# Patient Record
Sex: Male | Born: 1950
Health system: Southern US, Community
[De-identification: ages and names within clinical notes are randomized; demographics above are authoritative.]

## PROBLEM LIST (undated history)

## (undated) DIAGNOSIS — C801 Malignant (primary) neoplasm, unspecified: Secondary | ICD-10-CM

## (undated) DIAGNOSIS — M272 Inflammatory conditions of jaws: Secondary | ICD-10-CM

## (undated) DIAGNOSIS — R682 Dry mouth, unspecified: Secondary | ICD-10-CM

## (undated) DIAGNOSIS — K219 Gastro-esophageal reflux disease without esophagitis: Secondary | ICD-10-CM

## (undated) DIAGNOSIS — F32A Depression, unspecified: Secondary | ICD-10-CM

## (undated) DIAGNOSIS — R39198 Other difficulties with micturition: Secondary | ICD-10-CM

## (undated) DIAGNOSIS — F329 Major depressive disorder, single episode, unspecified: Secondary | ICD-10-CM

## (undated) DIAGNOSIS — M4302 Spondylolysis, cervical region: Secondary | ICD-10-CM

## (undated) DIAGNOSIS — R21 Rash and other nonspecific skin eruption: Secondary | ICD-10-CM

## (undated) DIAGNOSIS — G8929 Other chronic pain: Secondary | ICD-10-CM

## (undated) DIAGNOSIS — E1165 Type 2 diabetes mellitus with hyperglycemia: Secondary | ICD-10-CM

## (undated) DIAGNOSIS — N390 Urinary tract infection, site not specified: Secondary | ICD-10-CM

## (undated) DIAGNOSIS — R499 Unspecified voice and resonance disorder: Secondary | ICD-10-CM

## (undated) DIAGNOSIS — I10 Essential (primary) hypertension: Secondary | ICD-10-CM

## (undated) DIAGNOSIS — T7840XA Allergy, unspecified, initial encounter: Secondary | ICD-10-CM

## (undated) DIAGNOSIS — R1310 Dysphagia, unspecified: Secondary | ICD-10-CM

## (undated) DIAGNOSIS — R7302 Impaired glucose tolerance (oral): Secondary | ICD-10-CM

## (undated) DIAGNOSIS — M47812 Spondylosis without myelopathy or radiculopathy, cervical region: Secondary | ICD-10-CM

## (undated) DIAGNOSIS — IMO0002 Reserved for concepts with insufficient information to code with codable children: Secondary | ICD-10-CM

## (undated) DIAGNOSIS — Y842 Radiological procedure and radiotherapy as the cause of abnormal reaction of the patient, or of later complication, without mention of misadventure at the time of the procedure: Secondary | ICD-10-CM

## (undated) HISTORY — DX: Reserved for concepts with insufficient information to code with codable children: IMO0002

## (undated) HISTORY — PX: GASTROSTOMY TUBE PLACEMENT: SHX655

## (undated) HISTORY — DX: Type 2 diabetes mellitus with hyperglycemia: E11.65

## (undated) HISTORY — DX: Impaired glucose tolerance (oral): R73.02

## (undated) HISTORY — DX: Rash and other nonspecific skin eruption: R21

## (undated) HISTORY — DX: Allergy, unspecified, initial encounter: T78.40XA

## (undated) HISTORY — DX: Malignant (primary) neoplasm, unspecified: C80.1

## (undated) HISTORY — DX: Essential (primary) hypertension: I10

## (undated) HISTORY — DX: Urinary tract infection, site not specified: N39.0

## (undated) HISTORY — DX: Gastro-esophageal reflux disease without esophagitis: K21.9

## (undated) HISTORY — DX: Radiological procedure and radiotherapy as the cause of abnormal reaction of the patient, or of later complication, without mention of misadventure at the time of the procedure: Y84.2

## (undated) HISTORY — DX: Dry mouth, unspecified: R68.2

## (undated) HISTORY — DX: Depression, unspecified: F32.A

## (undated) HISTORY — DX: Spondylolysis, cervical region: M43.02

## (undated) HISTORY — DX: Other chronic pain: G89.29

## (undated) HISTORY — DX: Unspecified voice and resonance disorder: R49.9

## (undated) HISTORY — DX: Major depressive disorder, single episode, unspecified: F32.9

## (undated) HISTORY — DX: Other difficulties with micturition: R39.198

## (undated) HISTORY — DX: Inflammatory conditions of jaws: M27.2

## (undated) HISTORY — DX: Dysphagia, unspecified: R13.10

## (undated) HISTORY — DX: Spondylosis without myelopathy or radiculopathy, cervical region: M47.812

---

## 1997-02-13 DIAGNOSIS — C801 Malignant (primary) neoplasm, unspecified: Secondary | ICD-10-CM

## 1997-02-13 HISTORY — DX: Malignant (primary) neoplasm, unspecified: C80.1

## 1999-11-14 HISTORY — PX: TRACHEOSTOMY: SUR1362

## 1999-11-14 HISTORY — PX: PEG TUBE PLACEMENT: SUR1034

## 1999-11-24 ENCOUNTER — Emergency Department (HOSPITAL_COMMUNITY): Admission: EM | Admit: 1999-11-24 | Discharge: 1999-11-25 | Payer: Self-pay | Admitting: Emergency Medicine

## 1999-11-26 ENCOUNTER — Emergency Department (HOSPITAL_COMMUNITY): Admission: EM | Admit: 1999-11-26 | Discharge: 1999-11-26 | Payer: Self-pay | Admitting: Internal Medicine

## 1999-12-06 ENCOUNTER — Inpatient Hospital Stay (HOSPITAL_COMMUNITY): Admission: EM | Admit: 1999-12-06 | Discharge: 1999-12-20 | Payer: Self-pay | Admitting: Emergency Medicine

## 1999-12-06 ENCOUNTER — Encounter (INDEPENDENT_AMBULATORY_CARE_PROVIDER_SITE_OTHER): Payer: Self-pay | Admitting: Specialist

## 1999-12-06 ENCOUNTER — Encounter: Payer: Self-pay | Admitting: Otolaryngology

## 1999-12-08 ENCOUNTER — Encounter: Payer: Self-pay | Admitting: Otolaryngology

## 1999-12-11 ENCOUNTER — Encounter: Payer: Self-pay | Admitting: Otolaryngology

## 1999-12-15 ENCOUNTER — Encounter: Payer: Self-pay | Admitting: Otolaryngology

## 2000-01-23 ENCOUNTER — Encounter: Payer: Self-pay | Admitting: Otolaryngology

## 2000-01-23 ENCOUNTER — Ambulatory Visit (HOSPITAL_COMMUNITY): Admission: RE | Admit: 2000-01-23 | Discharge: 2000-01-23 | Payer: Self-pay | Admitting: Otolaryngology

## 2000-06-18 ENCOUNTER — Emergency Department (HOSPITAL_COMMUNITY): Admission: EM | Admit: 2000-06-18 | Discharge: 2000-06-19 | Payer: Self-pay | Admitting: Emergency Medicine

## 2000-09-28 ENCOUNTER — Encounter: Admission: RE | Admit: 2000-09-28 | Discharge: 2000-09-28 | Payer: Self-pay | Admitting: Otolaryngology

## 2000-09-28 ENCOUNTER — Encounter: Payer: Self-pay | Admitting: Otolaryngology

## 2000-11-15 ENCOUNTER — Encounter (INDEPENDENT_AMBULATORY_CARE_PROVIDER_SITE_OTHER): Payer: Self-pay | Admitting: Specialist

## 2000-11-15 ENCOUNTER — Ambulatory Visit (HOSPITAL_BASED_OUTPATIENT_CLINIC_OR_DEPARTMENT_OTHER): Admission: RE | Admit: 2000-11-15 | Discharge: 2000-11-15 | Payer: Self-pay | Admitting: General Surgery

## 2000-11-22 ENCOUNTER — Emergency Department (HOSPITAL_COMMUNITY): Admission: EM | Admit: 2000-11-22 | Discharge: 2000-11-22 | Payer: Self-pay | Admitting: Emergency Medicine

## 2000-11-22 ENCOUNTER — Emergency Department (HOSPITAL_COMMUNITY): Admission: EM | Admit: 2000-11-22 | Discharge: 2000-11-22 | Payer: Self-pay

## 2001-05-22 ENCOUNTER — Emergency Department (HOSPITAL_COMMUNITY): Admission: EM | Admit: 2001-05-22 | Discharge: 2001-05-22 | Payer: Self-pay | Admitting: Emergency Medicine

## 2001-07-09 ENCOUNTER — Ambulatory Visit (HOSPITAL_COMMUNITY): Admission: RE | Admit: 2001-07-09 | Discharge: 2001-07-09 | Payer: Self-pay | Admitting: Pulmonary Disease

## 2001-07-09 ENCOUNTER — Encounter: Payer: Self-pay | Admitting: Pulmonary Disease

## 2001-09-03 ENCOUNTER — Ambulatory Visit (HOSPITAL_COMMUNITY): Admission: RE | Admit: 2001-09-03 | Discharge: 2001-09-03 | Payer: Self-pay | Admitting: Gastroenterology

## 2001-09-03 ENCOUNTER — Encounter: Payer: Self-pay | Admitting: Gastroenterology

## 2001-11-20 ENCOUNTER — Ambulatory Visit (HOSPITAL_COMMUNITY): Admission: RE | Admit: 2001-11-20 | Discharge: 2001-11-20 | Payer: Self-pay | Admitting: Otolaryngology

## 2001-11-20 ENCOUNTER — Encounter: Payer: Self-pay | Admitting: Otolaryngology

## 2001-12-07 ENCOUNTER — Emergency Department (HOSPITAL_COMMUNITY): Admission: EM | Admit: 2001-12-07 | Discharge: 2001-12-07 | Payer: Self-pay | Admitting: Emergency Medicine

## 2002-01-10 ENCOUNTER — Emergency Department (HOSPITAL_COMMUNITY): Admission: EM | Admit: 2002-01-10 | Discharge: 2002-01-10 | Payer: Self-pay | Admitting: Emergency Medicine

## 2002-01-10 ENCOUNTER — Encounter: Payer: Self-pay | Admitting: Emergency Medicine

## 2002-01-16 ENCOUNTER — Ambulatory Visit (HOSPITAL_COMMUNITY): Admission: RE | Admit: 2002-01-16 | Discharge: 2002-01-16 | Payer: Self-pay | Admitting: Gastroenterology

## 2002-03-15 ENCOUNTER — Emergency Department (HOSPITAL_COMMUNITY): Admission: EM | Admit: 2002-03-15 | Discharge: 2002-03-15 | Payer: Self-pay | Admitting: *Deleted

## 2002-03-28 ENCOUNTER — Ambulatory Visit (HOSPITAL_COMMUNITY): Admission: RE | Admit: 2002-03-28 | Discharge: 2002-03-28 | Payer: Self-pay | Admitting: Otolaryngology

## 2002-03-28 ENCOUNTER — Encounter: Payer: Self-pay | Admitting: Otolaryngology

## 2002-05-08 ENCOUNTER — Encounter: Payer: Self-pay | Admitting: Emergency Medicine

## 2002-05-08 ENCOUNTER — Emergency Department (HOSPITAL_COMMUNITY): Admission: EM | Admit: 2002-05-08 | Discharge: 2002-05-08 | Payer: Self-pay | Admitting: Emergency Medicine

## 2002-05-10 ENCOUNTER — Emergency Department (HOSPITAL_COMMUNITY): Admission: EM | Admit: 2002-05-10 | Discharge: 2002-05-10 | Payer: Self-pay | Admitting: Emergency Medicine

## 2002-05-15 ENCOUNTER — Ambulatory Visit (HOSPITAL_COMMUNITY): Admission: RE | Admit: 2002-05-15 | Discharge: 2002-05-15 | Payer: Self-pay | Admitting: Gastroenterology

## 2002-05-16 ENCOUNTER — Emergency Department (HOSPITAL_COMMUNITY): Admission: EM | Admit: 2002-05-16 | Discharge: 2002-05-16 | Payer: Self-pay | Admitting: Emergency Medicine

## 2002-05-20 ENCOUNTER — Encounter: Admission: RE | Admit: 2002-05-20 | Discharge: 2002-05-20 | Payer: Self-pay | Admitting: Gastroenterology

## 2002-05-20 ENCOUNTER — Encounter: Payer: Self-pay | Admitting: Gastroenterology

## 2002-06-18 ENCOUNTER — Ambulatory Visit (HOSPITAL_COMMUNITY): Admission: RE | Admit: 2002-06-18 | Discharge: 2002-06-18 | Payer: Self-pay | Admitting: General Surgery

## 2002-08-17 ENCOUNTER — Emergency Department (HOSPITAL_COMMUNITY): Admission: EM | Admit: 2002-08-17 | Discharge: 2002-08-17 | Payer: Self-pay | Admitting: Emergency Medicine

## 2002-08-18 ENCOUNTER — Emergency Department (HOSPITAL_COMMUNITY): Admission: AD | Admit: 2002-08-18 | Discharge: 2002-08-18 | Payer: Self-pay | Admitting: Emergency Medicine

## 2002-08-18 ENCOUNTER — Encounter: Payer: Self-pay | Admitting: Emergency Medicine

## 2002-12-03 ENCOUNTER — Emergency Department (HOSPITAL_COMMUNITY): Admission: EM | Admit: 2002-12-03 | Discharge: 2002-12-04 | Payer: Self-pay | Admitting: Emergency Medicine

## 2002-12-06 ENCOUNTER — Emergency Department (HOSPITAL_COMMUNITY): Admission: EM | Admit: 2002-12-06 | Discharge: 2002-12-06 | Payer: Self-pay | Admitting: Emergency Medicine

## 2002-12-06 ENCOUNTER — Encounter: Payer: Self-pay | Admitting: Emergency Medicine

## 2003-01-06 ENCOUNTER — Encounter: Admission: RE | Admit: 2003-01-06 | Discharge: 2003-01-06 | Payer: Self-pay | Admitting: Otolaryngology

## 2003-06-28 ENCOUNTER — Emergency Department (HOSPITAL_COMMUNITY): Admission: EM | Admit: 2003-06-28 | Discharge: 2003-06-28 | Payer: Self-pay | Admitting: Emergency Medicine

## 2003-10-12 ENCOUNTER — Emergency Department (HOSPITAL_COMMUNITY): Admission: EM | Admit: 2003-10-12 | Discharge: 2003-10-12 | Payer: Self-pay | Admitting: Emergency Medicine

## 2004-06-06 ENCOUNTER — Encounter: Admission: RE | Admit: 2004-06-06 | Discharge: 2004-06-06 | Payer: Self-pay | Admitting: Otolaryngology

## 2004-08-09 ENCOUNTER — Encounter (HOSPITAL_BASED_OUTPATIENT_CLINIC_OR_DEPARTMENT_OTHER): Admission: RE | Admit: 2004-08-09 | Discharge: 2004-11-07 | Payer: Self-pay | Admitting: Surgery

## 2004-11-13 ENCOUNTER — Emergency Department (HOSPITAL_COMMUNITY): Admission: EM | Admit: 2004-11-13 | Discharge: 2004-11-13 | Payer: Self-pay | Admitting: Emergency Medicine

## 2004-11-16 ENCOUNTER — Encounter (HOSPITAL_BASED_OUTPATIENT_CLINIC_OR_DEPARTMENT_OTHER): Admission: RE | Admit: 2004-11-16 | Discharge: 2004-12-12 | Payer: Self-pay | Admitting: Surgery

## 2006-04-22 ENCOUNTER — Emergency Department (HOSPITAL_COMMUNITY): Admission: EM | Admit: 2006-04-22 | Discharge: 2006-04-22 | Payer: Self-pay | Admitting: Emergency Medicine

## 2006-04-23 ENCOUNTER — Emergency Department (HOSPITAL_COMMUNITY): Admission: EM | Admit: 2006-04-23 | Discharge: 2006-04-23 | Payer: Self-pay | Admitting: Emergency Medicine

## 2006-05-06 ENCOUNTER — Emergency Department (HOSPITAL_COMMUNITY): Admission: EM | Admit: 2006-05-06 | Discharge: 2006-05-06 | Payer: Self-pay | Admitting: Emergency Medicine

## 2006-10-01 ENCOUNTER — Encounter (HOSPITAL_BASED_OUTPATIENT_CLINIC_OR_DEPARTMENT_OTHER): Admission: RE | Admit: 2006-10-01 | Discharge: 2006-12-30 | Payer: Self-pay | Admitting: Surgery

## 2007-04-30 ENCOUNTER — Encounter (HOSPITAL_BASED_OUTPATIENT_CLINIC_OR_DEPARTMENT_OTHER): Admission: RE | Admit: 2007-04-30 | Discharge: 2007-07-29 | Payer: Self-pay | Admitting: Surgery

## 2007-10-29 ENCOUNTER — Encounter: Admission: RE | Admit: 2007-10-29 | Discharge: 2007-10-29 | Payer: Self-pay | Admitting: *Deleted

## 2007-11-28 ENCOUNTER — Emergency Department (HOSPITAL_COMMUNITY): Admission: EM | Admit: 2007-11-28 | Discharge: 2007-11-28 | Payer: Self-pay | Admitting: Family Medicine

## 2008-06-24 ENCOUNTER — Emergency Department (HOSPITAL_COMMUNITY): Admission: EM | Admit: 2008-06-24 | Discharge: 2008-06-24 | Payer: Self-pay | Admitting: Emergency Medicine

## 2008-06-26 ENCOUNTER — Emergency Department (HOSPITAL_COMMUNITY): Admission: EM | Admit: 2008-06-26 | Discharge: 2008-06-27 | Payer: Self-pay | Admitting: Emergency Medicine

## 2008-09-29 ENCOUNTER — Encounter: Admission: RE | Admit: 2008-09-29 | Discharge: 2008-09-29 | Payer: Self-pay | Admitting: Family Medicine

## 2009-04-13 ENCOUNTER — Emergency Department (HOSPITAL_COMMUNITY): Admission: EM | Admit: 2009-04-13 | Discharge: 2009-04-13 | Payer: Self-pay | Admitting: Family Medicine

## 2009-08-24 ENCOUNTER — Emergency Department (HOSPITAL_COMMUNITY): Admission: EM | Admit: 2009-08-24 | Discharge: 2009-08-24 | Payer: Self-pay | Admitting: Emergency Medicine

## 2009-08-25 ENCOUNTER — Encounter: Admission: RE | Admit: 2009-08-25 | Discharge: 2009-08-25 | Payer: Self-pay | Admitting: Family Medicine

## 2009-08-31 ENCOUNTER — Emergency Department (HOSPITAL_COMMUNITY): Admission: EM | Admit: 2009-08-31 | Discharge: 2009-08-31 | Payer: Self-pay | Admitting: Family Medicine

## 2009-09-21 ENCOUNTER — Emergency Department (HOSPITAL_COMMUNITY): Admission: EM | Admit: 2009-09-21 | Discharge: 2009-09-21 | Payer: Self-pay | Admitting: Emergency Medicine

## 2009-11-16 ENCOUNTER — Emergency Department (HOSPITAL_COMMUNITY)
Admission: EM | Admit: 2009-11-16 | Discharge: 2009-11-16 | Payer: Self-pay | Source: Home / Self Care | Admitting: Emergency Medicine

## 2009-11-17 ENCOUNTER — Emergency Department (HOSPITAL_COMMUNITY): Admission: EM | Admit: 2009-11-17 | Discharge: 2009-11-17 | Payer: Self-pay | Admitting: Emergency Medicine

## 2009-11-17 ENCOUNTER — Emergency Department (HOSPITAL_COMMUNITY): Admission: EM | Admit: 2009-11-17 | Discharge: 2009-11-18 | Payer: Self-pay | Admitting: Emergency Medicine

## 2009-11-18 ENCOUNTER — Ambulatory Visit (HOSPITAL_COMMUNITY): Admission: RE | Admit: 2009-11-18 | Discharge: 2009-11-18 | Payer: Self-pay | Admitting: Interventional Radiology

## 2010-03-12 ENCOUNTER — Emergency Department (HOSPITAL_COMMUNITY)
Admission: EM | Admit: 2010-03-12 | Discharge: 2010-03-12 | Payer: Self-pay | Source: Home / Self Care | Admitting: Emergency Medicine

## 2010-04-27 LAB — DIFFERENTIAL
Basophils Absolute: 0 10*3/uL (ref 0.0–0.1)
Basophils Relative: 0 % (ref 0–1)
Eosinophils Absolute: 0.1 10*3/uL (ref 0.0–0.7)
Eosinophils Relative: 2 % (ref 0–5)
Lymphocytes Relative: 29 % (ref 12–46)
Lymphs Abs: 1.7 10*3/uL (ref 0.7–4.0)
Monocytes Absolute: 0.9 10*3/uL (ref 0.1–1.0)
Monocytes Relative: 15 % — ABNORMAL HIGH (ref 3–12)
Neutro Abs: 3.1 10*3/uL (ref 1.7–7.7)
Neutrophils Relative %: 53 % (ref 43–77)

## 2010-04-27 LAB — CBC
HCT: 43.6 % (ref 39.0–52.0)
Hemoglobin: 15.1 g/dL (ref 13.0–17.0)
MCH: 32 pg (ref 26.0–34.0)
MCHC: 34.6 g/dL (ref 30.0–36.0)
MCV: 92.4 fL (ref 78.0–100.0)
Platelets: 238 10*3/uL (ref 150–400)
RBC: 4.72 MIL/uL (ref 4.22–5.81)
RDW: 12.7 % (ref 11.5–15.5)
WBC: 5.9 10*3/uL (ref 4.0–10.5)

## 2010-04-27 LAB — URINE MICROSCOPIC-ADD ON

## 2010-04-27 LAB — URINALYSIS, ROUTINE W REFLEX MICROSCOPIC
Bilirubin Urine: NEGATIVE
Glucose, UA: NEGATIVE mg/dL
Hgb urine dipstick: NEGATIVE
Ketones, ur: NEGATIVE mg/dL
Nitrite: NEGATIVE
Protein, ur: NEGATIVE mg/dL
Specific Gravity, Urine: 1.024 (ref 1.005–1.030)
Urobilinogen, UA: 0.2 mg/dL (ref 0.0–1.0)
pH: 6 (ref 5.0–8.0)

## 2010-04-27 LAB — POCT I-STAT, CHEM 8
BUN: 18 mg/dL (ref 6–23)
Calcium, Ion: 1.04 mmol/L — ABNORMAL LOW (ref 1.12–1.32)
Chloride: 98 mEq/L (ref 96–112)
Creatinine, Ser: 1.1 mg/dL (ref 0.4–1.5)
Glucose, Bld: 117 mg/dL — ABNORMAL HIGH (ref 70–99)
HCT: 48 % (ref 39.0–52.0)
Hemoglobin: 16.3 g/dL (ref 13.0–17.0)
Potassium: 3.9 mEq/L (ref 3.5–5.1)
Sodium: 135 mEq/L (ref 135–145)
TCO2: 30 mmol/L (ref 0–100)

## 2010-04-30 LAB — POCT I-STAT, CHEM 8
BUN: 18 mg/dL (ref 6–23)
Calcium, Ion: 1.17 mmol/L (ref 1.12–1.32)
Chloride: 96 mEq/L (ref 96–112)
Creatinine, Ser: 0.9 mg/dL (ref 0.4–1.5)
Glucose, Bld: 119 mg/dL — ABNORMAL HIGH (ref 70–99)
HCT: 46 % (ref 39.0–52.0)
Hemoglobin: 15.6 g/dL (ref 13.0–17.0)
Potassium: 3.8 mEq/L (ref 3.5–5.1)
Sodium: 136 mEq/L (ref 135–145)
TCO2: 32 mmol/L (ref 0–100)

## 2010-05-06 ENCOUNTER — Other Ambulatory Visit: Payer: Self-pay | Admitting: Family Medicine

## 2010-05-06 ENCOUNTER — Ambulatory Visit
Admission: RE | Admit: 2010-05-06 | Discharge: 2010-05-06 | Disposition: A | Discharge status: Home / Self Care | Payer: Medicare Other | Source: Ambulatory Visit | Attending: Family Medicine | Admitting: Family Medicine

## 2010-05-06 DIAGNOSIS — Z85819 Personal history of malignant neoplasm of unspecified site of lip, oral cavity, and pharynx: Secondary | ICD-10-CM

## 2010-05-06 DIAGNOSIS — Z931 Gastrostomy status: Secondary | ICD-10-CM

## 2010-06-28 NOTE — Consult Note (Signed)
Nicholas Cobb, Nicholas Cobb             ACCOUNT NO.:  [id number]   MEDICAL RECORD NO.:  [id number]          PATIENT TYPE:  REC   LOCATION:  FOOT                         FACILITY:  [facility]   PHYSICIAN:  Jonelle Sports. Sevier, M.D. DATE OF BIRTH:  02/29/1950   DATE OF CONSULTATION:  05/01/2007  DATE OF DISCHARGE:                                 CONSULTATION   HISTORY:  This 60 year old black male is referred by Dr. Renaye Rakers for  consideration of course of hyperbaric oxygen therapy.   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  This was followed  by extensive radiation therapy and tracheotomy of course.   He was seen here once approximately 5 years ago at which time he  developed an abscess in his jaw in relationship to some  osteoradionecrosis of the mandible.  He did undergo a course of  hyperbaric oxygen therapy with relief of that problem.   He was referred back here in August 2008 for rather nonspecific reasons  and at that time he was evaluated by Dr. Tanda Rockers who found no basis for  another course of hyperbaric oxygen therapy.   Since then, the patient has been seen apparently had [facility] and has  been told that he will need to hyperbaric oxygen therapy for the rest  his life.  His concern at the moment is some drainage around the  tracheostomy tube at the site of considerable radiation scarring.   EXAMINATION:  Blood pressure 133/73, pulse 70, respirations 18,  temperature 99.2.  The the patient indeed does have a small amount of drainage which looks  mucopurulent coming from around the margins of the tracheotomy tube.  He  absolutely refuses to let me loosen that tube enough to get a better  look at the area, as he says that without proper instruments I would not  be able to reinsert it, and that it might do him further damage.  There  is induration in the area but no fluctuance.  There is no local  heat or  new swelling that I can appreciate.  I am unable to get a satisfactory  evaluation of his intraoral cavity.   IMPRESSION:  Tracheostomy site in an gentleman with prior history of  radiation.   With current level of examination, which he will allow, I am totally  unable to assess what is going on.  The low grade temperature is a  matter some concern.   DISPOSITION:  My plan will be to refer this gentleman to local ENT for  their evaluation to see if there is evidence for infection or recurrent  tumor or anything else that might indicate need for more direct  treatment and also for their opinion as to whether course of hyperbaric  oxygen therapy would likely benefit him in that present problem seems  primarily related to old radiation injury.   Accordingly, arrange arrangements have been made for consultation with  [facility] for this purpose, and following the report we  will contact the patient and arrange  as appropriate return visit here.           ______________________________  Jonelle Sports. Cheryll Cockayne, M.D.     RES/MEDQ  D:  05/01/2007  T:  05/01/2007  Job:  [id number]   cc:   Renaye Rakers, M.D.  [facility]

## 2010-06-28 NOTE — Consult Note (Signed)
Nicholas Cobb, Nicholas Cobb             ACCOUNT NO.:  [id number]   MEDICAL RECORD NO.:  [id number]          PATIENT TYPE:  REC   LOCATION:  FOOT                         FACILITY:  [facility]   PHYSICIAN:  Theresia Majors. Tanda Rockers, M.D.DATE OF BIRTH:  11-24-1950   DATE OF CONSULTATION:  10/01/2006  DATE OF DISCHARGE:                                 CONSULTATION   REASON FOR CONSULTATION:  Mr. Albanese is a 60 year old man referred by  Dr. Parke Simmers   REASON FOR REFERRAL:  The patient has tracheotomy because of cancer of  the jaw.   IMPRESSION:  Post radiation changes , permanent tracheostomy, status  post effective hyperbaric oxygen treatment for osteoradionecrosis   RECOMMENDATIONS:  Will continue routine followup with otolaryngology for  management of xerosis and constitutional symptoms related to long-term  complications of radical neck surgery and radiation.   SUBJECTIVE:  Mr. Cush is a 60 year old man who was last seen in the  wound center in October of 2006. In fact, he was discharged November 13, 2004, after completion of a total of 51 hyperbaric oxygen treatments for  delayed complications of radiation.  He has been seen by multiple  physicians in the interim, most recently was seen by Dr. Delford Field from the  otolaryngology service at [facility].  He has recently complained  of mucous production and swelling in the face along with ringing in the  right ear. He apparently was treated with a course of antibiotics by Dr.  Parke Simmers and had significant improvement of hemoptysis.  He requested  referral to the wound center for possible repeat of his hyperbaric  oxygen treatment.   OBJECTIVE:  VITAL SIGNS:  The patient's weight is 160 pounds.  He is 5  feet 10 inches tall.  Blood pressure is 98/71, respirations 18, pulse  rate 96, temperature 99.8.  HEENT/NECK:  The exam is remarkable for the postsurgical changes of  bilateral radical neck mastectomy.  The previous area of sinus drainage  of  osteoradionecrosis is completely resolved.  There is no fluctuance.  I do not appreciate a frank localized edematous process.  On the oral  exam, there is marked contracture limiting the exam of the oropharynx.  The mucosa. however, appears to be normal with normal moisture. There is  no drooling. There is no hint of ulceration.  The right tympanic  membrane is occluded by a firm impacted wax.  The nostrils are clear.  The left external auditory canal and ear drum are unremarkable   ASSESSMENT:  No acute or chronic wound.   PLAN:  We have reassured the patient that I see no evidence of or so  source that would  be an indication for the use of hyperbaric oxygen.  I  have encouraged him to keep his followup with Dr. Parke Simmers and in addition  recommended that the otolaryngology evacuate the wax from the right  external auditory canal.  This may relieve his symptoms of ringing in  the ear.  We have given the patient an opportunity to ask questions.  He  seems to understand clearly that,  in the absence of and  a wound, there is no indication for hyperbaric  oxygen.  He indicates that he will accept the recommendations to be  followed up by Dr. Delford Field from otolaryngology at [facility] and  will continue his routine primary care per Dr. Parke Simmers.  He expresses  gratitude for having been seen in the clinic.      Harold A. Tanda Rockers, M.D.  Electronically Signed     HAN/MEDQ  D:  10/02/2006  T:  10/03/2006  Job:  [id number]

## 2010-07-01 NOTE — Op Note (Signed)
[facility]  Patient:    Nicholas Cobb, Nicholas Cobb                    MRN: [id number] Proc. Date: 12/06/99 Adm. Date:  [id number] Attending:  Lucky Cowboy CC:         [facility]   Operative Report  PREOPERATIVE DIAGNOSIS:  Airway obstruction.  POSTOPERATIVE DIAGNOSIS:  Airway obstruction.  PROCEDURE:  Emergent tracheotomy.  SURGEON:  Lucky Cowboy, M.D.  ANESTHESIA:  Local.  ESTIMATED BLOOD LOSS:  30 cc.  SPECIMENS:  None.  COMPLICATIONS:  None.  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  He has had dyspnea over the past two weeks and has been treated with prednisone.  He presented with a few-hour history tonight of increasing airway obstruction.  He was evaluated in the emergency room and found to be in extreme distress with biphasic stridor.  For this reason, he is taken to the operating room for emergent awake tracheotomy.  FINDINGS:  The patient was noted to have dense fibrotic change within the neck consistent with previous radiation therapy.  The patients tracheotomy was placed between rings one and two with a stay suture being applied around ring two.  A #6 cuffed Shiley was placed.  DESCRIPTION OF PROCEDURE:  The patient was taken to the operating room and placed on the table in a bench chair-type position.  The neck was then prepped and draped in the usual sterile fashion.  Lidocaine 1% with 1:100,000 of epinephrine was then used to inject the skin and subcutaneous tissues.  A midline vertical incision was made using a #15 blade and Bovie cautery used to divide the subcutaneous tissues, strap muscles, and thyroid isthmus.  At this point, the trachea was visualized.  A 15 blade was used to make a transverse incision between rings one and two.  A trach spreader was used to open up the incision.  A 0 silk  stay suture was placed around the second tracheal ring.  A Shiley #6 trach was then placed in the trachea and the cuff inflated.  Breath sounds were equal, and CO2 was exchanged.  The trach was then secured to the neck in four separate locations to the flange in simple interrupted fashion using 0 silk.  A Velcro trach tie was applied. The patient was then taken to the recovery room in stable condition.  There were no complications. DD:  12/06/99 TD:  12/06/99 Job: [id number]

## 2010-07-01 NOTE — Consult Note (Signed)
NAMEJAVONI, LUCKEN              ACCOUNT NO.:  [id number]   MEDICAL RECORD NO.:  [id number]          PATIENT TYPE:   LOCATION:                                 FACILITY:   PHYSICIAN:  Theresia Majors. Tanda Rockers, M.D.     DATE OF BIRTH:   DATE OF CONSULTATION:  DATE OF DISCHARGE:                                   CONSULTATION   CORRECTION TO ORIGINAL DICTATION OF November 05, 2004:  Job number [id number].  This consultation contained an error. Instead of  hypobaric oxygen  this is high hyperbaric oxygen. The consultation as  follows:   Hyperbaric oxygen completion record: Mr. Voris is a 60 year old man who  initiated hyperbaric treatment on September 13, 2004. He underwent a total of 35  dives at 2 atmospheres of pressure for 90 minutes. He required PE tubes to  be placed bilaterally by Dr. Ezzard Standing four TEENS.  1.  Baric trauma.   At the time of this dictation, his presenting lesion on the right mandible  has shown dramatic improvement. We are discontinuing his HBO treatment  pending reevaluation by his otolaryngologist and radiation therapist.           ______________________________  Theresia Majors. Tanda Rockers, M.D.     [name]  D:  11/08/2004  T:  11/09/2004  Job:  [id number]   cc:   Kristine Garbe. Ezzard Standing, M.D.  Fax: [phone]   Mina Marble, M.D.  Fax: [phone]

## 2010-07-01 NOTE — Discharge Summary (Signed)
[facility]  Patient:    Nicholas Cobb, Nicholas Cobb                    MRN: [id number] Adm. Date:  [id number] Disc. Date: 12/20/99 Attending:  Lucky Cowboy                           Discharge Summary  ADMISSION DIAGNOSIS:  Respiratory distress.  DISCHARGE DIAGNOSES 1. Status post tracheostomy. 2. Status post incision and drainage of neck abscess. 3. 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  HISTORY:  This is a 60 year old gentleman who underwent extensive radiotherapy, external beam, about two years prior in [place], [place] for an unknown primary with a metastasis to the neck; he underwent a right neck dissection as well.  These were both performed in [place].  He recently moved up to [facility] and has had chronic swelling and fibrosis of the face and neck and severe trismus ever since.  He had never been evaluated by an oral surgeon or dentist prior to the radiation therapy.  HOSPITAL COURSE:  He was admitted to the hospital on December 06, 1999 in acute respiratory distress and underwent emergent tracheostomy.  He then underwent a CT scan of the neck on October 24th, where a small abscess was identified in the right upper neck area.  On October 25th, he underwent incision and drainage of the neck abscess; he also had a PICC line inserted at that time and was started on parenteral nutrition.  General surgery was consulted on November 1st, and he underwent a gastrostomy tube placement on November 2nd; this was uneventful.  On November 1st, oral surgery was consulted, Dr. Dorthula Matas, and we had a discussion about the possible disposition for [name].  He was found on Panorex to have a lucency and severe periodontal disease around the posterior lower molars on the right side.  The choices for therapy included extraction of these teeth, although there  is significant concern for possible osteonecrosis of the mandible following these procedures, and we had decided that it would make sense to have him evaluated at [facility] for possible hyperbaric oxygen therapy.  DISPOSITION:  He is to be discharged to nursing home on November 6th for continued care with gastrostomy tube feeding and tracheostomy care.  FOLLOWUP:  He has an appointment at the head and neck clinic at [facility] on November 9th for evaluation of treatment of the mandible and possible hyperbaric oxygen therapy.  He is to follow up with me on an as-needed basis, pending the evaluation at [facility].  DISCHARGE MEDICATIONS:  Prescriptions were left for his pain medication and antibiotic medication. DD:  12/20/99 TD:  12/20/99 Job: [id number]

## 2010-07-01 NOTE — Op Note (Signed)
[place]  Patient:    Nicholas Cobb, Nicholas Cobb                    MRN: [id number] Proc. Date: 01/23/00 Adm. Date:  [id number] Attending:  Serena Colonel H                           Operative Report  PREOPERATIVE DIAGNOSIS:  Eustachian tube dysfunction.  Osteoradionecrosis of the mandible.  POSTOPERATIVE DIAGNOSIS:  Eustachian tube dysfunction.  Osteoradionecrosis of the mandible.  OPERATION PERFORMED:  Bilateral myringotomy and tubes.  SURGEON:  Jefry H. Pollyann Kennedy, M.D.  ANESTHESIA:  General endotracheal.  COMPLICATIONS:  None.  FINDINGS:  Middle ear effusion on the right side with a cheesy external auditory canal exudate and external otitis as well.  INDICATIONS FOR PROCEDURE:  This 60 year old was treated with radiotherapy two and a half years ago with what appears to be osteoradionecrosis.  He was being treated at [facility] with hyperbaric oxygen therapy and was having severe difficulty with pain upon pressurization.   The risks, benefits, alternatives and complications of the procedure were explained to the patient, who seemed to understand and agreed to surgery.  DESCRIPTION OF PROCEDURE:  The patient was taken to the operating room and placed on the operating table in the supine position.  Following induction of general endotracheal anesthesia via the tracheostomy, the ears were examined using the operating microscope and cleaned of cerumen.  Anterior inferior myringotomy incisions were created.  The left ear was clear.  The right ear had serous effusion in the middle ear that was aspirated.  Sheehy tubes were placed without difficulty.  The right external auditory canal was also cleaned of some cheesy exudate.  Cortisporin was dripped into the right ear canal.  A cotton ball was placed on the right side.  The patient was then awakened, extubated and transferred to recovery in stable condition. DD:  01/23/00 TD:   01/23/00 Job: [id number]

## 2010-07-01 NOTE — Op Note (Signed)
   NAME:  Nicholas Cobb, Nicholas Cobb                       ACCOUNT NO.:  [id number]   MEDICAL RECORD NO.:  [id number]                   PATIENT TYPE:  AMB   LOCATION:  ENDO                                 FACILITY:  [facility]   PHYSICIAN:  Danise Edge, M.D.                DATE OF BIRTH:  Jul 08, 1950   DATE OF PROCEDURE:  05/15/2002  DATE OF DISCHARGE:                                 OPERATIVE REPORT   PROCEDURE PERFORMED:  Percutaneous endoscopic gastrostomy tube replacement.   INDICATIONS FOR PROCEDURE:  The patient is a 60 year old male, born 16-May-1950.  The patient has undergone surgery for head and neck cancer.  He  has a chronic percutaneously placed gastrostomy tube which is leaking and  requires replacement.   DESCRIPTION OF PROCEDURE:  The Ross gastrostomy tube balloon was deflated  and the tube removed.  It was replaced with a 20 [place]ky nonballoon  replacement gastrostomy tube.  Sedation was not required.                                               Danise Edge, M.D.    MJ/MEDQ  D:  05/15/2002  T:  05/15/2002  Job:  [id number]

## 2010-07-01 NOTE — Consult Note (Signed)
Nicholas Cobb, Nicholas NO.:  [id number]   MEDICAL RECORD NO.:  [id number]          PATIENT TYPE:  EMS   LOCATION:  ED                           FACILITY:  [facility]   PHYSICIAN:  Theresia Majors. Tanda Rockers, M.D.DATE OF BIRTH:  Dec 23, 1950   DATE OF CONSULTATION:  DATE OF DISCHARGE:  11/13/2004                                   CONSULTATION   PROCEDURE:  Hyperbaric oxygen treatment completion.   The patient initially was seen on September 13, 2004 through November 04, 2004.  A total of 36 HBO treatments were administered. The patient received six  interim treatments while awaiting a reevaluation by ENT for determination of  whether or not continued therapy was indicated based on the oral pharyngeal  exam. The the patient was reevaluated and a second course of therapy was  recommended and was initiated on November 21, 2004, through December 09, 2004.  A total of 51 HBO treatments were completed.   IMPRESSION:  Subjectively, the patient reports a less pain and drainage.  Objectively, there is no drainage from the external wound and, in fact, the  wound is completely healed over. There is left facial edema. These  observations have been documented in the wound expert by the digital  photography. The ENT exams per Dr. Ezzard Standing suggest response.   DISPOSITION:  This patient is discharge from the HBO clinic. We are  recommending that he continue his surveillance as directed by Dr. Petra Kuba  in consultation with Dr. Ezzard Standing. The patient seems to understand his  instructions. He expresses gratitude for having been seen in the wound care  and hyperbaric center.           ______________________________  Theresia Majors. Tanda Rockers, M.D.     [name]  D:  12/12/2004  T:  12/12/2004  Job:  [id number]   cc:   Kristine Garbe. Ezzard Standing, M.D.  Fax: [phone]   Mina Marble, M.D.  Fax: [phone]

## 2010-07-01 NOTE — Consult Note (Signed)
[place]  Patient:    Nicholas Cobb, Nicholas Cobb                    MRN: [id number] Proc. Date: 11/26/99 Adm. Date:  [id number] Attending:  Gustavo Lah A                          Consultation Report  REASON FOR CONSULTATION:  Facial swelling.  HISTORY OF PRESENT ILLNESS:  This 60 year old black gentleman was treated about two years ago for some sort of head and neck carcinoma.  He had some sort of neck surgery on the right side and he underwent a high dose of radiotherapy.  Since then, he had had severe trismus and difficulty eating solid food.  He has lost about 30 pounds and has lost several teeth.  He did not have his teeth removed prior to the radiation therapy.  As far as he can recall, the radiation went up to just below his right eye.  There were no tattoos remaining.  He recently moved to [place].  He has had intermittent swelling of the face that has been treated with prednisone relatively successfully.  He started swelling again several days ago.  PAST MEDICAL HISTORY:  Otherwise negative.  SOCIAL HISTORY:  He quit smoking when he was diagnosed with cancer.  No recent history of alcohol use.  He is currently unemployed on disability because of his condition.  PHYSICAL EXAMINATION:  GENERAL:  He is is no distress.  VITAL SIGNS:  Stable and listed on the chart.  Heart rated is slightly elevated at 109, he is afebrile.  NECK:  There is severe woody edema of the cervical structures.  He has contracture of his neck and is not able to extend his neck up to the horizon.  HEENT:  He has severe induration of the entire right side of the face, but no erythema, fluctuance or tenderness.  Intraoral palpation reveals severe fibrosis of the muscles of mastication.  His dentition is in horrendous shape. Severely diseased upper and lower full dentition that I can visualize.  There is severe trismus and not able to open more than about 2 mm.  I  cannot evaluate the oral cavity, pharynx or larynx at this examination.  His voice is slightly rough, but there is no obstructive breathing.  There are no palpable neck masses.  The ears are occluded with cerumen bilaterally.  Nasal exam is clear.  IMPRESSION:  Severe facial fibrosis secondary to high dose of radiotherapy.  PLAN:  Treatment him with prednisone 20 mg q.d.  Have him followup in the office during the week.  Will obtain records from his treatments to try to evaluate where the carcinoma was and to help with further follow up. DD:  11/26/99 TD:  11/27/99 Job: [id number]

## 2010-07-01 NOTE — Op Note (Signed)
[facility]  Patient:    Nicholas Cobb, Nicholas Cobb                    MRN: [id number] Proc. Date: 12/16/99 Adm. Date:  [id number] Attending:  Lucky Cowboy CC:         Jefry H. Pollyann Kennedy, M.D.   Operative Report  PREOPERATIVE DIAGNOSIS:  Recurrent oropharyngeal cancer.  POSTOPERATIVE DIAGNOSIS:  Recurrent oropharyngeal cancer.  PROCEDURE:  Gastrostomy (24-French Foley with 5 cc balloon).  SURGEON:  Adolph Pollack, M.D.  ANESTHESIA:  General.  INDICATIONS:  This is a 60 year old male who has recurrent oropharyngeal cancer that is causing some airway obstruction.  He also has trismus.  He had a neck abscess as well.  He required tracheotomy and then drainage of neck abscess.  He is unable to take oral intake and now requires long-term access for enteral feedings.  The procedure and risks including, but not limited to bleeding, infection, and leak were explained to him.  He seemed to understand and agreed to proceed.  I also explained this to the family.  DESCRIPTION OF PROCEDURE:  He was placed supine on his bed and given a general anesthetic.  His abdomen was sterilely prepped and draped.  A small upper midline incision was made, incising the skin sharply and dividing the subcutaneous tissue and midline fascia with the cautery.  The peritoneum was entered sharply under direct vision.  Using a Babcock, I grasped the distal body of the stomach and placed two pursestring sutures of 2-0 silk into the body of the stomach with partial thickness bites.  Next, a stab incision was made in the left upper quadrant and the 24-French Foley with 5 cc balloon was passed through the abdominal wall into the peritoneal cavity.  The balloon was inflated and worked well.  A gastrotomy was then made and the Foley was placed into the stomach.  The two pursestring sutures were tightened down around the tube.  Four quadrant gastropexy was then performed with interrupted 2-0 silk  sutures.  The gastrostomy tube was pulled up and the Foley balloon was flushed with the anterior stomach wall and the posterior abdominal wall.  Next, the tube was anchored to the skin with 3-0 nylon sutures.  The fascia was then closed with a #1 PDS suture in a running fashion.  The subcutaneous tissue was irrigated and the skin closed with staples.  A sterile dressing was applied.  He tolerated the procedure well without any apparent complications and was taken to the recovery room in satisfactory condition. DD:  12/16/99 TD:  12/18/99 Job: [id number]

## 2010-07-01 NOTE — Consult Note (Signed)
NAMEMOHAMMEDALI, BEDOY NO.:  [id number]   MEDICAL RECORD NO.:  [id number]            PATIENT TYPE:   LOCATION:                                 FACILITY:   PHYSICIAN:  Theresia Majors. Tanda Rockers, M.D.     DATE OF BIRTH:   DATE OF CONSULTATION:  DATE OF DISCHARGE:                                   CONSULTATION   DATE OF CONSULTATION:  August 12, 2004   REASON FOR CONSULTATION:  This 60 year old man is referred by Dr. Corine Shelter for evaluation of hyperbaric oxygen treatment for  osteoradionecrosis.   IMPRESSION:  Osteoradionecrosis.   RECOMMENDATIONS:  1.  Thirty treatments of hyperbaric oxygen at 2.4 atmospheres totaling 90      minutes with two 5-minute air breaks.  2.  The HBO therapy is to commence following ENT evaluation for placement of      bilateral tympanic membrane tubes.   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 He did well but developed delayed complications of radiation therapy  requiring multiple debridement procedures by multiple oral surgeons. The  definitive procedure was performed at the [facility] and  required removal of the ramus of the mandible. Since that time he had he did  well and did require hyperbaric oxygen treatment at [facility] for  resolution of the wounds.   Over the last year he has developed progressive pain in the area of a  draining sinus which flares up intermittently. This area is associated with  some fluctuance. It then drains and the swelling recedes. He is seen for  consideration of HBO therapy.   PAST MEDICAL HISTORY:  His past medical history is remarkable for his  laryngeal tumor. He has been seen in [place] by Dr. Haroldine Laws and Pollyann Kennedy in  the past. His nutrition is being delivered by way of PEG tube. He is unable  to eat table food. He also has a permanent  tracheostomy. His current  medications include ibuprofen, Viagra, and hydrocodone. He also has had  Lorcet 10/650. He denies allergies.   FAMILY HISTORY:  Is positive for hypertension and diabetes.   SOCIALLY:  He is single. He has two adult children - one locally and one  remotely.   REVIEW OF SYSTEMS:  Discloses that he is relatively active. He is unemployed  because of his medical disability. He has no chest pain, no hemoptysis, and  no stigmata of TIAs.   PHYSICAL EXAMINATION:  GENERAL:  He is alert, oriented. He has obvious post  surgical changes from radical mastectomy with a permanent tracheostomy. He  speaks by occluding his tracheostomy tube.  LUNGS:  Clear.  ABDOMEN:  Soft.  EXTREMITIES: Warm.  HEENT:  There is a sinus at the angle of the right jaw away from the  incision of his radical neck dissection. It is approximately 0.4 cm in  transverse diameter with some epithelium and minor slough over that sinus  tract. There is no active  drainage at present. There is an area of  semifluctuance around the inferior right margin of the lower lip. There are  no inflammatory changes. There is no local warmth.  NEUROLOGIC:  The patient is grossly intact.  SKIN:  There are no dominant skin lesions. There is no regional adenopathy.   DISCUSSION:  The patient's history, physical exam is completely consistent  with osteoradionecrosis, delayed. We will recommend that he be seen by ENT  to evaluate the patency of his external auditory canals, to evacuate any  cerumen, and to inspect for the presence of the previously-placed PE tubes.  Once this ENT consultation and appropriate interventions have been made, we  will proceed with the HBO therapy.   It may be useful to refer the patient to a local oral surgeon to evaluate  the integrity of the steel plate that was placed in his right mandible at  [facility]. We have discussed all these issues with the patient in terms  that he seems to  understand. He seems to be optimistic that he will be able  to have his HBO therapy administered locally.           ______________________________  Theresia Majors Tanda Rockers, M.D.     [name]  D:  08/12/2004  T:  08/12/2004  Job:  [id number]   cc:   Eino Farber., M.D.  [address] [place]  [place] [address]  Fax: [phone]

## 2010-07-01 NOTE — Op Note (Signed)
   Nicholas Cobb, Nicholas Cobb                       ACCOUNT NO.:  [id number]   MEDICAL RECORD NO.:  [id number]                   PATIENT TYPE:  AMB   LOCATION:  ENDO                                 FACILITY:  [facility]   PHYSICIAN:  James L. Malon Kindle., M.D.          DATE OF BIRTH:  12-May-1950   DATE OF PROCEDURE:  01/16/2002  DATE OF DISCHARGE:                                 OPERATIVE REPORT   PROCEDURE:  PEG replacement.   MEDICATIONS:  None.   ANESTHESIA:  None.   INDICATIONS FOR PROCEDURE:  Patient has had a Foley catheter, this has been  used and he has had a lot of problems.  For this reason, PEG replacement  tube is put in.   DESCRIPTION OF PROCEDURE:  With the patient in the supine position, the  Foley catheter was removed after the balloon was deflated.  We used a 24-  inch Ross-type replacement tube, we easily slid it into the ostomy.  Inflated the balloon with the required water.  Applied a sterile dressing.   ASSESSMENT:  Successful PEG replacement.   PLAN:  We will go ahead and give the patient Zantac of her dose through the  tube b.i.d. for heartburn.  We will see him back as needed.                                               James L. Malon Kindle., M.D.    Waldron Session  D:  01/16/2002  T:  01/16/2002  Job:  [id number]

## 2010-07-01 NOTE — Consult Note (Signed)
[name], [name]             ACCOUNT NO.:  [id number]   MEDICAL RECORD NO.:  [id number]          PATIENT TYPE:   LOCATION:                                 FACILITY:   PHYSICIAN:  Theresia Majors. Tanda Rockers, M.D.     DATE OF BIRTH:   DATE OF CONSULTATION:  DATE OF DISCHARGE:                                   CONSULTATION   HYPOBARIC OXYGEN COMPLETION RECORD:  Nicholas Cobb is a 60 year old man, who  initiated hypobaric treatment on September 13, 2004.  He underwent a total of 35  dives at 2 atmospheres of pressure for 90 minutes, a total of 35 dives.  He  required PE tubes placed bilaterally by Dr. Ezzard Standing for __________  barotrauma.   At the time of this dictation, his presenting lesion on the right mandible  has shown dramatic improvement.  We are discontinuing his HBO treatment  pending reevaluation by his otolaryngologist and radiation therapist.           ______________________________  Theresia Majors. Tanda Rockers, M.D.     [name]  D:  11/04/2004  T:  11/05/2004  Job:  [id number]   cc:   Mina Marble, M.D.  Fax: [phone]   Kristine Garbe. Ezzard Standing, M.D.  Fax: [phone]

## 2010-07-01 NOTE — Op Note (Signed)
[facility]  Patient:    Nicholas Cobb, Nicholas Cobb                    MRN: [id number] Proc. Date: 12/08/99 Adm. Date:  [id number] Attending:  Lucky Cowboy CC:         [facility]   Operative Report  PREOPERATIVE DIAGNOSIS:  Right neck abscess.  POSTOPERATIVE DIAGNOSIS:  Right neck abscess.  PROCEDURE:  Incision and drainage, right neck abscess.  SURGEON:  Lucky Cowboy, M.D.  ANESTHESIA:  General endotracheal anesthesia.  ESTIMATED BLOOD LOSS:  30 cc.  SPECIMENS:  Culture from right abscess cavity, tissue obtained from right abscess cavity.  COMPLICATIONS:  None.  INDICATIONS:  The patient is a 60 year old male who has undergone right radical neck dissection and radiation therapy, which was completed in 1999, for a head and neck cancer of unknown primary site.  This was performed in [place], [place].  The patient underwent emergent tracheotomy due to airway obstruction two days prior.  He was noted to have a mass in the right neck.  He underwent a CT scan, which revealed approximately a 3 cm neck abscess with air.  This extended up to the mandible.  FINDINGS:  The patient was noted to have a large abscess cavity in the right neck in zone 2.  This extended superiorly to the medial border of the mandible.  There was dense fibrosis in the depths of the wound.  A tissue sample was obtained from this area.  Examination of the oral cavity was extremely limited due to severe trismus of less than 1 cm.  The patient was noted to have an exposed right mandibular molar with possibly some necrosis of the underlying mandible bone.  DESCRIPTION OF PROCEDURE:  The patient was taken to the operating room and placed on the table in the supine position.  He was then placed under general endotracheal anesthesia and the right neck prepped with Betadine and draped in the usual sterile fashion.  A transverse 2 cm incision was made  beginning anteriorly at the area of fluctuance.  Pus was freely expressed.  This contained approximately 10 cc.  Culture was obtained.  Tissue sample was also obtained from the depths of the wound.  The area was copiously irrigated. Finger dissection was used to ensure complete cavity was entered.  Findings are as noted above.  The area was copiously irrigated with normal saline and two Penrose drains placed in the depths of the wound.  One was placed medial to the mandible superiorly, and one was placed posteriorly.  These were secured to the skin in a simple interrupted fashion using 3-0 Prolene.  The anterior margin of the incision was also closed in a simple interrupted fashion using 3-0 Prolene.  A neck dressing was then applied.  Attempt was made to place a feeding tube down the right nasal cavity.  It met with some resistance.  Fiberoptic exam through the trachea did not reveal any tube in the trachea.  Once this was performed, the patient was transferred to the recovery room in stable condition.  There were no complications. DD:  12/08/99 TD:  12/09/99 Job: [id number]

## 2010-07-07 ENCOUNTER — Inpatient Hospital Stay (INDEPENDENT_AMBULATORY_CARE_PROVIDER_SITE_OTHER)
Admission: RE | Admit: 2010-07-07 | Discharge: 2010-07-07 | Disposition: A | Discharge status: Home / Self Care | Payer: Medicare Other | Source: Ambulatory Visit

## 2010-07-07 DIAGNOSIS — R221 Localized swelling, mass and lump, neck: Secondary | ICD-10-CM

## 2010-07-07 DIAGNOSIS — K5900 Constipation, unspecified: Secondary | ICD-10-CM

## 2010-09-10 ENCOUNTER — Emergency Department (HOSPITAL_COMMUNITY): Payer: Medicare Other

## 2010-09-10 ENCOUNTER — Emergency Department (HOSPITAL_COMMUNITY)
Admission: EM | Admit: 2010-09-10 | Discharge: 2010-09-10 | Disposition: A | Discharge status: Home / Self Care | Payer: Medicare Other | Attending: Emergency Medicine | Admitting: Emergency Medicine

## 2010-09-10 DIAGNOSIS — Z85819 Personal history of malignant neoplasm of unspecified site of lip, oral cavity, and pharynx: Secondary | ICD-10-CM | POA: Insufficient documentation

## 2010-09-10 DIAGNOSIS — R509 Fever, unspecified: Secondary | ICD-10-CM | POA: Insufficient documentation

## 2010-09-10 DIAGNOSIS — R05 Cough: Secondary | ICD-10-CM | POA: Insufficient documentation

## 2010-09-10 DIAGNOSIS — R059 Cough, unspecified: Secondary | ICD-10-CM | POA: Insufficient documentation

## 2010-09-10 DIAGNOSIS — Y849 Medical procedure, unspecified as the cause of abnormal reaction of the patient, or of later complication, without mention of misadventure at the time of the procedure: Secondary | ICD-10-CM | POA: Insufficient documentation

## 2010-09-10 DIAGNOSIS — R0602 Shortness of breath: Secondary | ICD-10-CM | POA: Insufficient documentation

## 2010-09-10 DIAGNOSIS — K9420 Gastrostomy complication, unspecified: Secondary | ICD-10-CM | POA: Insufficient documentation

## 2010-09-10 DIAGNOSIS — Z930 Tracheostomy status: Secondary | ICD-10-CM | POA: Insufficient documentation

## 2010-11-04 ENCOUNTER — Encounter (INDEPENDENT_AMBULATORY_CARE_PROVIDER_SITE_OTHER): Payer: Self-pay | Admitting: General Surgery

## 2010-11-07 ENCOUNTER — Emergency Department (HOSPITAL_COMMUNITY): Payer: Medicare HMO

## 2010-11-07 ENCOUNTER — Emergency Department (HOSPITAL_COMMUNITY)
Admission: EM | Admit: 2010-11-07 | Discharge: 2010-11-07 | Disposition: A | Discharge status: Home / Self Care | Payer: Medicare HMO | Attending: Emergency Medicine | Admitting: Emergency Medicine

## 2010-11-07 ENCOUNTER — Encounter (INDEPENDENT_AMBULATORY_CARE_PROVIDER_SITE_OTHER): Payer: Self-pay | Admitting: General Surgery

## 2010-11-07 ENCOUNTER — Ambulatory Visit (INDEPENDENT_AMBULATORY_CARE_PROVIDER_SITE_OTHER): Payer: Medicare HMO | Admitting: General Surgery

## 2010-11-07 VITALS — BP 130/76 | HR 70 | Temp 97.9°F | Resp 20 | Ht 62.0 in | Wt 160.2 lb

## 2010-11-07 DIAGNOSIS — IMO0002 Reserved for concepts with insufficient information to code with codable children: Secondary | ICD-10-CM | POA: Insufficient documentation

## 2010-11-07 DIAGNOSIS — S0990XA Unspecified injury of head, initial encounter: Secondary | ICD-10-CM | POA: Insufficient documentation

## 2010-11-07 DIAGNOSIS — Z431 Encounter for attention to gastrostomy: Secondary | ICD-10-CM | POA: Insufficient documentation

## 2010-11-07 DIAGNOSIS — M25519 Pain in unspecified shoulder: Secondary | ICD-10-CM | POA: Insufficient documentation

## 2010-11-07 DIAGNOSIS — Z79899 Other long term (current) drug therapy: Secondary | ICD-10-CM | POA: Insufficient documentation

## 2010-11-07 DIAGNOSIS — R42 Dizziness and giddiness: Secondary | ICD-10-CM | POA: Insufficient documentation

## 2010-11-07 DIAGNOSIS — Z85819 Personal history of malignant neoplasm of unspecified site of lip, oral cavity, and pharynx: Secondary | ICD-10-CM | POA: Insufficient documentation

## 2010-11-07 DIAGNOSIS — K219 Gastro-esophageal reflux disease without esophagitis: Secondary | ICD-10-CM | POA: Insufficient documentation
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
## 2011-01-09 ENCOUNTER — Other Ambulatory Visit: Payer: Self-pay | Admitting: Internal Medicine

## 2011-01-09 DIAGNOSIS — R51 Headache: Secondary | ICD-10-CM

## 2011-01-12 ENCOUNTER — Other Ambulatory Visit: Payer: Medicare HMO

## 2011-02-03 ENCOUNTER — Inpatient Hospital Stay (HOSPITAL_COMMUNITY)
Admission: EM | Admit: 2011-02-03 | Discharge: 2011-02-06 | DRG: 195 | Disposition: A | Discharge status: Home / Self Care | Payer: Medicare HMO | Attending: Internal Medicine | Admitting: Internal Medicine

## 2011-02-03 ENCOUNTER — Encounter (HOSPITAL_COMMUNITY): Payer: Self-pay | Admitting: *Deleted

## 2011-02-03 ENCOUNTER — Emergency Department (HOSPITAL_COMMUNITY): Payer: Medicare HMO

## 2011-02-03 ENCOUNTER — Other Ambulatory Visit: Payer: Self-pay

## 2011-02-03 DIAGNOSIS — Z85819 Personal history of malignant neoplasm of unspecified site of lip, oral cavity, and pharynx: Secondary | ICD-10-CM

## 2011-02-03 DIAGNOSIS — Z87891 Personal history of nicotine dependence: Secondary | ICD-10-CM

## 2011-02-03 DIAGNOSIS — J189 Pneumonia, unspecified organism: Principal | ICD-10-CM | POA: Diagnosis present

## 2011-02-03 DIAGNOSIS — Z930 Tracheostomy status: Secondary | ICD-10-CM

## 2011-02-03 DIAGNOSIS — Z431 Encounter for attention to gastrostomy: Secondary | ICD-10-CM

## 2011-02-03 DIAGNOSIS — R609 Edema, unspecified: Secondary | ICD-10-CM | POA: Diagnosis present

## 2011-02-03 DIAGNOSIS — R0902 Hypoxemia: Secondary | ICD-10-CM | POA: Diagnosis present

## 2011-02-03 LAB — COMPREHENSIVE METABOLIC PANEL
ALT: 30 U/L (ref 0–53)
AST: 26 U/L (ref 0–37)
Albumin: 3.1 g/dL — ABNORMAL LOW (ref 3.5–5.2)
CO2: 28 mEq/L (ref 19–32)
Calcium: 10.3 mg/dL (ref 8.4–10.5)
Chloride: 91 mEq/L — ABNORMAL LOW (ref 96–112)
Creatinine, Ser: 0.97 mg/dL (ref 0.50–1.35)
GFR calc non Af Amer: 87 mL/min — ABNORMAL LOW (ref >90)
Sodium: 131 mEq/L — ABNORMAL LOW (ref 135–145)
Total Bilirubin: 0.3 mg/dL (ref 0.3–1.2)

## 2011-02-03 LAB — CBC
MCHC: 34.3 g/dL (ref 30.0–36.0)
Platelets: 400 10*3/uL (ref 150–400)
RDW: 13.5 % (ref 11.5–15.5)
WBC: 12.7 10*3/uL — ABNORMAL HIGH (ref 4.0–10.5)

## 2011-02-03 LAB — DIFFERENTIAL
Basophils Absolute: 0 10*3/uL (ref 0.0–0.1)
Basophils Relative: 0 % (ref 0–1)
Lymphocytes Relative: 6 % — ABNORMAL LOW (ref 12–46)
Neutro Abs: 10.6 10*3/uL — ABNORMAL HIGH (ref 1.7–7.7)
Neutrophils Relative %: 84 % — ABNORMAL HIGH (ref 43–77)

## 2011-02-03 LAB — AMMONIA: Ammonia: 28 umol/L (ref 11–60)

## 2011-02-03 LAB — LACTIC ACID, PLASMA: Lactic Acid, Venous: 1.8 mmol/L (ref 0.5–2.2)

## 2011-02-03 LAB — POCT I-STAT TROPONIN I: Troponin i, poc: 0.08 ng/mL (ref 0.00–0.08)

## 2011-02-03 MED ORDER — ONDANSETRON HCL 4 MG/2ML IJ SOLN: 4.0000 mg | Freq: Four times a day (QID) | INTRAMUSCULAR | Status: DC | PRN

## 2011-02-03 MED ORDER — CEFTRIAXONE SODIUM 1 G IJ SOLR
1.0000 g | INTRAMUSCULAR | Status: DC
  Filled 2011-02-03: qty 10

## 2011-02-03 MED ORDER — ALBUTEROL SULFATE (5 MG/ML) 0.5% IN NEBU: 2.5000 mg | INHALATION_SOLUTION | RESPIRATORY_TRACT | Status: DC | PRN

## 2011-02-03 MED ORDER — ONDANSETRON HCL 4 MG PO TABS: 4.0000 mg | ORAL_TABLET | Freq: Four times a day (QID) | ORAL | Status: DC | PRN

## 2011-02-03 MED ORDER — ENOXAPARIN SODIUM 40 MG/0.4ML ~~LOC~~ SOLN
40.0000 mg | SUBCUTANEOUS | Status: DC
  Administered 2011-02-04 – 2011-02-05 (×2): 40 mg via SUBCUTANEOUS
  Filled 2011-02-03 (×3): qty 0.4

## 2011-02-03 MED ORDER — ACETAMINOPHEN 160 MG/5ML PO SOLN
650.0000 mg | Freq: Once | ORAL | Status: AC
  Administered 2011-02-03: 650 mg via ORAL
  Filled 2011-02-03: qty 20.3

## 2011-02-03 MED ORDER — DEXTROSE 5 % IV SOLN
1.0000 g | Freq: Once | INTRAVENOUS | Status: AC
  Administered 2011-02-03: 1 g via INTRAVENOUS
  Filled 2011-02-03: qty 10

## 2011-02-03 MED ORDER — DEXTROSE 5 % IV SOLN
500.0000 mg | INTRAVENOUS | Status: DC
  Administered 2011-02-04 – 2011-02-06 (×2): 500 mg via INTRAVENOUS
  Filled 2011-02-03 (×3): qty 500

## 2011-02-03 MED ORDER — SODIUM CHLORIDE 0.9 % IV BOLUS (SEPSIS)
1000.0000 mL | Freq: Once | INTRAVENOUS | Status: AC
  Administered 2011-02-03: 1000 mL via INTRAVENOUS

## 2011-02-03 MED ORDER — ALBUTEROL SULFATE (5 MG/ML) 0.5% IN NEBU
2.5000 mg | INHALATION_SOLUTION | Freq: Four times a day (QID) | RESPIRATORY_TRACT | Status: DC
  Administered 2011-02-04 (×2): 2.5 mg via RESPIRATORY_TRACT
  Filled 2011-02-03 (×2): qty 0.5

## 2011-02-03 MED ORDER — ACETAMINOPHEN 325 MG PO TABS: 650.0000 mg | ORAL_TABLET | Freq: Four times a day (QID) | ORAL | Status: DC | PRN

## 2011-02-03 MED ORDER — DEXTROSE 5 % IV SOLN
500.0000 mg | Freq: Once | INTRAVENOUS | Status: AC
  Administered 2011-02-03: 500 mg via INTRAVENOUS
  Filled 2011-02-03: qty 500

## 2011-02-03 MED ORDER — ACETAMINOPHEN 650 MG RE SUPP: 650.0000 mg | Freq: Four times a day (QID) | RECTAL | Status: DC | PRN

## 2011-02-03 MED ORDER — ZOLPIDEM TARTRATE 5 MG PO TABS: 5.0000 mg | ORAL_TABLET | Freq: Every evening | ORAL | Status: DC | PRN

## 2011-02-03 MED ORDER — ACETAMINOPHEN 160 MG/5ML PO SOLN
650.0000 mg | Freq: Four times a day (QID) | ORAL | Status: DC | PRN
Start: 2011-02-03 — End: 2011-02-06

## 2011-02-03 MED ORDER — SODIUM CHLORIDE 0.9 % IV SOLN
INTRAVENOUS | Status: DC
  Administered 2011-02-04 – 2011-02-06 (×3): via INTRAVENOUS

## 2011-02-03 MED ORDER — OXYCODONE HCL 5 MG PO TABS: 5.0000 mg | ORAL_TABLET | ORAL | Status: DC | PRN

## 2011-02-03 MED ORDER — HYDROMORPHONE HCL PF 1 MG/ML IJ SOLN
0.5000 mg | INTRAMUSCULAR | Status: DC | PRN
  Administered 2011-02-04: 1 mg via INTRAVENOUS
  Administered 2011-02-04: 0.5 mg via INTRAVENOUS
  Administered 2011-02-04 – 2011-02-06 (×6): 1 mg via INTRAVENOUS
  Filled 2011-02-03 (×8): qty 1

## 2011-02-03 MED ORDER — ALUM & MAG HYDROXIDE-SIMETH 200-200-20 MG/5ML PO SUSP
30.0000 mL | Freq: Four times a day (QID) | ORAL | Status: DC | PRN
  Administered 2011-02-05 – 2011-02-06 (×2): 30 mL via ORAL
  Filled 2011-02-03 (×2): qty 30

## 2011-02-03 NOTE — ED Provider Notes (Signed)
5:58 PM  I performed a history and physical examination of Margarette Asal and discussed his management with Dr. Gwendolyn Grant.  I agree with the history, physical, assessment, and plan of care, with the following exceptions: None  I have seen and evaluated the patient face-to-face. 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 In the ED, he is awake and alert, responsive, following commands, with a frequent cough producing thick purulent sputum from his tracheostomy. On auscultation of his lungs, rhonchi and rales are heard in the right lower lobe anteriorly. The patient has a fever. I suspect pneumonia or other infection. We will assess as well for possible stroke, but without obvious focal deficit and without unknown time of onset, the patient is not an interventional candidate for stroke or a code stroke.  I was present for the following procedures: None Time Spent in Critical Care of the patient: None Time spent in discussions with the patient and family: 5 minutes  Lucus Lambertson D    Felisa Bonier, MD 02/03/11 1800

## 2011-02-03 NOTE — ED Notes (Signed)
Patient brought in by EMS. Trach with blocked and Respiratory began to suction. Patient starting to respond. Patient has swelling in right side of face and feeding tube. Patient told EMS he has a headache. EMS was called after patient was found at [employer] standing in the parking lot talking to a truck. 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 EMS stated that he told them he remembers talking to the truck but do not respond when asked why he was taking to a truck. Only conplain patient gave EMS was headache, muscus in trach and swelling in right side of his face.

## 2011-02-03 NOTE — Progress Notes (Signed)
Pt came to er by ems discovered trach inner cannula was plugged clean inner cannula nad replaced  Carrington Clamp

## 2011-02-03 NOTE — ED Notes (Signed)
Patient covered his trach and told me he was feeling better. Patient resting with NAD at this time. Patient has a productive cough with yellow sputum.

## 2011-02-03 NOTE — ED Notes (Addendum)
Patient came in with his trach stopped up with mucus. Respiratory in room with patient suctioning patient and cleaning trach tube. Patient not answering questions at this time. After respiratory therapy patient does seem more comfortable. Patient has a skin abrasion on the right side neck. Looks like it is scabed over and it is not draining. Patient has a feeding tube and trach.

## 2011-02-03 NOTE — ED Notes (Signed)
Patient with productive cough. Sputum is yellow. IV started and fluids to gravity. Patient resting with NAD at this time.

## 2011-02-03 NOTE — ED Notes (Signed)
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 Employees said they know the patient and he normally comes in and talks and laughs with them, they stated that this is not his normal behavior. Patient was talking with patient on arrival and he  Told them he had a headache and swelling on right side of his face, patient became slow to answer questions. VS 118/88 and 138/90 with CBG 125. Patient had equal grips on his hands when EMS arrived and patient was able to walk.

## 2011-02-03 NOTE — ED Provider Notes (Signed)
History     CSN: [id number]  Arrival date & time 02/03/11  1653   First MD Initiated Contact with Patient 02/03/11 1702      Chief Complaint  Patient presents with  . Cerebrovascular Accident    (Consider location/radiation/quality/duration/timing/severity/associated sxs/prior treatment) Patient is a 60 y.o. male presenting with altered mental status. The history is provided by the patient. The history is limited by the condition of the patient (Not communicative). No language interpreter was used.  Altered Mental Status This is a new problem. The current episode started today. The problem occurs rarely. The problem has been unchanged. Associated symptoms include anorexia, congestion, coughing (markedly productive) and a fever (subjective). Pertinent negatives include no abdominal pain, nausea, rash, sore throat, vomiting or weakness. The symptoms are aggravated by nothing. He has tried nothing for the symptoms. The treatment provided no relief.    Past Medical History  Diagnosis Date  . Hearing loss   . Change in voice   . Rash   . Trouble swallowing   . Difficulty urinating   . Cancer 1999    throat cancer    Past Surgical History  Procedure Date  . Peg tube placement 11/1999  . Tracheostomy 11/1999  . Gastrostomy tube placement     History reviewed. No pertinent family history.  History  Substance Use Topics  . Smoking status: Former Smoker    Quit date: 11/06/1996  . Smokeless tobacco: Never Used  . Alcohol Use: No      Review of Systems  Constitutional: Positive for fever (subjective).  HENT: Positive for congestion. Negative for sore throat.   Respiratory: Positive for cough (markedly productive). Negative for shortness of breath.   Gastrointestinal: Positive for anorexia. Negative for nausea, vomiting and abdominal pain.  Genitourinary: Negative for dysuria.  Skin: Negative for rash.  Neurological: Negative for syncope and weakness.    Psychiatric/Behavioral: Positive for altered mental status.  All other systems reviewed and are negative.    Allergies  Codeine  Home Medications   Current Outpatient Rx  Name Route Sig Dispense Refill  . HYDROCODONE-ACETAMINOPHEN 10-650 MG PO TABS Oral Take 1 tablet by mouth every 8 (eight) hours as needed. For pain.    . IBUPROFEN 200 MG PO TABS Oral Take 200 mg by mouth every 8 (eight) hours as needed. For pain.    Marland Kitchen RANITIDINE HCL 150 MG PO TABS Oral Take 150 mg by mouth 2 (two) times daily.        BP 127/82  Pulse 106  Temp(Src) 101.1 F (38.4 C) (Oral)  Resp 19  SpO2 98%  Physical Exam  Constitutional: He is oriented to person, place, and time. He appears well-developed and well-nourished. No distress.  HENT:  Head: Normocephalic and atraumatic.  Mouth/Throat: No oropharyngeal exudate.       R facial abnormality c/w previous surgery. R mandible thick, drops inferior to chin line.  No obvious induration/fluctuance mandible soft tissues. Submental space soft.  Eyes: EOM are normal. Pupils are equal, round, and reactive to light.  Neck: Normal range of motion. Neck supple.  Cardiovascular: Normal rate and regular rhythm.  Exam reveals no friction rub.   No murmur heard. Pulmonary/Chest: Effort normal. No respiratory distress. He has no wheezes. He has rhonchi in the right lower field. He has rales in the right lower field.  Abdominal: He exhibits no distension. There is no tenderness. There is no rebound.  Musculoskeletal: Normal range of motion. He exhibits no edema.  Neurological: He  is alert and oriented to person, place, and time.  Skin: He is not diaphoretic.    ED Course  Procedures (including critical care time)   Labs Reviewed  CBC  DIFFERENTIAL  COMPREHENSIVE METABOLIC PANEL  CULTURE, BLOOD (ROUTINE X 2)  CULTURE, BLOOD (ROUTINE X 2)  CULTURE, RESPIRATORY  LACTIC ACID, PLASMA   No results found.   No diagnosis found. Diagnoses: 1. Pneumonia,  community-acquired 2. Altered mental status  Ammonia (Final result)   Component (Lab Inquiry)      Result Time  AMMONIA    02/03/11 1826  28         Lactic acid, plasma (Final result)   Component (Lab Inquiry)      Result Time  Lactic Acid, Venous    02/03/11 1825  1.8         Comprehensive metabolic panel (Final result)  Abnormal  Component (Lab Inquiry)      Result Time  NA  K  CL  CO2  GLUCOSE    02/03/11 1822  131 (L)  4.1  91 (L)  28  112 (H)           Result Time  BUN  Creatinine, Ser  CALCIUM  PROTEIN  Albumin    02/03/11 1822  13  0.97  10.3  9.3 (H)  3.1 (L)           Result Time  AST  ALT  ALK PHOS  BILI TOTL  GFR calc non Af Amer    02/03/11 1822  26  30  183 (H)  0.3  87 (L)           Result Time  GFR calc Af Amer    02/03/11 1822  >90 The eGFR has been calculated using the CKD EPI equation. This calculation has not been validated in all clinical situations. eGFR's persistently <90 mL/min signify possible Chronic Kidney Disease.         POCT i-Stat troponin I (Final result)   Component (Lab Inquiry)      Result Time  Troponin i, poc  Comment 3    02/03/11 1804  0.08   Due to the release kinetics of cTnI, a negative result within the first hours of the onset of symptoms does not rule out myocardial infarction with certainty. If myocardial infarction is still suspected, repeat the test at appropriate intervals.         Differential (Edited)  Abnormal  Component (Lab Inquiry)      Result Time  NEUTRO PCT  AB NEUTRO  LYMPHO PCT  AB LYM  MONO PCT    02/03/11 1753  84 (H)  10.6 (H)  6 (L)  0.8  10           Result Time  MONO ABS  EOS PCT  EOSINO ABS  BASOS PCT  BASOS ABS    02/03/11 1753  1.3 (H)  0  0.0  0  0.0         CBC (Final result)  Abnormal  Component (Lab Inquiry)      Result Time  WBC  RBC  HGB  HCT  MCV    02/03/11 1753  12.7 (H)  4.41  13.5  39.4  89.3           Result Time  MCH  MCHC  RDW  PLT    02/03/11 1753  30.6  34.3   13.5  400  Imaging Results         CT Head Wo Contrast (Final result)   Result time:02/03/11 1914    Final result by Rad Results In Interface (02/03/11 19:14:01)    Narrative:   *RADIOLOGY REPORT*  Clinical Data: CVA  CT HEAD WITHOUT CONTRAST  Technique: Contiguous axial images were obtained from the base of the skull through the vertex without contrast.  Comparison: 03/24/2022 12  Findings: No There is no evidence for acute hemorrhage, hydrocephalus, mass lesion, or abnormal extra-axial fluid collection. No definite CT evidence for acute infarction. Fluid is visualized in the right mastoid air cells, new in the interval.  IMPRESSION: No acute intracranial abnormality.  New right mastoid air cell effusion.  Original Report Authenticated By: ERIC A. MANSELL, M.D.            DG Chest 2 View (Final result)   Result time:02/03/11 [phone]    Final result by Rad Results In Interface (02/03/11 17:48:21)    Narrative:   *RADIOLOGY REPORT*  Clinical Data: Chest pain, congestion, productive cough  CHEST - 2 VIEW  Comparison: 09/10/2010  Findings: Tracheostomy tube projects over tracheal air column. Upper normal heart size. Mediastinal contours and pulmonary vascularity normal. Surgical clips project over right apex. Bronchitic changes with bibasilar atelectasis. Subtle left basilar infiltrate not excluded. No pleural effusion or pneumothorax. Prior plating of right mandible.  IMPRESSION: Bronchitic changes with probable bibasilar atelectasis though subtle left base infiltrate is not completely excluded.  Original Report Authenticated By: Lollie Marrow, M.D.       EKG shows sinus tachycardia with rate in low 100s. No signs of acute ischemia, no ST changes c/w ischemia.    MDM  58M p/w altered mental status. Found in parking lot of tire store talking to a truck. Followed commands and answered some questions for EMS. On arrival, alert, following  commands. Intermittently answering questions. Remembers being at tire store. Febrile, tachycardic, normotensive. Respiratory therapist suctioned out large amount of mucus from tracheostomy tube. Patient reports productive cough for several days and subjective fevers. Hx of throat cancer with large resection where he received a trach.  Lungs with scattered rhonchi/wheezes. Heavily productive cough. With AMS and unclear hx from patient and EMS, concern for possible infection vs CVA. Labs including blood cultures, lactate, urine studies, and respiratory cultures sent. CXR and Head CT ordered. CXR shows possible PNA in left base, which is c/w clinical picture. Azithro/Rocephin ordered. CT Head normal, no signs of stroke. Mild white count. Normal lactate and ammonia.  Medicine consulted and admitting for pneumonia.     Elwin Mocha, MD 02/04/11 [phone]

## 2011-02-04 DIAGNOSIS — J189 Pneumonia, unspecified organism: Secondary | ICD-10-CM | POA: Diagnosis present

## 2011-02-04 DIAGNOSIS — Z431 Encounter for attention to gastrostomy: Secondary | ICD-10-CM

## 2011-02-04 DIAGNOSIS — C329 Malignant neoplasm of larynx, unspecified: Secondary | ICD-10-CM | POA: Insufficient documentation

## 2011-02-04 DIAGNOSIS — Z930 Tracheostomy status: Secondary | ICD-10-CM

## 2011-02-04 LAB — CBC
HCT: 35.2 % — ABNORMAL LOW (ref 39.0–52.0)
MCHC: 33.2 g/dL (ref 30.0–36.0)
Platelets: 402 10*3/uL — ABNORMAL HIGH (ref 150–400)
RDW: 13.8 % (ref 11.5–15.5)
WBC: 8.8 10*3/uL (ref 4.0–10.5)

## 2011-02-04 LAB — BASIC METABOLIC PANEL
BUN: 14 mg/dL (ref 6–23)
Chloride: 97 mEq/L (ref 96–112)
Creatinine, Ser: 0.93 mg/dL (ref 0.50–1.35)
GFR calc Af Amer: >90 mL/min (ref >90)
GFR calc non Af Amer: 88 mL/min — ABNORMAL LOW (ref >90)

## 2011-02-04 LAB — URINALYSIS, ROUTINE W REFLEX MICROSCOPIC
Bilirubin Urine: NEGATIVE
Hgb urine dipstick: NEGATIVE
Ketones, ur: NEGATIVE mg/dL
Nitrite: NEGATIVE
Protein, ur: NEGATIVE mg/dL
Specific Gravity, Urine: 1.024 (ref 1.005–1.030)
Urobilinogen, UA: 0.2 mg/dL (ref 0.0–1.0)

## 2011-02-04 MED ORDER — ALBUTEROL SULFATE (5 MG/ML) 0.5% IN NEBU: 2.5000 mg | INHALATION_SOLUTION | Freq: Four times a day (QID) | RESPIRATORY_TRACT | Status: DC | PRN

## 2011-02-04 MED ORDER — RANITIDINE HCL 150 MG/10ML PO SYRP
150.0000 mg | ORAL_SOLUTION | Freq: Two times a day (BID) | ORAL | Status: DC
  Filled 2011-02-04 (×2): qty 10

## 2011-02-04 MED ORDER — DEXTROSE 5 % IV SOLN
1.0000 g | INTRAVENOUS | Status: DC
  Administered 2011-02-04 – 2011-02-05 (×2): 1 g via INTRAVENOUS
  Filled 2011-02-04 (×3): qty 10

## 2011-02-04 MED ORDER — JEVITY 1.2 CAL PO LIQD
237.0000 mL | Freq: Four times a day (QID) | ORAL | Status: DC
  Administered 2011-02-04 – 2011-02-06 (×8): 237 mL
  Filled 2011-02-04 (×14): qty 237

## 2011-02-04 MED ORDER — RANITIDINE HCL 150 MG/10ML PO SYRP
150.0000 mg | ORAL_SOLUTION | Freq: Two times a day (BID) | ORAL | Status: DC
  Administered 2011-02-04 – 2011-02-06 (×4): 150 mg
  Filled 2011-02-04 (×6): qty 10

## 2011-02-04 NOTE — Progress Notes (Signed)
Patient performed trach care this am with RN supervision.  Patient also providing self with Jevity feedings at bedside with RN supervision.  Rufuses staff to perform these duties.  Patient has short term memory impairment and stated "When will the doctor see me today?"  Informed patient of time doctor rounded this morning and the progression of his care.  Patient asked same question five times throughout the day.  Orientation provided.  Patient able to cough up secretions from trach.  Secretionsare thick and yellow.  Oxygen saturation 97%. Will continue to monitor.  Osvaldo Angst, RN.

## 2011-02-04 NOTE — H&P (Signed)
DATE OF ADMISSION:  02/04/2011  PCP:   No primary provider on file.   Chief Complaint: Confusion, SOB   HPI: Nicholas Cobb is an 60 y.o. male with a Trachestomy who was reported as having increased confusion today. He was at a [employer], government and he was observed and reported as  "talking to his truck."  EMS was called and he was brought to the ED.  Patient was seen and evaluated and found to have a pneumonia on Chest Xray, and was observed to be coughing up copious yellow mucous from his tracheostomy.     Patient reports having fever and chills.    Past Medical History  Diagnosis Date  . Hearing loss   . Change in voice   . Rash   . Trouble swallowing   . Difficulty urinating   . Cancer 1999    throat cancer    Past Surgical History  Procedure Date  . Peg tube placement 11/1999  . Tracheostomy 11/1999  . Gastrostomy tube placement     Medications:  HOME MEDS: Prior to Admission medications   Medication Sig Start Date End Date Taking? Authorizing Provider  HYDROcodone-acetaminophen (LORCET) 10-650 MG per tablet Take 1 tablet by mouth every 8 (eight) hours as needed. For pain.   Yes Historical Provider, MD  ranitidine (ZANTAC) 150 MG tablet Take 150 mg by mouth 2 (two) times daily.     Yes Historical Provider, MD    Allergies:  Allergies  Allergen Reactions  . Codeine Nausea Only    Social History:   reports that he quit smoking about 14 years ago. He has never used smokeless tobacco. He reports that he does not drink alcohol or use illicit drugs.  Family History: History reviewed. No pertinent family history.  Review of Systems:  The patient denies anorexia, fever, weight loss, vision loss, decreased hearing, hoarseness, chest pain, syncope, dyspnea on exertion, peripheral edema, balance deficits, hemoptysis, abdominal pain, melena, hematochezia, severe indigestion/heartburn, hematuria, incontinence, genital sores, muscle weakness, suspicious skin lesions, transient  blindness, difficulty walking, depression, unusual weight change, abnormal bleeding, enlarged lymph nodes, angioedema, and breast masses.   Physical Exam:  GEN:  Pleasant thin cachectic appearing 60 year old African American male examined  and in no acute distress; cooperative with exam Filed Vitals:   02/03/11 1918 02/03/11 2232 02/03/11 2300 02/03/11 2346  BP:  108/58 152/82   Pulse:  86 86 86  Temp: 100 F (37.8 C)  99 F (37.2 C)   TempSrc:   Oral   Resp:  18 19 20   Height:   5\' 7"  (1.702 m)   Weight:   67.9 kg (149 lb 11.1 oz)   SpO2: 96% 97% 93% 94%   Blood pressure 152/82, pulse 86, temperature 99 F (37.2 C), temperature source Oral, resp. rate 20, height 5\' 7"  (1.702 m), weight 67.9 kg (149 lb 11.1 oz), SpO2 94.00%. PSYCH: He is alert and oriented x4; does not appear anxious does not appear depressed; affect is normal HEENT: Normocephalic and Atraumatic, Mucous membranes pink; PERRLA; EOM intact; Fundi:  Benign;  No scleral icterus, Nares: Patent, Oropharynx: Clear, Edentulous or Fair Dentition, Neck:  +TRACHEOSTOMY, FROM, no cervical lymphadenopathy  CHEST WALL: No tenderness CHEST: Normal respiration, clear to auscultation bilaterally HEART: Regular rate and rhythm; no murmurs rubs or gallops BACK: No kyphosis or scoliosis; no CVA tenderness ABDOMEN: Positive Bowel Sounds, Scaphoid,soft non-tender; no masses. Rectal Exam: Not done EXTREMITIES: no cyanosis, clubbing or edema; no ulcerations. Genitalia:  not examined PULSES: 2+ and symmetric SKIN: Normal hydration no rash or ulceration CNS: Cranial nerves 2-12 grossly intact no focal neurologic deficit   Labs & Imaging Results for orders placed during the hospital encounter of 02/03/11 (from the past 48 hour(s))  CBC     Status: Abnormal   Collection Time   02/03/11  5:15 PM      Component Value Range Comment   WBC 12.7 (*) 4.0 - 10.5 (K/uL)    RBC 4.41  4.22 - 5.81 (MIL/uL)    Hemoglobin 13.5  13.0 - 17.0 (g/dL)     HCT 40.9  81.1 - 91.4 (%)    MCV 89.3  78.0 - 100.0 (fL)    MCH 30.6  26.0 - 34.0 (pg)    MCHC 34.3  30.0 - 36.0 (g/dL)    RDW 78.2  95.6 - 21.3 (%)    Platelets 400  150 - 400 (K/uL)   DIFFERENTIAL     Status: Abnormal   Collection Time   02/03/11  5:15 PM      Component Value Range Comment   Neutrophils Relative 84 (*) 43 - 77 (%)    Neutro Abs 10.6 (*) 1.7 - 7.7 (K/uL)    Lymphocytes Relative 6 (*) 12 - 46 (%)    Lymphs Abs 0.8  0.7 - 4.0 (K/uL)    Monocytes Relative 10  3 - 12 (%)    Monocytes Absolute 1.3 (*) 0.1 - 1.0 (K/uL)    Eosinophils Relative 0  0 - 5 (%)    Eosinophils Absolute 0.0  0.0 - 0.7 (K/uL)    Basophils Relative 0  0 - 1 (%)    Basophils Absolute 0.0  0.0 - 0.1 (K/uL)   COMPREHENSIVE METABOLIC PANEL     Status: Abnormal   Collection Time   02/03/11  5:15 PM      Component Value Range Comment   Sodium 131 (*) 135 - 145 (mEq/L)    Potassium 4.1  3.5 - 5.1 (mEq/L)    Chloride 91 (*) 96 - 112 (mEq/L)    CO2 28  19 - 32 (mEq/L)    Glucose, Bld 112 (*) 70 - 99 (mg/dL)    BUN 13  6 - 23 (mg/dL)    Creatinine, Ser 0.86  0.50 - 1.35 (mg/dL)    Calcium 57.8  8.4 - 10.5 (mg/dL)    Total Protein 9.3 (*) 6.0 - 8.3 (g/dL)    Albumin 3.1 (*) 3.5 - 5.2 (g/dL)    AST 26  0 - 37 (U/L)    ALT 30  0 - 53 (U/L)    Alkaline Phosphatase 183 (*) 39 - 117 (U/L)    Total Bilirubin 0.3  0.3 - 1.2 (mg/dL)    GFR calc non Af Amer 87 (*) >90 (mL/min)    GFR calc Af Amer >90  >90 (mL/min)   LACTIC ACID, PLASMA     Status: Normal   Collection Time   02/03/11  5:28 PM      Component Value Range Comment   Lactic Acid, Venous 1.8  0.5 - 2.2 (mmol/L)   AMMONIA     Status: Normal   Collection Time   02/03/11  5:29 PM      Component Value Range Comment   Ammonia 28  11 - 60 (umol/L)   POCT I-STAT TROPONIN I     Status: Normal   Collection Time   02/03/11  5:41 PM      Component  Value Range Comment   Troponin i, poc 0.08  0.00 - 0.08 (ng/mL)    Comment 3             Dg Chest 2  View  02/03/2011  *RADIOLOGY REPORT*  Clinical Data: Chest pain, congestion, productive cough  CHEST - 2 VIEW  Comparison: 09/10/2010  Findings: Tracheostomy tube projects over tracheal air column. Upper normal heart size. Mediastinal contours and pulmonary vascularity normal. Surgical clips project over right apex. Bronchitic changes with bibasilar atelectasis. Subtle left basilar infiltrate not excluded. No pleural effusion or pneumothorax. Prior plating of right mandible.  IMPRESSION: Bronchitic changes with probable bibasilar atelectasis though subtle left base infiltrate is not completely excluded.  Original Report Authenticated By: Lollie Marrow, M.D.   Ct Head Wo Contrast  02/03/2011  *RADIOLOGY REPORT*  Clinical Data: CVA  CT HEAD WITHOUT CONTRAST  Technique:  Contiguous axial images were obtained from the base of the skull through the vertex without contrast.  Comparison: 03/24/2022 12  Findings: No There is no evidence for acute hemorrhage, hydrocephalus, mass lesion, or abnormal extra-axial fluid collection.  No definite CT evidence for acute infarction. Fluid is visualized in the right mastoid air cells, new in the interval.  IMPRESSION: No acute intracranial abnormality.  New right mastoid air cell effusion.  Original Report Authenticated By: ERIC A. MANSELL, M.D.      Assessment/Plan: 1. Pneumonia- BC X 2 sent, O2, covered for CAP with IV Rocephin and Azithromycin, Nebs ordered.   2. Hypoxemia- Due to #1 3. Altered Mental Status due to #2.   4.  Edema Right ForeArm and Arm-  Elevate Right Arm.   5. Tracheostomy- Trach care and Suctioning PRN ordered.   6. Clarify Diet.   7. Other plans as per orders.    CODE STATUS:      FULL CODE         Geraline Halberstadt C 02/04/2011, 12:01 AM

## 2011-02-04 NOTE — Progress Notes (Signed)
INITIAL ADULT NUTRITION ASSESSMENT Date: 02/04/2011   Time: 9:38 AM Reason for Assessment: TF consult  ASSESSMENT: Male 60 y.o.  Dx: Confusion, shortness of breath  Hx:  Past Medical History  Diagnosis Date  . Hearing loss   . Change in voice   . Rash   . Trouble swallowing   . Difficulty urinating   . Cancer 1999    throat cancer   Related Meds:  Scheduled Meds:   . acetaminophen (TYLENOL) oral liquid 160 mg/5 mL  650 mg Oral Once  . albuterol  2.5 mg Nebulization Q6H  . azithromycin  500 mg Intravenous Once  . azithromycin  500 mg Intravenous Q24H  . cefTRIAXone (ROCEPHIN)  IV  1 g Intravenous Once  . cefTRIAXone (ROCEPHIN) IM  1 g Intramuscular Q24H  . enoxaparin  40 mg Subcutaneous Q24H  . ranitidine  150 mg Per Tube BID  . sodium chloride  1,000 mL Intravenous Once  . DISCONTD: ranitidine  150 mg Oral BID   Continuous Infusions:   . sodium chloride 75 mL/hr at 02/04/11 0000   PRN Meds:.acetaminophen (TYLENOL) oral liquid 160 mg/5 mL, acetaminophen, acetaminophen, albuterol, alum & mag hydroxide-simeth, HYDROmorphone, ondansetron (ZOFRAN) IV, ondansetron, oxyCODONE, zolpidem  Ht: 5\' 7"  (170.2 cm)  Wt: 149 lb 11.1 oz (67.9 kg)  Ideal Wt: 67.2kg % Ideal Wt: 101  Usual Wt: Same as current per pt report % Usual Wt: 100  Body mass index is 23.45 kg/(m^2).  Food/Nutrition Related Hx: Pt reports home TF schedule of 8 cans of Jevity 1.2 daily with 8 ounces of water with each 2 can bolus, given at 6am, 12noon, 5pm, and 10pm. This provides 2280 calories, 105g protein, 34g fiber, and total water. Pt reports stable weight PTA. Pt reports daily bowel movements. Pt reports taking nothing by mouth at home. Pt without any nausea, c/o pain, RN aware. Pt reports cleaning trach daily. Nursing reports pt not confused today, and confusion on admission r/t hypoxemia from pneumonia per H&P.   Labs:  CMP     Component Value Date/Time   NA 134* 02/04/2011 0600   K 4.3  02/04/2011 0600   CL 97 02/04/2011 0600   CO2 26 02/04/2011 0600   GLUCOSE 113* 02/04/2011 0600   BUN 14 02/04/2011 0600   CREATININE 0.93 02/04/2011 0600   CALCIUM 9.8 02/04/2011 0600   PROT 9.3* 02/03/2011 1715   ALBUMIN 3.1* 02/03/2011 1715   AST 26 02/03/2011 1715   ALT 30 02/03/2011 1715   ALKPHOS 183* 02/03/2011 1715   BILITOT 0.3 02/03/2011 1715   GFRNONAA 88* 02/04/2011 0600   GFRAA >90 02/04/2011 0600    Intake/Output Summary (Last 24 hours) at 02/04/11 0945 Last data filed at 02/04/11 0008  Gross per 24 hour  Intake      0 ml  Output    700 ml  Net   -700 ml    Diet Order:  NPO  IVF:    sodium chloride Last Rate: 75 mL/hr at 02/04/11 0000    Estimated Nutritional Needs:   Kcal:2250-2500 Protein:105-115g Fluid:2.2-2.5L  NUTRITION DIAGNOSIS: -Inadequate oral intake (NI-2.1).  Status: Ongoing  RELATED TO: throat CA, trouble swallowing  AS EVIDENCE BY: H&P, PEG, need for TF to meet nutritional needs  MONITORING/EVALUATION(Goals): TF to meet >90% estimated nutritional needs.   EDUCATION NEEDS: -No education needs identified at this time  INTERVENTION: Jevity 1.2, 2 cans QID via PEG. If IVF d'c, recommend water flushes via PEG before and after  each 2 can bolus. Will monitor.   Dietitian #: [phone]  DOCUMENTATION CODES Per approved criteria  -Not Applicable    Marshall Cork 02/04/2011, 9:38 AM

## 2011-02-04 NOTE — Progress Notes (Signed)
Subjective: Feeling better, still with moderate amount of secretions  Objective: Vital signs in last 24 hours: Temp:  [97.7 F (36.5 C)-101.1 F (38.4 C)] 98.7 F (37.1 C) (12/22 1014) Pulse Rate:  [77-109] 82  (12/22 1211) Resp:  [14-21] 16  (12/22 1211) BP: (106-152)/(58-82) 106/66 mmHg (12/22 1014) SpO2:  [92 %-98 %] 96 % (12/22 1211) FiO2 (%):  [28 %] 28 % (12/22 1211) Weight:  [67.9 kg (149 lb 11.1 oz)] 149 lb 11.1 oz (67.9 kg) (12/21 2300) Weight change:  Last BM Date: 02/03/11  Intake/Output from previous day: 12/21 0701 - 12/22 0700 In: -  Out: 700 [Urine:700]   Physical Exam: General: Alert, awake, oriented x3, in no acute distress. HEENT: No bruits, no goiter, tracheostomy with yellow secretions. Heart: Regular rate and rhythm, without murmurs, rubs, gallops. Lungs: conducted upper airway sounds Abdomen: Soft, nontender, nondistended, positive bowel sounds. Extremities: No clubbing cyanosis or edema with positive pedal pulses. Neuro: Grossly intact, nonfocal.  Lab Results: Basic Metabolic Panel:  Basename 02/04/11 0600 02/03/11 1715  NA 134* 131*  K 4.3 4.1  CL 97 91*  CO2 26 28  GLUCOSE 113* 112*  BUN 14 13  CREATININE 0.93 0.97  CALCIUM 9.8 10.3  MG -- --  PHOS -- --   Liver Function Tests:  Basename 02/03/11 1715  AST 26  ALT 30  ALKPHOS 183*  BILITOT 0.3  PROT 9.3*  ALBUMIN 3.1*   No results found for this basename: LIPASE:2,AMYLASE:2 in the last 72 hours  Basename 02/03/11 1729  AMMONIA 28   CBC:  Basename 02/04/11 0600 02/03/11 1715  WBC 8.8 12.7*  NEUTROABS -- 10.6*  HGB 11.7* 13.5  HCT 35.2* 39.4  MCV 89.8 89.3  PLT 402* 400   Cardiac Enzymes: No results found for this basename: CKTOTAL:3,CKMB:3,CKMBINDEX:3,TROPONINI:3 in the last 72 hours BNP: No components found with this basename: POCBNP:3 D-Dimer: No results found for this basename: DDIMER:2 in the last 72 hours CBG: No results found for this basename: GLUCAP:6 in  the last 72 hours Hemoglobin A1C: No results found for this basename: HGBA1C in the last 72 hours Fasting Lipid Panel: No results found for this basename: CHOL,HDL,LDLCALC,TRIG,CHOLHDL,LDLDIRECT in the last 72 hours Thyroid Function Tests: No results found for this basename: TSH,T4TOTAL,FREET4,T3FREE,THYROIDAB in the last 72 hours Anemia Panel: No results found for this basename: VITAMINB12,FOLATE,FERRITIN,TIBC,IRON,RETICCTPCT in the last 72 hours Coagulation: No results found for this basename: LABPROT:2,INR:2 in the last 72 hours Urine Drug Screen: Drugs of Abuse  No results found for this basename: labopia, cocainscrnur, labbenz, amphetmu, thcu, labbarb    Alcohol Level: No results found for this basename: ETH:2 in the last 72 hours  Recent Results (from the past 240 hour(s))  CULTURE, BLOOD (ROUTINE X 2)     Status: Normal (Preliminary result)   Collection Time   02/03/11  5:20 PM      Component Value Range Status Comment   Specimen Description BLOOD LEFT ANTECUBITAL   Final    Special Requests BOTTLES DRAWN AEROBIC AND ANAEROBIC 10CC   Final    Setup Time [id number]   Final    Culture     Final    Value:        BLOOD CULTURE RECEIVED NO GROWTH TO DATE CULTURE WILL BE HELD FOR 5 DAYS BEFORE ISSUING A FINAL NEGATIVE REPORT   Report Status PENDING   Incomplete   CULTURE, BLOOD (ROUTINE X 2)     Status: Normal (Preliminary result)   Collection Time  02/03/11  5:30 PM      Component Value Range Status Comment   Specimen Description BLOOD LEFT HAND   Final    Special Requests BOTTLES DRAWN AEROBIC ONLY 5CC   Final    Setup Time [id number]   Final    Culture     Final    Value:        BLOOD CULTURE RECEIVED NO GROWTH TO DATE CULTURE WILL BE HELD FOR 5 DAYS BEFORE ISSUING A FINAL NEGATIVE REPORT   Report Status PENDING   Incomplete   GRAM STAIN     Status: Normal   Collection Time   02/04/11  4:55 AM      Component Value Range Status Comment   Specimen Description URINE,  RANDOM   Final    Special Requests NONE   Final    Gram Stain     Final    Value: CYTOSPIN PREP SLIDE     NO WBC SEEN     NEGATIVE FOR BACTERIA   Report Status 02/04/2011 FINAL   Final     Studies/Results: Dg Chest 2 View  02/03/2011  *RADIOLOGY REPORT*  Clinical Data: Chest pain, congestion, productive cough  CHEST - 2 VIEW  Comparison: 09/10/2010  Findings: Tracheostomy tube projects over tracheal air column. Upper normal heart size. Mediastinal contours and pulmonary vascularity normal. Surgical clips project over right apex. Bronchitic changes with bibasilar atelectasis. Subtle left basilar infiltrate not excluded. No pleural effusion or pneumothorax. Prior plating of right mandible.  IMPRESSION: Bronchitic changes with probable bibasilar atelectasis though subtle left base infiltrate is not completely excluded.  Original Report Authenticated By: Lollie Marrow, M.D.   Ct Head Wo Contrast  02/03/2011  *RADIOLOGY REPORT*  Clinical Data: CVA  CT HEAD WITHOUT CONTRAST  Technique:  Contiguous axial images were obtained from the base of the skull through the vertex without contrast.  Comparison: 03/24/2022 12  Findings: No There is no evidence for acute hemorrhage, hydrocephalus, mass lesion, or abnormal extra-axial fluid collection.  No definite CT evidence for acute infarction. Fluid is visualized in the right mastoid air cells, new in the interval.  IMPRESSION: No acute intracranial abnormality.  New right mastoid air cell effusion.  Original Report Authenticated By: ERIC A. MANSELL, M.D.    Medications: Scheduled Meds:   . acetaminophen (TYLENOL) oral liquid 160 mg/5 mL  650 mg Oral Once  . azithromycin  500 mg Intravenous Once  . azithromycin  500 mg Intravenous Q24H  . cefTRIAXone (ROCEPHIN)  IV  1 g Intravenous Once  . cefTRIAXone (ROCEPHIN) IM  1 g Intramuscular Q24H  . enoxaparin  40 mg Subcutaneous Q24H  . feeding supplement (JEVITY 1.2)  237 mL Per Tube QID  . ranitidine  150 mg  Per Tube BID  . sodium chloride  1,000 mL Intravenous Once  . DISCONTD: albuterol  2.5 mg Nebulization Q6H  . DISCONTD: ranitidine  150 mg Oral BID   Continuous Infusions:   . sodium chloride 75 mL/hr at 02/04/11 0000   PRN Meds:.acetaminophen (TYLENOL) oral liquid 160 mg/5 mL, acetaminophen, acetaminophen, albuterol, alum & mag hydroxide-simeth, HYDROmorphone, ondansetron (ZOFRAN) IV, ondansetron, oxyCODONE, zolpidem, DISCONTD: albuterol  Assessment/Plan: 1. Pneumonia: continue Rocephin/zithromax, nebs for pulmonary toilet Follow up sputum, blood Cx 2. Tracheostomy care, suction PRN 3. Nutrition: restart tube feeds 4. DVT prophylaxis: lovenox    LOS: 1 day   [facility] Pager: [phone] 02/04/2011, 12:37 PM

## 2011-02-05 ENCOUNTER — Inpatient Hospital Stay (HOSPITAL_COMMUNITY): Payer: Medicare HMO

## 2011-02-05 NOTE — Progress Notes (Signed)
Subjective: Feeling better, still with moderate amount of secretions  Objective: Vital signs in last 24 hours: Temp:  [95.5 F (35.3 C)-99.4 F (37.4 C)] 99.1 F (37.3 C) (12/23 0942) Pulse Rate:  [69-95] 91  (12/23 1159) Resp:  [16-19] 18  (12/23 1159) BP: (90-157)/(59-82) 118/65 mmHg (12/23 0942) SpO2:  [85 %-100 %] 97 % (12/23 1159) FiO2 (%):  [28 %] 28 % (12/23 0436) Weight change:  Last BM Date: 02/04/11  Intake/Output from previous day: 12/22 0701 - 12/23 0700 In: 600 [I.V.:600] Out: -    Physical Exam: General: Alert, awake, oriented x3, in no acute distress. HEENT: No bruits, no goiter, tracheostomy with yellow secretions. Heart: Regular rate and rhythm, without murmurs, rubs, gallops. Lungs: conducted upper airway sounds Abdomen: Soft, nontender, nondistended, positive bowel sounds. Extremities: No clubbing cyanosis or edema with positive pedal pulses. Neuro: Grossly intact, nonfocal.  Lab Results: Basic Metabolic Panel:  Basename 02/04/11 0600 02/03/11 1715  NA 134* 131*  K 4.3 4.1  CL 97 91*  CO2 26 28  GLUCOSE 113* 112*  BUN 14 13  CREATININE 0.93 0.97  CALCIUM 9.8 10.3  MG -- --  PHOS -- --   Liver Function Tests:  Basename 02/03/11 1715  AST 26  ALT 30  ALKPHOS 183*  BILITOT 0.3  PROT 9.3*  ALBUMIN 3.1*   No results found for this basename: LIPASE:2,AMYLASE:2 in the last 72 hours  Basename 02/03/11 1729  AMMONIA 28   CBC:  Basename 02/04/11 0600 02/03/11 1715  WBC 8.8 12.7*  NEUTROABS -- 10.6*  HGB 11.7* 13.5  HCT 35.2* 39.4  MCV 89.8 89.3  PLT 402* 400   Cardiac Enzymes: No results found for this basename: CKTOTAL:3,CKMB:3,CKMBINDEX:3,TROPONINI:3 in the last 72 hours BNP: No components found with this basename: POCBNP:3 D-Dimer: No results found for this basename: DDIMER:2 in the last 72 hours CBG: No results found for this basename: GLUCAP:6 in the last 72 hours Hemoglobin A1C: No results found for this basename: HGBA1C  in the last 72 hours Fasting Lipid Panel: No results found for this basename: CHOL,HDL,LDLCALC,TRIG,CHOLHDL,LDLDIRECT in the last 72 hours Thyroid Function Tests: No results found for this basename: TSH,T4TOTAL,FREET4,T3FREE,THYROIDAB in the last 72 hours Anemia Panel: No results found for this basename: VITAMINB12,FOLATE,FERRITIN,TIBC,IRON,RETICCTPCT in the last 72 hours Coagulation: No results found for this basename: LABPROT:2,INR:2 in the last 72 hours Urine Drug Screen: Drugs of Abuse  No results found for this basename: labopia,  cocainscrnur,  labbenz,  amphetmu,  thcu,  labbarb    Alcohol Level: No results found for this basename: ETH:2 in the last 72 hours  Recent Results (from the past 240 hour(s))  CULTURE, BLOOD (ROUTINE X 2)     Status: Normal (Preliminary result)   Collection Time   02/03/11  5:20 PM      Component Value Range Status Comment   Specimen Description BLOOD LEFT ANTECUBITAL   Final    Special Requests BOTTLES DRAWN AEROBIC AND ANAEROBIC 10CC   Final    Setup Time [id number]   Final    Culture     Final    Value:        BLOOD CULTURE RECEIVED NO GROWTH TO DATE CULTURE WILL BE HELD FOR 5 DAYS BEFORE ISSUING A FINAL NEGATIVE REPORT   Report Status PENDING   Incomplete   CULTURE, BLOOD (ROUTINE X 2)     Status: Normal (Preliminary result)   Collection Time   02/03/11  5:30 PM  Component Value Range Status Comment   Specimen Description BLOOD LEFT HAND   Final    Special Requests BOTTLES DRAWN AEROBIC ONLY 5CC   Final    Setup Time [id number]   Final    Culture     Final    Value:        BLOOD CULTURE RECEIVED NO GROWTH TO DATE CULTURE WILL BE HELD FOR 5 DAYS BEFORE ISSUING A FINAL NEGATIVE REPORT   Report Status PENDING   Incomplete   CULTURE, RESPIRATORY     Status: Normal (Preliminary result)   Collection Time   02/04/11  4:55 AM      Component Value Range Status Comment   Specimen Description ENDOTRACHEAL ASPIRATE   Final    Special Requests  NONE   Final    Gram Stain     Final    Value: MODERATE WBC PRESENT, PREDOMINANTLY PMN     FEW SQUAMOUS EPITHELIAL CELLS PRESENT     FEW GRAM NEGATIVE RODS     FEW GRAM POSITIVE RODS     FEW GRAM POSITIVE COCCI IN PAIRS   Culture PENDING   Incomplete    Report Status PENDING   Incomplete   GRAM STAIN     Status: Normal   Collection Time   02/04/11  4:55 AM      Component Value Range Status Comment   Specimen Description URINE, RANDOM   Final    Special Requests NONE   Final    Gram Stain     Final    Value: CYTOSPIN PREP SLIDE     NO WBC SEEN     NEGATIVE FOR BACTERIA   Report Status 02/04/2011 FINAL   Final     Studies/Results: Dg Chest 2 View  02/05/2011  *RADIOLOGY REPORT*  Clinical Data: Pneumonia, congestion  CHEST - 2 VIEW  Comparison: Chest radiograph 02/03/2011  Findings: Tracheostomy tube unchanged.  Stable cardiac silhouette. There is air space disease in the left mid lung which is unchanged from prior.  No pleural fluid.  No pneumothorax.  IMPRESSION:  1. No  interval change. 2.  Left upper lobe pneumonia. Recommend follow-up radiographs to demonstrate resolution.  Original Report Authenticated By: Genevive Bi, M.D.   Dg Chest 2 View  02/03/2011  *RADIOLOGY REPORT*  Clinical Data: Chest pain, congestion, productive cough  CHEST - 2 VIEW  Comparison: 09/10/2010  Findings: Tracheostomy tube projects over tracheal air column. Upper normal heart size. Mediastinal contours and pulmonary vascularity normal. Surgical clips project over right apex. Bronchitic changes with bibasilar atelectasis. Subtle left basilar infiltrate not excluded. No pleural effusion or pneumothorax. Prior plating of right mandible.  IMPRESSION: Bronchitic changes with probable bibasilar atelectasis though subtle left base infiltrate is not completely excluded.  Original Report Authenticated By: Lollie Marrow, M.D.   Ct Head Wo Contrast  02/03/2011  *RADIOLOGY REPORT*  Clinical Data: CVA  CT HEAD  WITHOUT CONTRAST  Technique:  Contiguous axial images were obtained from the base of the skull through the vertex without contrast.  Comparison: 03/24/2022 12  Findings: No There is no evidence for acute hemorrhage, hydrocephalus, mass lesion, or abnormal extra-axial fluid collection.  No definite CT evidence for acute infarction. Fluid is visualized in the right mastoid air cells, new in the interval.  IMPRESSION: No acute intracranial abnormality.  New right mastoid air cell effusion.  Original Report Authenticated By: ERIC A. MANSELL, M.D.    Medications: Scheduled Meds:    . azithromycin  500 mg  Intravenous Q24H  . cefTRIAXone (ROCEPHIN)  IV  1 g Intravenous Q24H  . enoxaparin  40 mg Subcutaneous Q24H  . feeding supplement (JEVITY 1.2)  237 mL Per Tube QID  . ranitidine  150 mg Per Tube BID  . DISCONTD: cefTRIAXone (ROCEPHIN) IM  1 g Intramuscular Q24H   Continuous Infusions:    . sodium chloride 75 mL/hr at 02/05/11 0737   PRN Meds:.acetaminophen (TYLENOL) oral liquid 160 mg/5 mL, acetaminophen, acetaminophen, albuterol, alum & mag hydroxide-simeth, HYDROmorphone, ondansetron (ZOFRAN) IV, ondansetron, oxyCODONE, zolpidem  Assessment/Plan: 1. Pneumonia: continue Rocephin/zithromax, nebs for pulmonary toilet Sputum cx polymicrobial, blood Cx pending 2. Tracheostomy care, suction PRN 3. Nutrition: restart tube feeds 4. DVT prophylaxis: lovenox    LOS: 2 days   Khyle Goodell [facility] Pager: [phone] 02/05/2011, 12:57 PM

## 2011-02-06 LAB — CULTURE, RESPIRATORY W GRAM STAIN

## 2011-02-06 MED ORDER — LEVOFLOXACIN 500 MG PO TABS: 500.0000 mg | ORAL_TABLET | Freq: Every day | ORAL | Status: AC

## 2011-02-06 NOTE — Progress Notes (Signed)
Utilization Review Completed.  Sisto Granillo T  02/06/2011 

## 2011-02-06 NOTE — Progress Notes (Signed)
   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 spoke w pt's nse who did talk w his nse from [place] and they will resume services   Anticipated DC Date:  02/06/2011   Anticipated DC Plan:  HOME W HOME HEALTH SERVICES      DC Planning Services  CM consult      [facility] Choice  Resumption Of Svcs/PTA Provider   Choice offered to / List presented to:          [facility] arranged  HH-1 RN      [facility] agency  [facility]   Status of service:   Medicare Important Message given?   (If response is "NO", the following Medicare IM given date fields will be blank) Date Medicare IM given:   Date Additional Medicare IM given:    Discharge Disposition:  HOME W HOME HEALTH SERVICES  Per UR Regulation:  Reviewed for med. necessity/level of care/duration of stay  Comments:  12/24 debbie Quamaine Webb rn,bsn

## 2011-02-08 NOTE — ED Provider Notes (Signed)
  Evaluation and management procedures were performed by the resident physician under my supervision/collaboration.  I evaluated this patient face-to-face at the time of encounter.  Please see my note dated at that time.  Felisa Bonier, MD 02/08/11 [phone]

## 2011-02-09 LAB — CULTURE, BLOOD (ROUTINE X 2): Culture  Setup Time: 201212212255

## 2011-02-12 NOTE — Discharge Summary (Signed)
Physician Discharge Summary  Patient ID: Nicholas Cobb MRN: [id number] DOB/AGE: February 26, 1950 60 y.o.  Admit date: 02/03/2011 Discharge date: 02/12/2011  Primary Care Physician:  No primary provider on file.  Discharge Diagnoses:   1.  Pneumonia 2. Status post Tracheostomy 3. Status post PEG (percutaneous endoscopic gastrostomy) 4. H/o laryngeal cancer s/p total laryngectomy 5. Metabolic encephalopathy due to pneumonia 6. GERD 7. Chronic pain  Discharge Medication List as of 02/06/2011  8:43 AM    START taking these medications   Details  levofloxacin (LEVAQUIN) 500 MG tablet Place 1 tablet (500 mg total) into feeding tube daily., Starting 02/06/2011, Until Mon 02/13/11, Normal      CONTINUE these medications which have NOT CHANGED   Details  HYDROcodone-acetaminophen (LORCET) 10-650 MG per tablet Take 1 tablet by mouth every 8 (eight) hours as needed. For pain., Until Discontinued, Historical Med    ranitidine (ZANTAC) 150 MG tablet Take 150 mg by mouth 2 (two) times daily.  , Until Discontinued, Historical Med       Disposition and Follow-up:  PCP in 1 week  Consults: none  Significant Diagnostic Studies:  1. Dg Chest 2 View 02/03/2011  *RADIOLOGY REPORT* .  IMPRESSION: Bronchitic changes with probable bibasilar atelectasis though subtle left base infiltrate is not completely excluded.  Original Report Authenticated By: Lollie Marrow, M.D.   2. Ct Head Wo Contrast 02/03/2011  *RADIOLOGY REPORT*  IMPRESSION: No acute intracranial abnormality.  New right mastoid air cell effusion.  Original Report Authenticated By: ERIC A. MANSELL, M.D.   Brief H and P: [name] is a 60/M with h/o laryngeal cancer s/p total laryngectomy with a tracheostomy and PEG was brought to the ER with fever, chills and confusion. On further evaluation he was found to have community-acquired pneumonia and leukocytosis.  Hospital Course:  1. community-acquired pneumonia: Originally treated  with IV Rocephin, azithromycin, oxygen. Subsequently also required nebulizers for pulmonary toilet, then transitioned to by mouth levofloxacin. Clinically improved with resolution of fevers leukocytosis and improvement in respiratory symptoms. Discharged home to complete a ten-day course of Levaquin. 2. Tracheostomy in place 3.  S/p  PEG (percutaneous endoscopic gastrostomy): Continued on his tube feeds as prior 4. history of laryngeal cancer status post total laryngectomy advised to followup with his oncologist.  Time spent on Discharge:  Signed: Raeann Offner [facility]  02/12/2011, 10:54 PM

## 2011-02-23 ENCOUNTER — Ambulatory Visit
Admission: RE | Admit: 2011-02-23 | Discharge: 2011-02-23 | Disposition: A | Discharge status: Home / Self Care | Payer: Medicare HMO | Source: Ambulatory Visit | Attending: Internal Medicine | Admitting: Internal Medicine

## 2011-02-23 ENCOUNTER — Ambulatory Visit
Admission: RE | Admit: 2011-02-23 | Discharge: 2011-02-23 | Disposition: A | Discharge status: Home / Self Care | Payer: Medicare Other | Source: Ambulatory Visit | Attending: Internal Medicine | Admitting: Internal Medicine

## 2011-02-23 DIAGNOSIS — R51 Headache: Secondary | ICD-10-CM

## 2011-02-23 MED ORDER — IOHEXOL 300 MG/ML  SOLN
75.0000 mL | Freq: Once | INTRAMUSCULAR | Status: AC | PRN
  Administered 2011-02-23: 75 mL via INTRAVENOUS

## 2011-03-25 ENCOUNTER — Encounter: Payer: Self-pay | Admitting: Internal Medicine

## 2011-03-25 DIAGNOSIS — R682 Dry mouth, unspecified: Secondary | ICD-10-CM

## 2011-03-25 DIAGNOSIS — K117 Disturbances of salivary secretion: Secondary | ICD-10-CM

## 2011-03-25 DIAGNOSIS — G8929 Other chronic pain: Secondary | ICD-10-CM

## 2011-03-25 DIAGNOSIS — Y842 Radiological procedure and radiotherapy as the cause of abnormal reaction of the patient, or of later complication, without mention of misadventure at the time of the procedure: Secondary | ICD-10-CM

## 2011-03-25 DIAGNOSIS — J439 Emphysema, unspecified: Secondary | ICD-10-CM | POA: Insufficient documentation

## 2011-03-25 DIAGNOSIS — K219 Gastro-esophageal reflux disease without esophagitis: Secondary | ICD-10-CM

## 2011-03-25 DIAGNOSIS — Z0000 Encounter for general adult medical examination without abnormal findings: Secondary | ICD-10-CM | POA: Insufficient documentation

## 2011-03-25 DIAGNOSIS — M272 Inflammatory conditions of jaws: Secondary | ICD-10-CM

## 2011-03-25 DIAGNOSIS — M47812 Spondylosis without myelopathy or radiculopathy, cervical region: Secondary | ICD-10-CM

## 2011-03-25 HISTORY — DX: Spondylosis without myelopathy or radiculopathy, cervical region: M47.812

## 2011-03-25 HISTORY — DX: Gastro-esophageal reflux disease without esophagitis: K21.9

## 2011-03-25 HISTORY — DX: Disturbances of salivary secretion: K11.7

## 2011-03-25 HISTORY — DX: Radiological procedure and radiotherapy as the cause of abnormal reaction of the patient, or of later complication, without mention of misadventure at the time of the procedure: Y84.2

## 2011-03-25 HISTORY — DX: Inflammatory conditions of jaws: M27.2

## 2011-03-25 HISTORY — DX: Other chronic pain: G89.29

## 2011-03-30 ENCOUNTER — Ambulatory Visit: Payer: Medicare HMO | Admitting: Internal Medicine

## 2011-04-18 ENCOUNTER — Other Ambulatory Visit: Payer: Self-pay | Admitting: Internal Medicine

## 2011-04-18 DIAGNOSIS — L0211 Cutaneous abscess of neck: Secondary | ICD-10-CM

## 2011-04-19 ENCOUNTER — Ambulatory Visit (INDEPENDENT_AMBULATORY_CARE_PROVIDER_SITE_OTHER): Payer: Medicare Other | Admitting: Internal Medicine

## 2011-04-19 ENCOUNTER — Encounter: Payer: Self-pay | Admitting: Internal Medicine

## 2011-04-19 VITALS — BP 112/72 | HR 74 | Temp 99.6°F | Ht 69.0 in | Wt 161.0 lb

## 2011-04-19 DIAGNOSIS — N529 Male erectile dysfunction, unspecified: Secondary | ICD-10-CM

## 2011-04-19 DIAGNOSIS — Z0000 Encounter for general adult medical examination without abnormal findings: Secondary | ICD-10-CM

## 2011-04-19 MED ORDER — SILDENAFIL CITRATE 100 MG PO TABS: 50.0000 mg | ORAL_TABLET | Freq: Every day | ORAL | Status: DC | PRN

## 2011-04-19 NOTE — Patient Instructions (Signed)
Please sign the release of information forms for your former general MD (Dr Corine Shelter) as well as the [facility] physician You are given the prescription for the 3 free Viagra, with refills on the prescription Continue all other medications as before Please have the pharmacy call if you need further refills, such as your pain medication Please go to LAB in the Basement for the blood and/or urine tests to be done today Please call the phone number [phone] (the [employer] System) for results of testing in 2-3 days;  When calling, simply dial the number, and when prompted enter the MRN number above (the Medical Record Number) and the # key, then the message should start. You are otherwise up to date with prevention today Please keep your appointments with your specialists as you have planned - the CT for later this week (Friday) per your [facility] Please return in 3 months, or sooner if needed

## 2011-04-21 ENCOUNTER — Other Ambulatory Visit: Payer: Medicare Other

## 2011-04-23 ENCOUNTER — Encounter: Payer: Self-pay | Admitting: Internal Medicine

## 2011-04-23 NOTE — Assessment & Plan Note (Signed)
For viagra prn,  to f/u any worsening symptoms or concerns 

## 2011-04-23 NOTE — Progress Notes (Signed)
Subjective:    Patient ID: Nicholas Cobb, male    DOB: 04-28-1950, 61 y.o.   MRN: [id number]  HPI  Here for wellness and f/u;  Overall doing ok;  Pt denies CP, worsening SOB, DOE, wheezing, orthopnea, PND, worsening LE edema, palpitations, dizziness or syncope.  Pt denies neurological change such as new Headache, facial or extremity weakness.  Pt denies polydipsia, polyuria, or low sugar symptoms. Pt states overall good compliance with treatment and medications, good tolerability, and trying to follow lower cholesterol diet.  Pt denies worsening depressive symptoms, suicidal ideation or panic. No fever, wt loss, night sweats, loss of appetite, or other constitutional symptoms.  Pt states good ability with ADL's, low fall risk, home safety reviewed and adequate, no significant changes in hearing or vision, and occasionally active with exercise.  Has ongoing Ed symtpoms.  Does have laryngeal Ca for CT later this wk per [facility] to determine need for further surgury and/or hyperbaric tx per pt Past Medical History  Diagnosis Date  . Hearing loss   . Change in voice   . Rash   . Trouble swallowing   . Difficulty urinating   . Cancer 1999    throat cancer  . Cervical spondylosis 03/25/2011  . Emphysema 03/25/2011  . GERD (gastroesophageal reflux disease) 03/25/2011  . Chronic pain 03/25/2011  . 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 Xerostomia 03/25/2011  . Ulcer   . UTI (lower urinary tract infection)   . Allergy    Past Surgical History  Procedure Date  . Peg tube placement 11/1999  . Tracheostomy 11/1999  . Gastrostomy tube placement     reports that he quit smoking about 14 years ago. He has never used smokeless tobacco. He reports that he does not drink alcohol or use illicit drugs. family history includes Cancer in his other; Diabetes in his other; and Stroke in his other. Allergies  Allergen Reactions  . Codeine Nausea Only   Current Outpatient Prescriptions on File Prior to Visit    Medication Sig Dispense Refill  . HYDROcodone-acetaminophen (LORCET) 10-650 MG per tablet Take 1 tablet by mouth every 8 (eight) hours as needed. For pain.      . ranitidine (ZANTAC) 150 MG tablet Take 150 mg by mouth 2 (two) times daily.         Review of Systems Review of Systems  Constitutional: Negative for diaphoresis, activity change, appetite change and unexpected weight change.  HENT: Negative for hearing loss, ear pain, facial swelling, mouth sores and neck stiffness.   Eyes: Negative for pain, redness and visual disturbance.  Respiratory: Negative for shortness of breath and wheezing.   Cardiovascular: Negative for chest pain and palpitations.  Gastrointestinal: Negative for diarrhea, blood in stool, abdominal distention and rectal pain.  Genitourinary: Negative for hematuria, flank pain and decreased urine volume.  Musculoskeletal: Negative for myalgias and joint swelling.  Skin: Negative for color change and wound.  Neurological: Negative for syncope and numbness.  Hematological: Negative for adenopathy.  Psychiatric/Behavioral: Negative for hallucinations, self-injury, decreased concentration and agitation.      Objective:   Physical Exam BP 112/72  Pulse 74  Temp(Src) 99.6 F (37.6 C) (Oral)  Ht 5\' 9"  (1.753 m)  Wt 161 lb (73.029 kg)  BMI 23.78 kg/m2  SpO2 95% Physical Exam  VS noted,  Constitutional: Pt is oriented to person, place, and time. Appears somewhat thin.  HENT:  Head: Normocephalic and atraumatic.  Right Ear: External ear normal.  Left Ear:  External ear normal.  Nose: Nose normal.  Mouth/Throat: Oropharynx is clear and moist.  Eyes: Conjunctivae and EOM are normal. Pupils are equal, round, and reactive to light.  Neck: Normal range of motion. Neck supple. No JVD present. No tracheal deviation present.  Cardiovascular: Normal rate, regular rhythm, normal heart sounds and intact distal pulses.   Pulmonary/Chest: Effort normal and breath sounds  normal.  Abdominal: Soft. Bowel sounds are normal. There is no tenderness.  Musculoskeletal: Normal range of motion. Exhibits no edema.  Lymphadenopathy:  Has no cervical adenopathy.  Neurological: Pt is alert and oriented to person, place, and time. Pt has normal reflexes. No cranial nerve deficit.  Skin: Skin is warm and dry. No rash noted.  Psychiatric:  Has  normal mood and affect. Behavior is normal.     Assessment & Plan:

## 2011-04-23 NOTE — Assessment & Plan Note (Signed)

## 2011-04-24 ENCOUNTER — Ambulatory Visit
Admission: RE | Admit: 2011-04-24 | Discharge: 2011-04-24 | Disposition: A | Discharge status: Home / Self Care | Payer: Medicare Other | Source: Ambulatory Visit | Attending: Internal Medicine | Admitting: Internal Medicine

## 2011-04-24 DIAGNOSIS — L0211 Cutaneous abscess of neck: Secondary | ICD-10-CM

## 2011-04-27 ENCOUNTER — Encounter: Payer: Self-pay | Admitting: Internal Medicine

## 2011-04-27 ENCOUNTER — Ambulatory Visit (INDEPENDENT_AMBULATORY_CARE_PROVIDER_SITE_OTHER): Payer: Medicare Other | Admitting: Internal Medicine

## 2011-04-27 VITALS — BP 120/80 | HR 76 | Temp 100.2°F | Ht 69.0 in | Wt 163.0 lb

## 2011-04-27 DIAGNOSIS — R7309 Other abnormal glucose: Secondary | ICD-10-CM

## 2011-04-27 DIAGNOSIS — IMO0001 Reserved for inherently not codable concepts without codable children: Secondary | ICD-10-CM

## 2011-04-27 DIAGNOSIS — J329 Chronic sinusitis, unspecified: Secondary | ICD-10-CM

## 2011-04-27 DIAGNOSIS — Z930 Tracheostomy status: Secondary | ICD-10-CM

## 2011-04-27 DIAGNOSIS — E119 Type 2 diabetes mellitus without complications: Secondary | ICD-10-CM | POA: Insufficient documentation

## 2011-04-27 DIAGNOSIS — R7302 Impaired glucose tolerance (oral): Secondary | ICD-10-CM

## 2011-04-27 DIAGNOSIS — J209 Acute bronchitis, unspecified: Secondary | ICD-10-CM

## 2011-04-27 HISTORY — DX: Impaired glucose tolerance (oral): R73.02

## 2011-04-27 HISTORY — DX: Reserved for inherently not codable concepts without codable children: IMO0001

## 2011-04-27 MED ORDER — AZITHROMYCIN 250 MG PO TABS: ORAL_TABLET | ORAL | Status: AC

## 2011-04-27 NOTE — Patient Instructions (Addendum)
We will fax the order for trach collar change and clean weekly later today Please go to LAB in the Basement for the blood and/or urine tests to be done today; this should be ok with your insurance with the code we used to order the tests (V70.0) If you like, please contact your insurance to make sure this is ok and no money out of pocket will come from you, and return at your convenience to have your tests done. You will be contacted by phone if any changes need to be made immediately.  Otherwise, you will receive a letter about your results with an explanation. Take all new medications as prescribed - the antibiotic  Continue all other medications as before We will review the records you gave [place] today, to copy and scan the most important for your computer file You should then be notified when to pick up your original records

## 2011-04-27 NOTE — Assessment & Plan Note (Signed)
Order to [facility] for change and clean trach collar weekly to be faxed,  to f/u any worsening symptoms or concerns

## 2011-04-30 ENCOUNTER — Encounter: Payer: Self-pay | Admitting: Internal Medicine

## 2011-04-30 DIAGNOSIS — M4302 Spondylolysis, cervical region: Secondary | ICD-10-CM

## 2011-04-30 HISTORY — DX: Spondylolysis, cervical region: M43.02

## 2011-04-30 NOTE — Assessment & Plan Note (Signed)
stable overall by hx and exam, most recent data reviewed with pt, and pt to continue medical treatment as before  Lab Results  Component Value Date   WBC 8.8 02/04/2011   HGB 11.7* 02/04/2011   HCT 35.2* 02/04/2011   PLT 402* 02/04/2011   GLUCOSE 113* 02/04/2011   ALT 30 02/03/2011   AST 26 02/03/2011   NA 134* 02/04/2011   K 4.3 02/04/2011   CL 97 02/04/2011   CREATININE 0.93 02/04/2011   BUN 14 02/04/2011   CO2 26 02/04/2011

## 2011-04-30 NOTE — Assessment & Plan Note (Signed)
Ok for otc allegra prn,  to f/u any worsening symptoms or concerns 

## 2011-04-30 NOTE — Progress Notes (Signed)
Subjective:    Patient ID: Nicholas Cobb, male    DOB: Jul 13, 1950, 61 y.o.   MRN: [id number]  HPI  Here with acute onset mild to mod 2-3 days ST, HA, general weakness and malaise, with prod cough greenish sputum, but Pt denies chest pain, increased sob or doe, wheezing, orthopnea, PND, increased LE swelling, palpitations, dizziness or syncope.  Does have several wks ongoing left nasal allergy symptoms with clear congestion, itch and sneeze.  Pt denies chest pain, increased sob or doe, wheezing, orthopnea, PND, increased LE swelling, palpitations, dizziness or syncope.  Pt denies new neurological symptoms such as new headache, or facial or extremity weakness or numbness  Pt denies polydipsia, polyuria,.  Pt states overall good compliance with meds.  Hx difficult due to raspiness of speech with covering the trach.  Did not have his labs done last visit as he was worried they would not be paid for, he is afraid of paying part of it Past Medical History  Diagnosis Date  . Hearing loss   . Change in voice   . Rash   . Trouble swallowing   . Difficulty urinating   . Cancer 1999    throat cancer  . Cervical spondylosis 03/25/2011  . Emphysema 03/25/2011  . GERD (gastroesophageal reflux disease) 03/25/2011  . Chronic pain 03/25/2011  . 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 Xerostomia 03/25/2011  . Ulcer   . UTI (lower urinary tract infection)   . Allergy   . Chronic sinusitis 04/27/2011  . Impaired glucose tolerance 04/27/2011   Past Surgical History  Procedure Date  . Peg tube placement 11/1999  . Tracheostomy 11/1999  . Gastrostomy tube placement     reports that he quit smoking about 14 years ago. He has never used smokeless tobacco. He reports that he does not drink alcohol or use illicit drugs. family history includes Cancer in his other; Diabetes in his other; and Stroke in his other. Allergies  Allergen Reactions  . Codeine Nausea Only   Current Outpatient Prescriptions on File Prior to  Visit  Medication Sig Dispense Refill  . HYDROcodone-acetaminophen (LORCET) 10-650 MG per tablet Take 1 tablet by mouth every 8 (eight) hours as needed. For pain.      . ranitidine (ZANTAC) 150 MG tablet Take 150 mg by mouth 2 (two) times daily.        . sildenafil (VIAGRA) 100 MG tablet Take 0.5-1 tablets (50-100 mg total) by mouth daily as needed for erectile dysfunction.  3 tablet  11   Review of Systems Review of Systems  Constitutional: Negative for diaphoresis and unexpected weight change.  HENT: Negative for drooling and tinnitus.   Eyes: Negative for photophobia and visual disturbance.  Respiratory: Negative for choking and stridor.   Gastrointestinal: Negative for vomiting and blood in stool.  Genitourinary: Negative for hematuria and decreased urine volume.     Objective:   Physical Exam BP 120/80  Pulse 76  Temp(Src) 100.2 F (37.9 C) (Oral)  Ht 5\' 9"  (1.753 m)  Wt 163 lb (73.936 kg)  BMI 24.07 kg/m2  SpO2 98% Physical Exam  VS noted, mild ill Constitutional: Pt appears well-developed and well-nourished.  HENT: Head: Normocephalic.  Right Ear: External ear normal.  Left Ear: External ear normal.  Bilat tm's mild erythema.  Sinus nontender.  Pharynx mild erythema Eyes: Conjunctivae and EOM are normal. Pupils are equal, round, and reactive to light.  Neck: Normal range of motion. Neck supple.  Cardiovascular: Normal rate and  regular rhythm.   Pulmonary/Chest: Effort normal and breath sounds normal.  Skin: Skin is warm. No erythema.  Psychiatric: Pt behavior is normal. Thought content normal. except 1+ nervous    Assessment & Plan:

## 2011-04-30 NOTE — Assessment & Plan Note (Signed)
Mild to mod, for antibx course,  to f/u any worsening symptoms or concerns 

## 2011-05-01 ENCOUNTER — Telehealth: Payer: Self-pay

## 2011-05-01 DIAGNOSIS — Z930 Tracheostomy status: Secondary | ICD-10-CM

## 2011-05-01 NOTE — Telephone Encounter (Signed)
Faxed order to fax number on form [phone]. Called left message for the patient to call back.

## 2011-05-01 NOTE — Telephone Encounter (Signed)
Pt called requesting referral to home health - he says that he discussed this with you at the last office visit and gave you the name of the agency his Insurance company designated.

## 2011-05-01 NOTE — Telephone Encounter (Signed)
I did the order for the trach collar change and cleaning as he requested, gave it to robin who faxed it same day  If he means something else, please let us know

## 2011-05-02 NOTE — Telephone Encounter (Signed)
Patient called back informed have refaxed order to [facility], callled [facility] to confirm received fax, left message to call back

## 2011-05-02 NOTE — Telephone Encounter (Signed)
Called the patient left detailed message that order has now been faxed two times as followed up with [facility] and will followup with them.

## 2011-05-02 NOTE — Telephone Encounter (Signed)
Patient informed. 

## 2011-05-02 NOTE — Telephone Encounter (Signed)
Please call pt, he has returned your call several times today.

## 2011-05-02 NOTE — Telephone Encounter (Signed)
New referral to [facility] done  - to [facility]

## 2011-05-02 NOTE — Telephone Encounter (Signed)
Followed up with both [facility] and [facility] (both were listed on [facility] letter patient received). [facility] does not have him in their system from [facility] as a referral and cannot see him and [facility] does not does not have anyone that does Trach change and cleaning. Please advise.

## 2011-05-02 NOTE — Telephone Encounter (Signed)
[facility] and refaxed order along with the letter the patient received from [facility] as they do not have the patient at this time in their system.

## 2011-05-08 ENCOUNTER — Telehealth: Payer: Self-pay | Admitting: Internal Medicine

## 2011-05-08 NOTE — Telephone Encounter (Signed)
Received call from [facility] they do not do trach changing and cleaning. Said that normally pt goes back to doctor that did the trach. Please advise.  Thanks

## 2011-05-08 NOTE — Telephone Encounter (Signed)
Robin to let pt know the above  We can refer to local ent if necessary if this needs to be arranged per local ent

## 2011-05-09 NOTE — Telephone Encounter (Signed)
All I can suggest is to see the ENT for his trach collar needs in [place], or I can refer to local ENT

## 2011-05-09 NOTE — Telephone Encounter (Signed)
Called informed the patient of MD's instructions. He informed he has ENT in [place] he sees and can followup with that MD. Also stated his insurance company is trying to find someone for him also

## 2011-05-10 NOTE — Telephone Encounter (Signed)
Spoke to the patient he would like to see his ENT in [place].

## 2011-05-10 NOTE — Telephone Encounter (Signed)
Does he need referral, or can he do this on his own?  If needs referral, I would need a name

## 2011-05-10 NOTE — Telephone Encounter (Signed)
Called left message to call back 

## 2011-05-11 NOTE — Telephone Encounter (Signed)
Called the patient and he can schedule with his ENT in [facility].

## 2011-05-16 ENCOUNTER — Telehealth: Payer: Self-pay

## 2011-05-16 NOTE — Telephone Encounter (Signed)
Called the patient to inform to pickup his chart with his medical records. Informed Dr. Jonny Ruiz has copied the information he needed. Patient stated he would be in next week to pickkup.

## 2011-06-21 ENCOUNTER — Encounter: Payer: Self-pay | Admitting: Internal Medicine

## 2011-06-21 ENCOUNTER — Ambulatory Visit (INDEPENDENT_AMBULATORY_CARE_PROVIDER_SITE_OTHER): Payer: Medicare Other | Admitting: Internal Medicine

## 2011-06-21 VITALS — BP 120/70 | HR 77 | Temp 100.3°F | Wt 168.2 lb

## 2011-06-21 DIAGNOSIS — R351 Nocturia: Secondary | ICD-10-CM

## 2011-06-21 DIAGNOSIS — R7309 Other abnormal glucose: Secondary | ICD-10-CM

## 2011-06-21 DIAGNOSIS — J438 Other emphysema: Secondary | ICD-10-CM

## 2011-06-21 DIAGNOSIS — Z0000 Encounter for general adult medical examination without abnormal findings: Secondary | ICD-10-CM

## 2011-06-21 DIAGNOSIS — J069 Acute upper respiratory infection, unspecified: Secondary | ICD-10-CM

## 2011-06-21 DIAGNOSIS — R7302 Impaired glucose tolerance (oral): Secondary | ICD-10-CM

## 2011-06-21 DIAGNOSIS — G8929 Other chronic pain: Secondary | ICD-10-CM

## 2011-06-21 MED ORDER — AMOXICILLIN 500 MG PO CAPS: ORAL_CAPSULE | ORAL | Status: DC

## 2011-06-21 NOTE — Patient Instructions (Signed)
Take all new medications as prescribed - the antibiotic Continue all other medications as before Please have the pharmacy call with any refills you may need. Please keep your appointments with your specialists as you have planned - Dr Karen Kitchens will be contacted regarding the referral for: urology for the prostate Please go to LAB in the Basement for the blood and/or urine tests to be done on May 15 as you mentioned today Please return in 6 months, or sooner if needed

## 2011-06-28 DIAGNOSIS — J069 Acute upper respiratory infection, unspecified: Secondary | ICD-10-CM | POA: Insufficient documentation

## 2011-06-28 NOTE — Assessment & Plan Note (Signed)
Unclear etiology, needs UA, glc and PSA but does not want to do today, promises May 15 for reason I cant quite understand; also refer urology per pt request

## 2011-06-28 NOTE — Progress Notes (Signed)
Subjective:    Patient ID: Nicholas Cobb, male    DOB: [birth year], 61 y.o.   MRN: [id number]  HPI  Here to f/u;  Did not have labs done last visit, for some reason does not want to today but promised to do on May 15;   Here with 3 days acute onset fever, facial pain, pressure, general weakness and malaise, and greenish d/c, with slight ST, but little to no cough and Pt denies chest pain, increased sob or doe, wheezing, orthopnea, PND, increased LE swelling, palpitations, dizziness or syncope.  Did have the CT done mar 2013, but not yet seen per Dr Claire Shown for evaluation for further hyperbaric therapy.  Overall pain controlled.  Does have significant nocturia - 3-4 times per night for several months at least, somewhat vague about this.   Denies urinary symptoms such as dysuria, frequency, urgency,or hematuria.   Past Medical History  Diagnosis Date  . Hearing loss   . Change in voice   . Rash   . Trouble swallowing   . Difficulty urinating   . Cancer 1999    throat cancer  . Cervical spondylosis 03/25/2011  . Emphysema 03/25/2011  . GERD (gastroesophageal reflux disease) 03/25/2011  . Chronic pain 03/25/2011  . 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 Xerostomia 03/25/2011  . Ulcer   . UTI (lower urinary tract infection)   . Allergy   . Chronic sinusitis 04/27/2011  . Impaired glucose tolerance 04/27/2011  . Cervical spondylolysis 04/30/2011    severe   Past Surgical History  Procedure Date  . Peg tube placement 11/1999  . Tracheostomy 11/1999  . Gastrostomy tube placement     reports that he quit smoking about 14 years ago. He has never used smokeless tobacco. He reports that he does not drink alcohol or use illicit drugs. family history includes Cancer in his other; Diabetes in his other; and Stroke in his other. Allergies  Allergen Reactions  . Codeine Nausea Only   Current Outpatient Prescriptions on File Prior to Visit  Medication Sig Dispense Refill  .  HYDROcodone-acetaminophen (LORCET) 10-650 MG per tablet Take 1 tablet by mouth every 8 (eight) hours as needed. For pain.      . ranitidine (ZANTAC) 150 MG tablet Take 150 mg by mouth 2 (two) times daily.        . sildenafil (VIAGRA) 100 MG tablet Take 0.5-1 tablets (50-100 mg total) by mouth daily as needed for erectile dysfunction.  3 tablet  11   Review of Systems Review of Systems  Constitutional: Negative for diaphoresis and unexpected weight change.  HENT: Negative for tinnitus.   Eyes: Negative for photophobia and visual disturbance.  Respiratory: Negative for choking and stridor.   Gastrointestinal: Negative for vomiting and blood in stool.  Genitourinary: Negative for hematuria and decreased urine volume.  Musculoskeletal: Negative for gait problem.  Skin: Negative for color change and wound.  Neurological: Negative for tremors and numbness.     Objective:   Physical Exam BP 120/70  Pulse 77  Temp(Src) 100.3 F (37.9 C) (Oral)  Wt 168 lb 4 oz (76.318 kg)  SpO2 95% Physical Exam  VS noted, mild ill Constitutional: Pt appears well-developed and well-nourished.  HENT: Head: Normocephalic.  Right Ear: External ear normal.  Left Ear: External ear normal.  Bilat tm's mild erythema.  Sinus tender left > right,  Pharynx not able to examine well Eyes: Conjunctivae and EOM are normal. Pupils are equal, round, and reactive to light.  Neck: Normal range of motion. Neck supple. Does have some left tender submandib LA Cardiovascular: Normal rate and regular rhythm.   Pulmonary/Chest: Effort normal and breath sounds normal.  Abd: soft, NT , +BS Neurological: Pt is alert. Not confused, but speech chronically difficult to understand Skin: Skin is warm. No erythema.  Psychiatric: Pt behavior is normal. 1+ nervous    Assessment & Plan:

## 2011-06-28 NOTE — Assessment & Plan Note (Signed)
stable overall by hx and exam, , and pt to continue medical treatment as before   

## 2011-06-28 NOTE — Assessment & Plan Note (Signed)
stable overall by hx and exam, most recent data reviewed with pt, and pt to continue medical treatment as before SpO2 Readings from Last 3 Encounters:  06/21/11 95%  04/27/11 98%  04/19/11 95%

## 2011-06-28 NOTE — Assessment & Plan Note (Signed)
stable overall by hx and exam, most recent data reviewed with pt, and pt to continue medical treatment as before Lab Results  Component Value Date   WBC 8.8 02/04/2011   HGB 11.7* 02/04/2011   HCT 35.2* 02/04/2011   PLT 402* 02/04/2011   GLUCOSE 113* 02/04/2011   ALT 30 02/03/2011   AST 26 02/03/2011   NA 134* 02/04/2011   K 4.3 02/04/2011   CL 97 02/04/2011   CREATININE 0.93 02/04/2011   BUN 14 02/04/2011   CO2 26 02/04/2011

## 2011-06-28 NOTE — Assessment & Plan Note (Signed)
Mild to mod, for antibx course,  to f/u any worsening symptoms or concerns 

## 2011-06-29 ENCOUNTER — Telehealth: Payer: Self-pay

## 2011-06-29 NOTE — Telephone Encounter (Signed)
Pt advised and states he did not have transportation does now and will try to come in fore lab draw either today or tomorrow.

## 2011-06-29 NOTE — Telephone Encounter (Signed)
Message copied by Pincus Sanes on Thu Jun 29, 2011 10:22 AM ------      Message from: Corwin Levins      Created: Wed Jun 28, 2011  9:08 PM      Regarding: promised to have labs done may 15       Please call pt;  I dont think the labs were done yesterday as he promised, please remind pt to have labs done if not drawn already

## 2011-06-29 NOTE — Telephone Encounter (Signed)
Called the patient left message to call back 

## 2011-07-03 ENCOUNTER — Telehealth: Payer: Self-pay

## 2011-07-03 NOTE — Telephone Encounter (Signed)
Patient called to inform MD he cannot do lab work as advised on 06/29/2011 due to finances.  Patient will do as requested by MD once able to financially.

## 2011-07-19 ENCOUNTER — Ambulatory Visit: Payer: Medicare Other | Admitting: Internal Medicine

## 2011-08-09 ENCOUNTER — Ambulatory Visit: Payer: Medicare Other | Admitting: Internal Medicine

## 2011-08-28 ENCOUNTER — Encounter (INDEPENDENT_AMBULATORY_CARE_PROVIDER_SITE_OTHER): Payer: Self-pay | Admitting: General Surgery

## 2011-08-28 ENCOUNTER — Ambulatory Visit (INDEPENDENT_AMBULATORY_CARE_PROVIDER_SITE_OTHER): Payer: Medicare Other | Admitting: General Surgery

## 2011-08-28 VITALS — BP 136/80 | HR 76 | Temp 98.7°F | Resp 20 | Ht 69.0 in | Wt 167.6 lb

## 2011-08-28 DIAGNOSIS — Z431 Encounter for attention to gastrostomy: Secondary | ICD-10-CM

## 2011-08-28 NOTE — Progress Notes (Signed)
Subjective:     Patient ID: Nicholas Cobb, male   DOB: 11-27-1950, 61 y.o.   MRN: [id number]  HPI  He is here because his G-tube is not working well and it needs to be replaced.   Review of Systems  He is doing fairly well over all and maintaining his weight.       Objective:   Physical Exam Gen-he looks well and is in NAD  Abd-soft, epigastric scar, LUQ gastrostomy tube.   24Fr MIC gastrostomy was changed without complication    Assessment:     Malfunctioning gastrostomy tube-changed to a new tube    Plan:      Return as needed for gastrostomy tube change.

## 2011-08-28 NOTE — Patient Instructions (Signed)
Call when you need your gastric tube replaced.

## 2011-10-23 ENCOUNTER — Telehealth: Payer: Self-pay | Admitting: Internal Medicine

## 2011-10-23 MED ORDER — HYDROCODONE-ACETAMINOPHEN 10-650 MG PO TABS: 1.0000 | ORAL_TABLET | Freq: Three times a day (TID) | ORAL | Status: DC | PRN

## 2011-10-23 NOTE — Telephone Encounter (Signed)
The pt called and is hoping to get a refill of hydrocodone.  Thanks!

## 2011-10-23 NOTE — Telephone Encounter (Signed)
Faxed hardcopy to pharmacy. 

## 2011-10-23 NOTE — Telephone Encounter (Signed)
Done hardcopy to robin  

## 2011-11-03 ENCOUNTER — Other Ambulatory Visit: Payer: Self-pay | Admitting: Internal Medicine

## 2011-11-03 MED ORDER — SILDENAFIL CITRATE 100 MG PO TABS: 50.0000 mg | ORAL_TABLET | Freq: Every day | ORAL | Status: DC | PRN

## 2011-11-03 MED ORDER — RANITIDINE HCL 150 MG PO TABS: 150.0000 mg | ORAL_TABLET | Freq: Two times a day (BID) | ORAL | Status: DC

## 2011-11-03 MED ORDER — HYDROCODONE-ACETAMINOPHEN 10-650 MG PO TABS: 1.0000 | ORAL_TABLET | Freq: Three times a day (TID) | ORAL | Status: DC | PRN

## 2011-11-03 NOTE — Telephone Encounter (Signed)
Ok , but remind pt next refill is not due until oct 9 based on last rx date  Done hardcopy to [place]

## 2011-11-03 NOTE — Telephone Encounter (Signed)
Faxed hardcopy to pharmacy. 

## 2011-11-03 NOTE — Telephone Encounter (Signed)
Pt req refill for all med,pt stated he is not going to have enough to last him until he comes in for an appt in 12/20/11. Pt stated he will be out in 2 week, please call pt.

## 2011-11-22 ENCOUNTER — Encounter (INDEPENDENT_AMBULATORY_CARE_PROVIDER_SITE_OTHER): Payer: Self-pay | Admitting: General Surgery

## 2011-11-22 ENCOUNTER — Ambulatory Visit (INDEPENDENT_AMBULATORY_CARE_PROVIDER_SITE_OTHER): Payer: Medicare Other | Admitting: General Surgery

## 2011-11-22 VITALS — BP 118/70 | HR 74 | Temp 98.6°F | Resp 18 | Ht 69.0 in | Wt 170.0 lb

## 2011-11-22 DIAGNOSIS — Z431 Encounter for attention to gastrostomy: Secondary | ICD-10-CM

## 2011-11-22 NOTE — Progress Notes (Signed)
Having some leakage around g-tube.  We did not have the proper size in stock.  Will order the proper size (24 Fr MIC) and call him back when it is in.

## 2011-11-22 NOTE — Patient Instructions (Signed)
We will call you when we get the proper tube size.

## 2011-12-08 ENCOUNTER — Emergency Department (HOSPITAL_COMMUNITY): Payer: Medicare Other

## 2011-12-08 ENCOUNTER — Emergency Department (HOSPITAL_COMMUNITY)
Admission: EM | Admit: 2011-12-08 | Discharge: 2011-12-08 | Disposition: A | Discharge status: Home / Self Care | Payer: Medicare Other | Attending: Emergency Medicine | Admitting: Emergency Medicine

## 2011-12-08 ENCOUNTER — Encounter (HOSPITAL_COMMUNITY): Payer: Self-pay | Admitting: Emergency Medicine

## 2011-12-08 DIAGNOSIS — G8929 Other chronic pain: Secondary | ICD-10-CM | POA: Insufficient documentation

## 2011-12-08 DIAGNOSIS — Z8739 Personal history of other diseases of the musculoskeletal system and connective tissue: Secondary | ICD-10-CM | POA: Insufficient documentation

## 2011-12-08 DIAGNOSIS — Z8709 Personal history of other diseases of the respiratory system: Secondary | ICD-10-CM | POA: Insufficient documentation

## 2011-12-08 DIAGNOSIS — K219 Gastro-esophageal reflux disease without esophagitis: Secondary | ICD-10-CM | POA: Insufficient documentation

## 2011-12-08 DIAGNOSIS — Z79899 Other long term (current) drug therapy: Secondary | ICD-10-CM | POA: Insufficient documentation

## 2011-12-08 DIAGNOSIS — Z87448 Personal history of other diseases of urinary system: Secondary | ICD-10-CM | POA: Insufficient documentation

## 2011-12-08 DIAGNOSIS — Z8521 Personal history of malignant neoplasm of larynx: Secondary | ICD-10-CM | POA: Insufficient documentation

## 2011-12-08 DIAGNOSIS — Z431 Encounter for attention to gastrostomy: Secondary | ICD-10-CM | POA: Insufficient documentation

## 2011-12-08 DIAGNOSIS — Z85819 Personal history of malignant neoplasm of unspecified site of lip, oral cavity, and pharynx: Secondary | ICD-10-CM | POA: Insufficient documentation

## 2011-12-08 DIAGNOSIS — J329 Chronic sinusitis, unspecified: Secondary | ICD-10-CM | POA: Insufficient documentation

## 2011-12-08 DIAGNOSIS — Z7982 Long term (current) use of aspirin: Secondary | ICD-10-CM | POA: Insufficient documentation

## 2011-12-08 DIAGNOSIS — Z87891 Personal history of nicotine dependence: Secondary | ICD-10-CM | POA: Insufficient documentation

## 2011-12-08 DIAGNOSIS — Z87440 Personal history of urinary (tract) infections: Secondary | ICD-10-CM | POA: Insufficient documentation

## 2011-12-08 DIAGNOSIS — Z8719 Personal history of other diseases of the digestive system: Secondary | ICD-10-CM | POA: Insufficient documentation

## 2011-12-08 DIAGNOSIS — Z87898 Personal history of other specified conditions: Secondary | ICD-10-CM | POA: Insufficient documentation

## 2011-12-08 MED ORDER — KETOROLAC TROMETHAMINE 30 MG/ML IJ SOLN
INTRAMUSCULAR | Status: AC
  Administered 2011-12-08: 60 mg
  Filled 2011-12-08: qty 1

## 2011-12-08 MED ORDER — KETOROLAC TROMETHAMINE 60 MG/2ML IM SOLN: 60.0000 mg | Freq: Once | INTRAMUSCULAR | Status: DC

## 2011-12-08 MED ORDER — HYDROMORPHONE HCL PF 1 MG/ML IJ SOLN
0.5000 mg | Freq: Once | INTRAMUSCULAR | Status: AC
  Administered 2011-12-08: 0.5 mg via INTRAMUSCULAR
  Filled 2011-12-08: qty 1

## 2011-12-08 MED ORDER — KETOROLAC TROMETHAMINE 30 MG/ML IJ SOLN
INTRAMUSCULAR | Status: AC
  Filled 2011-12-08: qty 1

## 2011-12-08 MED ORDER — OXYMETAZOLINE HCL 0.05 % NA SOLN
1.0000 | Freq: Once | NASAL | Status: AC
  Administered 2011-12-08: 1 via NASAL
  Filled 2011-12-08: qty 15

## 2011-12-08 NOTE — ED Provider Notes (Signed)
History  Scribed for [employer]. Nicholas Mayers, MD, the patient was seen in room TR04C/TR04C. This chart was scribed by Candelaria Stagers. The patient's care started at 4:10 PM   CSN: [id number]  Arrival date & time 12/08/11  1528   First MD Initiated Contact with Patient 12/08/11 1607      Chief Complaint  Patient presents with  . PEG tube came out      The history is provided by the patient. No language interpreter was used.   Nicholas Cobb is a 61 y.o. male who presents to the Emergency Department complaining that his PEG tube came out earlier today.  Pt also has a trach in place.  He has no other sx at this time.    Past Medical History  Diagnosis Date  . Hearing loss   . Change in voice   . Rash   . Trouble swallowing   . Difficulty urinating   . Cervical spondylosis 03/25/2011  . Emphysema 03/25/2011  . GERD (gastroesophageal reflux disease) 03/25/2011  . Chronic pain 03/25/2011  . 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 Xerostomia 03/25/2011  . Ulcer   . UTI (lower urinary tract infection)   . Allergy   . Chronic sinusitis 04/27/2011  . Impaired glucose tolerance 04/27/2011  . Cervical spondylolysis 04/30/2011    severe  . Cancer 1999    throat cancer    Past Surgical History  Procedure Date  . Peg tube placement 11/1999  . Tracheostomy 11/1999  . Gastrostomy tube placement     Family History  Problem Relation Age of Onset  . Stroke Other   . Diabetes Other   . Cancer Other     lung cancer    History  Substance Use Topics  . Smoking status: Former Smoker    Quit date: 11/06/1996  . Smokeless tobacco: Never Used  . Alcohol Use: No      Review of Systems  Gastrointestinal:       PEG tube has come out  All other systems reviewed and are negative.    Allergies  Codeine  Home Medications   Current Outpatient Rx  Name Route Sig Dispense Refill  . AMOXICILLIN 500 MG PO CAPS  2 tabs by mouth twice per day 40 capsule 0  . HYDROCODONE-ACETAMINOPHEN 10-650  MG PO TABS Oral Take 1 tablet by mouth every 8 (eight) hours as needed. For pain. - to fill Nov 22, 2011 90 tablet 1  . RANITIDINE HCL 150 MG PO TABS Oral Take 1 tablet (150 mg total) by mouth 2 (two) times daily. 60 tablet 1  . SILDENAFIL CITRATE 100 MG PO TABS Oral Take 0.5-1 tablets (50-100 mg total) by mouth daily as needed. 10 tablet 0    BP 181/112  Pulse 113  Temp 98.9 F (37.2 C) (Oral)  Resp 28  SpO2 97%  Physical Exam  Nursing note and vitals reviewed. Constitutional: He is oriented to person, place, and time. He appears well-developed and well-nourished.  HENT:  Head: Normocephalic and atraumatic.  Eyes: EOM are normal. Pupils are equal, round, and reactive to light.  Neck: Normal range of motion. Neck supple.  Cardiovascular: Normal rate, normal heart sounds and intact distal pulses.   Pulmonary/Chest: Effort normal and breath sounds normal.  Abdominal: Bowel sounds are normal. He exhibits no distension. There is no tenderness.       Drainage from PEG stoma appears to be gastric contents.  Musculoskeletal: Normal range of motion. He exhibits no edema  and no tenderness.  Neurological: He is alert and oriented to person, place, and time. He has normal strength. No cranial nerve deficit or sensory deficit.  Skin: Skin is warm and dry. No rash noted.  Psychiatric: He has a normal mood and affect.    ED Course  Procedures   DIAGNOSTIC STUDIES:     COORDINATION OF CARE:   5:00PM PEG tube inserted by EDMD.  Procedure tolerated by pt.  Will send for images to insure tube is in proper place.    5:04PM Ordered: DG Abd 1 View  5:45 PM Pt expresses concern over new PEG tube, states port is not large enough for the syringe he uses at home for feeds.     6:38 PM New PEG tube placed by EDMD.  Used a modified 18Fr foley catheter which has a larger port. Discussed with Dr. Luisa Hart who states the office can order a more suitable replacement tube for the patient next week. Will  send for confirmatory xray.    Labs Reviewed - No data to display No results found.   No diagnosis found.    MDM   I personally performed the services described in the documentation, which were scribed in my presence. The recorded information has been reviewed and considered.   Care signed out at the change of shift pending confirmatory xray. His tube is still leaking clear fluid but this is the best option we have to offer him at this point.      Charles B. Nicholas Mayers, MD 12/08/11 1478

## 2011-12-08 NOTE — ED Provider Notes (Signed)
Medical screening examination/treatment/procedure(s) were conducted as a shared visit with non-physician practitioner(s) and myself.  I personally evaluated the patient during the encounter   [name], MD 12/08/11 2147

## 2011-12-08 NOTE — ED Notes (Signed)
Pt's PEG tube site cleaned and dressed prior to dc, pt given numerous 4X4 for use at home

## 2011-12-08 NOTE — ED Provider Notes (Signed)
Patient originally seen by Dr. [name]. Hand off to me to await imaging results to confirm that Peg tube is intraluminal.  Results for orders placed during the hospital encounter of 02/03/11  CBC      Component Value Range   WBC 12.7 (*) 4.0 - 10.5 K/uL   RBC 4.41  4.22 - 5.81 MIL/uL   Hemoglobin 13.5  13.0 - 17.0 g/dL   HCT 40.9  81.1 - 91.4 %   MCV 89.3  78.0 - 100.0 fL   MCH 30.6  26.0 - 34.0 pg   MCHC 34.3  30.0 - 36.0 g/dL   RDW 78.2  95.6 - 21.3 %   Platelets 400  150 - 400 K/uL  DIFFERENTIAL      Component Value Range   Neutrophils Relative 84 (*) 43 - 77 %   Neutro Abs 10.6 (*) 1.7 - 7.7 K/uL   Lymphocytes Relative 6 (*) 12 - 46 %   Lymphs Abs 0.8  0.7 - 4.0 K/uL   Monocytes Relative 10  3 - 12 %   Monocytes Absolute 1.3 (*) 0.1 - 1.0 K/uL   Eosinophils Relative 0  0 - 5 %   Eosinophils Absolute 0.0  0.0 - 0.7 K/uL   Basophils Relative 0  0 - 1 %   Basophils Absolute 0.0  0.0 - 0.1 K/uL  COMPREHENSIVE METABOLIC PANEL      Component Value Range   Sodium 131 (*) 135 - 145 mEq/L   Potassium 4.1  3.5 - 5.1 mEq/L   Chloride 91 (*) 96 - 112 mEq/L   CO2 28  19 - 32 mEq/L   Glucose, Bld 112 (*) 70 - 99 mg/dL   BUN 13  6 - 23 mg/dL   Creatinine, Ser 0.86  0.50 - 1.35 mg/dL   Calcium 57.8  8.4 - 46.9 mg/dL   Total Protein 9.3 (*) 6.0 - 8.3 g/dL   Albumin 3.1 (*) 3.5 - 5.2 g/dL   AST 26  0 - 37 U/L   ALT 30  0 - 53 U/L   Alkaline Phosphatase 183 (*) 39 - 117 U/L   Total Bilirubin 0.3  0.3 - 1.2 mg/dL   GFR calc non Af Amer 87 (*) >90 mL/min   GFR calc Af Amer >90  >90 mL/min  CULTURE, BLOOD (ROUTINE X 2)      Component Value Range   Specimen Description BLOOD LEFT ANTECUBITAL     Special Requests BOTTLES DRAWN AEROBIC AND ANAEROBIC 10CC     Culture  Setup Time [id number]     Culture NO GROWTH 5 DAYS     Report Status 02/09/2011 FINAL    CULTURE, BLOOD (ROUTINE X 2)      Component Value Range   Specimen Description BLOOD LEFT HAND     Special Requests BOTTLES DRAWN  AEROBIC ONLY 5CC     Culture  Setup Time [id number]     Culture NO GROWTH 5 DAYS     Report Status 02/09/2011 FINAL    CULTURE, RESPIRATORY      Component Value Range   Specimen Description ENDOTRACHEAL ASPIRATE     Special Requests NONE     Gram Stain       Value: MODERATE WBC PRESENT, PREDOMINANTLY PMN     FEW SQUAMOUS EPITHELIAL CELLS PRESENT     FEW GRAM NEGATIVE RODS     FEW GRAM POSITIVE RODS     FEW GRAM POSITIVE COCCI IN PAIRS   Culture  Value: MODERATE METHICILLIN RESISTANT STAPHYLOCOCCUS AUREUS     Note: RIFAMPIN AND GENTAMICIN SHOULD NOT BE USED AS SINGLE DRUGS FOR TREATMENT OF STAPH INFECTIONS. This organism is presumed to be Clindamycin resistant based on detection of inducible Clindamycin resistance. CRITICAL RESULT CALLED TO, READ BACK BY AND      VERIFIED WITH: ALRED RN 1130AM 02/06/11 GUS   Report Status 02/06/2011 FINAL     Organism ID, Bacteria METHICILLIN RESISTANT STAPHYLOCOCCUS AUREUS    LACTIC ACID, PLASMA      Component Value Range   Lactic Acid, Venous 1.8  0.5 - 2.2 mmol/L  AMMONIA      Component Value Range   Ammonia 28  11 - 60 umol/L  URINALYSIS, ROUTINE W REFLEX MICROSCOPIC      Component Value Range   Color, Urine YELLOW  YELLOW   APPearance CLEAR  CLEAR   Specific Gravity, Urine 1.024  1.005 - 1.030   pH 6.0  5.0 - 8.0   Glucose, UA NEGATIVE  NEGATIVE mg/dL   Hgb urine dipstick NEGATIVE  NEGATIVE   Bilirubin Urine NEGATIVE  NEGATIVE   Ketones, ur NEGATIVE  NEGATIVE mg/dL   Protein, ur NEGATIVE  NEGATIVE mg/dL   Urobilinogen, UA 0.2  0.0 - 1.0 mg/dL   Nitrite NEGATIVE  NEGATIVE   Leukocytes, UA NEGATIVE  NEGATIVE  GRAM STAIN      Component Value Range   Specimen Description URINE, RANDOM     Special Requests NONE     Gram Stain       Value: CYTOSPIN PREP SLIDE     NO WBC SEEN     NEGATIVE FOR BACTERIA   Report Status 02/04/2011 FINAL    POCT I-STAT TROPONIN I      Component Value Range   Troponin i, poc 0.08  0.00 - 0.08 ng/mL     Comment 3           BASIC METABOLIC PANEL      Component Value Range   Sodium 134 (*) 135 - 145 mEq/L   Potassium 4.3  3.5 - 5.1 mEq/L   Chloride 97  96 - 112 mEq/L   CO2 26  19 - 32 mEq/L   Glucose, Bld 113 (*) 70 - 99 mg/dL   BUN 14  6 - 23 mg/dL   Creatinine, Ser 1.61  0.50 - 1.35 mg/dL   Calcium 9.8  8.4 - 09.6 mg/dL   GFR calc non Af Amer 88 (*) >90 mL/min   GFR calc Af Amer >90  >90 mL/min  CBC      Component Value Range   WBC 8.8  4.0 - 10.5 K/uL   RBC 3.92 (*) 4.22 - 5.81 MIL/uL   Hemoglobin 11.7 (*) 13.0 - 17.0 g/dL   HCT 04.5 (*) 40.9 - 81.1 %   MCV 89.8  78.0 - 100.0 fL   MCH 29.8  26.0 - 34.0 pg   MCHC 33.2  30.0 - 36.0 g/dL   RDW 91.4  78.2 - 95.6 %   Platelets 402 (*) 150 - 400 K/uL   Dg Abd 1 View  12/08/2011  *RADIOLOGY REPORT*  Clinical Data: Confirm NG tube placement  ABDOMEN - 1 VIEW  Comparison: None.  Findings: Contrast opacifies the gastric cardia and confirms intraluminal gastrostomy placement.  No extraluminal contrast is seen.  Nonobstructive bowel gas pattern.  IMPRESSION: Contrast within the gastric cardia confirms intraluminal gastrostomy placement.   Original Report Authenticated By: Charline Bills, M.D.  Abdomen confirms that gastrostomy is in place.  Pt has been advised of the symptoms that warrant their return to the ED. Patient has voiced understanding and has agreed to follow-up with the PCP or specialist.   Dorthula Matas, PA 12/08/11 2039

## 2011-12-08 NOTE — ED Notes (Signed)
Pt reports that his PEG tube came out today, having drainage at site; pt reports has had PEG tube for long time; pt also has trach---which states he has had that a long time as well

## 2011-12-08 NOTE — ED Notes (Signed)
MD at bedside. 

## 2011-12-08 NOTE — ED Notes (Signed)
Replacement PEG tube obtained from Endo

## 2011-12-12 ENCOUNTER — Telehealth (INDEPENDENT_AMBULATORY_CARE_PROVIDER_SITE_OTHER): Payer: Self-pay | Admitting: General Surgery

## 2011-12-12 NOTE — Telephone Encounter (Signed)
Patient calling to see if we have larger G tube yet (24) because he is still having a lot of leaking. Please advise.

## 2011-12-20 ENCOUNTER — Encounter: Payer: Self-pay | Admitting: Internal Medicine

## 2011-12-20 ENCOUNTER — Ambulatory Visit (INDEPENDENT_AMBULATORY_CARE_PROVIDER_SITE_OTHER): Payer: Medicare Other | Admitting: Internal Medicine

## 2011-12-20 ENCOUNTER — Ambulatory Visit (INDEPENDENT_AMBULATORY_CARE_PROVIDER_SITE_OTHER): Payer: Medicare Other | Admitting: General Surgery

## 2011-12-20 ENCOUNTER — Ambulatory Visit (INDEPENDENT_AMBULATORY_CARE_PROVIDER_SITE_OTHER)
Admission: RE | Admit: 2011-12-20 | Discharge: 2011-12-20 | Disposition: A | Discharge status: Home / Self Care | Payer: Medicare Other | Source: Ambulatory Visit | Attending: Internal Medicine | Admitting: Internal Medicine

## 2011-12-20 VITALS — BP 120/80 | HR 85 | Temp 98.5°F | Ht 69.0 in | Wt 166.4 lb

## 2011-12-20 DIAGNOSIS — Z431 Encounter for attention to gastrostomy: Secondary | ICD-10-CM

## 2011-12-20 DIAGNOSIS — Z0000 Encounter for general adult medical examination without abnormal findings: Secondary | ICD-10-CM

## 2011-12-20 DIAGNOSIS — N529 Male erectile dysfunction, unspecified: Secondary | ICD-10-CM

## 2011-12-20 DIAGNOSIS — J209 Acute bronchitis, unspecified: Secondary | ICD-10-CM

## 2011-12-20 DIAGNOSIS — R7309 Other abnormal glucose: Secondary | ICD-10-CM

## 2011-12-20 DIAGNOSIS — G8929 Other chronic pain: Secondary | ICD-10-CM

## 2011-12-20 DIAGNOSIS — R7302 Impaired glucose tolerance (oral): Secondary | ICD-10-CM

## 2011-12-20 MED ORDER — ASPIRIN 81 MG PO TBEC: 81.0000 mg | DELAYED_RELEASE_TABLET | Freq: Every day | ORAL | Status: DC

## 2011-12-20 MED ORDER — HYDROCODONE-HOMATROPINE 5-1.5 MG/5ML PO SYRP: 5.0000 mL | ORAL_SOLUTION | Freq: Four times a day (QID) | ORAL | Status: DC | PRN

## 2011-12-20 MED ORDER — HYDROCODONE-ACETAMINOPHEN 10-650 MG PO TABS: 1.0000 | ORAL_TABLET | Freq: Three times a day (TID) | ORAL | Status: DC | PRN

## 2011-12-20 MED ORDER — RANITIDINE HCL 150 MG PO TABS: 150.0000 mg | ORAL_TABLET | Freq: Two times a day (BID) | ORAL | Status: DC

## 2011-12-20 MED ORDER — SILDENAFIL CITRATE 100 MG PO TABS: 50.0000 mg | ORAL_TABLET | Freq: Every day | ORAL | Status: DC | PRN

## 2011-12-20 MED ORDER — LEVOFLOXACIN 250 MG PO TABS: 250.0000 mg | ORAL_TABLET | Freq: Every day | ORAL | Status: DC

## 2011-12-20 MED ORDER — IBUPROFEN 400 MG PO TABS: 400.0000 mg | ORAL_TABLET | Freq: Three times a day (TID) | ORAL | Status: DC | PRN

## 2011-12-20 NOTE — Assessment & Plan Note (Signed)
Asympt, recent a1c 6.5 in sept 2013,  to f/u any worsening symptoms or concerns

## 2011-12-20 NOTE — Assessment & Plan Note (Signed)
Ok for viagra refill 

## 2011-12-20 NOTE — Assessment & Plan Note (Signed)
Asked pt to f/u again with Dr Maris Berger office/gen surgury

## 2011-12-20 NOTE — Patient Instructions (Addendum)
Please start Aspirin at 81 mg per day - Enteric Coated only, to help reduce your risk of stroke and heart attack in the future Please return if you change your mind about having the flu shot Take all new medications as prescribed - the antibiotic, and the cough medicine Please go to XRAY in the Basement for the x-ray test You will be contacted by phone if any changes need to be made immediately.  Otherwise, you will receive a letter about your results with an explanation. Please remember to sign up for My Chart at your earliest convenience, as this will be important to you in the future with finding out test results. Please call Dr Maris Berger office again to work on the PEG tube  All of your medications were refilled today Please return in 6 months with Lab testing done 3-5 days before

## 2011-12-20 NOTE — Progress Notes (Signed)
Subjective:    Patient ID: Nicholas Cobb, male    DOB: 05/27/1950, 61 y.o.   MRN: [id number]  HPI  Here with acute onset mild to mod 2-3 days ST, HA, general weakness and malaise, with prod cough greenish sputum, but Pt denies chest pain, increased sob or doe, wheezing, orthopnea, PND, increased LE swelling, palpitations, dizziness or syncope.  Pt denies new neurological symptoms such as new headache, or facial or extremity weakness or numbness   Pt denies polydipsia, polyuria.  Son is trying to get him to move [place] to be cared for at [facility], and he is considering, finds it hard to get care to his satisfaciton in [place].  Needs mult med refill today.   Pt denies fever, wt loss, night sweats, loss of appetite, or other constitutional symptoms except for the above. Chronic pain ok on current tx.  Needs to see Dr Abbey Chatters about the PEG per pt, has called twice in the past month without a response per pt.  Has ongoing ED symptoms, needs viagra refill. Past Medical History  Diagnosis Date  . Hearing loss   . Change in voice   . Rash   . Trouble swallowing   . Difficulty urinating   . Cervical spondylosis 03/25/2011  . Emphysema 03/25/2011  . GERD (gastroesophageal reflux disease) 03/25/2011  . Chronic pain 03/25/2011  . 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 Xerostomia 03/25/2011  . Ulcer   . UTI (lower urinary tract infection)   . Allergy   . Chronic sinusitis 04/27/2011  . Impaired glucose tolerance 04/27/2011  . Cervical spondylolysis 04/30/2011    severe  . Cancer 1999    throat cancer   Past Surgical History  Procedure Date  . Peg tube placement 11/1999  . Tracheostomy 11/1999  . Gastrostomy tube placement     reports that he quit smoking about 15 years ago. He has never used smokeless tobacco. He reports that he does not drink alcohol or use illicit drugs. family history includes Cancer in his other; Diabetes in his other; and Stroke in his other. Allergies  Allergen Reactions    . Codeine Nausea And Vomiting   Current Outpatient Prescriptions on File Prior to Visit  Medication Sig Dispense Refill  . GuaiFENesin (MUCINEX PO) Take 1 tablet by mouth 2 (two) times daily as needed. For chest congestion      . [DISCONTINUED] ranitidine (ZANTAC) 150 MG tablet Take 150 mg by mouth 2 (two) times daily.          Review of Systems  Constitutional: Negative for diaphoresis and unexpected weight change.  HENT: Negative for tinnitus.   Eyes: Negative for photophobia and visual disturbance.  Respiratory: Negative for choking and stridor.   Gastrointestinal: Negative for vomiting and blood in stool.  Genitourinary: Negative for hematuria and decreased urine volume.  Musculoskeletal: Negative for gait problem.  Skin: Negative for color change and wound.  Neurological: Negative for tremors and numbness.  Psychiatric/Behavioral: Negative for decreased concentration. The patient is not hyperactive.       Objective:   Physical Exam BP 120/80  Pulse 85  Temp 98.5 F (36.9 C) (Oral)  Ht 5\' 9"  (1.753 m)  Wt 166 lb 6 oz (75.467 kg)  BMI 24.57 kg/m2  SpO2 97% Physical Exam  VS noted, mild ill Constitutional: Pt appears well-developed and well-nourished.  HENT: Head: Normocephalic.  Right Ear: External ear normal.  Left Ear: External ear normal.  Bilat tm's mild erythema.  Sinus nontender.  Pharynx mild erythema though difficult exam Eyes: Conjunctivae and EOM are normal. Pupils are equal, round, and reactive to light.  Neck: Normal range of motion. Neck supple.  Cardiovascular: Normal rate and regular rhythm.   Pulmonary/Chest: Effort normal and breath sounds normal.  Abd:  Soft, NT, non-distended, + BS Neurological: Pt is alert. Not confused  Skin: Skin is warm. No erythema.  Psychiatric: Pt behavior is normal. Thought content normal.     Assessment & Plan:

## 2011-12-20 NOTE — Assessment & Plan Note (Addendum)
Mild to mod, for antibx course,  to f/u any worsening symptoms or concerns, also cant r/o pna - for cxr 

## 2011-12-20 NOTE — Assessment & Plan Note (Signed)
stable overall by hx and exam, most recent data reviewed with pt, and pt to continue medical treatment as before, med refilled today

## 2011-12-21 ENCOUNTER — Encounter (INDEPENDENT_AMBULATORY_CARE_PROVIDER_SITE_OTHER): Payer: Self-pay | Admitting: General Surgery

## 2011-12-21 ENCOUNTER — Ambulatory Visit (INDEPENDENT_AMBULATORY_CARE_PROVIDER_SITE_OTHER): Payer: Medicare Other | Admitting: General Surgery

## 2011-12-21 VITALS — BP 134/72 | HR 70 | Temp 98.1°F | Resp 18 | Ht 69.0 in | Wt 166.1 lb

## 2011-12-21 DIAGNOSIS — Z431 Encounter for attention to gastrostomy: Secondary | ICD-10-CM

## 2011-12-21 NOTE — Progress Notes (Signed)
Subjective:     Patient ID: Nicholas Cobb, male   DOB: 1950-05-28, 61 y.o.   MRN: [id number]  HPI   Nicholas Cobb is here for replacement of his gastrostomy tube. In the interim, he had a temporary tube placed in the emergency department.   Review of Systems     Objective:   Physical Exam Gen.-he looks well and is in no acute distress.  Abdomen-gastrostomy tube is in left upper quadrant and was removed. A 24 French MIC gastrostomy tube was then placed and he tolerated this well.    Assessment:     Malfunctioning gastrostomy tube-replaced.    Plan:     Return as needed for tube replacement.

## 2011-12-21 NOTE — Patient Instructions (Signed)
Call when you need the G-tube replaced again.

## 2012-02-29 ENCOUNTER — Telehealth: Payer: Self-pay

## 2012-02-29 MED ORDER — HYDROCODONE-ACETAMINOPHEN 10-325 MG PO TABS: 1.0000 | ORAL_TABLET | Freq: Three times a day (TID) | ORAL | Status: DC | PRN

## 2012-02-29 NOTE — Telephone Encounter (Signed)
Done hardcopy to robin  

## 2012-02-29 NOTE — Telephone Encounter (Signed)
Pharmacy request to Change hydrocodone 10-650 due to FDA, advise to change to 325 mg

## 2012-03-01 NOTE — Telephone Encounter (Signed)
Rx faxed to [facility].

## 2012-03-07 ENCOUNTER — Encounter: Payer: Self-pay | Admitting: Internal Medicine

## 2012-03-07 ENCOUNTER — Ambulatory Visit (INDEPENDENT_AMBULATORY_CARE_PROVIDER_SITE_OTHER)
Admission: RE | Admit: 2012-03-07 | Discharge: 2012-03-07 | Disposition: A | Discharge status: Home / Self Care | Payer: Medicare Other | Source: Ambulatory Visit | Attending: Internal Medicine | Admitting: Internal Medicine

## 2012-03-07 ENCOUNTER — Ambulatory Visit (INDEPENDENT_AMBULATORY_CARE_PROVIDER_SITE_OTHER): Payer: Medicare Other | Admitting: Internal Medicine

## 2012-03-07 VITALS — BP 130/80 | HR 93 | Temp 99.3°F | Wt 165.0 lb

## 2012-03-07 DIAGNOSIS — J438 Other emphysema: Secondary | ICD-10-CM

## 2012-03-07 DIAGNOSIS — R7309 Other abnormal glucose: Secondary | ICD-10-CM

## 2012-03-07 DIAGNOSIS — J209 Acute bronchitis, unspecified: Secondary | ICD-10-CM

## 2012-03-07 DIAGNOSIS — J439 Emphysema, unspecified: Secondary | ICD-10-CM

## 2012-03-07 DIAGNOSIS — R7302 Impaired glucose tolerance (oral): Secondary | ICD-10-CM

## 2012-03-07 MED ORDER — LEVOFLOXACIN 250 MG PO TABS: 250.0000 mg | ORAL_TABLET | Freq: Every day | ORAL | Status: DC

## 2012-03-07 MED ORDER — HYDROCODONE-HOMATROPINE 5-1.5 MG/5ML PO SYRP: 5.0000 mL | ORAL_SOLUTION | Freq: Four times a day (QID) | ORAL | Status: DC | PRN

## 2012-03-07 NOTE — Patient Instructions (Addendum)
Please take all new medication as prescribed  - the antibiotic, and cough medicine Please continue all other medications as before, and refills have been done if requested. Please go to the XRAY Department in the Basement (go straight as you get off the elevator) for the x-ray testing You will be contacted by phone if any changes need to be made immediately.  Otherwise, you will receive a letter about your results with an explanation Please remember to sign up for My Chart if you have not done so, as this will be important to you in the future with finding out test results, communicating by private email, and scheduling acute appointments online when needed.\

## 2012-03-09 ENCOUNTER — Encounter: Payer: Self-pay | Admitting: Internal Medicine

## 2012-03-09 NOTE — Assessment & Plan Note (Signed)
Mild to mod, for antibx course,  to f/u any worsening symptoms or concerns 

## 2012-03-09 NOTE — Progress Notes (Signed)
Subjective:    Patient ID: Nicholas Cobb, male    DOB: [birth year], 62 y.o.   MRN: [id number]  HPI  Here with acute onset mild to mod 2-3 days ST, HA, general weakness and malaise, with prod cough greenish sputum, but Pt denies chest pain, increased sob or doe, wheezing, orthopnea, PND, increased LE swelling, palpitations, dizziness or syncope.   Pt denies polydipsia, polyuria, Pt states overall good compliance with meds, trying to follow lower cholesterol, diabetic diet, wt overall stable.   Pt denies wt loss, night sweats, loss of appetite, or other constitutional symptoms.  Mentions multiple times I need to look at his record to "know all about him," seems offended that I ask him history questions Past Medical History  Diagnosis Date  . Hearing loss   . Change in voice   . Rash   . Trouble swallowing   . Difficulty urinating   . Cervical spondylosis 03/25/2011  . Emphysema 03/25/2011  . GERD (gastroesophageal reflux disease) 03/25/2011  . Chronic pain 03/25/2011  . 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 Xerostomia 03/25/2011  . Ulcer   . UTI (lower urinary tract infection)   . Allergy   . Chronic sinusitis 04/27/2011  . Impaired glucose tolerance 04/27/2011  . Cervical spondylolysis 04/30/2011    severe  . Cancer 1999    throat cancer   Past Surgical History  Procedure Date  . Peg tube placement 11/1999  . Tracheostomy 11/1999  . Gastrostomy tube placement     reports that he quit smoking about 15 years ago. He has never used smokeless tobacco. He reports that he does not drink alcohol or use illicit drugs. family history includes Cancer in his other; Diabetes in his other; and Stroke in his other. Allergies  Allergen Reactions  . Codeine Nausea And Vomiting   Current Outpatient Prescriptions on File Prior to Visit  Medication Sig Dispense Refill  . aspirin 81 MG EC tablet Take 1 tablet (81 mg total) by mouth daily. Swallow whole.  30 tablet  12  . GuaiFENesin (MUCINEX PO) Take 1  tablet by mouth 2 (two) times daily as needed. For chest congestion      . HYDROcodone-acetaminophen (NORCO) 10-325 MG per tablet Take 1 tablet by mouth 3 (three) times daily as needed for pain.  90 tablet  2  . ibuprofen (ADVIL,MOTRIN) 400 MG tablet Take 1 tablet (400 mg total) by mouth 3 (three) times daily as needed.  90 tablet  2  . ranitidine (ZANTAC) 150 MG tablet Take 1 tablet (150 mg total) by mouth 2 (two) times daily.  180 tablet  3  . sildenafil (VIAGRA) 100 MG tablet Take 0.5-1 tablets (50-100 mg total) by mouth daily as needed for erectile dysfunction.  5 tablet  11   Review of Systems  Constitutional: Negative for unexpected weight change, or unusual diaphoresis  HENT: Negative for tinnitus.   Eyes: Negative for photophobia and visual disturbance.  Respiratory: Negative for choking and stridor.   Gastrointestinal: Negative for vomiting and blood in stool.  Genitourinary: Negative for hematuria and decreased urine volume.  Musculoskeletal: Negative for acute joint swelling Skin: Negative for color change and wound.  Neurological: Negative for tremors and numbness other than noted  Psychiatric/Behavioral: Negative for decreased concentration or  hyperactivity.       Objective:   Physical Exam BP 130/80  Pulse 93  Temp 99.3 F (37.4 C) (Oral)  Wt 165 lb (74.844 kg)  SpO2 93% VS noted,  Constitutional:  Pt appears well-developed and well-nourished.  HENT: Head: NCAT. S/p radical right jaw surgury as before Right Ear: External ear normal.  Left Ear: External ear normal.  Bilat tm's with mild erythema.  Max sinus areas nontender.  Pharynx unable to be examined due to unable to open mouth completely Eyes: Conjunctivae and EOM are normal. Pupils are equal, round, and reactive to light.  Neck: no mass or unsual LA Cardiovascular: Normal rate and regular rhythm.   Pulmonary/Chest: Effort normal and breath sounds normal.  - no rales or wheezing Neurological: Pt is alert. Not  confused  Skin: Skin is warm. No erythema.  Psychiatric: Pt behavior is normal. Thought content normal    Assessment & Plan:

## 2012-03-09 NOTE — Assessment & Plan Note (Signed)
stable overall by history and exam, recent data reviewed with pt, and pt to continue medical treatment as before,  to f/u any worsening symptoms or concerns SpO2 Readings from Last 3 Encounters:  03/07/12 93%  12/20/11 97%  12/08/11 95%

## 2012-03-09 NOTE — Assessment & Plan Note (Addendum)
stable overall by history and exam, recent data reviewed with pt, and pt to continue medical treatment as before,  to f/u any worsening symptoms or concerns Lab Results  Component Value Date   WBC 8.8 02/04/2011   HGB 11.7* 02/04/2011   HCT 35.2* 02/04/2011   PLT 402* 02/04/2011   GLUCOSE 113* 02/04/2011   ALT 30 02/03/2011   AST 26 02/03/2011   NA 134* 02/04/2011   K 4.3 02/04/2011   CL 97 02/04/2011   CREATININE 0.93 02/04/2011   BUN 14 02/04/2011   CO2 26 02/04/2011  declines further lab today

## 2012-04-25 ENCOUNTER — Telehealth (INDEPENDENT_AMBULATORY_CARE_PROVIDER_SITE_OTHER): Payer: Self-pay

## 2012-04-25 NOTE — Telephone Encounter (Signed)
Pt calling b/c he is having trouble with his g-tube getting clogged and causing pain. The pt states that he has had a g-tube long enough in his life that he knows when it's time to get it replaced. The pt wanted to speak to Dr Abbey Chatters but I advised pt that Dr Abbey Chatters would be out of the office for 10 days. I told the pt that I would notify his nurse. I notified Cyndra Numbers of the pt's message. Cyndra Numbers will call the pt back. The pt understands.

## 2012-04-25 NOTE — Telephone Encounter (Signed)
Notified the pt that I put him on our urgent office schedule to see Dr. Derrell Lolling on 04/26/12.  He says that he would prefer to try to wait until Dr. Abbey Chatters returns to clinic.  He has a previously scheduled appt of 05/09/12.  I advised him to call me if the g-tube becomes completely clogged, but that he would need to see another physician in the practice, or be sent to IR at the hospital pending physician decision.

## 2012-04-26 ENCOUNTER — Encounter (INDEPENDENT_AMBULATORY_CARE_PROVIDER_SITE_OTHER): Payer: Medicare Other | Admitting: General Surgery

## 2012-04-30 ENCOUNTER — Ambulatory Visit (INDEPENDENT_AMBULATORY_CARE_PROVIDER_SITE_OTHER): Payer: Medicare Other | Admitting: Internal Medicine

## 2012-04-30 ENCOUNTER — Encounter: Payer: Self-pay | Admitting: Internal Medicine

## 2012-04-30 VITALS — BP 132/82 | HR 85 | Temp 97.1°F | Wt 166.5 lb

## 2012-04-30 DIAGNOSIS — G8929 Other chronic pain: Secondary | ICD-10-CM

## 2012-04-30 DIAGNOSIS — J209 Acute bronchitis, unspecified: Secondary | ICD-10-CM

## 2012-04-30 DIAGNOSIS — J438 Other emphysema: Secondary | ICD-10-CM

## 2012-04-30 DIAGNOSIS — J439 Emphysema, unspecified: Secondary | ICD-10-CM

## 2012-04-30 DIAGNOSIS — C329 Malignant neoplasm of larynx, unspecified: Secondary | ICD-10-CM

## 2012-04-30 MED ORDER — LEVOFLOXACIN 250 MG PO TABS: 250.0000 mg | ORAL_TABLET | Freq: Every day | ORAL | Status: DC

## 2012-04-30 MED ORDER — HYDROCODONE-HOMATROPINE 5-1.5 MG/5ML PO SYRP: 5.0000 mL | ORAL_SOLUTION | Freq: Four times a day (QID) | ORAL | Status: DC | PRN

## 2012-04-30 NOTE — Patient Instructions (Signed)
Please take all new medication as prescribed Please continue all other medications as before You will be contacted regarding the referral for: Dr Sherrie Mustache in [facility]

## 2012-04-30 NOTE — Assessment & Plan Note (Signed)
Mild to mod, for antibx course,  to f/u any worsening symptoms or concerns 

## 2012-04-30 NOTE — Assessment & Plan Note (Signed)
For ENT f/u, will refer

## 2012-04-30 NOTE — Assessment & Plan Note (Signed)
stable overall by history and exam, recent data reviewed with pt, and pt to continue medical treatment as before,  to f/u any worsening symptoms or concerns Lab Results  Component Value Date   WBC 8.8 02/04/2011   HGB 11.7* 02/04/2011   HCT 35.2* 02/04/2011   PLT 402* 02/04/2011   GLUCOSE 113* 02/04/2011   ALT 30 02/03/2011   AST 26 02/03/2011   NA 134* 02/04/2011   K 4.3 02/04/2011   CL 97 02/04/2011   CREATININE 0.93 02/04/2011   BUN 14 02/04/2011   CO2 26 02/04/2011

## 2012-04-30 NOTE — Assessment & Plan Note (Signed)
stable overall by history and exam, recent data reviewed with pt, and pt to continue medical treatment as before,  to f/u any worsening symptoms or concerns SpO2 Readings from Last 3 Encounters:  04/30/12 92%  03/07/12 93%  12/20/11 97%

## 2012-04-30 NOTE — Progress Notes (Signed)
Subjective:    Patient ID: Nicholas Cobb, male    DOB: [birth year], 62 y.o.   MRN: [id number]  HPI  Here to f/u with c/o 2-3 days acute onset fever, right facial pain, pressure, headache, general weakness and malaise, and greenish d/c, with mild ST and cough, but pt denies chest pain, wheezing, increased sob or doe, orthopnea, PND, increased LE swelling, palpitations, dizziness or syncope. Pt denies new neurological symptoms such as new headache, or facial or extremity weakness or numbness  Pt denies polydipsia, polyuria, or low sugar symptoms such as weakness or confusion improved with po intake.  Pt states overall good compliance with meds, trying to follow lower cholesterol, diabetic diet, wt overall stable but little exercise however.   Asks for f/u referral to Dr Sherrie Mustache, ENT in [place].  Overall pain no change, stable on current Past Medical History  Diagnosis Date  . Hearing loss   . Change in voice   . Rash   . Trouble swallowing   . Difficulty urinating   . Cervical spondylosis 03/25/2011  . Emphysema 03/25/2011  . GERD (gastroesophageal reflux disease) 03/25/2011  . Chronic pain 03/25/2011  . 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 Xerostomia 03/25/2011  . Ulcer   . UTI (lower urinary tract infection)   . Allergy   . Chronic sinusitis 04/27/2011  . Impaired glucose tolerance 04/27/2011  . Cervical spondylolysis 04/30/2011    severe  . Cancer 1999    throat cancer   Past Surgical History  Procedure Laterality Date  . Peg tube placement  11/1999  . Tracheostomy  11/1999  . Gastrostomy tube placement      reports that he quit smoking about 15 years ago. He has never used smokeless tobacco. He reports that he does not drink alcohol or use illicit drugs. family history includes Cancer in his other; Diabetes in his other; and Stroke in his other. Allergies  Allergen Reactions  . Codeine Nausea And Vomiting   Current Outpatient Prescriptions on File Prior to Visit  Medication Sig  Dispense Refill  . aspirin 81 MG EC tablet Take 1 tablet (81 mg total) by mouth daily. Swallow whole.  30 tablet  12  . GuaiFENesin (MUCINEX PO) Take 1 tablet by mouth 2 (two) times daily as needed. For chest congestion      . HYDROcodone-acetaminophen (NORCO) 10-325 MG per tablet Take 1 tablet by mouth 3 (three) times daily as needed for pain.  90 tablet  2  . ibuprofen (ADVIL,MOTRIN) 400 MG tablet Take 1 tablet (400 mg total) by mouth 3 (three) times daily as needed.  90 tablet  2  . ranitidine (ZANTAC) 150 MG tablet Take 1 tablet (150 mg total) by mouth 2 (two) times daily.  180 tablet  3  . sildenafil (VIAGRA) 100 MG tablet Take 0.5-1 tablets (50-100 mg total) by mouth daily as needed for erectile dysfunction.  5 tablet  11   No current facility-administered medications on file prior to visit.   Review of Systems  Constitutional: Negative for unexpected weight change, or unusual diaphoresis  HENT: Negative for tinnitus.   Eyes: Negative for photophobia and visual disturbance.  Respiratory: Negative for choking and stridor.   Gastrointestinal: Negative for vomiting and blood in stool.  Genitourinary: Negative for hematuria and decreased urine volume.  Musculoskeletal: Negative for acute joint swelling Skin: Negative for color change and wound.  Neurological: Negative for tremors and numbness other than noted  Psychiatric/Behavioral: Negative for decreased concentration or  hyperactivity.  Objective:   Physical Exam BP 132/82  Pulse 85  Temp(Src) 97.1 F (36.2 C) (Oral)  Wt 166 lb 8 oz (75.524 kg)  BMI 24.58 kg/m2  SpO2 92% VS noted, mild ill Constitutional: Pt appears somewhat thin Speaks with trache, very difficult to understand, high pitched and faint HENT: Head: NCAT. S/p ENT surgury as before but mild to mod tender/swelling increased to the right maxillary sinus area Bilat tm's with no erythema.   Pharynx unable to assess due to prior surgury Right Ear: External ear  normal.  Left Ear: External ear normal.  Eyes: Conjunctivae and EOM are normal. Pupils are equal, round, and reactive to light.  Neck: some tender bilat submandibular Cardiovascular: Normal rate and regular rhythm.   Pulmonary/Chest: Effort normal and breath sounds normal.  Neurological: Pt is alert. Not confused  Skin: Skin is warm. No erythema.  Psychiatric: Pt behavior is normal. Thought content normal. 1+ nervous    Assessment & Plan:

## 2012-05-01 ENCOUNTER — Telehealth: Payer: Self-pay

## 2012-05-01 ENCOUNTER — Encounter: Payer: Self-pay | Admitting: Internal Medicine

## 2012-05-01 NOTE — Telephone Encounter (Signed)
Ok to cont as is, since the cough med dose NOT have codeine in it, only Hydrocodone which is not the same and normally tolerated well

## 2012-05-01 NOTE — Telephone Encounter (Signed)
The patient would like alternative to cough med. Sent in on the 18th.  He cannot take anything with codeine and the pharmacist informed him that the cough med. Had codeine in it.

## 2012-05-01 NOTE — Telephone Encounter (Signed)
Called the patient informed of MD instructions. 

## 2012-05-09 ENCOUNTER — Ambulatory Visit (INDEPENDENT_AMBULATORY_CARE_PROVIDER_SITE_OTHER): Payer: Medicare Other | Admitting: General Surgery

## 2012-05-09 ENCOUNTER — Encounter (INDEPENDENT_AMBULATORY_CARE_PROVIDER_SITE_OTHER): Payer: Self-pay | Admitting: General Surgery

## 2012-05-09 VITALS — BP 118/66 | HR 72 | Temp 98.1°F | Resp 12 | Ht 70.0 in | Wt 168.0 lb

## 2012-05-09 DIAGNOSIS — Z431 Encounter for attention to gastrostomy: Secondary | ICD-10-CM

## 2012-05-09 NOTE — Progress Notes (Signed)
Subjective:     Patient ID: Margarette Asal, male   DOB: Sep 11, 1950, 62 y.o.   MRN: [id number]  HPI   Mr. Baris is here because his G-tube is not working well and he would like to have her replaced.   Review of Systems     Objective:   Physical Exam Gen.-he looks well and is in no acute distress.  Abdomen-gastrostomy tube is in left upper quadrant and was removed. A 24 French MIC gastrostomy tube was then placed and he tolerated this well.    Assessment:     Malfunctioning gastrostomy tube-replaced.    Plan:     Return as needed for tube replacement.

## 2012-05-09 NOTE — Patient Instructions (Signed)
Call when you start having problems with the G-tube.

## 2012-05-20 ENCOUNTER — Other Ambulatory Visit: Payer: Self-pay

## 2012-05-20 MED ORDER — HYDROCODONE-ACETAMINOPHEN 10-325 MG PO TABS: 1.0000 | ORAL_TABLET | Freq: Three times a day (TID) | ORAL | Status: DC | PRN

## 2012-05-20 NOTE — Telephone Encounter (Signed)
Done hardcopy to robin  

## 2012-05-20 NOTE — Telephone Encounter (Signed)
Faxed hardcopy to pharmacy. 

## 2012-05-22 ENCOUNTER — Telehealth: Payer: Self-pay | Admitting: Internal Medicine

## 2012-05-22 NOTE — Telephone Encounter (Signed)
Called [facility] and they are needing to update his statement of medical necessity as has expired and need ok signature by PCP.  Informed of fax number to send to Dr. Jonny Ruiz to sign for this patient.

## 2012-05-22 NOTE — Telephone Encounter (Signed)
NEEDS NEW ORDERS FOR FOOD SUPPLY, PLEASE CALL [facility] [phone]

## 2012-06-03 ENCOUNTER — Emergency Department (HOSPITAL_COMMUNITY)
Admission: EM | Admit: 2012-06-03 | Discharge: 2012-06-03 | Disposition: A | Discharge status: Home Health | Payer: Medicare Other | Attending: Emergency Medicine | Admitting: Emergency Medicine

## 2012-06-03 ENCOUNTER — Emergency Department (HOSPITAL_COMMUNITY): Payer: Medicare Other

## 2012-06-03 ENCOUNTER — Encounter (HOSPITAL_COMMUNITY): Payer: Self-pay | Admitting: *Deleted

## 2012-06-03 DIAGNOSIS — Z872 Personal history of diseases of the skin and subcutaneous tissue: Secondary | ICD-10-CM | POA: Insufficient documentation

## 2012-06-03 DIAGNOSIS — Z8719 Personal history of other diseases of the digestive system: Secondary | ICD-10-CM | POA: Insufficient documentation

## 2012-06-03 DIAGNOSIS — Z9889 Other specified postprocedural states: Secondary | ICD-10-CM | POA: Insufficient documentation

## 2012-06-03 DIAGNOSIS — Z87891 Personal history of nicotine dependence: Secondary | ICD-10-CM | POA: Insufficient documentation

## 2012-06-03 DIAGNOSIS — Z8739 Personal history of other diseases of the musculoskeletal system and connective tissue: Secondary | ICD-10-CM | POA: Insufficient documentation

## 2012-06-03 DIAGNOSIS — Z79899 Other long term (current) drug therapy: Secondary | ICD-10-CM | POA: Insufficient documentation

## 2012-06-03 DIAGNOSIS — Z85819 Personal history of malignant neoplasm of unspecified site of lip, oral cavity, and pharynx: Secondary | ICD-10-CM | POA: Insufficient documentation

## 2012-06-03 DIAGNOSIS — Z87440 Personal history of urinary (tract) infections: Secondary | ICD-10-CM | POA: Insufficient documentation

## 2012-06-03 DIAGNOSIS — J9500 Unspecified tracheostomy complication: Secondary | ICD-10-CM

## 2012-06-03 DIAGNOSIS — Z8669 Personal history of other diseases of the nervous system and sense organs: Secondary | ICD-10-CM | POA: Insufficient documentation

## 2012-06-03 LAB — CBC WITH DIFFERENTIAL/PLATELET
Basophils Relative: 0 % (ref 0–1)
Eosinophils Absolute: 0.1 10*3/uL (ref 0.0–0.7)
Eosinophils Relative: 2 % (ref 0–5)
HCT: 40.5 % (ref 39.0–52.0)
Hemoglobin: 14.2 g/dL (ref 13.0–17.0)
Lymphs Abs: 0.8 10*3/uL (ref 0.7–4.0)
MCH: 30.6 pg (ref 26.0–34.0)
MCHC: 35.1 g/dL (ref 30.0–36.0)
MCV: 87.3 fL (ref 78.0–100.0)
Monocytes Absolute: 0.9 10*3/uL (ref 0.1–1.0)
Monocytes Relative: 16 % — ABNORMAL HIGH (ref 3–12)
RBC: 4.64 MIL/uL (ref 4.22–5.81)

## 2012-06-03 LAB — BASIC METABOLIC PANEL
BUN: 15 mg/dL (ref 6–23)
Creatinine, Ser: 0.9 mg/dL (ref 0.50–1.35)
GFR calc Af Amer: >90 mL/min (ref >90)
GFR calc non Af Amer: 88 mL/min — ABNORMAL LOW (ref >90)
Glucose, Bld: 125 mg/dL — ABNORMAL HIGH (ref 70–99)
Potassium: 4.6 mEq/L (ref 3.5–5.1)

## 2012-06-03 MED ORDER — SULFAMETHOXAZOLE-TMP DS 800-160 MG PO TABS: 1.0000 | ORAL_TABLET | Freq: Two times a day (BID) | ORAL | Status: DC

## 2012-06-03 MED ORDER — SULFAMETHOXAZOLE-TMP DS 800-160 MG PO TABS
1.0000 | ORAL_TABLET | Freq: Once | ORAL | Status: AC
  Administered 2012-06-03: 1 via ORAL
  Filled 2012-06-03: qty 1

## 2012-06-03 NOTE — ED Provider Notes (Signed)
History     CSN: [id number]  Arrival date & time 06/03/12  0500   First MD Initiated Contact with Patient 06/03/12 [phone]      Chief Complaint  Patient presents with  . Shortness of Breath    (Consider location/radiation/quality/duration/timing/severity/associated sxs/prior treatment) HPI 62 yo male presents to the ER from home via EMS with complaint of trach problem.  Pt reports he had his trach changed out this past Monday while at his ENT doctor's office in [place].  Pt reports trach for the last 11 years due to cancer.  Pt reports he feels the intern that replaced it inserted it wrong.  He feels that it is protruding more than it should from his neck and he is having secretions leak around the trach.  He has not contacted his ENT.  No fevers, chills.  Pt reports he has ongoing swelling and infection of his right face and trach from radiation.  He is due to have hyperbaric oxygen treatment at [facility].  Pt feels short of breath at times.   Past Medical History  Diagnosis Date  . Hearing loss   . Change in voice   . Rash   . Trouble swallowing   . Difficulty urinating   . Cervical spondylosis 03/25/2011  . Emphysema 03/25/2011  . GERD (gastroesophageal reflux disease) 03/25/2011  . Chronic pain 03/25/2011  . 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 Xerostomia 03/25/2011  . Ulcer   . UTI (lower urinary tract infection)   . Allergy   . Chronic sinusitis 04/27/2011  . Impaired glucose tolerance 04/27/2011  . Cervical spondylolysis 04/30/2011    severe  . Cancer 1999    throat cancer    Past Surgical History  Procedure Laterality Date  . Peg tube placement  11/1999  . Tracheostomy  11/1999  . Gastrostomy tube placement      Family History  Problem Relation Age of Onset  . Stroke Other   . Diabetes Other   . Cancer Other     lung cancer    History  Substance Use Topics  . Smoking status: Former Smoker    Quit date: 11/06/1996  . Smokeless tobacco: Never Used  . Alcohol Use: No       Review of Systems  All other systems reviewed and are negative.    Allergies  Codeine  Home Medications   Current Outpatient Rx  Name  Route  Sig  Dispense  Refill  . HYDROcodone-acetaminophen (NORCO) 10-325 MG per tablet   Oral   Take 1 tablet by mouth 3 (three) times daily as needed for pain.   90 tablet   2   . ibuprofen (ADVIL,MOTRIN) 400 MG tablet   Oral   Take 400 mg by mouth 3 (three) times daily as needed for pain.         . ranitidine (ZANTAC) 150 MG tablet   Oral   Take 1 tablet (150 mg total) by mouth 2 (two) times daily.   180 tablet   3   . sulfamethoxazole-trimethoprim (BACTRIM DS) 800-160 MG per tablet   Oral   Take 1 tablet by mouth 2 (two) times daily.   20 tablet   0     BP 185/114  Pulse 90  Temp(Src) 98.2 F (36.8 C) (Oral)  Resp 18  SpO2 97%  Physical Exam  Nursing note and vitals reviewed. Constitutional: He is oriented to person, place, and time. He appears well-developed and well-nourished.  HENT:  Head: Normocephalic and  atraumatic.  Right Ear: External ear normal.  Left Ear: External ear normal.  Nose: Nose normal.  Mouth/Throat: Oropharynx is clear and moist.  Eyes: Conjunctivae and EOM are normal. Pupils are equal, round, and reactive to light.  Neck: Normal range of motion. Neck supple. No JVD present. No tracheal deviation present. No thyromegaly present.  Trach in place, with irritation around trach site.  Pt has mucus and purulent discharge around site.  No fluctuance, no signs of cellulitis, no creptius.  Pt's neck has some contracture, and it is difficult for him to fully extend to get good access to the trach.  No signs of dislodgment.  Obturator passes easily.  Suction catheter passes easily.  End tidal CO2 with appropriate color change  Cardiovascular: Normal rate, regular rhythm, normal heart sounds and intact distal pulses.  Exam reveals no gallop and no friction rub.   No murmur heard. Pulmonary/Chest: Effort  normal and breath sounds normal. No stridor. No respiratory distress. He has no wheezes. He has no rales. He exhibits no tenderness.  Abdominal: Soft. Bowel sounds are normal. He exhibits no distension and no mass. There is no tenderness. There is no rebound and no guarding.  Musculoskeletal: Normal range of motion. He exhibits no edema and no tenderness.  Lymphadenopathy:    He has no cervical adenopathy.  Neurological: He is alert and oriented to person, place, and time. A cranial nerve deficit is present. He exhibits normal muscle tone. Coordination normal.  Skin: Skin is warm and dry. No rash noted. No erythema. No pallor.  Psychiatric: His behavior is normal. Judgment and thought content normal.  irritable    ED Course  Procedures (including critical care time)  Labs Reviewed  CBC WITH DIFFERENTIAL - Abnormal; Notable for the following:    Monocytes Relative 16 (*)    All other components within normal limits  BASIC METABOLIC PANEL - Abnormal; Notable for the following:    Sodium 133 (*)    Chloride 93 (*)    CO2 34 (*)    Glucose, Bld 125 (*)    GFR calc non Af Amer 88 (*)    All other components within normal limits   Dg Neck Soft Tissue  06/03/2012  *RADIOLOGY REPORT*  Clinical Data: Shortness of breath  NECK SOFT TISSUES - 1+ VIEW  Comparison: 02/23/2011 CT  Findings: Limited lateral view as the patient was unable to extend his neck. Partially imaged tracheostomy tube.  Paravertebral soft tissue fullness is similar to prior. No radiopaque foreign body or retropharyngeal gas.  Exaggerated kyphosis and multilevel degenerative change.  IMPRESSION: Limited lateral view as above. Fullness of the paravertebral soft tissues, without retropharyngeal gas.   Original Report Authenticated By: Jearld Lesch, M.D.    Dg Chest 2 View  06/03/2012  *RADIOLOGY REPORT*  Clinical Data: Shortness of breath, congestion  CHEST - 2 VIEW  Comparison: 03/07/2012  Findings: Tracheostomy tube.   Partially imaged mandible hardware. Eventration right hemidiaphragm.  Linear right mid lung opacity, likely scarring or atelectasis.  Surgical clips project over the right neck/right lung apex.  No confluent airspace opacity, pleural effusion, or pneumothorax.  Cardiomediastinal contours within normal range.  IMPRESSION: Tracheostomy tube.  No radiographic evidence of acute cardiopulmonary process.   Original Report Authenticated By: Jearld Lesch, M.D.      1. Tracheostomy complication, unspecified       MDM  62 yo male with 11 year history of trach, s/p replacement one week ago at his ENT  visit.  Janina Mayo appears in place at this time, thought patient feels it is misaligned.  Pt with purulent drainage at trach site.  Will start bactrim for possible infection.  Pt advised to f/u with his ENT.         Olivia Mackie, MD 06/03/12 1946

## 2012-06-03 NOTE — ED Notes (Signed)
Pt states his trach was changed last Monday and he has been having trouble breathing off and on since.  Pt states after having his trach for 11 years he knows when it is wrong

## 2012-06-03 NOTE — ED Notes (Signed)
Dr Norlene Campbell in room with pt

## 2012-06-03 NOTE — ED Notes (Signed)
Patient given medicine via g-tube, administered then subsequently flushed with water, patient awaiting d/c instructions at this time

## 2012-06-03 NOTE — ED Notes (Signed)
Dr Otter in room with pt 

## 2012-06-04 ENCOUNTER — Encounter (HOSPITAL_COMMUNITY): Payer: Self-pay | Admitting: *Deleted

## 2012-06-04 ENCOUNTER — Emergency Department (HOSPITAL_COMMUNITY)
Admission: EM | Admit: 2012-06-04 | Discharge: 2012-06-04 | Disposition: A | Discharge status: Home / Self Care | Payer: Medicare Other | Attending: Emergency Medicine | Admitting: Emergency Medicine

## 2012-06-04 ENCOUNTER — Emergency Department (HOSPITAL_COMMUNITY): Payer: Medicare Other

## 2012-06-04 DIAGNOSIS — R0989 Other specified symptoms and signs involving the circulatory and respiratory systems: Secondary | ICD-10-CM | POA: Insufficient documentation

## 2012-06-04 DIAGNOSIS — Z862 Personal history of diseases of the blood and blood-forming organs and certain disorders involving the immune mechanism: Secondary | ICD-10-CM | POA: Insufficient documentation

## 2012-06-04 DIAGNOSIS — Z87448 Personal history of other diseases of urinary system: Secondary | ICD-10-CM | POA: Insufficient documentation

## 2012-06-04 DIAGNOSIS — R0609 Other forms of dyspnea: Secondary | ICD-10-CM | POA: Insufficient documentation

## 2012-06-04 DIAGNOSIS — J9503 Malfunction of tracheostomy stoma: Secondary | ICD-10-CM | POA: Insufficient documentation

## 2012-06-04 DIAGNOSIS — Z872 Personal history of diseases of the skin and subcutaneous tissue: Secondary | ICD-10-CM | POA: Insufficient documentation

## 2012-06-04 DIAGNOSIS — H9190 Unspecified hearing loss, unspecified ear: Secondary | ICD-10-CM | POA: Insufficient documentation

## 2012-06-04 DIAGNOSIS — Z87891 Personal history of nicotine dependence: Secondary | ICD-10-CM | POA: Insufficient documentation

## 2012-06-04 DIAGNOSIS — Z8739 Personal history of other diseases of the musculoskeletal system and connective tissue: Secondary | ICD-10-CM | POA: Insufficient documentation

## 2012-06-04 DIAGNOSIS — K219 Gastro-esophageal reflux disease without esophagitis: Secondary | ICD-10-CM | POA: Insufficient documentation

## 2012-06-04 DIAGNOSIS — J9500 Unspecified tracheostomy complication: Secondary | ICD-10-CM

## 2012-06-04 DIAGNOSIS — G8929 Other chronic pain: Secondary | ICD-10-CM | POA: Insufficient documentation

## 2012-06-04 DIAGNOSIS — C329 Malignant neoplasm of larynx, unspecified: Secondary | ICD-10-CM | POA: Insufficient documentation

## 2012-06-04 DIAGNOSIS — Z8719 Personal history of other diseases of the digestive system: Secondary | ICD-10-CM | POA: Insufficient documentation

## 2012-06-04 DIAGNOSIS — Z8709 Personal history of other diseases of the respiratory system: Secondary | ICD-10-CM | POA: Insufficient documentation

## 2012-06-04 DIAGNOSIS — Z79899 Other long term (current) drug therapy: Secondary | ICD-10-CM | POA: Insufficient documentation

## 2012-06-04 DIAGNOSIS — Z8639 Personal history of other endocrine, nutritional and metabolic disease: Secondary | ICD-10-CM | POA: Insufficient documentation

## 2012-06-04 DIAGNOSIS — Z87440 Personal history of urinary (tract) infections: Secondary | ICD-10-CM | POA: Insufficient documentation

## 2012-06-04 LAB — CBC
HCT: 39.2 % (ref 39.0–52.0)
Hemoglobin: 13.6 g/dL (ref 13.0–17.0)
MCH: 29.8 pg (ref 26.0–34.0)
MCHC: 34.7 g/dL (ref 30.0–36.0)
MCV: 86 fL (ref 78.0–100.0)
Platelets: 315 K/uL (ref 150–400)
RBC: 4.56 MIL/uL (ref 4.22–5.81)
RDW: 13 % (ref 11.5–15.5)
WBC: 5.6 K/uL (ref 4.0–10.5)

## 2012-06-04 LAB — BASIC METABOLIC PANEL
BUN: 18 mg/dL (ref 6–23)
Calcium: 9.3 mg/dL (ref 8.4–10.5)
Chloride: 93 mEq/L — ABNORMAL LOW (ref 96–112)
Creatinine, Ser: 1.14 mg/dL (ref 0.50–1.35)
GFR calc Af Amer: 77 mL/min — ABNORMAL LOW (ref >90)

## 2012-06-04 LAB — CG4 I-STAT (LACTIC ACID): Lactic Acid, Venous: 0.84 mmol/L (ref 0.5–2.2)

## 2012-06-04 MED ORDER — METHYLPREDNISOLONE 4 MG PO KIT: PACK | ORAL | Status: DC

## 2012-06-04 NOTE — ED Notes (Signed)
Pt has trach in place and is having breathing issues.  Sats 92% RA.  Pt is talking and alert.  Pt thinks he may need suction  Pt has trach from cancer

## 2012-06-04 NOTE — ED Notes (Addendum)
Dr. Jeraldine Loots at the bedside with RT to replace trach with uncuffed. RT attempting to suction pt.

## 2012-06-04 NOTE — ED Provider Notes (Signed)
History     CSN: [id number]  Arrival date & time 06/04/12  1455   First MD Initiated Contact with Patient 06/04/12 1538      Chief Complaint  Patient presents with  . Shortness of Breath     HPI  Patient presents for second time several days with ongoing concern over neck discomfort and possible trach displacement.  He is a long history of trach dependency following laryngeal cancer in the distant past with subsequent radiation therapy. He states that one week ago he had his tracheostomy tube exchanged.  Since that time he has had persistent/worsening discomfort in his neck.  There are new secretions.  He complains of congestion in the area.  He denies fever, chills, vomiting. He was seen here yesterday, states that since discharge she continues to have the aforementioned discomfort after a minor improvement in his condition.   Past Medical History  Diagnosis Date  . Hearing loss   . Change in voice   . Rash   . Trouble swallowing   . Difficulty urinating   . Cervical spondylosis 03/25/2011  . Emphysema 03/25/2011  . GERD (gastroesophageal reflux disease) 03/25/2011  . Chronic pain 03/25/2011  . 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 Xerostomia 03/25/2011  . Ulcer   . UTI (lower urinary tract infection)   . Allergy   . Chronic sinusitis 04/27/2011  . Impaired glucose tolerance 04/27/2011  . Cervical spondylolysis 04/30/2011    severe  . Cancer 1999    throat cancer    Past Surgical History  Procedure Laterality Date  . Peg tube placement  11/1999  . Tracheostomy  11/1999  . Gastrostomy tube placement      Family History  Problem Relation Age of Onset  . Stroke Other   . Diabetes Other   . Cancer Other     lung cancer    History  Substance Use Topics  . Smoking status: Former Smoker    Quit date: 11/06/1996  . Smokeless tobacco: Never Used  . Alcohol Use: No      Review of Systems  All other systems reviewed and are negative.    Allergies  Codeine  Home  Medications   Current Outpatient Rx  Name  Route  Sig  Dispense  Refill  . HYDROcodone-acetaminophen (NORCO) 10-325 MG per tablet   Oral   Take 1 tablet by mouth 3 (three) times daily as needed for pain.   90 tablet   2   . ibuprofen (ADVIL,MOTRIN) 400 MG tablet   Oral   Take 400 mg by mouth 3 (three) times daily as needed for pain.         . ranitidine (ZANTAC) 150 MG tablet   Oral   Take 1 tablet (150 mg total) by mouth 2 (two) times daily.   180 tablet   3   . sulfamethoxazole-trimethoprim (BACTRIM DS) 800-160 MG per tablet   Oral   Take 1 tablet by mouth 2 (two) times daily.   20 tablet   0     BP 135/81  Pulse 102  Temp(Src) 99.1 F (37.3 C) (Oral)  Resp 22  SpO2 92%  Physical Exam  Nursing note and vitals reviewed. Constitutional: He is oriented to person, place, and time. He appears well-developed and well-nourished.  HENT:  Head: Normocephalic and atraumatic.  Right Ear: External ear normal.  Left Ear: External ear normal.  Nose: Nose normal.  Mouth/Throat: Oropharynx is clear and moist.  Eyes: Conjunctivae and EOM are  normal. Pupils are equal, round, and reactive to light.  Neck: Neck supple. No thyromegaly present.    Trach in place, with irritation around trach site.  Pt has mucus and purulent discharge around site.  No fluctuance, no signs of cellulitis, no creptius.  Pt's neck has some contracture, and it is difficult for him to fully extend to get good access to the trach, .  No signs of dislodgment.  Obturator passes easily.  Suction catheter passes easily.  End tidal CO2 with appropriate color change  Cardiovascular: Normal rate, regular rhythm, normal heart sounds and intact distal pulses.  Exam reveals no gallop and no friction rub.   No murmur heard. Pulmonary/Chest: Effort normal and breath sounds normal. No respiratory distress. He has no wheezes. He has no rales. He exhibits no tenderness.  Abdominal: Soft. Bowel sounds are normal. He  exhibits no distension and no mass. There is no tenderness. There is no rebound and no guarding.  Musculoskeletal: Normal range of motion. He exhibits no edema and no tenderness.  Lymphadenopathy:    He has no cervical adenopathy.  Neurological: He is alert and oriented to person, place, and time. He displays atrophy. A cranial nerve deficit is present. Coordination normal.  Skin: Skin is warm and dry. No rash noted. No erythema. No pallor.  Psychiatric: His behavior is normal. Judgment and thought content normal.  irritable    ED Course  TRACHEOSTOMY REPLACEMENT Date/Time: 06/04/2012 6:09 PM Performed by: [name] Authorized by: [name] Consent: Verbal consent obtained. Risks and benefits: risks, benefits and alternatives were discussed Consent given by: patient Patient understanding: patient states understanding of the procedure being performed Patient consent: the patient's understanding of the procedure matches consent given Procedure consent: procedure consent matches procedure scheduled Relevant documents: relevant documents present and verified Test results: test results available and properly labeled Site marked: the operative site was marked Imaging studies: imaging studies available Patient identity confirmed: verbally with patient Time out: Immediately prior to procedure a "time out" was called to verify the correct patient, procedure, equipment, support staff and site/side marked as required. Indications: became dislodged, malfunction and obstruction Local anesthesia used: no Patient sedated: no Preparation: Patient was prepped and draped in the usual sterile fashion. Tube type: single cannula Tube cuff: cuffless Tube size: 6.0 mm Cuff type: tight to shaft Complications: mucous plug Patient tolerance: Patient tolerated the procedure well with no immediate complications. Comments: The bedside attempt to replace the tracheostomy tube was questionably  successful, with the new tube not fully engaged, but with air flow.  The suctioning catheter could not be passed through the tube.   (including critical care time)  Labs Reviewed  BASIC METABOLIC PANEL - Abnormal; Notable for the following:    Sodium 130 (*)    Chloride 93 (*)    Glucose, Bld 111 (*)    GFR calc non Af Amer 66 (*)    GFR calc Af Amer 77 (*)    All other components within normal limits  CBC  CG4 I-STAT (LACTIC ACID)   Dg Neck Soft Tissue  06/03/2012  *RADIOLOGY REPORT*  Clinical Data: Shortness of breath  NECK SOFT TISSUES - 1+ VIEW  Comparison: 02/23/2011 CT  Findings: Limited lateral view as the patient was unable to extend his neck. Partially imaged tracheostomy tube.  Paravertebral soft tissue fullness is similar to prior. No radiopaque foreign body or retropharyngeal gas.  Exaggerated kyphosis and multilevel degenerative change.  IMPRESSION: Limited lateral view as above. Fullness  of the paravertebral soft tissues, without retropharyngeal gas.   Original Report Authenticated By: Jearld Lesch, M.D.    Dg Chest 2 View (if Patient Has Fever And/or Copd)  06/04/2012  *RADIOLOGY REPORT*  Clinical Data: Shortness of breath and cough  CHEST - 2 VIEW  Comparison: 06/03/2012 and prior chest radiographs  Findings: The cardiomediastinal silhouette is unremarkable. A tracheostomy tube and peribronchial thickening again noted. There is no evidence of focal airspace disease, pulmonary edema, suspicious pulmonary nodule/mass, pleural effusion, or pneumothorax. No acute bony abnormalities are identified.  IMPRESSION: No evidence of acute cardiopulmonary disease.  Mild chronic peribronchial thickening.   Original Report Authenticated By: Harmon Pier, M.D.    Dg Chest 2 View  06/03/2012  *RADIOLOGY REPORT*  Clinical Data: Shortness of breath, congestion  CHEST - 2 VIEW  Comparison: 03/07/2012  Findings: Tracheostomy tube.  Partially imaged mandible hardware. Eventration right  hemidiaphragm.  Linear right mid lung opacity, likely scarring or atelectasis.  Surgical clips project over the right neck/right lung apex.  No confluent airspace opacity, pleural effusion, or pneumothorax.  Cardiomediastinal contours within normal range.  IMPRESSION: Tracheostomy tube.  No radiographic evidence of acute cardiopulmonary process.   Original Report Authenticated By: Jearld Lesch, M.D.      No diagnosis found.  Following initial evaluation I reviewed the patient's chart.  The patient then received suctioning and subsequently with the assistance of respiratory therapy we attempted to exchange his tracheostomy tube this attempt was difficult, with tube seemingly blocked, there was good air flow through the new tracheostomy tube.  Subsequently discussed the patient with our ENT on call.  Pulse ox 94% tracheostomy abnormal   MDM  Patient presents with concerns over ongoing secretions around a possibly dislodged tracheostomy tube.  The patient has multiple medical problems, he is generally in his baseline state of health aside from his new concern over the tracheostomy bowel function heard The patient's x-ray does not demonstrate pneumonia, and his labs are unremarkable, reassuring for the low suspicion of systemic infection.  Following bedside exchange of his tracheostomy tube, ENT was consulted due to questions about tube placement. ENT  (Dr. Emeline Darling) confirmed placement, and the patient was d/c to f/U with his primary ENT physician in [place].        [name], MD 06/04/12 [phone]

## 2012-06-04 NOTE — ED Notes (Signed)
RT able to replace to trach with a 6.0 and pt able to pass air and talked with new trach. RT still unable to pass suction cath very far. No respiratory distress noted. VSS.

## 2012-06-04 NOTE — Consult Note (Signed)
Nicholas Cobb, Nicholas Cobb [id number] 1950/05/01 Nicholas Munch, MD  Reason for Consult: possible dislodged tracheostomy tube.  HPI:62yo with longstanding trach dependence due to treatment for laryngeal cancer with some subjective dyspnea and concern for possible dislodged trach  Allergies:  Allergies  Allergen Reactions  . Codeine Nausea And Vomiting    ROS: subjective dyspnea, otherwise negative x 10 systems except per HPI  PMH:  Past Medical History  Diagnosis Date  . Hearing loss   . Change in voice   . Rash   . Trouble swallowing   . Difficulty urinating   . Cervical spondylosis 03/25/2011  . Emphysema 03/25/2011  . GERD (gastroesophageal reflux disease) 03/25/2011  . Chronic pain 03/25/2011  . 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 Xerostomia 03/25/2011  . Ulcer   . UTI (lower urinary tract infection)   . Allergy   . Chronic sinusitis 04/27/2011  . Impaired glucose tolerance 04/27/2011  . Cervical spondylolysis 04/30/2011    severe  . Cancer 1999    throat cancer    FH:  Family History  Problem Relation Age of Onset  . Stroke Other   . Diabetes Other   . Cancer Other     lung cancer    SH:  History   Social History  . Marital Status: Single    Spouse Name: N/A    Number of Children: N/A  . Years of Education: 12   Occupational History  . retired    Social History Main Topics  . Smoking status: Former Smoker    Quit date: 11/06/1996  . Smokeless tobacco: Never Used  . Alcohol Use: No  . Drug Use: No  . Sexually Active: Not on file   Other Topics Concern  . Not on file   Social History Narrative  . No narrative on file    PSH:  Past Surgical History  Procedure Laterality Date  . Peg tube placement  11/1999  . Tracheostomy  11/1999  . Gastrostomy tube placement      Physical  Exam: CN 2-12 grossly intact and symmetric. Dysphonic voice with finger occlusion but with audible voice and air escape from tracheotomy when seen. EAC/TMs normal BL. Oral cavity,  lips, gums, ororpharynx normal with no masses or lesions. Skin warm and dry. Nasal cavity without polyps or purulence. External nose and ears without masses or lesions. EOMI, PERRLA. Neck with post-operative changes and a well-healed trach site with some physiologic secretions and a patent 6 cuffless shiley trach in place with velcro trach ties.  Procedure Note: 31575 Informed verbal consent was obtained after explaining the risks (including bleeding and infection), benefits and alternatives of the procedure. Verbal timeout was performed prior to the procedure.  The 4mm flexible scope was advanced through the bilateral nasal cavity. The septum and turbinates appeared normal. The middle meatus was free of polyps of purulence. The eustachian tube, choana, and adenoids were normal in appearance. The tongue base, omega-shaped epiglottis, and hypopharynx, arytenoids, and especially the false vocal folds and true vocal folds show some likely radiation-related edema but no masses or lesions. The glottic aperture is an ~39mm slit.  Then performed tracheoscopy through the trach. The trach is in good position in the trachea well above the carina with some mild posterior wall granulation tissue at the distal tip of the trach tube, but otherwise normal and patent tracheal and carinal and mainstem bronchial mucosa. I then removed the trach and suctioned out the trachea and tracheostomy wit the Yankauer suction and then easily  replaced the 6 cuffless trach with the obturator and then replaced the inner cannula. Repeat scope again demonstrated good position of the trach in the tracheal lumen. I resecured the velcro ties. The patient tolerated the procedure with no immediate complications.  A/P: properly placed trach with history of laryngeal edema and some mild tracheal granulation tissue. Would recommend a prednisone taper and oral antibiotics for 2 weeks and he can follow up as needed with his usual ENT physician. Need to  have his velcro ties tight to 2 finger breadths to prevent trach dislodgement, and he should be suctioned gently to prevent tracheal trauma.   Nicholas Cobb 06/04/2012 6:27 PM

## 2012-06-04 NOTE — Progress Notes (Signed)
Called to patient room to assess patient Trach.  Nicholas Cobb was dislodged from neck about 1-1.5 inches. Unable to advance trach or pass suction cath.  Discussed with MD.  Nicholas Cobb removed and replaced with new #6 cuffless shiley still unable to advance trach all the way.  Positive CO2 color change, still unable to pass suction cath. MD at bedside during procedure and aware.  ENT notified.  RT will continue to monitor.

## 2012-06-04 NOTE — ED Notes (Signed)
ENT at the bedside with pt. ENT cart at the bedside.

## 2012-06-04 NOTE — ED Notes (Signed)
RT at the bedside with pt to assess pt.

## 2012-06-04 NOTE — ED Notes (Addendum)
Assisted pt with dressing change at this time. Pt states he does not like coming to this hospital and is normally seen in [facility]. Reports he was seen here for the same yesterday and feels like nothing helped. States he feels like his trach is not placed right and has been having difficulty with it.

## 2012-06-04 NOTE — ED Notes (Signed)
RT called for suction.

## 2012-06-06 ENCOUNTER — Telehealth: Payer: Self-pay | Admitting: Internal Medicine

## 2012-06-06 NOTE — Telephone Encounter (Signed)
There are several good laxatives OTC now, including dulcolox prn, senakot prn, and miralax daily  Please call or return if this does not work

## 2012-06-06 NOTE — Telephone Encounter (Signed)
Patient Information:  Caller Name: Sire  Phone: [phone]  Patient: Nicholas Cobb, Nicholas Cobb  Gender: Male  DOB: 03/30/1950  Age: 62 Years  PCP: Oliver Barre (Adults only)  Office Follow Up:  Does the office need to follow up with this patient?: Yes  Instructions For The Office: Patient insists on Rx for laxative krs/can  RN Note:  Patinet insisting on Rx being called in for laxative.  States  has not needed a laxative since June 2013, and "knows when I need a laxative."  States he wants Rx.  Denies emergent symptoms per constipation protocol; info to office per patient insistence on provider review/Rx/callback.  Uses [facility].  May reach patient at [phone].  krs/can  Symptoms  Reason For Call & Symptoms: constipation  Reviewed Health History In EMR: Yes  Reviewed Medications In EMR: Yes  Reviewed Allergies In EMR: Yes  Reviewed Surgeries / Procedures: Yes  Date of Onset of Symptoms: 06/05/2012  Guideline(s) Used:  Constipation  Disposition Per Guideline:   Home Care  Reason For Disposition Reached:   Mild constipation  Advice Given:  N/A  Patient Will Follow Care Advice:  YES

## 2012-06-06 NOTE — Telephone Encounter (Signed)
Called left message to call back 

## 2012-06-06 NOTE — Telephone Encounter (Signed)
Called the patient informed  Of MD recommendations for OTC laxatives.

## 2012-07-13 ENCOUNTER — Other Ambulatory Visit: Payer: Self-pay | Admitting: Internal Medicine

## 2012-07-23 ENCOUNTER — Emergency Department (HOSPITAL_COMMUNITY): Payer: Medicare Other

## 2012-07-23 ENCOUNTER — Encounter (HOSPITAL_COMMUNITY): Payer: Self-pay | Admitting: Emergency Medicine

## 2012-07-23 ENCOUNTER — Emergency Department (HOSPITAL_COMMUNITY)
Admission: EM | Admit: 2012-07-23 | Discharge: 2012-07-23 | Disposition: A | Discharge status: Home / Self Care | Payer: Medicare Other | Attending: Emergency Medicine | Admitting: Emergency Medicine

## 2012-07-23 DIAGNOSIS — R0602 Shortness of breath: Secondary | ICD-10-CM | POA: Insufficient documentation

## 2012-07-23 DIAGNOSIS — J40 Bronchitis, not specified as acute or chronic: Secondary | ICD-10-CM | POA: Insufficient documentation

## 2012-07-23 DIAGNOSIS — Z8639 Personal history of other endocrine, nutritional and metabolic disease: Secondary | ICD-10-CM | POA: Insufficient documentation

## 2012-07-23 DIAGNOSIS — Z79899 Other long term (current) drug therapy: Secondary | ICD-10-CM | POA: Insufficient documentation

## 2012-07-23 DIAGNOSIS — Z931 Gastrostomy status: Secondary | ICD-10-CM | POA: Insufficient documentation

## 2012-07-23 DIAGNOSIS — Z872 Personal history of diseases of the skin and subcutaneous tissue: Secondary | ICD-10-CM | POA: Insufficient documentation

## 2012-07-23 DIAGNOSIS — H9190 Unspecified hearing loss, unspecified ear: Secondary | ICD-10-CM | POA: Insufficient documentation

## 2012-07-23 DIAGNOSIS — Z862 Personal history of diseases of the blood and blood-forming organs and certain disorders involving the immune mechanism: Secondary | ICD-10-CM | POA: Insufficient documentation

## 2012-07-23 DIAGNOSIS — Z8521 Personal history of malignant neoplasm of larynx: Secondary | ICD-10-CM | POA: Insufficient documentation

## 2012-07-23 DIAGNOSIS — G8929 Other chronic pain: Secondary | ICD-10-CM | POA: Insufficient documentation

## 2012-07-23 DIAGNOSIS — J069 Acute upper respiratory infection, unspecified: Secondary | ICD-10-CM | POA: Insufficient documentation

## 2012-07-23 DIAGNOSIS — Z930 Tracheostomy status: Secondary | ICD-10-CM

## 2012-07-23 DIAGNOSIS — Z430 Encounter for attention to tracheostomy: Secondary | ICD-10-CM | POA: Insufficient documentation

## 2012-07-23 DIAGNOSIS — Z8719 Personal history of other diseases of the digestive system: Secondary | ICD-10-CM | POA: Insufficient documentation

## 2012-07-23 DIAGNOSIS — Z8739 Personal history of other diseases of the musculoskeletal system and connective tissue: Secondary | ICD-10-CM | POA: Insufficient documentation

## 2012-07-23 DIAGNOSIS — Z87440 Personal history of urinary (tract) infections: Secondary | ICD-10-CM | POA: Insufficient documentation

## 2012-07-23 DIAGNOSIS — Z8709 Personal history of other diseases of the respiratory system: Secondary | ICD-10-CM | POA: Insufficient documentation

## 2012-07-23 DIAGNOSIS — Z87891 Personal history of nicotine dependence: Secondary | ICD-10-CM | POA: Insufficient documentation

## 2012-07-23 DIAGNOSIS — K219 Gastro-esophageal reflux disease without esophagitis: Secondary | ICD-10-CM | POA: Insufficient documentation

## 2012-07-23 MED ORDER — SULFAMETHOXAZOLE-TRIMETHOPRIM 800-160 MG PO TABS: 1.0000 | ORAL_TABLET | Freq: Two times a day (BID) | ORAL | Status: DC

## 2012-07-23 MED ORDER — HYDROCOD POLST-CHLORPHEN POLST 10-8 MG/5ML PO LQCR: ORAL | Status: DC

## 2012-07-23 NOTE — ED Notes (Signed)
Respiratory at bedside to suction trach

## 2012-07-23 NOTE — Progress Notes (Signed)
11:27 AM Pt with trach from having had treatment for laryngeal cancer years ago, today has cough and increased mucus.  Lungs sound clear.  Chest x-ray negative.  Has Hx smoking in the past.  Will treat for bronchitis with Bactrim DS and Tussionex.

## 2012-07-23 NOTE — ED Notes (Signed)
Pt c/o cold like symptoms for 4-5 days. Pt has trach, says he has been suctioning/coughing green mucous, " gets winded" with ambulation. Pt alert and oriented.

## 2012-07-23 NOTE — ED Notes (Signed)
Pt ambulated to restroom. 

## 2012-07-23 NOTE — ED Provider Notes (Signed)
Medical screening examination/treatment/procedure(s) were conducted as a shared visit with non-physician practitioner(s) and myself.  I personally evaluated the patient during the encounter 11:27 AM  Pt with trach from having had treatment for laryngeal cancer years ago, today has cough and increased mucus. Lungs sound clear. Chest x-ray negative. Has Hx smoking in the past. Will treat for bronchitis with Bactrim DS and Tussionex.        Carleene Cooper III, MD 07/23/12 [phone]

## 2012-07-23 NOTE — ED Provider Notes (Signed)
History     CSN: [id number]  Arrival date & time 07/23/12  8657   First MD Initiated Contact with Patient 07/23/12 [phone]      Chief Complaint  Patient presents with  . URI    (Consider location/radiation/quality/duration/timing/severity/associated sxs/prior treatment) HPI Nicholas Cobb is a 62 y.o. male with a history of tracheostomy placement status post laryngeal cancer that presents to the emergency department complaining of cough, mucous production, and shortness of breath.  Patient denies any chest pain, fevers, night sweats, chills, hemoptysis, hematemesis, weight loss, leg swelling.  Patient reports he has an appointment for followup scheduled next week at [facility].  No other complaints at this time.  Past Medical History  Diagnosis Date  . Hearing loss   . Change in voice   . Rash   . Trouble swallowing   . Difficulty urinating   . Cervical spondylosis 03/25/2011  . Emphysema 03/25/2011  . GERD (gastroesophageal reflux disease) 03/25/2011  . Chronic pain 03/25/2011  . 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 Xerostomia 03/25/2011  . Ulcer   . UTI (lower urinary tract infection)   . Allergy   . Chronic sinusitis 04/27/2011  . Impaired glucose tolerance 04/27/2011  . Cervical spondylolysis 04/30/2011    severe  . Cancer 1999    throat cancer    Past Surgical History  Procedure Laterality Date  . Peg tube placement  11/1999  . Tracheostomy  11/1999  . Gastrostomy tube placement      Family History  Problem Relation Age of Onset  . Stroke Other   . Diabetes Other   . Cancer Other     lung cancer    History  Substance Use Topics  . Smoking status: Former Smoker    Quit date: 11/06/1996  . Smokeless tobacco: Never Used  . Alcohol Use: No      Review of Systems Ten systems reviewed and are negative for acute change, except as noted in the HPI.    Allergies  Codeine  Home Medications   Current Outpatient Rx  Name  Route  Sig  Dispense  Refill  .  HYDROcodone-acetaminophen (NORCO) 10-325 MG per tablet   Oral   Take 1 tablet by mouth 3 (three) times daily as needed for pain.   90 tablet   2   . ibuprofen (ADVIL,MOTRIN) 400 MG tablet      take 1 tablet by mouth three times a day if needed   90 tablet   2   . OVER THE COUNTER MEDICATION   Both Eyes   Place 2 drops into both eyes daily as needed (clear eyes). Murine eye drops         . ranitidine (ZANTAC) 150 MG tablet   Oral   Take 1 tablet (150 mg total) by mouth 2 (two) times daily.   180 tablet   3   . chlorpheniramine-HYDROcodone (TUSSIONEX PENNKINETIC ER) 10-8 MG/5ML LQCR      One teaspoon po every 12 hours prn cough.   60 mL   0   . sulfamethoxazole-trimethoprim (SEPTRA DS) 800-160 MG per tablet   Oral   Take 1 tablet by mouth every 12 (twelve) hours.   10 tablet   0     BP 141/74  Pulse 79  Temp(Src) 99 F (37.2 C) (Oral)  Resp 22  SpO2 95%  Physical Exam  Nursing note and vitals reviewed. Constitutional: He is oriented to person, place, and time. He appears well-developed and well-nourished. No  distress.  HENT:  Head: Normocephalic and atraumatic.  Eyes: Conjunctivae and EOM are normal.  Neck: Normal range of motion.  Tracheostomy in place without purulent drainage.  Cardiovascular:  Regular rate rhythm, no pitting edema  Pulmonary/Chest: Effort normal.  No acute respiratory distress.  Rhonchi bilaterally on auscultation that is cleared with cough.  Repeat exam with lungs clear to auscultation bilaterally  Musculoskeletal: Normal range of motion.  Neurological: He is alert and oriented to person, place, and time.  Skin: Skin is warm and dry. No rash noted. He is not diaphoretic.  Psychiatric: He has a normal mood and affect. His behavior is normal.    ED Course  Procedures (including critical care time)  Labs Reviewed - No data to display Dg Chest [facility] 1 View  07/23/2012   *RADIOLOGY REPORT*  Clinical Data: Upper respiratory infection,  cough, chest tightness and shortness of breath  PORTABLE CHEST - 1 VIEW  Comparison: Chest x-ray of 06/04/2012  Findings: No active infiltrate or effusion is seen.  Tracheostomy appears to be in good position.  The heart is within upper limits of normal.  No acute bony abnormality is seen.  Surgical clips overlie the right lower neck.  IMPRESSION: No active lung disease.  Tracheostomy remains and appears to be in good position.   Original Report Authenticated By: Dwyane Dee, M.D.     1. Bronchitis   2. Tracheostomy in place      MDM he   Pt CXR negative for acute infiltrate. Patients symptoms are consistent Bronchitis likely viral etiology. Pt will be discharged with symptomatic treatment.  Verbalizes understanding and is agreeable with plan. Pt is hemodynamically stable & in NAD prior to dc.         Jaci Carrel, [place] 07/23/12 1131

## 2012-08-30 ENCOUNTER — Other Ambulatory Visit: Payer: Self-pay | Admitting: Internal Medicine

## 2012-09-02 NOTE — Telephone Encounter (Signed)
Faxed hardcopy to [employer] [facility] [place]

## 2012-09-02 NOTE — Telephone Encounter (Signed)
Done hardcopy to robin  

## 2012-09-16 ENCOUNTER — Telehealth (INDEPENDENT_AMBULATORY_CARE_PROVIDER_SITE_OTHER): Payer: Self-pay

## 2012-09-16 NOTE — Telephone Encounter (Signed)
Pt called stating his peg tube is still functioning but is showing a lot of wear and pt concerned it will need to be replaced soon. Pt offered appt for 09-17-12 but declined due to other appts. Pt given appt for 09-24-12. Pt to call if tube stops working.

## 2012-09-18 ENCOUNTER — Telehealth (INDEPENDENT_AMBULATORY_CARE_PROVIDER_SITE_OTHER): Payer: Self-pay | Admitting: *Deleted

## 2012-09-18 NOTE — Telephone Encounter (Signed)
Patient called in this morning to cancel his August 12th appt to check his peg tube.  Explained to patient that this is an important appt and this RN doesn't feel like he should cancel it however patient states he has other appts that are more important that day.  Offered patient next available which was August 22nd at 250p however patient states he can't leave his house at that time due to the sunlight in the afternoon.  Patient states it is dangerous for him to be exposed to sunlight and the afternoon has the most.  Patient then offered the August 27th at 950a appt which patient accepted.  This RN again advised patient that this appt seems too far out and again asked patient not to cancel his appt next week however patient again refused and stated "I'll go with the 27th morning appt".  This appt was made for patient at this time per his request.

## 2012-09-24 ENCOUNTER — Encounter (INDEPENDENT_AMBULATORY_CARE_PROVIDER_SITE_OTHER): Payer: Medicare Other | Admitting: General Surgery

## 2012-10-09 ENCOUNTER — Encounter (INDEPENDENT_AMBULATORY_CARE_PROVIDER_SITE_OTHER): Payer: Medicare Other | Admitting: General Surgery

## 2012-10-24 ENCOUNTER — Other Ambulatory Visit: Payer: Self-pay | Admitting: Internal Medicine

## 2012-10-30 ENCOUNTER — Encounter (INDEPENDENT_AMBULATORY_CARE_PROVIDER_SITE_OTHER): Payer: Medicare Other | Admitting: General Surgery

## 2012-11-04 ENCOUNTER — Encounter (INDEPENDENT_AMBULATORY_CARE_PROVIDER_SITE_OTHER): Payer: Self-pay | Admitting: General Surgery

## 2012-11-04 ENCOUNTER — Ambulatory Visit (INDEPENDENT_AMBULATORY_CARE_PROVIDER_SITE_OTHER): Payer: Medicare Other | Admitting: General Surgery

## 2012-11-04 VITALS — BP 122/70 | HR 82 | Resp 16 | Ht 69.0 in | Wt 162.6 lb

## 2012-11-04 DIAGNOSIS — K9423 Gastrostomy malfunction: Secondary | ICD-10-CM

## 2012-11-04 NOTE — Progress Notes (Signed)
Subjective:     Patient ID: Nicholas Cobb, male   DOB: 10-Oct-1950, 62 y.o.   MRN: [id number]  HPIhe is here because his long term indwelling gastrostomy tube is starting to function poorly. He has a 84 [place] gastrostomy tube in.   Review of Systemshe currently has an upper respiratory infection the     Objective:   Physical Exam Gen. E.-he is in no acute distress.  Abdomen-soft, gastrostomy tube in left upper quadrant. The old tube was removed and a new tube was placed. The balloon was blown up to 10 cc. A dry dressing was applied.    Assessment:     Gastrostomy tube dysfunction-tube was replaced.    Plan:     Return visit when he needs tube replaced.

## 2012-11-04 NOTE — Patient Instructions (Addendum)
Call if the tube starts to dysfunction.

## 2012-11-25 ENCOUNTER — Telehealth: Payer: Self-pay | Admitting: Internal Medicine

## 2012-11-25 MED ORDER — HYDROCODONE-ACETAMINOPHEN 10-325 MG PO TABS: 1.0000 | ORAL_TABLET | Freq: Three times a day (TID) | ORAL | Status: DC | PRN

## 2012-11-25 NOTE — Telephone Encounter (Signed)
Done hardcopy to robin  Please let pt know about new rules for hydrocodone, and 3 mo total rx given today  Please note pt does not speak well as he has had ENT cancer and tx

## 2012-11-25 NOTE — Telephone Encounter (Signed)
Pt request written rx for hydrocodone. Pt will be out in 2 days please advise.

## 2012-11-26 NOTE — Telephone Encounter (Signed)
Called the patient left a message that hardcopy's are ready for pickup at the front desk.

## 2012-11-29 ENCOUNTER — Ambulatory Visit: Payer: Medicare Other | Admitting: Internal Medicine

## 2012-12-03 ENCOUNTER — Encounter: Payer: Self-pay | Admitting: Internal Medicine

## 2012-12-03 ENCOUNTER — Ambulatory Visit (INDEPENDENT_AMBULATORY_CARE_PROVIDER_SITE_OTHER): Payer: Medicare Other | Admitting: Internal Medicine

## 2012-12-03 ENCOUNTER — Other Ambulatory Visit (INDEPENDENT_AMBULATORY_CARE_PROVIDER_SITE_OTHER): Payer: Medicare Other

## 2012-12-03 VITALS — BP 144/90 | HR 77 | Temp 99.9°F | Wt 169.1 lb

## 2012-12-03 DIAGNOSIS — R7309 Other abnormal glucose: Secondary | ICD-10-CM

## 2012-12-03 DIAGNOSIS — R7302 Impaired glucose tolerance (oral): Secondary | ICD-10-CM

## 2012-12-03 DIAGNOSIS — I10 Essential (primary) hypertension: Secondary | ICD-10-CM

## 2012-12-03 DIAGNOSIS — Z0000 Encounter for general adult medical examination without abnormal findings: Secondary | ICD-10-CM

## 2012-12-03 DIAGNOSIS — E871 Hypo-osmolality and hyponatremia: Secondary | ICD-10-CM | POA: Insufficient documentation

## 2012-12-03 HISTORY — DX: Essential (primary) hypertension: I10

## 2012-12-03 LAB — URINALYSIS, ROUTINE W REFLEX MICROSCOPIC
Bilirubin Urine: NEGATIVE
Ketones, ur: NEGATIVE
Nitrite: NEGATIVE
RBC / HPF: NONE SEEN (ref 0–?)
Specific Gravity, Urine: 1.015 (ref 1.000–1.030)
Total Protein, Urine: NEGATIVE
Urine Glucose: NEGATIVE
pH: 7 (ref 5.0–8.0)

## 2012-12-03 LAB — LIPID PANEL
Cholesterol: 162 mg/dL (ref 0–200)
HDL: 39.7 mg/dL (ref >39.00)
LDL Cholesterol: 90 mg/dL (ref 0–99)
Total CHOL/HDL Ratio: 4
Triglycerides: 160 mg/dL — ABNORMAL HIGH (ref 0.0–149.0)
VLDL: 32 mg/dL (ref 0.0–40.0)

## 2012-12-03 LAB — BASIC METABOLIC PANEL
BUN: 17 mg/dL (ref 6–23)
Calcium: 9.5 mg/dL (ref 8.4–10.5)
Chloride: 95 mEq/L — ABNORMAL LOW (ref 96–112)
Creatinine, Ser: 0.9 mg/dL (ref 0.4–1.5)
GFR: 103.66 mL/min (ref >60.00)
Potassium: 4.7 mEq/L (ref 3.5–5.1)
Sodium: 134 mEq/L — ABNORMAL LOW (ref 135–145)

## 2012-12-03 LAB — CBC WITH DIFFERENTIAL/PLATELET
Basophils Relative: 0.4 % (ref 0.0–3.0)
Eosinophils Relative: 2.1 % (ref 0.0–5.0)
HCT: 41.9 % (ref 39.0–52.0)
Hemoglobin: 14.1 g/dL (ref 13.0–17.0)
Lymphs Abs: 0.8 10*3/uL (ref 0.7–4.0)
MCHC: 33.5 g/dL (ref 30.0–36.0)
MCV: 89.8 fl (ref 78.0–100.0)
Monocytes Absolute: 1 10*3/uL (ref 0.1–1.0)
Monocytes Relative: 14.4 % — ABNORMAL HIGH (ref 3.0–12.0)
Platelets: 242 10*3/uL (ref 150.0–400.0)
RBC: 4.66 Mil/uL (ref 4.22–5.81)
WBC: 6.7 10*3/uL (ref 4.5–10.5)

## 2012-12-03 LAB — HEPATIC FUNCTION PANEL
AST: 29 U/L (ref 0–37)
Total Bilirubin: 0.6 mg/dL (ref 0.3–1.2)

## 2012-12-03 LAB — PSA: PSA: 0.53 ng/mL (ref 0.10–4.00)

## 2012-12-03 MED ORDER — AMLODIPINE BESYLATE 2.5 MG PO TABS: 2.5000 mg | ORAL_TABLET | Freq: Every day | ORAL | Status: DC

## 2012-12-03 NOTE — Assessment & Plan Note (Signed)

## 2012-12-03 NOTE — Assessment & Plan Note (Signed)
For low dose amlod 2.5 mg, f/u next visit and with BP at home

## 2012-12-03 NOTE — Assessment & Plan Note (Signed)
Chronic mild persistent recent, for f/u today

## 2012-12-03 NOTE — Patient Instructions (Signed)
Please take all new medication as prescribed - amlodipine 2.5 mg per day for blood pressure Please continue all other medications as before, and refills have been done if requested. Please have the pharmacy call with any other refills you may need. Please continue your efforts at being more active, low cholesterol diet, and weight control. You are otherwise up to date with prevention measures today. Please keep your appointments with your specialists as you have planned [facility] E[facility] Please go to the LAB in the Basement (turn left off the elevator) for the tests to be done today You will be contacted by phone if any changes need to be made immediately.  Otherwise, you will receive a letter about your results with an explanation, but please check with MyChart first.  Please remember to sign up for My Chart if you have not done so, as this will be important to you in the future with finding out test results, communicating by private email, and scheduling acute appointments online when needed.  Please return in 6 months, or sooner if needed

## 2012-12-03 NOTE — Progress Notes (Signed)
Subjective:    Patient ID: Nicholas Cobb, male    DOB: Nov 26, 1950, 62 y.o.   MRN: [id number]  HPI  Here for wellness and f/u; is difficult to understand due to prior ENT surgury but Overall doing ok;  Pt denies CP, worsening SOB, DOE, wheezing, orthopnea, PND, worsening LE edema, palpitations, dizziness or syncope.  Pt denies neurological change such as new headache, facial or extremity weakness.  Pt denies polydipsia, polyuria, or low sugar symptoms. Pt states overall good compliance with treatment and medications, good tolerability, and has been trying to follow lower cholesterol diet.  Pt denies worsening depressive symptoms, suicidal ideation or panic. No fever, night sweats, wt loss, loss of appetite, or other constitutional symptoms.  Pt states good ability with ADL's, has low fall risk, home safety reviewed and adequate, no other significant changes in hearing or vision, and only occasionally active with exercise.  Due for refills today.  No acute complaints except notes his BP has been higher recently in the past 6 -12 mo consistently, new for him. Past Medical History  Diagnosis Date  . Hearing loss   . Change in voice   . Rash   . Trouble swallowing   . Difficulty urinating   . Cervical spondylosis 03/25/2011  . Emphysema 03/25/2011  . GERD (gastroesophageal reflux disease) 03/25/2011  . Chronic pain 03/25/2011  . 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 Xerostomia 03/25/2011  . Ulcer   . UTI (lower urinary tract infection)   . Allergy   . Chronic sinusitis 04/27/2011  . Impaired glucose tolerance 04/27/2011  . Cervical spondylolysis 04/30/2011    severe  . Cancer 1999    throat cancer   Past Surgical History  Procedure Laterality Date  . Peg tube placement  11/1999  . Tracheostomy  11/1999  . Gastrostomy tube placement      reports that he quit smoking about 16 years ago. He has never used smokeless tobacco. He reports that he does not drink alcohol or use illicit drugs. family  history includes Cancer in his other; Diabetes in his other; Stroke in his other. Allergies  Allergen Reactions  . Codeine Nausea And Vomiting and Rash   . Current Outpatient Prescriptions on File Prior to Visit  Medication Sig Dispense Refill  . CIPRODEX otic suspension       . fluticasone (FLONASE) 50 MCG/ACT nasal spray Place into the nose. Place 2 sprays into both nostrils daily.      Marland Kitchen HYDROcodone-acetaminophen (NORCO) 10-325 MG per tablet Take 1 tablet by mouth 3 (three) times daily as needed for pain. To fill Jan 25, 2013  90 tablet  0  . ibuprofen (ADVIL,MOTRIN) 400 MG tablet take 1 tablet by mouth three times a day if needed  90 tablet  7  . Limonene LIQD Apply to eye. Apply to eye. Place 2 drops into both eyes daily as needed (clear eyes). Murine eye drops      . OVER THE COUNTER MEDICATION Place 2 drops into both eyes daily as needed (clear eyes). Murine eye drops      . oxymetazoline (12 HOUR NASAL SPRAY) 0.05 % nasal spray Place into the nose. Place 2 sprays into both nostrils 2 (two) times daily as needed for Congestion.      . ranitidine (ZANTAC) 150 MG tablet Take 1 tablet (150 mg total) by mouth 2 (two) times daily.  180 tablet  3   No current facility-administered medications on file prior to visit.  Review of Systems Constitutional: Negative for diaphoresis, activity change, appetite change or unexpected weight change.  HENT: Negative for hearing loss, ear pain, facial swelling, mouth sores and neck stiffness.   Eyes: Negative for pain, redness and visual disturbance.  Respiratory: Negative for shortness of breath and wheezing.   Cardiovascular: Negative for chest pain and palpitations.  Gastrointestinal: Negative for diarrhea, blood in stool, abdominal distention or other pain Genitourinary: Negative for hematuria, flank pain or change in urine volume.  Musculoskeletal: Negative for myalgias and joint swelling.  Skin: Negative for color change and wound.   Neurological: Negative for syncope and numbness. other than noted Hematological: Negative for adenopathy.  Psychiatric/Behavioral: Negative for hallucinations, self-injury, decreased concentration and agitation.      Objective:   Physical Exam BP 144/90  Pulse 77  Temp(Src) 99.9 F (37.7 C) (Oral)  Wt 169 lb 2 oz (76.715 kg)  BMI 24.96 kg/m2  SpO2 94% VS noted,  Constitutional: Pt is oriented to person, place, and time. Appears well-developed and well-nourished.  Head: Normocephalic and atraumatic.  Right Ear: External ear normal.  Left Ear: External ear normal. Nose: Nose normal.  Mouth/Throat: Oropharynx unable to visualize, s/p ent surgury Eyes: Conjunctivae and EOM are normal. Pupils are equal, round, and reactive to light.  Neck:  No JVD present. No tracheal deviation present.  Cardiovascular: Normal rate, regular rhythm, normal heart sounds and intact distal pulses.   Pulmonary/Chest: Effort normal and breath sounds normal.  Abdominal: Soft. Bowel sounds are normal. There is no tenderness. No HSM  Musculoskeletal: Normal range of motion. Exhibits no edema.  Lymphadenopathy:  Has no cervical adenopathy.  Neurological: Pt is alert and oriented to person, place, and time. Pt has normal reflexes. No cranial nerve deficit.  Skin: Skin is warm and dry. No rash noted.  Psychiatric:  Has  normal mood and affect. Behavior is normal.     Assessment & Plan:

## 2012-12-03 NOTE — Assessment & Plan Note (Signed)
Asymtp, for a1c today 

## 2012-12-05 ENCOUNTER — Telehealth: Payer: Self-pay | Admitting: Internal Medicine

## 2012-12-05 NOTE — Telephone Encounter (Signed)
Pt called to say the Amlodipine is making him nauseous.

## 2012-12-05 NOTE — Telephone Encounter (Signed)
This would be VERY highly unlikely to be caused by the amlodipine, please continue for now, consider TUMS to see if helps as antacid

## 2012-12-06 ENCOUNTER — Other Ambulatory Visit: Payer: Self-pay | Admitting: Internal Medicine

## 2012-12-06 NOTE — Telephone Encounter (Signed)
Patient informed. 

## 2012-12-17 ENCOUNTER — Telehealth: Payer: Self-pay | Admitting: Internal Medicine

## 2012-12-17 NOTE — Telephone Encounter (Signed)
Pt called request for Dr. Jonny Ruiz to give him something for cold, sore throat. Offer an appt, but pt does not drive, no wait to come in for an appt. Please call pt

## 2012-12-17 NOTE — Telephone Encounter (Signed)
Called the patient left message to call back 

## 2012-12-17 NOTE — Telephone Encounter (Signed)
Called left message to call back 

## 2012-12-17 NOTE — Telephone Encounter (Signed)
Ok for otc lozenges or cloraseptic; consider OV if pain worsens or fever

## 2012-12-18 NOTE — Telephone Encounter (Signed)
Anything for congestion that does not have a decongestant such as sudafed would be fine, such as mucinex DM

## 2012-12-18 NOTE — Telephone Encounter (Signed)
Called left message to call back 

## 2012-12-18 NOTE — Telephone Encounter (Signed)
Called the patient back left a detailed message of MD instructions. 

## 2012-12-18 NOTE — Telephone Encounter (Signed)
Called the patient and he states he does not have a sore throat, but a cold.  Please advise on what to take OTC.

## 2012-12-25 ENCOUNTER — Telehealth: Payer: Self-pay | Admitting: Internal Medicine

## 2012-12-25 NOTE — Telephone Encounter (Signed)
Pt request refill for hydrocodone. Please call pt °

## 2012-12-25 NOTE — Telephone Encounter (Signed)
Pt should already have rx for nov and dec  Please advise pt he likely has these in his possession, prob does not need refills  Also - robin to let pt know:  You are mailed the letter today explaining the transitional pain medication refill policy  Please be aware that I will no longer be able to offer monthly refills of any Schedule II or higher medication starting Mar 16, 2013

## 2012-12-25 NOTE — Telephone Encounter (Signed)
Called the patient left a detailed message of MD instructions on refill and pain medication policy change.  Informed the patient to call back

## 2012-12-26 NOTE — Telephone Encounter (Signed)
Letter was mailed on 12/25/12 explaining change in pain medication refill policy per MD.  Nicholas Cobb the patient to inform of all information left message to call back

## 2012-12-26 NOTE — Telephone Encounter (Signed)
noted 

## 2012-12-26 NOTE — Telephone Encounter (Signed)
Called the patient Explained in detail all information. The patient did not seem to understand and was having difficulty with  Communicating his response back to me.   I clearly stated November and December refills should be in his possession as MD stated and did explain clearly letter in the mail (explained contents of letter as well).  I suggested that he make an appointment with Dr. Jonny Ruiz to have MD explain all information.

## 2012-12-28 ENCOUNTER — Emergency Department (INDEPENDENT_AMBULATORY_CARE_PROVIDER_SITE_OTHER): Payer: Medicare Other

## 2012-12-28 ENCOUNTER — Encounter (HOSPITAL_COMMUNITY): Payer: Self-pay | Admitting: Emergency Medicine

## 2012-12-28 ENCOUNTER — Emergency Department (INDEPENDENT_AMBULATORY_CARE_PROVIDER_SITE_OTHER)
Admission: EM | Admit: 2012-12-28 | Discharge: 2012-12-28 | Disposition: A | Discharge status: Home / Self Care | Payer: Medicare Other | Source: Home / Self Care | Attending: Family Medicine | Admitting: Family Medicine

## 2012-12-28 DIAGNOSIS — J441 Chronic obstructive pulmonary disease with (acute) exacerbation: Secondary | ICD-10-CM

## 2012-12-28 MED ORDER — IPRATROPIUM BROMIDE 0.02 % IN SOLN
0.5000 mg | Freq: Once | RESPIRATORY_TRACT | Status: AC
  Administered 2012-12-28: 0.5 mg via RESPIRATORY_TRACT

## 2012-12-28 MED ORDER — HYDROCODONE-ACETAMINOPHEN 5-325 MG PO TABS: 0.5000 | ORAL_TABLET | Freq: Every evening | ORAL | Status: DC | PRN

## 2012-12-28 MED ORDER — SODIUM CHLORIDE 0.9 % IN NEBU
INHALATION_SOLUTION | RESPIRATORY_TRACT | Status: AC
  Filled 2012-12-28: qty 6

## 2012-12-28 MED ORDER — PREDNISOLONE SODIUM PHOSPHATE 15 MG/5ML PO SOLN: 40.0000 mg | Freq: Every day | ORAL | Status: AC

## 2012-12-28 MED ORDER — AZITHROMYCIN 200 MG/5ML PO SUSR: 500.0000 mg | Freq: Every day | ORAL | Status: DC

## 2012-12-28 MED ORDER — IPRATROPIUM BROMIDE 0.02 % IN SOLN
RESPIRATORY_TRACT | Status: AC
  Filled 2012-12-28: qty 2.5

## 2012-12-28 MED ORDER — ALBUTEROL SULFATE (5 MG/ML) 0.5% IN NEBU
INHALATION_SOLUTION | RESPIRATORY_TRACT | Status: AC
  Filled 2012-12-28: qty 1

## 2012-12-28 MED ORDER — ALBUTEROL SULFATE (5 MG/ML) 0.5% IN NEBU
5.0000 mg | INHALATION_SOLUTION | Freq: Once | RESPIRATORY_TRACT | Status: AC
  Administered 2012-12-28: 5 mg via RESPIRATORY_TRACT

## 2012-12-28 NOTE — ED Notes (Signed)
Pt c/o cold sxs onset 1 week Sxs include: cough w/yellow phlegm, chest discomfort due to cough, SOB, wheezing,  Denies: f/v/d Difficult to understand due to tracheostoma in place  Alert w/no signs of acute distress.

## 2012-12-28 NOTE — ED Provider Notes (Signed)
Nicholas Cobb is a 62 y.o. male who presents to Urgent Care today for productive cough and wheezing with shortness of breath present for one week. Patient has a past medical history significant for throat cancer with large surgery resulting in trach and PEG tube.  He uses a speech valve to communicate.  He denies any fevers or chills nausea vomiting or diarrhea. He has not tried any medications yet.   Past Medical History  Diagnosis Date  . Hearing loss   . Change in voice   . Rash   . Trouble swallowing   . Difficulty urinating   . Cervical spondylosis 03/25/2011  . Emphysema 03/25/2011  . GERD (gastroesophageal reflux disease) 03/25/2011  . Chronic pain 03/25/2011  . 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 Xerostomia 03/25/2011  . Ulcer   . UTI (lower urinary tract infection)   . Allergy   . Chronic sinusitis 04/27/2011  . Impaired glucose tolerance 04/27/2011  . Cervical spondylolysis 04/30/2011    severe  . Cancer 1999    throat cancer  . Essential hypertension, benign 12/03/2012   History  Substance Use Topics  . Smoking status: Former Smoker    Quit date: 11/06/1996  . Smokeless tobacco: Never Used  . Alcohol Use: No   ROS as above Medications reviewed. No current facility-administered medications for this encounter.   Current Outpatient Prescriptions  Medication Sig Dispense Refill  . ranitidine (ZANTAC) 150 MG tablet take 1 tablet by mouth twice a day  180 tablet  3  . amLODipine (NORVASC) 2.5 MG tablet Take 1 tablet (2.5 mg total) by mouth daily.  90 tablet  3  . azithromycin (ZITHROMAX) 200 MG/5ML suspension Place 12.5 mLs (500 mg total) into feeding tube daily. 3 days disp qs  60 mL  0  . CIPRODEX otic suspension       . fluticasone (FLONASE) 50 MCG/ACT nasal spray Place into the nose. Place 2 sprays into both nostrils daily.      Marland Kitchen HYDROcodone-acetaminophen (NORCO) 10-325 MG per tablet Take 1 tablet by mouth 3 (three) times daily as needed for pain. To fill Jan 25, 2013   90 tablet  0  . HYDROcodone-acetaminophen (NORCO/VICODIN) 5-325 MG per tablet Place 0.5 tablets into feeding tube at bedtime as needed (cough).  6 tablet  0  . ibuprofen (ADVIL,MOTRIN) 400 MG tablet take 1 tablet by mouth three times a day if needed  90 tablet  7  . Limonene LIQD Apply to eye. Apply to eye. Place 2 drops into both eyes daily as needed (clear eyes). Murine eye drops      . OVER THE COUNTER MEDICATION Place 2 drops into both eyes daily as needed (clear eyes). Murine eye drops      . oxymetazoline (12 HOUR NASAL SPRAY) 0.05 % nasal spray Place into the nose. Place 2 sprays into both nostrils 2 (two) times daily as needed for Congestion.      . prednisoLONE (ORAPRED) 15 MG/5ML solution Place 13.3 mLs (40 mg total) into feeding tube daily before breakfast. 5 days  240 mL  0    Exam:  BP 131/78  Pulse 83  Temp(Src) 97.9 F (36.6 C) (Oral)  Resp 16  SpO2 98% Gen: Well NAD HEENT: Trach intact.  Mild facial swelling. Well-appearing scars. Lungs: CTABL Nl WOB Heart: RRR no MRG Abd: NABS, NT, ND Exts: Non edematous BL  LE, warm and well perfused.   Patient was given DuoNeb treatment and had considerable symptom relief  No results found for this or any previous visit (from the past 24 hour(s)). Dg Chest 2 View  12/28/2012   CLINICAL DATA:  Upper respiratory symptoms, tracheostomy  EXAM: CHEST  2 VIEW  COMPARISON:  07/23/2012  FINDINGS: Tracheostomy tip 4.3 cm above the carina. Ill-defined streaky bibasilar densities with central bronchial thickening. Basilar bronchopneumonia not excluded. No effusion or pneumothorax. No significant collapse or consolidation. Negative for edema. Atherosclerosis of the aorta. No pneumothorax.  IMPRESSION: Ill-defined streaky bibasilar opacities with bronchial thickening, bronchopneumonia not excluded.   Electronically Signed   By: Ruel Favors M.D.   On: 12/28/2012 13:46    Assessment and Plan: 62 y.o. male with COPD exacerbation complicated by  trach status and PEG tube status. Plan to treat with prednisone, azithromycin, and cough medication. Followup with primary care provider. Discussed warning signs or symptoms. Please see discharge instructions. Patient expresses understanding.      Rodolph Bong, MD 12/28/12 [phone]

## 2013-02-24 ENCOUNTER — Telehealth: Payer: Self-pay | Admitting: *Deleted

## 2013-02-24 MED ORDER — HYDROCODONE-ACETAMINOPHEN 10-325 MG PO TABS: 1.0000 | ORAL_TABLET | Freq: Three times a day (TID) | ORAL | Status: DC | PRN

## 2013-02-24 NOTE — Telephone Encounter (Signed)
Patient phoned requesting refill hydrocodone refill.  Last OV was 12/03/12.  Please advise.  CB# [phone]

## 2013-02-24 NOTE — Telephone Encounter (Signed)
[place] hardcopy to robin  Please remind pt he was notified by letter in Nov 2014 that i will no longer be able to write prescriptions for the Schedule II narcotics such as hydrocodone or oxycodone on a regular monthly basis  He will need to find a new MD, or I can try to refer to pain management (though there is no guarantee he would be accepted as a pt)

## 2013-02-25 NOTE — Telephone Encounter (Signed)
Called the patient informed hardcopy is ready for pickup at the front desk. 

## 2013-03-11 ENCOUNTER — Telehealth: Payer: Self-pay | Admitting: Internal Medicine

## 2013-03-11 NOTE — Telephone Encounter (Signed)
Patient Information:  Caller Name: Eusebio  Phone: [phone]  Patient: Nicholas, Cobb  Gender: Male  DOB: May 08, 1950  Age: 63 Years  PCP: Cathlean Cower (Adults only)  Office Follow Up:  Does the office need to follow up with this patient?: No  Instructions For The Office: N/A  RN Note:  He is checking with daughter to see when she can bring him in to be checked.  Symptoms  Reason For Call & Symptoms: Started with increased cough and congestion onset 03/06/13 and is having chest pain with coughing. He has Tracheostomy and is having to clean it more frequently for dark brownish yellowish thick mucos. Has wheezing that is chronic d/t trach but clears some after coughing. Afebrile.   Reviewed Health History In EMR: Yes  Reviewed Medications In EMR: Yes  Reviewed Allergies In EMR: Yes  Reviewed Surgeries / Procedures: Yes  Date of Onset of Symptoms: 03/06/2013  Guideline(s) Used:  Cough  Disposition Per Guideline:   See Today in Office  Reason For Disposition Reached:   Severe coughing spells (e.g., whooping sound after coughing, vomiting after coughing)  Advice Given:  Reassurance  Coughing is the way that our lungs remove irritants and mucus. It helps protect our lungs from getting pneumonia.  You can get a dry hacking cough after a chest cold. Sometimes this type of cough can last 1-3 weeks, and be worse at night.  You can also get a cough after being exposed to irritating substances like smoke, strong perfumes, and dust.  Coughing Spasms:  Drink warm fluids. Inhale warm mist (Reason: both relax the airway and loosen up the phlegm).  Suck on cough drops or hard candy to coat the irritated throat.  Prevent Dehydration:  Drink adequate liquids.  This will help soothe an irritated or dry throat and loosen up the phlegm.  Expected Course:   Viral bronchitis (chest cold) causes a cough that lasts 1 to 3 weeks. Sometimes you may cough up lots of phlegm (sputum, mucus). The  mucus can normally be white, gray, yellow, or green.  Call Back If:  Difficulty breathing  Cough lasts more than 3 weeks  Fever lasts > 3 days  You become worse.  Patient Refused Recommendation:  Patient Will Follow Up With Office Later  He will call back after talking to his daughter. He is unsure of her work schedule for 03/13/23. He is not able to come in today 03/11/13.

## 2013-03-25 ENCOUNTER — Telehealth: Payer: Self-pay | Admitting: Internal Medicine

## 2013-03-25 NOTE — Telephone Encounter (Signed)
Patient informed of MD instructions and stated he would discuss at next [place].

## 2013-03-25 NOTE — Telephone Encounter (Signed)
Very sorry, but pt was notified by letter in nov 2014 that I would no longer be able to do this after Mar 16, 2013

## 2013-03-25 NOTE — Telephone Encounter (Signed)
Called the patient left a message to call back. 

## 2013-03-25 NOTE — Telephone Encounter (Signed)
Pt request refill for hydrocodone. Please call pt °

## 2013-03-26 ENCOUNTER — Emergency Department (HOSPITAL_COMMUNITY)
Admission: EM | Admit: 2013-03-26 | Discharge: 2013-03-26 | Disposition: A | Discharge status: Home / Self Care | Payer: Medicare Other | Attending: Emergency Medicine | Admitting: Emergency Medicine

## 2013-03-26 ENCOUNTER — Emergency Department (HOSPITAL_COMMUNITY): Payer: Medicare Other

## 2013-03-26 ENCOUNTER — Encounter (HOSPITAL_COMMUNITY): Payer: Self-pay | Admitting: Emergency Medicine

## 2013-03-26 DIAGNOSIS — Z87891 Personal history of nicotine dependence: Secondary | ICD-10-CM | POA: Insufficient documentation

## 2013-03-26 DIAGNOSIS — Z792 Long term (current) use of antibiotics: Secondary | ICD-10-CM | POA: Insufficient documentation

## 2013-03-26 DIAGNOSIS — Z872 Personal history of diseases of the skin and subcutaneous tissue: Secondary | ICD-10-CM | POA: Insufficient documentation

## 2013-03-26 DIAGNOSIS — Z85819 Personal history of malignant neoplasm of unspecified site of lip, oral cavity, and pharynx: Secondary | ICD-10-CM | POA: Insufficient documentation

## 2013-03-26 DIAGNOSIS — Z8669 Personal history of other diseases of the nervous system and sense organs: Secondary | ICD-10-CM | POA: Insufficient documentation

## 2013-03-26 DIAGNOSIS — Z930 Tracheostomy status: Secondary | ICD-10-CM | POA: Insufficient documentation

## 2013-03-26 DIAGNOSIS — R221 Localized swelling, mass and lump, neck: Secondary | ICD-10-CM

## 2013-03-26 DIAGNOSIS — K219 Gastro-esophageal reflux disease without esophagitis: Secondary | ICD-10-CM | POA: Insufficient documentation

## 2013-03-26 DIAGNOSIS — J0410 Acute tracheitis without obstruction: Secondary | ICD-10-CM | POA: Insufficient documentation

## 2013-03-26 DIAGNOSIS — M47812 Spondylosis without myelopathy or radiculopathy, cervical region: Secondary | ICD-10-CM | POA: Insufficient documentation

## 2013-03-26 DIAGNOSIS — J438 Other emphysema: Secondary | ICD-10-CM | POA: Insufficient documentation

## 2013-03-26 DIAGNOSIS — Z79899 Other long term (current) drug therapy: Secondary | ICD-10-CM | POA: Insufficient documentation

## 2013-03-26 DIAGNOSIS — I10 Essential (primary) hypertension: Secondary | ICD-10-CM | POA: Insufficient documentation

## 2013-03-26 DIAGNOSIS — R220 Localized swelling, mass and lump, head: Secondary | ICD-10-CM | POA: Insufficient documentation

## 2013-03-26 DIAGNOSIS — G8929 Other chronic pain: Secondary | ICD-10-CM | POA: Insufficient documentation

## 2013-03-26 DIAGNOSIS — Z87440 Personal history of urinary (tract) infections: Secondary | ICD-10-CM | POA: Insufficient documentation

## 2013-03-26 LAB — CBC WITH DIFFERENTIAL/PLATELET
BASOS ABS: 0 10*3/uL (ref 0.0–0.1)
Basophils Relative: 0 % (ref 0–1)
Eosinophils Absolute: 0.1 10*3/uL (ref 0.0–0.7)
Eosinophils Relative: 2 % (ref 0–5)
HCT: 44.5 % (ref 39.0–52.0)
Hemoglobin: 15.3 g/dL (ref 13.0–17.0)
LYMPHS ABS: 1.3 10*3/uL (ref 0.7–4.0)
LYMPHS PCT: 18 % (ref 12–46)
MCH: 31.2 pg (ref 26.0–34.0)
MCHC: 34.4 g/dL (ref 30.0–36.0)
MCV: 90.8 fL (ref 78.0–100.0)
MONO ABS: 1 10*3/uL (ref 0.1–1.0)
Monocytes Relative: 14 % — ABNORMAL HIGH (ref 3–12)
NEUTROS ABS: 4.8 10*3/uL (ref 1.7–7.7)
Neutrophils Relative %: 66 % (ref 43–77)
Platelets: 296 10*3/uL (ref 150–400)
RBC: 4.9 MIL/uL (ref 4.22–5.81)
RDW: 12.9 % (ref 11.5–15.5)
WBC: 7.3 10*3/uL (ref 4.0–10.5)

## 2013-03-26 LAB — BASIC METABOLIC PANEL
BUN: 16 mg/dL (ref 6–23)
CHLORIDE: 94 meq/L — AB (ref 96–112)
CO2: 29 meq/L (ref 19–32)
Calcium: 9.9 mg/dL (ref 8.4–10.5)
Creatinine, Ser: 0.86 mg/dL (ref 0.50–1.35)
GFR calc Af Amer: >90 mL/min (ref >90)
GFR calc non Af Amer: 89 mL/min — ABNORMAL LOW (ref >90)
GLUCOSE: 117 mg/dL — AB (ref 70–99)
Potassium: 4.8 mEq/L (ref 3.7–5.3)
SODIUM: 137 meq/L (ref 137–147)

## 2013-03-26 MED ORDER — IPRATROPIUM-ALBUTEROL 0.5-2.5 (3) MG/3ML IN SOLN: 3.0000 mL | Freq: Once | RESPIRATORY_TRACT | Status: DC

## 2013-03-26 MED ORDER — AMOXICILLIN-POT CLAVULANATE 250-62.5 MG/5ML PO SUSR: 500.0000 mg | Freq: Three times a day (TID) | ORAL | Status: DC

## 2013-03-26 NOTE — ED Provider Notes (Signed)
Medical screening examination/treatment/procedure(s) were performed by non-physician practitioner and as supervising physician I was immediately available for consultation/collaboration.  EKG Interpretation   None      Patient seen and has chronic facial swelling from prior region treatment as well as increased secretions from his tracheostomy site. Chest x-ray shows chronic changes. Suspect that patient might have tracheal infection were placed on Augmentin for this.  Leota Jacobsen, MD 03/26/13 [phone]

## 2013-03-26 NOTE — ED Notes (Signed)
Dr Allen at bedside  

## 2013-03-26 NOTE — ED Notes (Signed)
Pt requesting that RT suction his trach. RT made aware of pt's request.

## 2013-03-26 NOTE — ED Notes (Signed)
Pt with history of throat cancer; c/o difficulty breathing and sts this has been present for some time.  Last radiation treatment in 2001.

## 2013-03-26 NOTE — ED Notes (Signed)
PA at bedside to assist with discharge.  Pt with multiple questions regarding medication and discharge instructions.

## 2013-03-26 NOTE — ED Notes (Signed)
Pt pacing in room inquiring about status of care.  Pt informed that we were waiting on BMET to result.

## 2013-03-26 NOTE — ED Notes (Signed)
RT at bedside suctioning pt.

## 2013-03-26 NOTE — ED Notes (Addendum)
1510- respiratory called to assess pt and lungs clear trach intact. Pt does have facial swelling. Pt complaining of nasal congestion. No respiratory distress noted. sats WNL. RR 24

## 2013-03-26 NOTE — ED Notes (Signed)
Pt refused duoneb treatment with RT.  Stating "I do not need a treatment but suctioning".

## 2013-03-26 NOTE — ED Notes (Signed)
Pt arrives with trach and passy muir valve stating he has facial swelling and SOB. Pt difficult to understand.  Pt with labored resp. Resp paged.

## 2013-03-26 NOTE — ED Notes (Signed)
This RN talked pt into allowing portable chest to be completed.  Pt more concerned about facial swelling that has been present since radiation treatments in 2011.  This RN informed pt that due to pt c/o shortness of breath that it was necessary for chest xray to be completed for treatment.  Pt allowed chest xray to be completed.

## 2013-03-26 NOTE — Discharge Instructions (Signed)
Follow up with your primary care doctor in 24-48 hours. Take medication as directed. Return to ED should you have symptoms of fever/chills, shortness of breath or chest pain.

## 2013-03-26 NOTE — ED Notes (Signed)
Pt verbalized understanding of discharge instructions and follow up.

## 2013-03-27 MED ORDER — AMOXICILLIN-POT CLAVULANATE 400-57 MG/5ML PO SUSR: 400.0000 mg | Freq: Three times a day (TID) | ORAL | Status: AC

## 2013-03-27 NOTE — Progress Notes (Signed)
ED CM received incoming call from [place] concerning dosage clarification of Augmentin. [place] pharmacist requesting does be changed to dosage that would be covered under patient's insurance. Augmentin 400-57mg /28ml suspension 32mls 3 times daily. Spoke with Antony Blackbird PA-C prescription printed. ED CM faxed to [facility] on [place] fax Confirmation received. No further CM needs identified

## 2013-04-03 NOTE — ED Provider Notes (Signed)
CSN: [id number]     Arrival date & time 03/26/13  1448 History   First MD Initiated Contact with Patient 03/26/13 1658     Chief Complaint  Patient presents with  . Shortness of Breath  . Facial Swelling     (Consider location/radiation/quality/duration/timing/severity/associated sxs/prior Treatment) Patient is a 64 y.o. male presenting with shortness of breath.  Shortness of Breath Associated symptoms: no chest pain and no fever    63 yo male presents to ED with chronic worsening SOB and Facial swelling. Patient has a tracheostomy. Patient difficult to understand. Patient states his PCP Dr. Cathlean Cower ordered patient to come to ED for suctioning of his trach. Patient states he has a hx of SOB but has recently been worsening due to worsening secretions from tracheostomy site. Hx limited secondary to communication barrier. Patient gets frustrated with writing and appears agitated.   PMH significant for Throat Cancer, Emphysema, and HTN.   Past Medical History  Diagnosis Date  . Hearing loss   . Change in voice   . Rash   . Trouble swallowing   . Difficulty urinating   . Cervical spondylosis 03/25/2011  . Emphysema 03/25/2011  . GERD (gastroesophageal reflux disease) 03/25/2011  . Chronic pain 03/25/2011  . 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 Xerostomia 03/25/2011  . Ulcer   . UTI (lower urinary tract infection)   . Allergy   . Chronic sinusitis 04/27/2011  . Impaired glucose tolerance 04/27/2011  . Cervical spondylolysis 04/30/2011    severe  . Cancer 1999    throat cancer  . Essential hypertension, benign 12/03/2012   Past Surgical History  Procedure Laterality Date  . Peg tube placement  11/1999  . Tracheostomy  11/1999  . Gastrostomy tube placement     Family History  Problem Relation Age of Onset  . Stroke Other   . Diabetes Other   . Cancer Other     lung cancer   History  Substance Use Topics  . Smoking status: Former Smoker    Quit date: 11/06/1996  . Smokeless  tobacco: Never Used  . Alcohol Use: No    Review of Systems  Unable to perform ROS: Other  Constitutional: Negative for fever.  Respiratory: Positive for shortness of breath.   Cardiovascular: Negative for chest pain and leg swelling.      Allergies  Codeine  Home Medications   Current Outpatient Rx  Name  Route  Sig  Dispense  Refill  . ibuprofen (ADVIL,MOTRIN) 400 MG tablet   Oral   Take 400 mg by mouth 3 (three) times daily as needed for moderate pain.         . ranitidine (ZANTAC) 150 MG tablet   Oral   Take 150 mg by mouth 2 (two) times daily.         Marland Kitchen amLODipine (NORVASC) 2.5 MG tablet   Oral   Take 1 tablet (2.5 mg total) by mouth daily.   90 tablet   3   . amoxicillin-clavulanate (AUGMENTIN) 400-57 MG/5ML suspension   Oral   Take 5 mLs (400 mg total) by mouth 3 (three) times daily.   100 mL   0   . HYDROcodone-acetaminophen (NORCO) 10-325 MG per tablet   Oral   Take 1 tablet by mouth 3 (three) times daily as needed. To fill Jan 25, 2013   90 tablet   0   . Limonene LIQD   Ophthalmic   Apply to eye. Apply to eye. Place 2  drops into both eyes daily as needed (clear eyes). Murine eye drops         . OVER THE COUNTER MEDICATION   Both Eyes   Place 2 drops into both eyes daily as needed (clear eyes). Murine eye drops         . oxymetazoline (12 HOUR NASAL SPRAY) 0.05 % nasal spray   Nasal   Place into the nose. Place 2 sprays into both nostrils 2 (two) times daily as needed for Congestion.          BP 167/89  Pulse 85  Temp(Src) 98.8 F (37.1 C) (Oral)  Resp 16  Wt 169 lb 14.4 oz (77.066 kg)  SpO2 96% Physical Exam  Nursing note and vitals reviewed. Constitutional: He is oriented to person, place, and time. He appears well-developed and well-nourished. No distress.  HENT:  Head: Normocephalic and atraumatic.  Neck: No JVD present. No tracheal deviation present.  Cardiovascular: Normal rate and regular rhythm.  Exam reveals no  gallop and no friction rub.   No murmur heard. Pulmonary/Chest: Effort normal. No respiratory distress. He has no wheezes. He has rhonchi. He has no rales.  Musculoskeletal: He exhibits no edema.  Neurological: He is alert and oriented to person, place, and time.  Skin: Skin is warm and dry. He is not diaphoretic.  Psychiatric: He has a normal mood and affect. His behavior is normal.    ED Course  Procedures (including critical care time) Labs Review Labs Reviewed  CBC WITH DIFFERENTIAL - Abnormal; Notable for the following:    Monocytes Relative 14 (*)    All other components within normal limits  BASIC METABOLIC PANEL - Abnormal; Notable for the following:    Chloride 94 (*)    Glucose, Bld 117 (*)    GFR calc non Af Amer 89 (*)    All other components within normal limits   Imaging Review No results found.  EKG Interpretation   None       MDM   Final diagnoses:  Tracheitis   No leukocytosis Patient appears in NAD.  Afebrile, with stable VS.   Plan to have patient follow up with PCP in  24-48 hours. Take medication as directed. Return precautions given for symptoms of fever/chills, shortness of breath or chest pain.   Meds given in ED:  Medications - No data to display  Discharge Medication List as of 03/26/2013  7:43 PM    START taking these medications   Details  amoxicillin-clavulanate (AUGMENTIN) 250-62.5 MG/5ML suspension Place 10 mLs (500 mg total) into feeding tube 3 (three) times daily., Starting 03/26/2013, Last dose on Wed 04/02/13, Print          Dossie Arbour [place], [place] 04/03/13 1931

## 2013-04-06 NOTE — ED Provider Notes (Signed)
Medical screening examination/treatment/procedure(s) were conducted as a shared visit with non-physician practitioner(s) and myself.  I personally evaluated the patient during the encounter.  EKG Interpretation   None        Leota Jacobsen, MD 04/06/13 [phone]

## 2013-05-05 ENCOUNTER — Ambulatory Visit (INDEPENDENT_AMBULATORY_CARE_PROVIDER_SITE_OTHER): Payer: Medicare Other | Admitting: General Surgery

## 2013-05-05 ENCOUNTER — Encounter (INDEPENDENT_AMBULATORY_CARE_PROVIDER_SITE_OTHER): Payer: Self-pay | Admitting: General Surgery

## 2013-05-05 VITALS — BP 140/80 | HR 80 | Temp 98.6°F | Resp 18 | Wt 166.0 lb

## 2013-05-05 DIAGNOSIS — Z431 Encounter for attention to gastrostomy: Secondary | ICD-10-CM

## 2013-05-05 NOTE — Patient Instructions (Signed)
Call when you need the tube replaced.

## 2013-05-05 NOTE — Progress Notes (Signed)
Subjective:     Patient ID: Nicholas Cobb, male   DOB: [birth year], 63 y.o.   MRN: [id number]  HPI  He is here because he is having problems with his gastrostomy tube and feels it needs to be replaced. This is a permanent gastrostomy tube. He has had some episodes of tracheitis since I saw him last.    Review of Systems    Objective:   Physical Exam Gen.-he is in no acute distress.  Abdomen-soft, gastrostomy tube in left upper quadrant. The old tube was removed and a new tube was placed. The balloon was blown up to 10 cc. A dry dressing was applied.    Assessment:     Gastrostomy tube dysfunction-tube was replaced.    Plan:     Return visit when he needs tube replaced.

## 2013-05-17 ENCOUNTER — Emergency Department (HOSPITAL_COMMUNITY)
Admission: EM | Admit: 2013-05-17 | Discharge: 2013-05-17 | Disposition: A | Discharge status: Home / Self Care | Payer: Medicare Other | Attending: Emergency Medicine | Admitting: Emergency Medicine

## 2013-05-17 ENCOUNTER — Emergency Department (HOSPITAL_COMMUNITY): Payer: Medicare Other

## 2013-05-17 ENCOUNTER — Encounter (HOSPITAL_COMMUNITY): Payer: Self-pay | Admitting: Emergency Medicine

## 2013-05-17 DIAGNOSIS — J9509 Other tracheostomy complication: Secondary | ICD-10-CM | POA: Insufficient documentation

## 2013-05-17 DIAGNOSIS — Z79899 Other long term (current) drug therapy: Secondary | ICD-10-CM | POA: Insufficient documentation

## 2013-05-17 DIAGNOSIS — K219 Gastro-esophageal reflux disease without esophagitis: Secondary | ICD-10-CM | POA: Insufficient documentation

## 2013-05-17 DIAGNOSIS — Y833 Surgical operation with formation of external stoma as the cause of abnormal reaction of the patient, or of later complication, without mention of misadventure at the time of the procedure: Secondary | ICD-10-CM | POA: Insufficient documentation

## 2013-05-17 DIAGNOSIS — G8929 Other chronic pain: Secondary | ICD-10-CM | POA: Insufficient documentation

## 2013-05-17 DIAGNOSIS — Z85819 Personal history of malignant neoplasm of unspecified site of lip, oral cavity, and pharynx: Secondary | ICD-10-CM | POA: Insufficient documentation

## 2013-05-17 DIAGNOSIS — Z8739 Personal history of other diseases of the musculoskeletal system and connective tissue: Secondary | ICD-10-CM | POA: Insufficient documentation

## 2013-05-17 DIAGNOSIS — I10 Essential (primary) hypertension: Secondary | ICD-10-CM | POA: Insufficient documentation

## 2013-05-17 DIAGNOSIS — J9500 Unspecified tracheostomy complication: Secondary | ICD-10-CM

## 2013-05-17 DIAGNOSIS — Z87891 Personal history of nicotine dependence: Secondary | ICD-10-CM | POA: Insufficient documentation

## 2013-05-17 DIAGNOSIS — J438 Other emphysema: Secondary | ICD-10-CM | POA: Insufficient documentation

## 2013-05-17 DIAGNOSIS — Z872 Personal history of diseases of the skin and subcutaneous tissue: Secondary | ICD-10-CM | POA: Insufficient documentation

## 2013-05-17 DIAGNOSIS — Z87440 Personal history of urinary (tract) infections: Secondary | ICD-10-CM | POA: Insufficient documentation

## 2013-05-17 LAB — I-STAT CHEM 8, ED
BUN: 13 mg/dL (ref 6–23)
CALCIUM ION: 1.18 mmol/L (ref 1.13–1.30)
CREATININE: 1 mg/dL (ref 0.50–1.35)
Chloride: 95 mEq/L — ABNORMAL LOW (ref 96–112)
GLUCOSE: 111 mg/dL — AB (ref 70–99)
HCT: 48 % (ref 39.0–52.0)
Hemoglobin: 16.3 g/dL (ref 13.0–17.0)
Potassium: 4.5 mEq/L (ref 3.7–5.3)
Sodium: 135 mEq/L — ABNORMAL LOW (ref 137–147)
TCO2: 29 mmol/L (ref 0–100)

## 2013-05-17 LAB — CBC
HEMATOCRIT: 43.7 % (ref 39.0–52.0)
Hemoglobin: 15.3 g/dL (ref 13.0–17.0)
MCH: 31.5 pg (ref 26.0–34.0)
MCHC: 35 g/dL (ref 30.0–36.0)
MCV: 89.9 fL (ref 78.0–100.0)
Platelets: 283 10*3/uL (ref 150–400)
RBC: 4.86 MIL/uL (ref 4.22–5.81)
RDW: 12.6 % (ref 11.5–15.5)
WBC: 6.2 10*3/uL (ref 4.0–10.5)

## 2013-05-17 NOTE — ED Provider Notes (Signed)
CSN: [id number]     Arrival date & time 05/17/13  0052 History   First MD Initiated Contact with Patient 05/17/13 [phone]     Chief Complaint  Patient presents with  . Tracheostomy Tube Change     (Consider location/radiation/quality/duration/timing/severity/associated sxs/prior Treatment) HPI History provided by patient. History of throat cancer and tracheostomy, trach came out during his sleep. Presents with trach halfway out. PT is limited historian due to tracheostomy. No chest pain or difficulty breathing. No fevers or chills. No new cough. Level V caveat applies. Past Medical History  Diagnosis Date  . Hearing loss   . Change in voice   . Rash   . Trouble swallowing   . Difficulty urinating   . Cervical spondylosis 03/25/2011  . Emphysema 03/25/2011  . GERD (gastroesophageal reflux disease) 03/25/2011  . Chronic pain 03/25/2011  . 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 Xerostomia 03/25/2011  . Ulcer   . UTI (lower urinary tract infection)   . Allergy   . Chronic sinusitis 04/27/2011  . Impaired glucose tolerance 04/27/2011  . Cervical spondylolysis 04/30/2011    severe  . Cancer 1999    throat cancer  . Essential hypertension, benign 12/03/2012   Past Surgical History  Procedure Laterality Date  . Peg tube placement  11/1999  . Tracheostomy  11/1999  . Gastrostomy tube placement     Family History  Problem Relation Age of Onset  . Stroke Other   . Diabetes Other   . Cancer Other     lung cancer   History  Substance Use Topics  . Smoking status: Former Smoker    Quit date: 11/06/1996  . Smokeless tobacco: Never Used  . Alcohol Use: No    Review of Systems  Unable to perform ROS  level V caveat as above    Allergies  Codeine  Home Medications   Current Outpatient Rx  Name  Route  Sig  Dispense  Refill  . amLODipine (NORVASC) 2.5 MG tablet   Oral   Take 1 tablet (2.5 mg total) by mouth daily.   90 tablet   3   . ibuprofen (ADVIL,MOTRIN) 400 MG tablet  Oral   Take 400 mg by mouth 3 (three) times daily as needed for moderate pain.         . Limonene LIQD   Ophthalmic   Apply to eye. Apply to eye. Place 2 drops into both eyes daily as needed (clear eyes). Murine eye drops         . OVER THE COUNTER MEDICATION   Both Eyes   Place 2 drops into both eyes daily as needed (clear eyes). Murine eye drops         . oxymetazoline (12 HOUR NASAL SPRAY) 0.05 % nasal spray   Nasal   Place into the nose. Place 2 sprays into both nostrils 2 (two) times daily as needed for Congestion.         . ranitidine (ZANTAC) 150 MG tablet   Oral   Take 150 mg by mouth 2 (two) times daily.          BP 111/56  Pulse 79  Temp(Src) 98.4 F (36.9 C) (Oral)  Resp 14  SpO2 97% Physical Exam  Constitutional: He is oriented to person, place, and time. He appears well-developed and well-nourished.  HENT:  Head: Normocephalic and atraumatic.  Mouth/Throat: Oropharynx is clear and moist.  Eyes: EOM are normal. Pupils are equal, round, and reactive to light.  Neck:  No tracheal deviation present.  Tracheostomy without surrounding tenderness or discharge. There is a small amount of blood without active bleeding at the site  Cardiovascular: Normal rate, regular rhythm and intact distal pulses.   Pulmonary/Chest: Effort normal and breath sounds normal. No stridor. No respiratory distress.  Abdominal: Soft. There is no tenderness.  Musculoskeletal: Normal range of motion. He exhibits no edema.  Neurological: He is alert and oriented to person, place, and time.  Skin: Skin is warm and dry.    ED Course  Procedures (including critical care time) Labs Review Labs Reviewed  I-STAT CHEM 8, ED - Abnormal; Notable for the following:    Sodium 135 (*)    Chloride 95 (*)    Glucose, Bld 111 (*)    All other components within normal limits  CBC   Imaging Review Dg Chest Portable 1 View  05/17/2013   CLINICAL DATA:  Tracheostomy tube change.  EXAM: PORTABLE  CHEST - 1 VIEW  COMPARISON:  Chest radiograph performed 03/26/2013  FINDINGS: The patient's tracheostomy tube is seen ending 5 cm above the carina.  The lungs are relatively well expanded. Mild bibasilar opacities likely reflect atelectasis. No pleural effusion or pneumothorax is seen.  The cardiomediastinal silhouette remains normal in size. No acute osseous abnormalities are identified. Postoperative change is noted in along the right side of the jaw, and at the right lung apex.  IMPRESSION: 1. Tracheostomy tube seen ending 5 cm above the carina. 2. Mild bibasilar airspace opacities likely reflect atelectasis; lungs otherwise clear.   Electronically Signed   By: Garald Balding M.D.   On: 05/17/2013 02:43   Respiratory bedside. Trach completely removed and replaced with a new trach. Chest x-ray labs obtained and reviewed as above. No fevers, difficulty breathing or clinical tracheitis at this time.  EMR records reviewed - evaluate last month and discharge with antibiotics for possible tracheitis.  Patient is stable and appropriate for discharge. He agrees to return precautions and outpatient followup.  Tracheostomy care instructions provided.   MDM   Diagnosis: Trach fell out/ replacement  Chest x-ray, labs obtained / reviewed as above Vital signs and nursing notes reviewed and considered  Teressa Lower, MD 05/17/13 [phone]

## 2013-05-17 NOTE — Procedures (Signed)
Tracheostomy Change Note  Patient Details:   Name: Nicholas Cobb DOB: 1950/09/03 MRN: [id number]    Airway Documentation:     Evaluation  O2 sats: stable throughout Complications: No apparent complications Patient did tolerate procedure well. Bilateral Breath Sounds: Clear   Pt. Arrived with complaint of trache completely dislodged. RT reinserted pt. With a 6 CFS trache. Pt. Tolerated the procedure well. Pt. Site had some bleeding before the procedure and small blood after. Pt. Also placed on 28% aerosol trache collar.  Marlowe Aschoff 05/17/2013, 1:31 AM

## 2013-05-17 NOTE — Discharge Instructions (Signed)
Care of a Tracheostomy Tube °Keeping the tracheostomy tube clean helps prevent infections and keeps certain trach tubing from plugging. A tracheostomy tube is commonly known as a trach tube. You may have one tube (an outer cannula), or you may have two tubes (an outer and inner cannula). The inner cannula that fits inside the outer cannula is removed for cleaning or replacement. Follow your caregiver's directions as to how often you should change and clean your trach tube.  °SUPPLIES NEEDED °· Towel. °· Suction supplies. °· Sterile trach care kit. °· 4x4 inch (10x10 cm) gauze pads. °· Sterile cotton-tipped swabs. °· Sterile trach bandage (dressing). °· Sterile container. °· 0.9% saline solution. °· Small sterile brush (or disposable inner cannula). °· Roll of twill tape, trach ties, or trach holder. °· Scissors. °· Clean gloves. °· Sterile gloves. °TRACHEOSTOMY CARE  °1. Have all supplies ready and available. °2. Wash hands well. °3. Put on clean gloves. °4. Suction the trach tube as needed. °5. When suctioning is complete, remove soiled trach dressings, gloves, and suction catheter. Throw away the dressings and coiled catheter in the glove. To coil the catheter, roll the catheter around the fingers. Then, pull the glove off inside out so that catheter remains coiled in glove. °6. Wash hands well. °7. Put on sterile gloves. °8. Fill a container 0.9% saline solution. °9. Give oxygen as needed. °10. Clean the inner cannula. °Nondisposable inner cannula. °· While only touching the outer part of the trach tube, unlock and remove the inner cannula. °· Drop the inner cannula into 0.9% saline solution. The saline will loosen secretions. °· Replace the trach collar, trach tube, or ventilator oxygen source over the outer cannula. Do not attach the trach tube and ventilator oxygen devices to all outer cannulas when the inner cannula is removed. °· Quickly pick up the inner cannula out of the saline solution. Use a small brush  to remove the secretions on the inside and outside of the inner cannula. °· Hold the inner cannula over the container. Rinse the cannula with 0.9% saline solution. °· Replace the inner cannula. Secure the locking mechanism. °· Give oxygen as needed. °Disposable inner cannula. °· Remove the new cannula from the packaging. °· Take out the inner cannula while touching only the outer part of the trach tube. °· Replace the old cannula with the new cannula. Lock it into position. °· Throw away the old cannula. °· Give oxygen as needed. °11. Clean the outer cannula surfaces with gauze or cotton swabs, extending 2 4 inches (5 10 cm) in all directions under the neck plate. Using a cotton swab, clean the stoma site in a circular motion from the stoma site outward. °12. Dry the skin and the outer cannula by patting the area gently with a dry gauze pad. °13. Secure the trach tube with trach ties or a trach tube holder. °14. Place a sterile dressing around the trach site. °15. Give oxygen as needed. °16. Throw away any used supplies. °17. Remove gloves. °18. Wash hands well. °Document Released: 03/14/2006 Document Revised: 01/17/2012 Document Reviewed: 09/08/2011 °Exit[employer]® Patient Information ©2014 [employer]. ° °

## 2013-05-17 NOTE — ED Notes (Signed)
6 CFS inserted by respiratory therapist. Pt tolerated procedure well.

## 2013-05-17 NOTE — ED Notes (Signed)
Pt did not take dc papers with him.

## 2013-05-17 NOTE — ED Notes (Addendum)
Pt presents to the ED stating that his trache came out. Pt states that it came out in his sleep. Believes that it haw been over an hr. Pt c/o SOB. Pt wears 6 CFS.

## 2013-06-03 ENCOUNTER — Encounter (HOSPITAL_COMMUNITY): Payer: Self-pay | Admitting: Emergency Medicine

## 2013-06-03 ENCOUNTER — Ambulatory Visit: Payer: Medicare Other | Admitting: Internal Medicine

## 2013-06-03 ENCOUNTER — Emergency Department (HOSPITAL_COMMUNITY)
Admission: EM | Admit: 2013-06-03 | Discharge: 2013-06-03 | Disposition: A | Discharge status: Home / Self Care | Payer: Medicare Other | Attending: Emergency Medicine | Admitting: Emergency Medicine

## 2013-06-03 DIAGNOSIS — Z85819 Personal history of malignant neoplasm of unspecified site of lip, oral cavity, and pharynx: Secondary | ICD-10-CM | POA: Insufficient documentation

## 2013-06-03 DIAGNOSIS — J438 Other emphysema: Secondary | ICD-10-CM | POA: Insufficient documentation

## 2013-06-03 DIAGNOSIS — M47812 Spondylosis without myelopathy or radiculopathy, cervical region: Secondary | ICD-10-CM | POA: Insufficient documentation

## 2013-06-03 DIAGNOSIS — H9190 Unspecified hearing loss, unspecified ear: Secondary | ICD-10-CM | POA: Insufficient documentation

## 2013-06-03 DIAGNOSIS — G8929 Other chronic pain: Secondary | ICD-10-CM | POA: Insufficient documentation

## 2013-06-03 DIAGNOSIS — J9500 Unspecified tracheostomy complication: Secondary | ICD-10-CM

## 2013-06-03 DIAGNOSIS — Z872 Personal history of diseases of the skin and subcutaneous tissue: Secondary | ICD-10-CM | POA: Insufficient documentation

## 2013-06-03 DIAGNOSIS — Z87891 Personal history of nicotine dependence: Secondary | ICD-10-CM | POA: Insufficient documentation

## 2013-06-03 DIAGNOSIS — K219 Gastro-esophageal reflux disease without esophagitis: Secondary | ICD-10-CM | POA: Insufficient documentation

## 2013-06-03 DIAGNOSIS — I10 Essential (primary) hypertension: Secondary | ICD-10-CM | POA: Insufficient documentation

## 2013-06-03 DIAGNOSIS — Z79899 Other long term (current) drug therapy: Secondary | ICD-10-CM | POA: Insufficient documentation

## 2013-06-03 DIAGNOSIS — Z87440 Personal history of urinary (tract) infections: Secondary | ICD-10-CM | POA: Insufficient documentation

## 2013-06-03 DIAGNOSIS — J9503 Malfunction of tracheostomy stoma: Secondary | ICD-10-CM | POA: Insufficient documentation

## 2013-06-03 MED ORDER — DOXYCYCLINE HYCLATE 100 MG PO CAPS: 100.0000 mg | ORAL_CAPSULE | Freq: Two times a day (BID) | ORAL | Status: DC

## 2013-06-03 NOTE — ED Notes (Signed)
Dr. Venora Maples and respiratory at bedside replacing the pt's trach.

## 2013-06-03 NOTE — ED Notes (Signed)
Respiratory at bedside.  Pt reports taking out his own trach.

## 2013-06-03 NOTE — ED Notes (Signed)
Per family report: Pt had his trach replaced two weeks ago at [facility]. Pt reports that his trach feels clogged and pt feels like he cannot breath.

## 2013-06-03 NOTE — ED Provider Notes (Signed)
CSN: [id number]     Arrival date & time 06/03/13  1915 History   First MD Initiated Contact with Patient 06/03/13 1929     CC: tracheostomy tube change   Level V caveat: inability to speak without trach in place  HPI  History of throat cancer and tracheostomy, trach was taken out secondary to difficulty breathing from acute mucous obstruction. Pt took out trach and cleaned trach. Requests replacement now.  No chest pain or difficulty breathing. No fevers or chills. No new cough. Level V caveat applies.   Past Medical History  Diagnosis Date  . Hearing loss   . Change in voice   . Rash   . Trouble swallowing   . Difficulty urinating   . Cervical spondylosis 03/25/2011  . Emphysema 03/25/2011  . GERD (gastroesophageal reflux disease) 03/25/2011  . Chronic pain 03/25/2011  . 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 Xerostomia 03/25/2011  . Ulcer   . UTI (lower urinary tract infection)   . Allergy   . Chronic sinusitis 04/27/2011  . Impaired glucose tolerance 04/27/2011  . Cervical spondylolysis 04/30/2011    severe  . Cancer 1999    throat cancer  . Essential hypertension, benign 12/03/2012   Past Surgical History  Procedure Laterality Date  . Peg tube placement  11/1999  . Tracheostomy  11/1999  . Gastrostomy tube placement     Family History  Problem Relation Age of Onset  . Stroke Other   . Diabetes Other   . Cancer Other     lung cancer   History  Substance Use Topics  . Smoking status: Former Smoker    Quit date: 11/06/1996  . Smokeless tobacco: Never Used  . Alcohol Use: No    Review of Systems  Unable to perform ROS: Other      Allergies  Codeine  Home Medications   Prior to Admission medications   Medication Sig Start Date End Date Taking? Authorizing Provider  amLODipine (NORVASC) 2.5 MG tablet Take 1 tablet (2.5 mg total) by mouth daily. 12/03/12  Yes Biagio Borg, MD  ibuprofen (ADVIL,MOTRIN) 400 MG tablet Take 400 mg by mouth 3 (three) times daily as  needed for moderate pain.   Yes Historical Provider, MD  Limonene LIQD Apply to eye. Apply to eye. Place 2 drops into both eyes daily as needed (clear eyes). Murine eye drops   Yes Historical Provider, MD  OVER THE COUNTER MEDICATION Place 2 drops into both eyes daily as needed (clear eyes). Murine eye drops   Yes Historical Provider, MD  oxymetazoline (12 HOUR NASAL SPRAY) 0.05 % nasal spray Place into the nose. Place 2 sprays into both nostrils 2 (two) times daily as needed for Congestion.   Yes Historical Provider, MD  ranitidine (ZANTAC) 150 MG tablet Take 150 mg by mouth 2 (two) times daily.   Yes Historical Provider, MD   BP 187/91  Pulse 93  Temp(Src) 98.5 F (36.9 C) (Oral)  Resp 16  SpO2 100% Physical Exam  ED Course  TRACHEOSTOMY REPLACEMENT Date/Time: 06/03/2013 7:57 PM Performed by: Hoy Morn Authorized by: Hoy Morn Consent: Verbal consent obtained. Risks and benefits: risks, benefits and alternatives were discussed Consent given by: patient Required items: required blood products, implants, devices, and special equipment available Indications: became dislodged Local anesthesia used: no Patient sedated: no Preparation: Patient was prepped and draped in the usual sterile fashion. Tube type: single cannula Tube cuff: cuffless Tube size: 8.0 mm Seldinger technique: Seldinger technique  used Patient tolerance: Patient tolerated the procedure well with no immediate complications.   (including critical care time) Labs Review Labs Reviewed - No data to display  Imaging Review No results found.   EKG Interpretation None      MDM   Final diagnoses:  Tracheostomy complication   Patient requests antibiotics for increasing mucus secretion.  I think this is reasonable as he may be developing tracheitis.  Patient prescribed Doxy.  Patient tolerated the procedure well.  Breathing normal.  O2 sats 100%.  Discharge home in good condition.     Hoy Morn, MD 06/03/13 2031

## 2013-06-11 ENCOUNTER — Emergency Department (HOSPITAL_COMMUNITY)
Admission: EM | Admit: 2013-06-11 | Discharge: 2013-06-11 | Disposition: A | Discharge status: Home / Self Care | Payer: Medicare Other | Attending: Emergency Medicine | Admitting: Emergency Medicine

## 2013-06-11 ENCOUNTER — Encounter (HOSPITAL_COMMUNITY): Payer: Self-pay | Admitting: Emergency Medicine

## 2013-06-11 DIAGNOSIS — Z8739 Personal history of other diseases of the musculoskeletal system and connective tissue: Secondary | ICD-10-CM | POA: Insufficient documentation

## 2013-06-11 DIAGNOSIS — J438 Other emphysema: Secondary | ICD-10-CM | POA: Insufficient documentation

## 2013-06-11 DIAGNOSIS — J9503 Malfunction of tracheostomy stoma: Secondary | ICD-10-CM | POA: Insufficient documentation

## 2013-06-11 DIAGNOSIS — I10 Essential (primary) hypertension: Secondary | ICD-10-CM | POA: Insufficient documentation

## 2013-06-11 DIAGNOSIS — Z87440 Personal history of urinary (tract) infections: Secondary | ICD-10-CM | POA: Insufficient documentation

## 2013-06-11 DIAGNOSIS — H9190 Unspecified hearing loss, unspecified ear: Secondary | ICD-10-CM | POA: Insufficient documentation

## 2013-06-11 DIAGNOSIS — K219 Gastro-esophageal reflux disease without esophagitis: Secondary | ICD-10-CM | POA: Insufficient documentation

## 2013-06-11 DIAGNOSIS — Z872 Personal history of diseases of the skin and subcutaneous tissue: Secondary | ICD-10-CM | POA: Insufficient documentation

## 2013-06-11 DIAGNOSIS — Z79899 Other long term (current) drug therapy: Secondary | ICD-10-CM | POA: Insufficient documentation

## 2013-06-11 DIAGNOSIS — J9500 Unspecified tracheostomy complication: Secondary | ICD-10-CM

## 2013-06-11 DIAGNOSIS — Y833 Surgical operation with formation of external stoma as the cause of abnormal reaction of the patient, or of later complication, without mention of misadventure at the time of the procedure: Secondary | ICD-10-CM | POA: Insufficient documentation

## 2013-06-11 DIAGNOSIS — Z87891 Personal history of nicotine dependence: Secondary | ICD-10-CM | POA: Insufficient documentation

## 2013-06-11 DIAGNOSIS — G8929 Other chronic pain: Secondary | ICD-10-CM | POA: Insufficient documentation

## 2013-06-11 DIAGNOSIS — Z792 Long term (current) use of antibiotics: Secondary | ICD-10-CM | POA: Insufficient documentation

## 2013-06-11 DIAGNOSIS — Z85819 Personal history of malignant neoplasm of unspecified site of lip, oral cavity, and pharynx: Secondary | ICD-10-CM | POA: Insufficient documentation

## 2013-06-11 NOTE — Progress Notes (Signed)
Called to ED to check patient's trach and reinsert innercannula.Co2 detector placed with good color exchange and a #14 suction catheter inserted with small amount thick yellow bloody secretions. Two therapists attempted to pass inner cannula that the patient brought from home and it would not pass but it was also bent. Patient being transferred to bed 5 in the ED and therapist obtained another another #6 xlt proximal trach and placed at bedside. Patient's O2 sat remained at 98-100% throughout. Will wait for MD to examine before attempting new inner cannula.

## 2013-06-11 NOTE — ED Notes (Signed)
Respiratory was paged at completion of triage assessment.  Working with trach to get cannula to pass.

## 2013-06-11 NOTE — Discharge Instructions (Signed)
Tracheostomy A tracheostomy is a procedure done to keep the airway open. The large tube going from your mouth to your lungs is called the trachea. This tube brings air to the lungs and allows you to breath. When this airway is opened up it is called a tracheostomy. This is done when anything is threatening to close the airway. That may be an infection, an accident, a mass or some other reason. It is a live saving surgical procedure. It is also used to help with breathing when respiratory failure is present. Respiratory failure means that the person does not have the energy or power to keep on breathing. When this occurs, along with most of the other problems mentioned above, the tube is connected to a ventilator (breathing machine) to assist with the breathing. A patient cannot talk with a tracheostomy because the air needed to produce sounds cannot pass the vocal cords (voice box). PROCEDURE  A tracheostomy is most often done under general anesthetic. A general anesthetic means you are sleeping during the procedure. A small incision (cut) is then made down to and through the breathing tube (trachea) so that a plastic tube can be put in. This tube is called a tracheotomy tube. HOW LONG MUST THE TUBE BE IN? The length of time the tracheotomy tube must be in depends on the reason it was inserted.  It may be permanent.  If done for a surgical procedure, it may be removed following the surgery when no longer needed.  If done for respiratory failure, it may be left in until the patient can safely breathe again by themselves without the assistance of a ventilator.  If done because of trauma (this means damage caused in an accident), it will be left in for as long as it takes for the patient to safely breath by themselves again. Your caregiver will discuss the length of time this tube will be left in and how it is to be cared for at home. Document Released: 10/25/2000 Document Revised: 04/24/2011 Document  Reviewed: 03/18/2007 [facility] Patient Information 2014 [place].

## 2013-06-11 NOTE — ED Provider Notes (Signed)
CSN: [id number]     Arrival date & time 06/11/13  1501 History   First MD Initiated Contact with Patient 06/11/13 1651     Chief Complaint  Patient presents with  . tracheostomy complication    HPI Patient presented to the emergency room for evaluation of his tracheostomy. Patient was at home attempting to clean the inner cannula of his tracheostomy. He was unable to replace the inner cannula.  Patient has not had any difficulty with his breathing. He denies any fevers or chills. He denies any vomiting or diarrhea. Patient is followed by doctors in [place].  Patient was recently in the emergency room earlier this month for a similar problem. He had his entire tracheostomy replaced. Past Medical History  Diagnosis Date  . Hearing loss   . Change in voice   . Rash   . Trouble swallowing   . Difficulty urinating   . Cervical spondylosis 03/25/2011  . Emphysema 03/25/2011  . GERD (gastroesophageal reflux disease) 03/25/2011  . Chronic pain 03/25/2011  . 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 Xerostomia 03/25/2011  . Ulcer   . UTI (lower urinary tract infection)   . Allergy   . Chronic sinusitis 04/27/2011  . Impaired glucose tolerance 04/27/2011  . Cervical spondylolysis 04/30/2011    severe  . Cancer 1999    throat cancer  . Essential hypertension, benign 12/03/2012   Past Surgical History  Procedure Laterality Date  . Peg tube placement  11/1999  . Tracheostomy  11/1999  . Gastrostomy tube placement     Family History  Problem Relation Age of Onset  . Stroke Other   . Diabetes Other   . Cancer Other     lung cancer   History  Substance Use Topics  . Smoking status: Former Smoker    Quit date: 11/06/1996  . Smokeless tobacco: Never Used  . Alcohol Use: No    Review of Systems  Constitutional: Negative for fever.  Respiratory: Positive for cough.        He has noticed increased mucus production  Gastrointestinal: Negative for nausea.  Skin: Negative for rash.  All other  systems reviewed and are negative.     Allergies  Codeine  Home Medications   Prior to Admission medications   Medication Sig Start Date End Date Taking? Authorizing Provider  amLODipine (NORVASC) 2.5 MG tablet Take 1 tablet (2.5 mg total) by mouth daily. 12/03/12  Yes Biagio Borg, MD  doxycycline (VIBRAMYCIN) 100 MG capsule Take 1 capsule (100 mg total) by mouth 2 (two) times daily. 06/03/13  Yes Hoy Morn, MD  ibuprofen (ADVIL,MOTRIN) 400 MG tablet Take 400 mg by mouth 3 (three) times daily as needed for moderate pain.   Yes Historical Provider, MD  Limonene LIQD Apply to eye. Apply to eye. Place 2 drops into both eyes daily as needed (clear eyes). Murine eye drops   Yes Historical Provider, MD  OVER THE COUNTER MEDICATION Place 2 drops into both eyes daily as needed (clear eyes). Murine eye drops   Yes Historical Provider, MD  oxymetazoline (12 HOUR NASAL SPRAY) 0.05 % nasal spray Place into the nose. Place 2 sprays into both nostrils 2 (two) times daily as needed for Congestion.   Yes Historical Provider, MD  ranitidine (ZANTAC) 150 MG tablet Take 150 mg by mouth 2 (two) times daily.   Yes Historical Provider, MD   BP 173/82  Pulse 74  Temp(Src) 98.6 F (37 C) (Oral)  Resp 24  SpO2 91% Physical Exam  Nursing note and vitals reviewed. Constitutional: He appears well-developed and well-nourished. No distress.  HENT:  Head: Normocephalic and atraumatic.  Right Ear: External ear normal.  Left Ear: External ear normal.  Postoperative changes lower mandible and face  Eyes: Conjunctivae are normal. Right eye exhibits no discharge. Left eye exhibits no discharge. No scleral icterus.  Neck: Neck supple. No tracheal deviation present.  Tracheostomy without erythema or bleeding  Cardiovascular: Normal rate.   Pulmonary/Chest: Effort normal. No stridor. No respiratory distress.  Musculoskeletal: He exhibits no edema.  Neurological: He is alert. Cranial nerve deficit: no gross  deficits.  Skin: Skin is warm and dry. No rash noted.  Psychiatric: He has a normal mood and affect.    ED Course  Procedures (including critical care time) CO2 indicator was placed on the patient's tracheostomy. There was good color change. The patient's tracheostomy was suctioned by respiratory therapy. I remained at the bedside. I reinserted a flexible inner cannula into the patient's tracheostomy. He tolerated this well.  MDM   Final diagnoses:  Tracheostomy complication    Pt stable for discharge.  Tracheostomy was functioning properly.  Pt was suctioned.  RT did explain tracheostomy care.    Kathalene Frames, MD 06/11/13 [phone]

## 2013-06-11 NOTE — ED Notes (Addendum)
Pt states he was just here for complication with his trach.  Trach tubing was changed.  Now pt trying to clean trach and unable to return inner cannula back into opening.  Pt has attempted suction and unable to remove blockage.  Pt able to breath and airway patent in assessment.

## 2013-06-12 ENCOUNTER — Emergency Department (HOSPITAL_COMMUNITY): Payer: Medicare Other

## 2013-06-12 ENCOUNTER — Encounter (HOSPITAL_COMMUNITY): Payer: Self-pay | Admitting: Emergency Medicine

## 2013-06-12 ENCOUNTER — Emergency Department (HOSPITAL_COMMUNITY)
Admission: EM | Admit: 2013-06-12 | Discharge: 2013-06-12 | Disposition: A | Discharge status: Home / Self Care | Payer: Medicare Other | Attending: Emergency Medicine | Admitting: Emergency Medicine

## 2013-06-12 DIAGNOSIS — J9500 Unspecified tracheostomy complication: Secondary | ICD-10-CM

## 2013-06-12 DIAGNOSIS — Z87440 Personal history of urinary (tract) infections: Secondary | ICD-10-CM | POA: Insufficient documentation

## 2013-06-12 DIAGNOSIS — Z872 Personal history of diseases of the skin and subcutaneous tissue: Secondary | ICD-10-CM | POA: Insufficient documentation

## 2013-06-12 DIAGNOSIS — Z85819 Personal history of malignant neoplasm of unspecified site of lip, oral cavity, and pharynx: Secondary | ICD-10-CM | POA: Insufficient documentation

## 2013-06-12 DIAGNOSIS — J438 Other emphysema: Secondary | ICD-10-CM | POA: Insufficient documentation

## 2013-06-12 DIAGNOSIS — Z79899 Other long term (current) drug therapy: Secondary | ICD-10-CM | POA: Insufficient documentation

## 2013-06-12 DIAGNOSIS — Z87891 Personal history of nicotine dependence: Secondary | ICD-10-CM | POA: Insufficient documentation

## 2013-06-12 DIAGNOSIS — Z8739 Personal history of other diseases of the musculoskeletal system and connective tissue: Secondary | ICD-10-CM | POA: Insufficient documentation

## 2013-06-12 DIAGNOSIS — Z792 Long term (current) use of antibiotics: Secondary | ICD-10-CM | POA: Insufficient documentation

## 2013-06-12 DIAGNOSIS — G8929 Other chronic pain: Secondary | ICD-10-CM | POA: Insufficient documentation

## 2013-06-12 DIAGNOSIS — K219 Gastro-esophageal reflux disease without esophagitis: Secondary | ICD-10-CM | POA: Insufficient documentation

## 2013-06-12 DIAGNOSIS — J9503 Malfunction of tracheostomy stoma: Secondary | ICD-10-CM | POA: Insufficient documentation

## 2013-06-12 DIAGNOSIS — I10 Essential (primary) hypertension: Secondary | ICD-10-CM | POA: Insufficient documentation

## 2013-06-12 NOTE — ED Notes (Signed)
Per pt, has been here for same symptoms starting on the 4th of this month-states the same problems over and over-referred to specialist but has not seen them

## 2013-06-12 NOTE — Discharge Instructions (Signed)
Call Dr. Simeon Craft today, for further evaluation of your ongoing Tracheostomy complications.  Call for a follow up appointment with a Family or Primary Care Provider.  Return if Symptoms worsen.   Take medication as prescribed.

## 2013-06-12 NOTE — ED Notes (Signed)
Pt trach and collar changed via MD and respiratory.  Suctioned trach x 2 during assessment.

## 2013-06-12 NOTE — ED Notes (Signed)
Pt trach being replaced.  All supplies in room.  Waiting for MD.  Pt aware.

## 2013-06-12 NOTE — ED Provider Notes (Signed)
CSN: [id number]     Arrival date & time 06/12/13  0932 History   First MD Initiated Contact with Patient 06/12/13 [phone]     Chief Complaint  Patient presents with  . complications from trache      (Consider location/radiation/quality/duration/timing/severity/associated sxs/prior Treatment) HPI Comments: The patient is a 63 year old male presenting to the emergency department with the patient reports increase in sputum. He reports that there is something blocking the cannula.  Here for similar issues for the past month.  He denies following up with a specialist as an out-patient. He reports it was originally placed by Dr. Jeneen Rinks in 2001.  The history is provided by the patient and medical records. No language interpreter was used (Patient is able to write his compliants).    Past Medical History  Diagnosis Date  . Hearing loss   . Change in voice   . Rash   . Trouble swallowing   . Difficulty urinating   . Cervical spondylosis 03/25/2011  . Emphysema 03/25/2011  . GERD (gastroesophageal reflux disease) 03/25/2011  . Chronic pain 03/25/2011  . 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 Xerostomia 03/25/2011  . Ulcer   . UTI (lower urinary tract infection)   . Allergy   . Chronic sinusitis 04/27/2011  . Impaired glucose tolerance 04/27/2011  . Cervical spondylolysis 04/30/2011    severe  . Cancer 1999    throat cancer  . Essential hypertension, benign 12/03/2012   Past Surgical History  Procedure Laterality Date  . Peg tube placement  11/1999  . Tracheostomy  11/1999  . Gastrostomy tube placement     Family History  Problem Relation Age of Onset  . Stroke Other   . Diabetes Other   . Cancer Other     lung cancer   History  Substance Use Topics  . Smoking status: Former Smoker    Quit date: 11/06/1996  . Smokeless tobacco: Never Used  . Alcohol Use: No    Review of Systems  Constitutional: Negative for fever and chills.  Respiratory: Positive for cough and shortness of breath.    Cardiovascular: Negative for chest pain.  Gastrointestinal: Negative for abdominal pain.  Musculoskeletal: Negative for neck pain and neck stiffness.  Skin: Negative for color change.  All other systems reviewed and are negative.     Allergies  Codeine  Home Medications   Prior to Admission medications   Medication Sig Start Date End Date Taking? Authorizing Provider  amLODipine (NORVASC) 2.5 MG tablet Take 1 tablet (2.5 mg total) by mouth daily. 12/03/12   Biagio Borg, MD  doxycycline (VIBRAMYCIN) 100 MG capsule Take 1 capsule (100 mg total) by mouth 2 (two) times daily. 06/03/13   Hoy Morn, MD  ibuprofen (ADVIL,MOTRIN) 400 MG tablet Take 400 mg by mouth 3 (three) times daily as needed for moderate pain.    Historical Provider, MD  Limonene LIQD Apply to eye. Apply to eye. Place 2 drops into both eyes daily as needed (clear eyes). Murine eye drops    Historical Provider, MD  OVER THE COUNTER MEDICATION Place 2 drops into both eyes daily as needed (clear eyes). Murine eye drops    Historical Provider, MD  oxymetazoline (12 HOUR NASAL SPRAY) 0.05 % nasal spray Place into the nose. Place 2 sprays into both nostrils 2 (two) times daily as needed for Congestion.    Historical Provider, MD  ranitidine (ZANTAC) 150 MG tablet Take 150 mg by mouth 2 (two) times daily.  Historical Provider, MD   BP 149/89  Pulse 81  Temp(Src) 97.8 F (36.6 C) (Oral)  Resp 18  SpO2 99% Physical Exam  Nursing note and vitals reviewed. Constitutional: Vital signs are normal. He appears well-developed and well-nourished. No distress.  HENT:  Tracheostomy site without discharge, bleeding, or surrounding erythema.  Jaw line with post surgical changes.  Neck: Normal range of motion. Neck supple.  Cardiovascular: Normal rate and regular rhythm.   Pulmonary/Chest: Effort normal. No accessory muscle usage or stridor. Not tachypneic. No respiratory distress. He has no wheezes. He has no rhonchi. He has no  rales.  Neurological: He is alert.  Skin: Skin is warm and dry. He is not diaphoretic.    ED Course  Procedures (including critical care time) Labs Review Labs Reviewed - No data to display  Imaging Review Dg Chest 2 View  06/12/2013   CLINICAL DATA:  Patient stress that tracheostomy, short of breath  EXAM: CHEST  2 VIEW  COMPARISON:  Radiograph 05/17/2013  FINDINGS: Tracheostomy tube is in good position. Normal cardiac silhouette. No effusion, infiltrate, or pneumothorax. Right mandibular reconstruction noted.  IMPRESSION: No acute cardiopulmonary process.   Electronically Signed   By: Suzy Bouchard M.D.   On: 06/12/2013 11:53     EKG Interpretation None      MDM   Final diagnoses:  Tracheostomy complication   Patient presents with trache displacement, 99% on room air, NAD. He reports increased sputum production. Pt was here yesterday for the same complaint. Dr. Betsey Holiday replace trache .  X-ray to evaluate increased sputum production. XR without acute findings.  Discharge instructions to follow up with ENT to determine cause of blockage.  Meds given in ED:  Medications - No data to display  Discharge Medication List as of 06/12/2013 12:08 PM       Ander Purpura Burnetta Sabin, PA-C 06/13/13 1554

## 2013-06-15 NOTE — ED Provider Notes (Signed)
Medical screening examination/treatment/procedure(s) were conducted as a shared visit with non-physician practitioner(s) and myself.  I personally evaluated the patient during the encounter.   EKG Interpretation None      Patient reports having difficulty breathing because his trachea cannula keeps clogging with sputum. He is in no respiratory distress upon arrival. I did change the cannula myself. Patient will need followup with ENT, as he has been having increased sputum production and problems with his cannulas over the last several weeks.  Orpah Greek, MD 06/15/13 [phone]

## 2013-07-21 ENCOUNTER — Other Ambulatory Visit: Payer: Self-pay | Admitting: Internal Medicine

## 2013-08-19 ENCOUNTER — Encounter (HOSPITAL_COMMUNITY): Payer: Self-pay | Admitting: Emergency Medicine

## 2013-08-19 ENCOUNTER — Emergency Department (HOSPITAL_COMMUNITY)
Admission: EM | Admit: 2013-08-19 | Discharge: 2013-08-20 | Disposition: A | Discharge status: Home / Self Care | Payer: Medicare Other | Attending: Emergency Medicine | Admitting: Emergency Medicine

## 2013-08-19 DIAGNOSIS — K219 Gastro-esophageal reflux disease without esophagitis: Secondary | ICD-10-CM | POA: Insufficient documentation

## 2013-08-19 DIAGNOSIS — Z87440 Personal history of urinary (tract) infections: Secondary | ICD-10-CM | POA: Insufficient documentation

## 2013-08-19 DIAGNOSIS — Z8739 Personal history of other diseases of the musculoskeletal system and connective tissue: Secondary | ICD-10-CM | POA: Insufficient documentation

## 2013-08-19 DIAGNOSIS — Z87891 Personal history of nicotine dependence: Secondary | ICD-10-CM | POA: Insufficient documentation

## 2013-08-19 DIAGNOSIS — I10 Essential (primary) hypertension: Secondary | ICD-10-CM | POA: Insufficient documentation

## 2013-08-19 DIAGNOSIS — G8929 Other chronic pain: Secondary | ICD-10-CM | POA: Insufficient documentation

## 2013-08-19 DIAGNOSIS — J438 Other emphysema: Secondary | ICD-10-CM | POA: Insufficient documentation

## 2013-08-19 DIAGNOSIS — Z430 Encounter for attention to tracheostomy: Secondary | ICD-10-CM | POA: Insufficient documentation

## 2013-08-19 DIAGNOSIS — Z85819 Personal history of malignant neoplasm of unspecified site of lip, oral cavity, and pharynx: Secondary | ICD-10-CM | POA: Insufficient documentation

## 2013-08-19 DIAGNOSIS — Z79899 Other long term (current) drug therapy: Secondary | ICD-10-CM | POA: Insufficient documentation

## 2013-08-19 DIAGNOSIS — Z872 Personal history of diseases of the skin and subcutaneous tissue: Secondary | ICD-10-CM | POA: Insufficient documentation

## 2013-08-19 NOTE — ED Notes (Signed)
RT called to come provide trach care per Dr. Jeneen Rinks.

## 2013-08-19 NOTE — ED Notes (Signed)
Initial Contact - pt to [facility] with c/o clogged trach tube.  Pt reports attempting to change inner cannula earlier tonight and unable to pass new cannula.  Pt reports feeling SOB.  #6shiley in place, RR even/un-lab, sl wheezing heard on auscultation, much upper airway referred sounds noted.  Pt reports yellow/white sputum earlier today.  Skin PWD.  MAEI, ambulatory with steady gait.  Place to o2 monitor, NAD.

## 2013-08-19 NOTE — ED Notes (Signed)
Pt thinks his trach is clogged, he's unable to advance a new canula

## 2013-08-19 NOTE — ED Notes (Signed)
Pt's ENT Dr is in [facility]

## 2013-08-19 NOTE — ED Notes (Signed)
RT at bedside.

## 2013-08-20 NOTE — Progress Notes (Signed)
RT lavaged Pt with approximately 20cc's of sterile H20 via trach site.  RT suctioned Pt for a moderated amount of thin frothy, blood tinged secretions with small tan plugs.  Pt stated that he feels better and RT was able to replace inner cannula without difficulty.  Pt stated that he is ready to go home.  RT to monitor and assess as needed.

## 2013-08-20 NOTE — Discharge Instructions (Signed)
Care of a Tracheostomy Tube °Keeping the tracheostomy tube clean helps prevent infections and keeps certain trach tubing from plugging. A tracheostomy tube is commonly known as a trach tube. You may have one tube (an outer cannula), or you may have two tubes (an outer and inner cannula). The inner cannula that fits inside the outer cannula is removed for cleaning or replacement. Follow your caregiver's directions as to how often you should change and clean your trach tube.  °SUPPLIES NEEDED °· Towel. °· Suction supplies. °· Sterile trach care kit. °¨ 4x4 inch (10x10 cm) gauze pads. °¨ Sterile cotton-tipped swabs. °¨ Sterile trach bandage (dressing). °¨ Sterile container. °¨ 0.9% saline solution. °¨ Small sterile brush (or disposable inner cannula). °¨ Roll of twill tape, trach ties, or trach holder. °¨ Scissors. °· Clean gloves. °· Sterile gloves. °TRACHEOSTOMY CARE  °1. Have all supplies ready and available. °2. Wash hands well. °3. Put on clean gloves. °4. Suction the trach tube as needed. °5. When suctioning is complete, remove soiled trach dressings, gloves, and suction catheter. Throw away the dressings and coiled catheter in the glove. To coil the catheter, roll the catheter around the fingers. Then, pull the glove off inside out so that catheter remains coiled in glove. °6. Wash hands well. °7. Put on sterile gloves. °8. Fill a container 0.9% saline solution. °9. Give oxygen as needed. °10. Clean the inner cannula. °Nondisposable inner cannula. °· While only touching the outer part of the trach tube, unlock and remove the inner cannula. °· Drop the inner cannula into 0.9% saline solution. The saline will loosen secretions. °· Replace the trach collar, trach tube, or ventilator oxygen source over the outer cannula. Do not attach the trach tube and ventilator oxygen devices to all outer cannulas when the inner cannula is removed. °· Quickly pick up the inner cannula out of the saline solution. Use a small brush  to remove the secretions on the inside and outside of the inner cannula. °· Hold the inner cannula over the container. Rinse the cannula with 0.9% saline solution. °· Replace the inner cannula. Secure the locking mechanism. °· Give oxygen as needed. °Disposable inner cannula. °· Remove the new cannula from the packaging. °· Take out the inner cannula while touching only the outer part of the trach tube. °· Replace the old cannula with the new cannula. Lock it into position. °· Throw away the old cannula. °· Give oxygen as needed. °11. Clean the outer cannula surfaces with gauze or cotton swabs, extending 2-4 inches (5-10 cm) in all directions under the neck plate. Using a cotton swab, clean the stoma site in a circular motion from the stoma site outward. °12. Dry the skin and the outer cannula by patting the area gently with a dry gauze pad. °13. Secure the trach tube with trach ties or a trach tube holder. °14. Place a sterile dressing around the trach site. °15. Give oxygen as needed. °16. Throw away any used supplies. °17. Remove gloves. °18. Wash hands well. °Document Released: 03/14/2006 Document Revised: 01/17/2012 Document Reviewed: 09/08/2011 °[employer]® Patient Information ©2015 [employer]. This information is not intended to replace advice given to you by your health care provider. Make sure you discuss any questions you have with your health care provider. ° °

## 2013-08-20 NOTE — ED Provider Notes (Signed)
CSN: [id number]     Arrival date & time 08/19/13  2207 History   First MD Initiated Contact with Patient 08/19/13 2255     Chief Complaint  Patient presents with  . Tracheostomy Tube Change      HPI  Patient presents with concern about tracheostomy. He is breathing without difficulty. However, he states that when he attempted to pick out and clean his inner cannula is having difficulty replacing it. He goes in with a bit of difficulty and he feels like he cannot breathe and has to remove it. He's been having a normal amount secretions without change or excess. Is not having pain or bleeding from the site. Long-standing tracheostomy for laryngeal cancer.  Past Medical History  Diagnosis Date  . Hearing loss   . Change in voice   . Rash   . Trouble swallowing   . Difficulty urinating   . Cervical spondylosis 03/25/2011  . Emphysema 03/25/2011  . GERD (gastroesophageal reflux disease) 03/25/2011  . Chronic pain 03/25/2011  . 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 Xerostomia 03/25/2011  . Ulcer   . UTI (lower urinary tract infection)   . Allergy   . Chronic sinusitis 04/27/2011  . Impaired glucose tolerance 04/27/2011  . Cervical spondylolysis 04/30/2011    severe  . Cancer 1999    throat cancer  . Essential hypertension, benign 12/03/2012   Past Surgical History  Procedure Laterality Date  . Peg tube placement  11/1999  . Tracheostomy  11/1999  . Gastrostomy tube placement     Family History  Problem Relation Age of Onset  . Stroke Other   . Diabetes Other   . Cancer Other     lung cancer   History  Substance Use Topics  . Smoking status: Former Smoker    Quit date: 11/06/1996  . Smokeless tobacco: Never Used  . Alcohol Use: No    Review of Systems  Constitutional: Negative for fever, chills, diaphoresis, appetite change and fatigue.  HENT: Negative for mouth sores, sore throat and trouble swallowing.        Tracheostomy difficulties as above  Eyes: Negative for visual  disturbance.  Respiratory: Negative for cough, chest tightness, shortness of breath and wheezing.   Cardiovascular: Negative for chest pain.  Gastrointestinal: Negative for nausea, vomiting, abdominal pain, diarrhea and abdominal distention.  Endocrine: Negative for polydipsia, polyphagia and polyuria.  Genitourinary: Negative for dysuria, frequency and hematuria.  Musculoskeletal: Negative for gait problem.  Skin: Negative for color change, pallor and rash.  Neurological: Negative for dizziness, syncope, light-headedness and headaches.  Hematological: Does not bruise/bleed easily.  Psychiatric/Behavioral: Negative for behavioral problems and confusion.      Allergies  Codeine  Home Medications   Prior to Admission medications   Medication Sig Start Date End Date Taking? Authorizing Provider  amLODipine (NORVASC) 2.5 MG tablet Take 1 tablet (2.5 mg total) by mouth daily. 12/03/12  Yes Biagio Borg, MD  ibuprofen (ADVIL,MOTRIN) 400 MG tablet Take 400 mg by mouth 3 (three) times daily as needed for moderate pain.   Yes Historical Provider, MD  OVER THE COUNTER MEDICATION Place 2 drops into both eyes daily as needed (clear eyes). Murine eye drops   Yes Historical Provider, MD  oxymetazoline (12 HOUR NASAL SPRAY) 0.05 % nasal spray Place into the nose. Place 2 sprays into both nostrils 2 (two) times daily as needed for Congestion.   Yes Historical Provider, MD  ranitidine (ZANTAC) 150 MG tablet Take 150 mg by  mouth 2 (two) times daily.   Yes Historical Provider, MD   BP 135/70  Pulse 72  Temp(Src) 98 F (36.7 C)  Resp 22  SpO2 95% Physical Exam  Neck:      ED Course  Procedures (including critical care time) Labs Review Labs Reviewed - No data to display  Imaging Review No results found.   EKG Interpretation None      MDM   Final diagnoses:  Encounter for tracheostomy tube change    A new inner cannula is obtained and able to replace the patient has no sensation  of difficulty breathing. Think this may be a simple problem with some thickened secretions. The patient breathing difficulty will be discharged home.    Tanna Furry, MD 08/20/13 [phone]

## 2013-08-21 ENCOUNTER — Emergency Department (HOSPITAL_COMMUNITY): Payer: Medicare Other

## 2013-08-21 ENCOUNTER — Encounter (HOSPITAL_COMMUNITY): Payer: Self-pay | Admitting: Emergency Medicine

## 2013-08-21 ENCOUNTER — Emergency Department (HOSPITAL_COMMUNITY)
Admission: EM | Admit: 2013-08-21 | Discharge: 2013-08-21 | Disposition: A | Discharge status: Home / Self Care | Payer: Medicare Other | Attending: Emergency Medicine | Admitting: Emergency Medicine

## 2013-08-21 DIAGNOSIS — J438 Other emphysema: Secondary | ICD-10-CM | POA: Insufficient documentation

## 2013-08-21 DIAGNOSIS — Z87440 Personal history of urinary (tract) infections: Secondary | ICD-10-CM | POA: Insufficient documentation

## 2013-08-21 DIAGNOSIS — Z872 Personal history of diseases of the skin and subcutaneous tissue: Secondary | ICD-10-CM | POA: Insufficient documentation

## 2013-08-21 DIAGNOSIS — G8929 Other chronic pain: Secondary | ICD-10-CM | POA: Insufficient documentation

## 2013-08-21 DIAGNOSIS — J9509 Other tracheostomy complication: Secondary | ICD-10-CM | POA: Insufficient documentation

## 2013-08-21 DIAGNOSIS — Z79899 Other long term (current) drug therapy: Secondary | ICD-10-CM | POA: Insufficient documentation

## 2013-08-21 DIAGNOSIS — M47812 Spondylosis without myelopathy or radiculopathy, cervical region: Secondary | ICD-10-CM | POA: Insufficient documentation

## 2013-08-21 DIAGNOSIS — K219 Gastro-esophageal reflux disease without esophagitis: Secondary | ICD-10-CM | POA: Insufficient documentation

## 2013-08-21 DIAGNOSIS — Z430 Encounter for attention to tracheostomy: Secondary | ICD-10-CM

## 2013-08-21 DIAGNOSIS — I10 Essential (primary) hypertension: Secondary | ICD-10-CM | POA: Insufficient documentation

## 2013-08-21 DIAGNOSIS — Z85819 Personal history of malignant neoplasm of unspecified site of lip, oral cavity, and pharynx: Secondary | ICD-10-CM | POA: Insufficient documentation

## 2013-08-21 DIAGNOSIS — H9190 Unspecified hearing loss, unspecified ear: Secondary | ICD-10-CM | POA: Insufficient documentation

## 2013-08-21 NOTE — Consult Note (Signed)
Reason for Consult:Tracheostomy obstruction Referring Physician: ER  Nicholas Cobb is an 63 y.o. male.  HPI: 63 year old male with history of head and neck cancer managed through the [facility].  He has been tracheostomy-dependent for 13 years.  He presented to the ER two days ago with shortness of breath and the tracheostomy tube inner cannula was able to be positioned and he was discharged.  He returned today with similar symptoms and inability to replace the inner cannula.  Respiratory therapy and the ER staff have had similar difficulty.  Consultation was requested.  He is stable.  Past Medical History  Diagnosis Date  . Hearing loss   . Change in voice   . Rash   . Trouble swallowing   . Difficulty urinating   . Cervical spondylosis 03/25/2011  . Emphysema 03/25/2011  . GERD (gastroesophageal reflux disease) 03/25/2011  . Chronic pain 03/25/2011  . 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 Xerostomia 03/25/2011  . Ulcer   . UTI (lower urinary tract infection)   . Allergy   . Chronic sinusitis 04/27/2011  . Impaired glucose tolerance 04/27/2011  . Cervical spondylolysis 04/30/2011    severe  . Cancer 1999    throat cancer  . Essential hypertension, benign 12/03/2012    Past Surgical History  Procedure Laterality Date  . Peg tube placement  11/1999  . Tracheostomy  11/1999  . Gastrostomy tube placement      Family History  Problem Relation Age of Onset  . Stroke Other   . Diabetes Other   . Cancer Other     lung cancer    Social History:  reports that he quit smoking about 16 years ago. He has never used smokeless tobacco. He reports that he does not drink alcohol or use illicit drugs.  Allergies:  Allergies  Allergen Reactions  . Codeine Nausea And Vomiting and Rash    Medications: I have reviewed the patient's current medications.  No results found for this or any previous visit (from the past 48 hour(s)).  Dg Chest 2 View  08/21/2013   CLINICAL DATA:  Difficulty  breathing with trach  EXAM: CHEST  2 VIEW  COMPARISON:  06/12/2013  FINDINGS: Tracheostomy in satisfactory position.  Lungs are clear.  No pleural effusion or pneumothorax.  The heart is normal in size.  Mild degenerative changes involving the upper thoracic spine.  Postsurgical changes involving the right mandible.  IMPRESSION: No evidence of acute cardiopulmonary disease.   Electronically Signed   By: Nicholas Cobb M.D.   On: 08/21/2013 18:30    Review of Systems  Respiratory: Positive for shortness of breath.   All other systems reviewed and are negative.  Pulse 84, temperature 100 F (37.8 C), temperature source Oral, resp. rate 19, SpO2 95.00%. Physical Exam  Constitutional: He is oriented to person, place, and time. He appears well-developed and well-nourished. No distress.  HENT:  Head: Normocephalic and atraumatic.  Right Ear: External ear normal.  Left Ear: External ear normal.  Nose: Nose normal.  Mouth/Throat: Oropharynx is clear and moist.  Eyes: Conjunctivae and EOM are normal. Pupils are equal, round, and reactive to light.  Neck:  Post radical neck dissection on right.  Right lower facial edema.  #6 extended length cuffless Shiley trach tube in place but unable to pass inner cannula without it kinking.  Neck range of motion very limited with difficulty extending the neck at all.  Cardiovascular: Normal rate.   Respiratory: Effort normal.  Musculoskeletal: Normal range of motion.  Neurological: He is alert and oriented to person, place, and time. No cranial nerve deficit.  Skin: Skin is warm and dry.  Psychiatric: He has a normal mood and affect. His behavior is normal. Judgment and thought content normal.    Assessment/Plan: Tracheostomy dependent, history of head and neck cancer The tracheostomy was changed to a new proximal extended length #6 cuffless Shiley trach tube without difficulty and he was able to breathe freely.  The inner cannula was easily placed.  The  old tube was crusted in the distal portion of the tube.  Stable for discharge.  He has follow-up planned in [place] later this month.  Nicholas Cobb 08/21/2013, 8:01 PM

## 2013-08-21 NOTE — Discharge Instructions (Signed)
Return here as needed. Follow up with your ENT and the [facility].

## 2013-08-21 NOTE — ED Provider Notes (Signed)
CSN: [id number]     Arrival date & time 08/21/13  1524 History   First MD Initiated Contact with Patient 08/21/13 1540     Chief Complaint  Patient presents with  . Tracheostomy Tube Change     (Consider location/radiation/quality/duration/timing/severity/associated sxs/prior Treatment) HPI The patient presents for trach care. The patient states that he always has a feeling like his trach gets blocked. The patient has been here multiple times for this. The patient denies fever weakness, dizziness, rash or syncope. The patient is mad that no one will change his trach here in the ER. Past Medical History  Diagnosis Date  . Hearing loss   . Change in voice   . Rash   . Trouble swallowing   . Difficulty urinating   . Cervical spondylosis 03/25/2011  . Emphysema 03/25/2011  . GERD (gastroesophageal reflux disease) 03/25/2011  . Chronic pain 03/25/2011  . 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 Xerostomia 03/25/2011  . Ulcer   . UTI (lower urinary tract infection)   . Allergy   . Chronic sinusitis 04/27/2011  . Impaired glucose tolerance 04/27/2011  . Cervical spondylolysis 04/30/2011    severe  . Cancer 1999    throat cancer  . Essential hypertension, benign 12/03/2012   Past Surgical History  Procedure Laterality Date  . Peg tube placement  11/1999  . Tracheostomy  11/1999  . Gastrostomy tube placement     Family History  Problem Relation Age of Onset  . Stroke Other   . Diabetes Other   . Cancer Other     lung cancer   History  Substance Use Topics  . Smoking status: Former Smoker    Quit date: 11/06/1996  . Smokeless tobacco: Never Used  . Alcohol Use: No    Review of Systems  All other systems negative except as documented in the HPI. All pertinent positives and negatives as reviewed in the HPI.   Allergies  Codeine  Home Medications   Prior to Admission medications   Medication Sig Start Date End Date Taking? Authorizing Provider  amLODipine (NORVASC) 2.5 MG  tablet Take 1 tablet (2.5 mg total) by mouth daily. 12/03/12   Biagio Borg, MD  ibuprofen (ADVIL,MOTRIN) 400 MG tablet Take 400 mg by mouth 3 (three) times daily as needed for moderate pain.    Historical Provider, MD  OVER THE COUNTER MEDICATION Place 2 drops into both eyes daily as needed (clear eyes). Murine eye drops    Historical Provider, MD  oxymetazoline (12 HOUR NASAL SPRAY) 0.05 % nasal spray Place into the nose. Place 2 sprays into both nostrils 2 (two) times daily as needed for Congestion.    Historical Provider, MD  ranitidine (ZANTAC) 150 MG tablet Take 150 mg by mouth 2 (two) times daily.    Historical Provider, MD   Pulse 84  Temp(Src) 100 F (37.8 C) (Oral)  Resp 19  SpO2 95% Physical Exam  Nursing note and vitals reviewed. Constitutional: He is oriented to person, place, and time. He appears well-developed and well-nourished. No distress.  HENT:  Head: Normocephalic and atraumatic.  Mouth/Throat: Oropharynx is clear and moist.  Eyes: Pupils are equal, round, and reactive to light.  Cardiovascular: Normal rate, regular rhythm and normal heart sounds.  Exam reveals no gallop and no friction rub.   No murmur heard. Pulmonary/Chest: Effort normal and breath sounds normal. No respiratory distress.  Neurological: He is alert and oriented to person, place, and time.  Skin: Skin is warm and  dry.    ED Course  Procedures (including critical care time)   The patient is given a referral to the [facility] for weekly care of his trach. Told to follow up with his PCP and ENT. Patient is seen by RT as well with irrigation and deep suctioning.  Patient has been stable here in the ER.  Brent General, PA-C 08/21/13 [phone]

## 2013-08-21 NOTE — ED Notes (Signed)
Pt states his trach is clogged up.  Was just seen here recently for same.

## 2013-08-21 NOTE — ED Notes (Signed)
Mingo Amber EDP and Tammy RT and Kim RT at bedside.

## 2013-08-21 NOTE — ED Notes (Signed)
Pt states he was here two days ago because he believes that there is something wrong with his trach.  States that they "didn't do anything". States that he wanted it changed but they wouldn't do it and sent him to an ENT.  States he is seeing a Administrator to sue [place] for not changing his trach.  Pt breathing through trach with no issues.  States he feels like his trach is blocked.  States that we are racists and they "don't have racists in [place]".

## 2013-08-21 NOTE — Progress Notes (Signed)
RT tracheal suctioned PT x 3 with 12 French catheter. Resulted in scant to small mucus with slight pink tinge. RT attempted to install inner cannula (provided by PT) that is marked for #6 SH XLT trach. Inner cannula is difficult to advance and when in place PT complains it is difficult to breath. RT removed inner cannula and notified RN. PT states he feels that something is blocking the trach. Sp02 remains stable. RT shared [place] with PT.

## 2013-08-21 NOTE — ED Notes (Signed)
RT contacted to deep suction pt

## 2013-08-21 NOTE — ED Notes (Addendum)
Went in to discharge pt.  He states that he needs his trach inner cannula placed.  He reports that he has a pending appt with his ENT in [place] on the 20th.  Pt states that if nothing is done for him today that he will call his daughter and sue the hospital for malpractice.  Mingo Amber EDP and Gerald Stabs EDPA notified and will be in to speak to pt.

## 2013-08-21 NOTE — ED Notes (Signed)
MD at bedside. [facility] ENT

## 2013-08-21 NOTE — ED Notes (Signed)
Pt asked how long before the EDP is going to insert the inner cannula in his trach.  He's made aware that he is also waiting for the RT to come assist the EDP and that the EDP has other patients to see.  Pt states his daughter can come and take him to [place].  This nurse tried to convince pt to might as well stay since he's already been waiting.  He agreed to stay at this time.

## 2013-08-21 NOTE — ED Notes (Signed)
RT at bedside.

## 2013-08-21 NOTE — ED Notes (Signed)
Called RT to come at bedside to assist EDP in inserting inner cannula.  SPoke to [employer]

## 2013-08-22 NOTE — ED Provider Notes (Signed)
Medical screening examination/treatment/procedure(s) were conducted as a shared visit with non-physician practitioner(s) and myself.  I personally evaluated the patient during the encounter.   EKG Interpretation None       Patient here with difficulty breathing through trach. Second visit in 3 days. Cannot put in his inner canula in his trach.  RT unable to replace inner cannula. When i attempted, inner canula kinking. He has XLT shiley due to his altered anatomy from his prior head and neck cancer.  Dr. Redmond Baseman with ENT replaced trach at bedside.   Osvaldo Shipper, MD 08/22/13 [phone]

## 2013-08-28 ENCOUNTER — Emergency Department (HOSPITAL_COMMUNITY): Payer: Medicare Other

## 2013-08-28 ENCOUNTER — Encounter (HOSPITAL_COMMUNITY): Payer: Self-pay | Admitting: Emergency Medicine

## 2013-08-28 ENCOUNTER — Emergency Department (HOSPITAL_COMMUNITY)
Admission: EM | Admit: 2013-08-28 | Discharge: 2013-08-28 | Disposition: A | Discharge status: Home / Self Care | Payer: Medicare Other | Attending: Emergency Medicine | Admitting: Emergency Medicine

## 2013-08-28 DIAGNOSIS — G8929 Other chronic pain: Secondary | ICD-10-CM | POA: Insufficient documentation

## 2013-08-28 DIAGNOSIS — Z430 Encounter for attention to tracheostomy: Secondary | ICD-10-CM | POA: Insufficient documentation

## 2013-08-28 DIAGNOSIS — I10 Essential (primary) hypertension: Secondary | ICD-10-CM | POA: Insufficient documentation

## 2013-08-28 DIAGNOSIS — J438 Other emphysema: Secondary | ICD-10-CM | POA: Insufficient documentation

## 2013-08-28 DIAGNOSIS — K219 Gastro-esophageal reflux disease without esophagitis: Secondary | ICD-10-CM | POA: Insufficient documentation

## 2013-08-28 DIAGNOSIS — Z8521 Personal history of malignant neoplasm of larynx: Secondary | ICD-10-CM | POA: Insufficient documentation

## 2013-08-28 DIAGNOSIS — Z872 Personal history of diseases of the skin and subcutaneous tissue: Secondary | ICD-10-CM | POA: Insufficient documentation

## 2013-08-28 DIAGNOSIS — Z79899 Other long term (current) drug therapy: Secondary | ICD-10-CM | POA: Insufficient documentation

## 2013-08-28 DIAGNOSIS — Z8739 Personal history of other diseases of the musculoskeletal system and connective tissue: Secondary | ICD-10-CM | POA: Insufficient documentation

## 2013-08-28 DIAGNOSIS — Z87891 Personal history of nicotine dependence: Secondary | ICD-10-CM | POA: Insufficient documentation

## 2013-08-28 DIAGNOSIS — Z87440 Personal history of urinary (tract) infections: Secondary | ICD-10-CM | POA: Insufficient documentation

## 2013-08-28 NOTE — Discharge Instructions (Signed)
Care of a Tracheostomy Tube °Keeping the tracheostomy tube clean helps prevent infections and keeps certain trach tubing from plugging. A tracheostomy tube is commonly known as a trach tube. You may have one tube (an outer cannula), or you may have two tubes (an outer and inner cannula). The inner cannula that fits inside the outer cannula is removed for cleaning or replacement. Follow your caregiver's directions as to how often you should change and clean your trach tube.  °SUPPLIES NEEDED °· Towel. °· Suction supplies. °· Sterile trach care kit. °¨ 4x4 inch (10x10 cm) gauze pads. °¨ Sterile cotton-tipped swabs. °¨ Sterile trach bandage (dressing). °¨ Sterile container. °¨ 0.9% saline solution. °¨ Small sterile brush (or disposable inner cannula). °¨ Roll of twill tape, trach ties, or trach holder. °¨ Scissors. °· Clean gloves. °· Sterile gloves. °TRACHEOSTOMY CARE  °1. Have all supplies ready and available. °2. Wash hands well. °3. Put on clean gloves. °4. Suction the trach tube as needed. °5. When suctioning is complete, remove soiled trach dressings, gloves, and suction catheter. Throw away the dressings and coiled catheter in the glove. To coil the catheter, roll the catheter around the fingers. Then, pull the glove off inside out so that catheter remains coiled in glove. °6. Wash hands well. °7. Put on sterile gloves. °8. Fill a container 0.9% saline solution. °9. Give oxygen as needed. °10. Clean the inner cannula. °Nondisposable inner cannula. °· While only touching the outer part of the trach tube, unlock and remove the inner cannula. °· Drop the inner cannula into 0.9% saline solution. The saline will loosen secretions. °· Replace the trach collar, trach tube, or ventilator oxygen source over the outer cannula. Do not attach the trach tube and ventilator oxygen devices to all outer cannulas when the inner cannula is removed. °· Quickly pick up the inner cannula out of the saline solution. Use a small brush  to remove the secretions on the inside and outside of the inner cannula. °· Hold the inner cannula over the container. Rinse the cannula with 0.9% saline solution. °· Replace the inner cannula. Secure the locking mechanism. °· Give oxygen as needed. °Disposable inner cannula. °· Remove the new cannula from the packaging. °· Take out the inner cannula while touching only the outer part of the trach tube. °· Replace the old cannula with the new cannula. Lock it into position. °· Throw away the old cannula. °· Give oxygen as needed. °11. Clean the outer cannula surfaces with gauze or cotton swabs, extending 2-4 inches (5-10 cm) in all directions under the neck plate. Using a cotton swab, clean the stoma site in a circular motion from the stoma site outward. °12. Dry the skin and the outer cannula by patting the area gently with a dry gauze pad. °13. Secure the trach tube with trach ties or a trach tube holder. °14. Place a sterile dressing around the trach site. °15. Give oxygen as needed. °16. Throw away any used supplies. °17. Remove gloves. °18. Wash hands well. °Document Released: 03/14/2006 Document Revised: 01/17/2012 Document Reviewed: 09/08/2011 °[employer]® Patient Information ©2015 [employer]. This information is not intended to replace advice given to you by your health care provider. Make sure you discuss any questions you have with your health care provider. ° °

## 2013-08-28 NOTE — ED Provider Notes (Signed)
History of cancer, and has had tracheostomy for many years, states that he became occluded today, he removed his tracheostomy tube at home and realized that it was occluded, his breathing improved however the tracheostomy tube needs to be replaced at this time. He is on home oxygen. On exam the patient has significant kyphotic deformity of the neck, tracheostomy is patent, the patient is able to speak by putting his tracheostomy, he is not in distress.   Patient placed in the supine position, the tracheostomy tube replaced on first attempt, x-ray ordered to rule out infection and to evaluate placement. Blow-by oxygen given and suction successful  Medical screening examination/treatment/procedure(s) were conducted as a shared visit with non-physician practitioner(s) and myself.  I personally evaluated the patient during the encounter.  Clinical Impression: Tracheostomy complication     Johnna Acosta, MD 08/28/13 [phone]

## 2013-08-28 NOTE — ED Provider Notes (Signed)
CSN: [id number]     Arrival date & time 08/28/13  0617 History   First MD Initiated Contact with Patient 08/28/13 [phone]     Chief Complaint  Patient presents with  . Tracheostomy Tube Change     (Consider location/radiation/quality/duration/timing/severity/associated sxs/prior Treatment) HPI Comments: Patient is a 63 year old male with history of emphysema, GERD, osteoradionecrosis of jaw, hypertension who presents today because his tracheostomy tube became clogged and he pulled it out. He reports this happened approximately 2 hours prior to arrival. This is a recurrent problem. He has had a trach for 14 years, but recently had this tube changed 1 week ago. He denies shortness of breath, chest pain, dizziness, lightheadedness, fevers.   The history is provided by the patient. No language interpreter was used.    Past Medical History  Diagnosis Date  . Hearing loss   . Change in voice   . Rash   . Trouble swallowing   . Difficulty urinating   . Cervical spondylosis 03/25/2011  . Emphysema 03/25/2011  . GERD (gastroesophageal reflux disease) 03/25/2011  . Chronic pain 03/25/2011  . 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 Xerostomia 03/25/2011  . Ulcer   . UTI (lower urinary tract infection)   . Allergy   . Chronic sinusitis 04/27/2011  . Impaired glucose tolerance 04/27/2011  . Cervical spondylolysis 04/30/2011    severe  . Cancer 1999    throat cancer  . Essential hypertension, benign 12/03/2012   Past Surgical History  Procedure Laterality Date  . Peg tube placement  11/1999  . Tracheostomy  11/1999  . Gastrostomy tube placement     Family History  Problem Relation Age of Onset  . Stroke Other   . Diabetes Other   . Cancer Other     lung cancer   History  Substance Use Topics  . Smoking status: Former Smoker    Quit date: 11/06/1996  . Smokeless tobacco: Never Used  . Alcohol Use: No    Review of Systems  Constitutional: Negative for fever and chills.  HENT:   Tracheostomy removed  Respiratory: Negative for shortness of breath.   Cardiovascular: Negative for chest pain.  Neurological: Negative for dizziness and light-headedness.  All other systems reviewed and are negative.     Allergies  Codeine  Home Medications   Prior to Admission medications   Medication Sig Start Date End Date Taking? Authorizing Provider  amLODipine (NORVASC) 2.5 MG tablet Take 1 tablet (2.5 mg total) by mouth daily. 12/03/12  Yes Biagio Borg, MD  ibuprofen (ADVIL,MOTRIN) 400 MG tablet Take 400 mg by mouth 3 (three) times daily as needed for moderate pain.   Yes Historical Provider, MD  OVER THE COUNTER MEDICATION Place 2 drops into both eyes daily as needed (clear eyes). Murine eye drops   Yes Historical Provider, MD  oxymetazoline (12 HOUR NASAL SPRAY) 0.05 % nasal spray Place into the nose. Place 2 sprays into both nostrils 2 (two) times daily as needed for Congestion.   Yes Historical Provider, MD  ranitidine (ZANTAC) 150 MG tablet Take 150 mg by mouth 2 (two) times daily.   Yes Historical Provider, MD   BP 151/80  Pulse 71  Temp(Src) 99 F (37.2 C) (Oral)  Resp 20  SpO2 100% Physical Exam  Nursing note and vitals reviewed. Constitutional: He is oriented to person, place, and time. He appears well-developed and well-nourished. No distress.  HENT:  Head: Normocephalic and atraumatic.  Right Ear: External ear normal.  Left Ear: External ear normal.  Nose: Nose normal.  Eyes: Conjunctivae are normal.  Neck: Normal range of motion. No tracheal deviation present.  Stoma does not appear cellulitic. No drainage or surrounding erythema.   Kyphotic deformity of neck  Cardiovascular: Normal rate, regular rhythm and normal heart sounds.   Pulmonary/Chest: Effort normal and breath sounds normal. No stridor.  Abdominal: Soft. He exhibits no distension. There is no tenderness.  Musculoskeletal: Normal range of motion.  Neurological: He is alert and oriented to  person, place, and time.  Skin: Skin is warm and dry. He is not diaphoretic.  Psychiatric: He has a normal mood and affect. His behavior is normal.    ED Course  Procedures (including critical care time) Labs Review Labs Reviewed - No data to display  Imaging Review Dg Chest [facility] 1 View  08/28/2013   CLINICAL DATA:  Shortness of breath.  EXAM: PORTABLE CHEST - 1 VIEW  COMPARISON:  August 21, 2013.  FINDINGS: The heart size and mediastinal contours are within normal limits. Both lungs are clear. Tracheostomy is in grossly good position. No pneumothorax or pleural effusion is noted. The visualized skeletal structures are unremarkable.  IMPRESSION: Tracheostomy in grossly good position. No acute cardiopulmonary abnormality seen.   Electronically Signed   By: Sabino Dick M.D.   On: 08/28/2013 07:29     EKG Interpretation None      MDM   Final diagnoses:  Tracheostomy care    Patient presents to day after removing his tracheostomy tube because it was clogged. New tracheostomy tube placed by Dr. Sabra Heck without complication. Patient given ENT follow up. He was encouraged to keep his appointments and return with worsening symptoms. CXR shows no acute cardiopulmonary abnormality. Tracheostomy in good position. Return instructions given. Vital signs stable for discharge. Patient / Family / Caregiver informed of clinical course, understand medical decision-making process, and agree with plan.    Elwyn Lade, PA-C 08/28/13 [phone]

## 2013-08-28 NOTE — Progress Notes (Signed)
RN called RT about Patient. Patients oxygen was 95% on room air. Patient is stable without any respiratory distress. Patient pulled out his flang due to inner cannula being blocked what looked like old dried secretions. MD replaced the flang without any problem. RT placed patient on trach collar on 28%. Patient has tolerated well. RT will continue to monitor as needed.

## 2013-08-28 NOTE — ED Notes (Signed)
Pt states his trach tube got clogged and he had to take it out and needs a new one put back in

## 2013-08-28 NOTE — ED Notes (Signed)
Dr Miller at bedside. 

## 2013-08-28 NOTE — ED Notes (Signed)
X-ray at bedside

## 2013-08-29 NOTE — ED Provider Notes (Signed)
Medical screening examination/treatment/procedure(s) were conducted as a shared visit with non-physician practitioner(s) and myself.  I personally evaluated the patient during the encounter  Please see my separate respective documentation pertaining to this patient encounter   Johnna Acosta, MD 08/29/13 [phone]

## 2013-08-30 ENCOUNTER — Encounter (HOSPITAL_COMMUNITY): Payer: Self-pay | Admitting: Emergency Medicine

## 2013-08-30 ENCOUNTER — Emergency Department (HOSPITAL_COMMUNITY)
Admission: EM | Admit: 2013-08-30 | Discharge: 2013-08-30 | Disposition: A | Discharge status: Home / Self Care | Payer: Medicare Other | Attending: Emergency Medicine | Admitting: Emergency Medicine

## 2013-08-30 DIAGNOSIS — Z87891 Personal history of nicotine dependence: Secondary | ICD-10-CM | POA: Insufficient documentation

## 2013-08-30 DIAGNOSIS — M269 Dentofacial anomaly, unspecified: Secondary | ICD-10-CM | POA: Insufficient documentation

## 2013-08-30 DIAGNOSIS — Z872 Personal history of diseases of the skin and subcutaneous tissue: Secondary | ICD-10-CM | POA: Insufficient documentation

## 2013-08-30 DIAGNOSIS — J9500 Unspecified tracheostomy complication: Secondary | ICD-10-CM

## 2013-08-30 DIAGNOSIS — K219 Gastro-esophageal reflux disease without esophagitis: Secondary | ICD-10-CM | POA: Insufficient documentation

## 2013-08-30 DIAGNOSIS — I10 Essential (primary) hypertension: Secondary | ICD-10-CM | POA: Insufficient documentation

## 2013-08-30 DIAGNOSIS — H9190 Unspecified hearing loss, unspecified ear: Secondary | ICD-10-CM | POA: Insufficient documentation

## 2013-08-30 DIAGNOSIS — Z85819 Personal history of malignant neoplasm of unspecified site of lip, oral cavity, and pharynx: Secondary | ICD-10-CM | POA: Insufficient documentation

## 2013-08-30 DIAGNOSIS — Z8739 Personal history of other diseases of the musculoskeletal system and connective tissue: Secondary | ICD-10-CM | POA: Insufficient documentation

## 2013-08-30 DIAGNOSIS — Z87440 Personal history of urinary (tract) infections: Secondary | ICD-10-CM | POA: Insufficient documentation

## 2013-08-30 DIAGNOSIS — J9503 Malfunction of tracheostomy stoma: Secondary | ICD-10-CM | POA: Insufficient documentation

## 2013-08-30 DIAGNOSIS — G8929 Other chronic pain: Secondary | ICD-10-CM | POA: Insufficient documentation

## 2013-08-30 DIAGNOSIS — Z79899 Other long term (current) drug therapy: Secondary | ICD-10-CM | POA: Insufficient documentation

## 2013-08-30 DIAGNOSIS — Y833 Surgical operation with formation of external stoma as the cause of abnormal reaction of the patient, or of later complication, without mention of misadventure at the time of the procedure: Secondary | ICD-10-CM | POA: Insufficient documentation

## 2013-08-30 DIAGNOSIS — J438 Other emphysema: Secondary | ICD-10-CM | POA: Insufficient documentation

## 2013-08-30 DIAGNOSIS — R061 Stridor: Secondary | ICD-10-CM | POA: Insufficient documentation

## 2013-08-30 NOTE — ED Provider Notes (Signed)
Medical screening examination/treatment/procedure(s) were conducted as a shared visit with non-physician practitioner(s) or resident and myself. I personally evaluated the patient during the encounter and agree with the findings.  I have personally reviewed any xrays and/ or EKG's with the provider and I agree with interpretation.  Patient with history of cancer, radiation tracheostomy for many years comes in to the ER as he feels is partially occluded. Patient has had this issue multiple times in the past. Patient does have close followup on Monday with ENT and then after that we'll be in the trach clinic seen. On exam patient is in no distress, no focal rales on exam, no signs of infections are in the trach or bleeding. The respiratory therapist, resident and I suctioned the trach and then patient suctioned at himself and felt mild improvement. We discussed benefits and risks of trying to place a tracheostomy and patient asked [place] to try. Patient brought in an extra trach tube and it was placed successfully and plan to monitor him to ensure he tolerates well as looking at the old trach tube it was kinked and we feel he needed different type of trach when he sees ENT as a little bit more rigid as it goes down.  Procedure  Tracheostomy tube placement  Patient's own extra trach tube was used and using saline prior to and gloves the tracheostomy tube was placed with one attempt.  Patient monitored afterwards.  Tracheostomy tube placement   Mariea Clonts, MD 08/30/13 1540

## 2013-08-30 NOTE — Discharge Instructions (Signed)
Nicholas Cobb, you came to the ED regarding issues with your tracheostomy tube. We suctioned your throat and replaced your tube. Your breathing improved. Please follow-up with your ENT doctor on Monday.  Care of a Tracheostomy Tube Keeping the tracheostomy tube clean helps prevent infections and keeps certain trach tubing from plugging. A tracheostomy tube is commonly known as a trach tube. You may have one tube (an outer cannula), or you may have two tubes (an outer and inner cannula). The inner cannula that fits inside the outer cannula is removed for cleaning or replacement. Follow your caregiver's directions as to how often you should change and clean your trach tube.  SUPPLIES NEEDED  Towel.  Suction supplies.  Sterile trach care kit.  4x4 inch (10x10 cm) gauze pads.  Sterile cotton-tipped swabs.  Sterile trach bandage (dressing).  Sterile container.  0.9% saline solution.  Small sterile brush (or disposable inner cannula).  Roll of twill tape, trach ties, or trach holder.  Scissors.  Clean gloves.  Sterile gloves. TRACHEOSTOMY CARE  1. Have all supplies ready and available. 2. Wash hands well. 3. Put on clean gloves. 4. Suction the trach tube as needed. 5. When suctioning is complete, remove soiled trach dressings, gloves, and suction catheter. Throw away the dressings and coiled catheter in the glove. To coil the catheter, roll the catheter around the fingers. Then, pull the glove off inside out so that catheter remains coiled in glove. 6. Wash hands well. 7. Put on sterile gloves. 8. Fill a container 0.9% saline solution. 9. Give oxygen as needed. 10. Clean the inner cannula. Nondisposable inner cannula.  While only touching the outer part of the trach tube, unlock and remove the inner cannula.  Drop the inner cannula into 0.9% saline solution. The saline will loosen secretions.  Replace the trach collar, trach tube, or ventilator oxygen source over the outer  cannula. Do not attach the trach tube and ventilator oxygen devices to all outer cannulas when the inner cannula is removed.  Quickly pick up the inner cannula out of the saline solution. Use a small brush to remove the secretions on the inside and outside of the inner cannula.  Hold the inner cannula over the container. Rinse the cannula with 0.9% saline solution.  Replace the inner cannula. Secure the locking mechanism.  Give oxygen as needed. Disposable inner cannula.  Remove the new cannula from the packaging.  Take out the inner cannula while touching only the outer part of the trach tube.  Replace the old cannula with the new cannula. Lock it into position.  Throw away the old cannula.  Give oxygen as needed. 11. Clean the outer cannula surfaces with gauze or cotton swabs, extending 2-4 inches (5-10 cm) in all directions under the neck plate. Using a cotton swab, clean the stoma site in a circular motion from the stoma site outward. 12. Dry the skin and the outer cannula by patting the area gently with a dry gauze pad. 13. Secure the trach tube with trach ties or a trach tube holder. 14. Place a sterile dressing around the trach site. 15. Give oxygen as needed. 16. Throw away any used supplies. 17. Remove gloves. 18. Wash hands well. Document Released: 03/14/2006 Document Revised: 01/17/2012 Document Reviewed: 09/08/2011 [facility] Patient Information 2015 [place], [place]. This information is not intended to replace advice given to you by your health care provider. Make sure you discuss any questions you have with your health care provider.

## 2013-08-30 NOTE — ED Provider Notes (Signed)
CSN: [id number]     Arrival date & time 08/30/13  0720 History   First MD Initiated Contact with Patient 08/30/13 [phone]     Chief Complaint  Patient presents with  . Tracheostomy Tube Change     (Consider location/radiation/quality/duration/timing/severity/associated sxs/prior Treatment) HPI Patient presents with one day history of feeling that his tracheostomy tube is clogged. He has a history of osteoradionecrosis and tracheostomy. He was recently seen in the ED foor similar issue and had tracheostomy tube replaced at that time. No cough, no fever, no chest pain, no shortness of breath as he says his lungs are fine but states he has trouble around his neck. No sick contacts. Patient with ENT follow-up at [facility].  Past Medical History  Diagnosis Date  . Hearing loss   . Change in voice   . Rash   . Trouble swallowing   . Difficulty urinating   . Cervical spondylosis 03/25/2011  . Emphysema 03/25/2011  . GERD (gastroesophageal reflux disease) 03/25/2011  . Chronic pain 03/25/2011  . 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 Xerostomia 03/25/2011  . Ulcer   . UTI (lower urinary tract infection)   . Allergy   . Chronic sinusitis 04/27/2011  . Impaired glucose tolerance 04/27/2011  . Cervical spondylolysis 04/30/2011    severe  . Cancer 1999    throat cancer  . Essential hypertension, benign 12/03/2012   Past Surgical History  Procedure Laterality Date  . Peg tube placement  11/1999  . Tracheostomy  11/1999  . Gastrostomy tube placement     Family History  Problem Relation Age of Onset  . Stroke Other   . Diabetes Other   . Cancer Other     lung cancer   History  Substance Use Topics  . Smoking status: Former Smoker    Quit date: 11/06/1996  . Smokeless tobacco: Never Used  . Alcohol Use: No    Review of Systems  Constitutional: Negative for fever and chills.  Respiratory: Positive for shortness of breath and stridor. Negative for cough and wheezing.   All other systems reviewed  and are negative.     Allergies  Codeine  Home Medications   Prior to Admission medications   Medication Sig Start Date End Date Taking? Authorizing Provider  amLODipine (NORVASC) 2.5 MG tablet Take 1 tablet (2.5 mg total) by mouth daily. 12/03/12   Biagio Borg, MD  ibuprofen (ADVIL,MOTRIN) 400 MG tablet Take 400 mg by mouth 3 (three) times daily as needed for moderate pain.    Historical Provider, MD  OVER THE COUNTER MEDICATION Place 2 drops into both eyes daily as needed (clear eyes). Murine eye drops    Historical Provider, MD  oxymetazoline (12 HOUR NASAL SPRAY) 0.05 % nasal spray Place into the nose. Place 2 sprays into both nostrils 2 (two) times daily as needed for Congestion.    Historical Provider, MD  ranitidine (ZANTAC) 150 MG tablet Take 150 mg by mouth 2 (two) times daily.    Historical Provider, MD   BP 172/75  Pulse 94  Temp(Src) 99.4 F (37.4 C) (Oral)  Resp 18  SpO2 100%  Physical Exam  Constitutional: He is oriented to person, place, and time.  HENT:  Jaw deformity  Eyes: Conjunctivae and EOM are normal.  Neck: Normal range of motion.  Cardiovascular: Normal rate and regular rhythm.   Pulmonary/Chest: Effort normal. No accessory muscle usage. Not tachypneic. No respiratory distress. He has no wheezes. He has no rhonchi.  Transmitted upper  airway sounds  Abdominal:  G-tube intact  Neurological: He is alert and oriented to person, place, and time.  Skin: Skin is warm and dry.    ED Course  Procedures (including critical care time) Labs Review Labs Reviewed - No data to display  Imaging Review No results found.   EKG Interpretation None     Respiratory therapy in room with patient. Suctioning improved patients respiratory status. Tracheostomy tube successfully placed.  MDM   Final diagnoses:  Complication of tracheostomy tube    Patient presents for change of tracheostomy tube. Vitals signs stable. Patient not in any distress. Breathing  comfortably. Patient suctioned successfully and tracheostomy tube replaced with minor resistance but locked. Patient states he has an appointment with ENT at [facility] on Monday, 7/20 and will follow-up at that time. Oxygen saturation 100% on room air. Patient understands and agrees with plan. Stable for discharge home.    Cordelia Poche, MD 08/30/13 [phone]

## 2013-08-30 NOTE — Progress Notes (Signed)
RT suctioned PT with 12 French cath- resulted in stable Sp02 on RA of 99%, and scant clear sputum. PT requested he suction himself times 2- results same as previous. Inner Cannula was passable with Sp02 on RA of 100%. PT states he is breathing better. No trach change warranted at this time. PT has ENT appt at [facility] 09/01/13 and [facility] aptt. 09/24/13 at 1300. RN and MD aware of these mentioned events.

## 2013-08-30 NOTE — ED Notes (Signed)
MD Charmayne Sheer from Respiratory at bedside suctioning pt.  MD Nettey changed the trach inner cath out.  Pt sts some pain in jaw.  Mucus removed by pt and respiratory with catheter.

## 2013-08-30 NOTE — ED Notes (Signed)
Called RT and was advised that this pt calls everyday in regards to his trach. She states that he is supposed to be seen at the trach clinic. RT also advised that the procedure to replace must be done by the physician, not RT.

## 2013-08-30 NOTE — ED Notes (Signed)
Pt states trach tubing is kinked and when inner cannula placed, difficult to breath.

## 2013-08-30 NOTE — ED Notes (Signed)
Dr Reather Converse, Resident and RT at bedside for trach tube change

## 2013-08-30 NOTE — ED Notes (Signed)
Resident at bedside.  

## 2013-08-30 NOTE — ED Notes (Signed)
Pt comes to [facility] ED with trach issues.  Resp paged for trach suction per pt's request.  Suction set up at bedside with yonker.  O2 sats monitored continuously.  Pt is 97% on RA.

## 2013-08-30 NOTE — ED Notes (Signed)
Pt attempted to esign but signature pad malfunctioned. Pt verbalized understanding of d/c paperwork and follow-up

## 2013-09-17 ENCOUNTER — Encounter (HOSPITAL_COMMUNITY): Payer: Self-pay | Admitting: Emergency Medicine

## 2013-09-17 ENCOUNTER — Emergency Department (HOSPITAL_COMMUNITY)
Admission: EM | Admit: 2013-09-17 | Discharge: 2013-09-17 | Disposition: A | Discharge status: Home / Self Care | Payer: Medicare Other | Attending: Emergency Medicine | Admitting: Emergency Medicine

## 2013-09-17 DIAGNOSIS — G8929 Other chronic pain: Secondary | ICD-10-CM | POA: Diagnosis not present

## 2013-09-17 DIAGNOSIS — Z87891 Personal history of nicotine dependence: Secondary | ICD-10-CM | POA: Insufficient documentation

## 2013-09-17 DIAGNOSIS — Z8669 Personal history of other diseases of the nervous system and sense organs: Secondary | ICD-10-CM | POA: Insufficient documentation

## 2013-09-17 DIAGNOSIS — I10 Essential (primary) hypertension: Secondary | ICD-10-CM | POA: Diagnosis not present

## 2013-09-17 DIAGNOSIS — J9509 Other tracheostomy complication: Secondary | ICD-10-CM | POA: Insufficient documentation

## 2013-09-17 DIAGNOSIS — Z872 Personal history of diseases of the skin and subcutaneous tissue: Secondary | ICD-10-CM | POA: Diagnosis not present

## 2013-09-17 DIAGNOSIS — J438 Other emphysema: Secondary | ICD-10-CM | POA: Diagnosis not present

## 2013-09-17 DIAGNOSIS — Z791 Long term (current) use of non-steroidal anti-inflammatories (NSAID): Secondary | ICD-10-CM | POA: Diagnosis not present

## 2013-09-17 DIAGNOSIS — Z931 Gastrostomy status: Secondary | ICD-10-CM | POA: Insufficient documentation

## 2013-09-17 DIAGNOSIS — Z87440 Personal history of urinary (tract) infections: Secondary | ICD-10-CM | POA: Diagnosis not present

## 2013-09-17 DIAGNOSIS — K219 Gastro-esophageal reflux disease without esophagitis: Secondary | ICD-10-CM | POA: Diagnosis not present

## 2013-09-17 DIAGNOSIS — Z85819 Personal history of malignant neoplasm of unspecified site of lip, oral cavity, and pharynx: Secondary | ICD-10-CM | POA: Diagnosis not present

## 2013-09-17 DIAGNOSIS — J9500 Unspecified tracheostomy complication: Secondary | ICD-10-CM

## 2013-09-17 DIAGNOSIS — Z8739 Personal history of other diseases of the musculoskeletal system and connective tissue: Secondary | ICD-10-CM | POA: Diagnosis not present

## 2013-09-17 MED ORDER — SODIUM CHLORIDE FLUSH 0.9 % IV SOLN: 5.0000 mL | Freq: Three times a day (TID) | INTRAVENOUS | Status: DC | PRN

## 2013-09-17 NOTE — Progress Notes (Signed)
Rt called to room WA19. Pt is a trach #6 xlt Shiley proximal. Rt suction thick bloody secretions. Rt put 10CC 's of saline down trach, blood cleared slowly. Rt placed a new inner canula in trach. Pt concerned that he may become occlude with bloody secretions. MD aware of what Rt did. Pt in no distress at this time. Pt states he has a trach clinical appointment Aug.12,2015.

## 2013-09-17 NOTE — ED Provider Notes (Signed)
CSN: [id number]     Arrival date & time 09/17/13  1617 History   First MD Initiated Contact with Patient 09/17/13 1629     Chief Complaint  Patient presents with  . trach problems      (Consider location/radiation/quality/duration/timing/severity/associated sxs/prior Treatment) HPI Comments: Nicholas Cobb is a 63 y.o. Male with a PMHx of GERD, osteoradionecrosis of jaw, throat CA (1999), and GERD with a tracheostomy since 2001, who presents today with complaints that he had an episode of suctioning bright red blood from his trach site at 3pm. Cannot quantify amount of blood. Denies any recent trauma or instrumentation to this area. Denies any fevers, chills, rhinorrhea, sinus congestion, nose bleeds, abd pain, N/V/D/C, melena, hematochezia, weakness, CP, SOB, dizziness/lightheadedness, fatigue, jaw/tracheostomy site pain, or any other concerns at this time. Denies any recent illnesses, cough, or sputum production. Was seen in the ENT clinic at [facility] 09/01/13. Does state that this has happened before, intermittently, and he's been scoped for this before. Requesting xrays.  The history is provided by the patient. No language interpreter was used.    Past Medical History  Diagnosis Date  . Hearing loss   . Change in voice   . Rash   . Trouble swallowing   . Difficulty urinating   . Cervical spondylosis 03/25/2011  . Emphysema 03/25/2011  . GERD (gastroesophageal reflux disease) 03/25/2011  . Chronic pain 03/25/2011  . 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 Xerostomia 03/25/2011  . Ulcer   . UTI (lower urinary tract infection)   . Allergy   . Chronic sinusitis 04/27/2011  . Impaired glucose tolerance 04/27/2011  . Cervical spondylolysis 04/30/2011    severe  . Cancer 1999    throat cancer  . Essential hypertension, benign 12/03/2012   Past Surgical History  Procedure Laterality Date  . Peg tube placement  11/1999  . Tracheostomy  11/1999  . Gastrostomy tube placement     Family History   Problem Relation Age of Onset  . Stroke Other   . Diabetes Other   . Cancer Other     lung cancer   History  Substance Use Topics  . Smoking status: Former Smoker    Quit date: 11/06/1996  . Smokeless tobacco: Never Used  . Alcohol Use: No    Review of Systems  Constitutional: Negative for fever.  HENT: Negative for nosebleeds, rhinorrhea and trouble swallowing.        +Tracheostomy issue (blood suctioned from trach earlier today)  Respiratory: Negative for cough, choking, shortness of breath, wheezing and stridor.   Cardiovascular: Negative for chest pain.  Gastrointestinal: Negative for nausea, vomiting, abdominal pain, diarrhea, constipation and blood in stool.  Musculoskeletal: Negative for arthralgias and myalgias.  Skin: Negative for color change.  Neurological: Negative for dizziness, syncope and light-headedness.  Psychiatric/Behavioral: Negative for confusion.  10 Systems reviewed and are negative for acute change except as noted in the HPI.     Allergies  Codeine  Home Medications   Prior to Admission medications   Medication Sig Start Date End Date Taking? Authorizing Provider  amLODipine (NORVASC) 2.5 MG tablet Take 1 tablet (2.5 mg total) by mouth daily. 12/03/12  Yes Biagio Borg, MD  ibuprofen (ADVIL,MOTRIN) 400 MG tablet Take 400 mg by mouth 3 (three) times daily as needed for moderate pain.   Yes Historical Provider, MD  naphazoline-pheniramine (NAPHCON-A) 0.025-0.3 % ophthalmic solution Place 2 drops into both eyes 4 (four) times daily as needed for irritation (red eye).  Yes Historical Provider, MD  oxymetazoline (12 HOUR NASAL SPRAY) 0.05 % nasal spray Place into the nose. Place 2 sprays into both nostrils 2 (two) times daily as needed for Congestion.   Yes Historical Provider, MD  ranitidine (ZANTAC) 150 MG tablet Take 150 mg by mouth 2 (two) times daily.   Yes Historical Provider, MD  Sodium Chloride Flush 0.9 % SOLN injection Place 5 mLs into  feeding tube every 8 (eight) hours as needed (to flush secretions). Place 22mL per tracheostomy every 8 hours as needed to flush secretions. 09/17/13   Macyn Remmert Strupp Camprubi-Soms, PA-C   BP 163/85  Pulse 92  Temp(Src) 99.3 F (37.4 C) (Oral)  SpO2 94% Physical Exam  Nursing note and vitals reviewed. Constitutional: He is oriented to person, place, and time. Vital signs are normal. He appears well-developed and well-nourished.  Non-toxic appearance. No distress.  VSS, baseline oxygen saturations on RA as well as baseline BP  HENT:  Head: Normocephalic and atraumatic.  Nose: Nose normal.  Mouth/Throat: Oropharynx is clear and moist and mucous membranes are normal.  Jaw deformity chronic  Eyes: Conjunctivae and EOM are normal. Right eye exhibits no discharge. Left eye exhibits no discharge.  Neck: Normal range of motion. Neck supple. No edema and no erythema present.  Trach site free of bleeding or mucus, pt covers area to speak. No erythema or edema to this area. No stridor noted.  Cardiovascular: Normal rate, regular rhythm, normal heart sounds and intact distal pulses.  Exam reveals no gallop and no friction rub.   No murmur heard. Pulmonary/Chest: Effort normal and breath sounds normal. No respiratory distress. He has no decreased breath sounds. He has no wheezes. He has no rales.  CTAB in all lung fields  Abdominal: Soft. Normal appearance and bowel sounds are normal. He exhibits no distension. There is no tenderness. There is no rigidity, no rebound and no guarding.  G tube in place, free of erythema  Musculoskeletal: Normal range of motion.  Neurological: He is alert and oriented to person, place, and time.  Skin: Skin is warm, dry and intact. No rash noted.  Psychiatric: He has a normal mood and affect.    ED Course  Procedures (including critical care time) Labs Review Labs Reviewed - No data to display  Imaging Review No results found.   EKG Interpretation None       MDM   Final diagnoses:  Tracheostomy complication, unspecified tracheostomy complication    Pt unable to provide much history regarding quantity of blood seen. Pt oxygenating well and no obvious bleeding noted, VSS and does not appear to be having s/sx of massive bleeding. I believe this is likely due to traumatic suctioning of trach, and do not feel imaging would provide any information at this time although pt is adamant about wanting this done. Discussed with Dr. Regenia Skeeter, who agrees with plan to consult ENT regarding this issue. Will await consult for further plan.  0:62 PM Dr. Erik Obey stating that pt should use saline bullet lavages and if bleeding doesn't improve in 2-3 days, to see his office or his regular ENT, whichever pt prefers. I explained the diagnosis and have given explicit precautions to return to the ER including for any other new or worsening symptoms. The patient understands and accepts the medical plan as it's been dictated and I have answered their questions. Discharge instructions concerning home care and prescriptions have been given. The patient is STABLE and is discharged to home in good condition.  BP 163/85  Pulse 92  Temp(Src) 99.3 F (37.4 C) (Oral)  SpO2 94%  Meds ordered this encounter  Medications  . Sodium Chloride Flush 0.9 % SOLN injection    Sig: Place 5 mLs into feeding tube every 8 (eight) hours as needed (to flush secretions). Place 44mL per tracheostomy every 8 hours as needed to flush secretions.    Dispense:  5 mL    Refill:  100    Order Specific Question:  Supervising Provider    Answer:  Johnna Acosta [[id number]]    [place], PA-C 09/17/13 1827

## 2013-09-17 NOTE — ED Notes (Signed)
Per pt, states problem with trach-difficult to understand

## 2013-09-17 NOTE — ED Provider Notes (Signed)
Medical screening examination/treatment/procedure(s) were conducted as a shared visit with non-physician practitioner(s) and myself.  I personally evaluated the patient during the encounter.   EKG Interpretation None       Patient with intermittent bleeding from trach with suctioning only. No coughing. No fevers or shortness of breath. Patient requesting ENT discogram in the emergency department. I discussed with Dr. Erik Obey, who at this time recommends saline bullet lavages and mild suctioning. Records followup in his office in a couple days or the patient's [place] [facility] office. Patient appears unhappy with this frequently references that he will finally fill out one of these post ED visit surveys. However this time the patient has no symptoms of anemia, has normal vital signs, and does not need an emergent scope given he is talking without any issues, breathing normally, and has no hemoptysis at this time. Suctioned and lavaged by respiratory, will discharge with ENT followup.  Ephraim Hamburger, MD 09/17/13 2252

## 2013-09-17 NOTE — Discharge Instructions (Signed)
Your bleeding from your tracheostomy could be just from airway trauma, but the ENT doctor recommended you use the flushes as directed to help with flushing the tracheostomy, and follow up with Dr. Erik Obey (ENT) in 2-3 days if this problem persists, or see your regular ENT doctor at [facility]. Return to the ER for any changes or worsening symptoms.

## 2013-09-24 ENCOUNTER — Ambulatory Visit (HOSPITAL_COMMUNITY)
Admission: RE | Admit: 2013-09-24 | Discharge: 2013-09-24 | Disposition: A | Discharge status: Home / Self Care | Payer: Medicare Other | Source: Ambulatory Visit | Attending: Internal Medicine | Admitting: Internal Medicine

## 2013-09-24 DIAGNOSIS — Z430 Encounter for attention to tracheostomy: Secondary | ICD-10-CM | POA: Insufficient documentation

## 2013-09-24 DIAGNOSIS — M272 Inflammatory conditions of jaws: Secondary | ICD-10-CM

## 2013-09-24 DIAGNOSIS — Y842 Radiological procedure and radiotherapy as the cause of abnormal reaction of the patient, or of later complication, without mention of misadventure at the time of the procedure: Secondary | ICD-10-CM

## 2013-09-24 DIAGNOSIS — Z431 Encounter for attention to gastrostomy: Secondary | ICD-10-CM

## 2013-09-24 DIAGNOSIS — M278 Other specified diseases of jaws: Secondary | ICD-10-CM

## 2013-09-24 DIAGNOSIS — IMO0002 Reserved for concepts with insufficient information to code with codable children: Secondary | ICD-10-CM

## 2013-09-24 DIAGNOSIS — C329 Malignant neoplasm of larynx, unspecified: Secondary | ICD-10-CM

## 2013-09-24 DIAGNOSIS — Z930 Tracheostomy status: Secondary | ICD-10-CM

## 2013-09-24 NOTE — Progress Notes (Signed)
Tracheostomy Procedure Note  Nicholas Cobb [id number] April 25, 1950  Pre Procedure Tracheostomy Information  Vitals: HR-74, RR-18, SpO2-98%, BP-155/90 on RA  Trach Brand: Shiley Size: 6xlt Style: Proximal and Uncuffed Secured by: Velcro   Procedure: trach change    Post Procedure Tracheostomy Information  Trach Brand: Shiley Size: 6xlt Style: Proximal and Uncuffed Secured by: Velcro   Post Procedure Evaluation:  ETCO2 positive color change from yellow to purple : Yes.   Vital signs:blood pressure 173/104, pulse 83, respirations 18 and pulse oximetry 95% Patients current condition: stable Complications: No apparent complications Trach site exam: clean, dry Wound care done: 4 x 4 gauze Patient did tolerate procedure well.  Vitals at Discharge: HR-81, RR-20, BP-173/96, SpO2-96% on RA  Prescription needs: Change inner cannula twice a day per Dr Titus Mould. Paperwork sent home with pt. Call back next week to schedule next appt & we will coordinate to have swallow eval at same time.   Additional needs: Given 2 inner cannulas, backup trach same size, lots of saline bullets per pt request

## 2013-09-24 NOTE — Consult Note (Signed)
Name: Nicholas Cobb MRN: [id number] DOB: Nov 23, 1950   DATE:  09/24/2013 CONSULTATION DATE:  09/24/13  TRACHEOSTOMY CLINIC  REFERRING MD :  Dr Caryn Section in [place]:  Trach care  HISTORY OF PRESENT ILLNESS:  63 yrs old AAM, s/p tracheostomy 14 years ago by Dr Ardis Hughs for osteoradionecrosis of jaw, throat CA (1999), and GERD. He actually had an emergency trach 2 years after neck surgery and radiation. He has been seeing ENT Dr Caryn Section in [facility] but frequents [facility] for tracheostomy y issues and seeks to have a local trach care.  He uses peg normally but supplements PO with ensure at times.  Does not use PMV now. Plugs trach hand and speaks. Recent ER visit for bright red blood from trach. Uses prox 6 extra long.  Blood throat to be from suctioning, treated with saline bullets helped. After saline bullets , no further blood noted   PAST MEDICAL HISTORY :  Past Medical History  Diagnosis Date  . Hearing loss   . Change in voice   . Rash   . Trouble swallowing   . Difficulty urinating   . Cervical spondylosis 03/25/2011  . Emphysema 03/25/2011  . GERD (gastroesophageal reflux disease) 03/25/2011  . Chronic pain 03/25/2011  . 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 Xerostomia 03/25/2011  . Ulcer   . UTI (lower urinary tract infection)   . Allergy   . Chronic sinusitis 04/27/2011  . Impaired glucose tolerance 04/27/2011  . Cervical spondylolysis 04/30/2011    severe  . Cancer 1999    throat cancer  . Essential hypertension, benign 12/03/2012   Past Surgical History  Procedure Laterality Date  . Peg tube placement  11/1999  . Tracheostomy  11/1999  . Gastrostomy tube placement     Prior to Admission medications   Medication Sig Start Date End Date Taking? Authorizing Provider  amLODipine (NORVASC) 2.5 MG tablet Take 1 tablet (2.5 mg total) by mouth daily. 12/03/12   Biagio Borg, MD  ibuprofen (ADVIL,MOTRIN) 400 MG tablet Take 400 mg by mouth 3 (three) times daily as  needed for moderate pain.    Historical Provider, MD  naphazoline-pheniramine (NAPHCON-A) 0.025-0.3 % ophthalmic solution Place 2 drops into both eyes 4 (four) times daily as needed for irritation (red eye).    Historical Provider, MD  oxymetazoline (12 HOUR NASAL SPRAY) 0.05 % nasal spray Place into the nose. Place 2 sprays into both nostrils 2 (two) times daily as needed for Congestion.    Historical Provider, MD  ranitidine (ZANTAC) 150 MG tablet Take 150 mg by mouth 2 (two) times daily.    Historical Provider, MD  Sodium Chloride Flush 0.9 % SOLN injection Place 5 mLs into feeding tube every 8 (eight) hours as needed (to flush secretions). Place 75mL per tracheostomy every 8 hours as needed to flush secretions. 09/17/13   Mercedes Strupp Camprubi-Soms, PA-C   Allergies  Allergen Reactions  . Codeine Nausea And Vomiting and Rash    FAMILY HISTORY:  Family History  Problem Relation Age of Onset  . Stroke Other   . Diabetes Other   . Cancer Other     lung cancer   SOCIAL HISTORY:  reports that he quit smoking about 16 years ago. He has never used smokeless tobacco. He reports that he does not drink alcohol or use illicit drugs. lives alone, daughter is in town. 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  Famhx: noncontribution  ROS:no abdo pain, burning urination, no  weakness, eating limited as above, no headaches, no weakness No SOS unless trach gets clogged All else neg No weight loss  SUBJECTIVE: no further blood noted from tracheostomy  VITAL SIGNS:74 98% on TC RAr 14    PHYSICAL EXAMINATION: General:  Awake, talkative, no distress Neuro:  nonfocal exam HEENT:  Trach in place, no discharge, neck with radiation and surgical changes, swollen face chronic left Neck- hard structures throughout , frozen neck Cardiovascular:  s1 s2 RRR Lungs:  CTA, reduced bases Abdomen:  Peg left wnl, no r/g Musculoskeletal:  Mild lower ext edema Skin:  Dry lowers  No results found for this basename: NA, K, CL,  CO2, BUN, CREATININE, GLUCOSE,  in the last 168 hours No results found for this basename: HGB, HCT, WBC, PLT,  in the last 168 hours No results found.  ASSESSMENT / PLAN:  S/p Tracheostomy 14 years ago secondary to throat cancer followed by surgery, radiation osteoradionecrosis of jaw COPD Dysphagia  - With h/o throat ca and hemoptysis will assess pcxr, if neg and re occurs , will require CT chest: has seemed to resolve with saline bullets -also pcxr will assess extra long location to carina in setting recent bleeding -with some increase also reported secretions and ER visit July 20th - change trach  Then to 60 days for a few cycles until secretion status improves then to 90-120 days -continued saline bullets, daily inner cannula changes have failed and instruction to q12h under circumstances has been given -will see him back in 60 days - I would like a repeat swallow evaluation for the ensure he drinks, i am concerned of aspiration around the NO cuff trach, will have swallow team to next visit, will call and arrange vs prior to our visit  Lavon Paganini. Titus Mould, MD, [place] Pgr: [facility]  [facility] [place] Pager: [phone]  09/24/2013, 1:15 PM

## 2013-10-16 ENCOUNTER — Other Ambulatory Visit: Payer: Self-pay | Admitting: Internal Medicine

## 2013-10-29 ENCOUNTER — Encounter: Payer: Self-pay | Admitting: Internal Medicine

## 2013-10-29 ENCOUNTER — Ambulatory Visit (INDEPENDENT_AMBULATORY_CARE_PROVIDER_SITE_OTHER): Payer: Medicare Other | Admitting: Internal Medicine

## 2013-10-29 ENCOUNTER — Ambulatory Visit (INDEPENDENT_AMBULATORY_CARE_PROVIDER_SITE_OTHER): Payer: Medicare Other

## 2013-10-29 VITALS — BP 118/76 | HR 86 | Temp 99.3°F | Wt 169.1 lb

## 2013-10-29 DIAGNOSIS — IMO0001 Reserved for inherently not codable concepts without codable children: Secondary | ICD-10-CM

## 2013-10-29 DIAGNOSIS — R7989 Other specified abnormal findings of blood chemistry: Secondary | ICD-10-CM

## 2013-10-29 DIAGNOSIS — Z23 Encounter for immunization: Secondary | ICD-10-CM

## 2013-10-29 DIAGNOSIS — Z0000 Encounter for general adult medical examination without abnormal findings: Secondary | ICD-10-CM

## 2013-10-29 DIAGNOSIS — J018 Other acute sinusitis: Secondary | ICD-10-CM

## 2013-10-29 DIAGNOSIS — E1165 Type 2 diabetes mellitus with hyperglycemia: Secondary | ICD-10-CM

## 2013-10-29 DIAGNOSIS — R21 Rash and other nonspecific skin eruption: Secondary | ICD-10-CM | POA: Insufficient documentation

## 2013-10-29 DIAGNOSIS — J019 Acute sinusitis, unspecified: Secondary | ICD-10-CM | POA: Insufficient documentation

## 2013-10-29 DIAGNOSIS — Z930 Tracheostomy status: Secondary | ICD-10-CM

## 2013-10-29 LAB — CBC WITH DIFFERENTIAL/PLATELET
HCT: 44.1 % (ref 39.0–52.0)
HEMOGLOBIN: 14.6 g/dL (ref 13.0–17.0)
MCHC: 33 g/dL (ref 30.0–36.0)
MCV: 91.4 fl (ref 78.0–100.0)
PLATELETS: 243 10*3/uL (ref 150.0–400.0)
RBC: 4.82 Mil/uL (ref 4.22–5.81)
RDW: 14.7 % (ref 11.5–15.5)
WBC: 5.9 10*3/uL (ref 4.0–10.5)

## 2013-10-29 LAB — URINALYSIS, ROUTINE W REFLEX MICROSCOPIC
BILIRUBIN URINE: NEGATIVE
Hgb urine dipstick: NEGATIVE
Ketones, ur: NEGATIVE
Nitrite: NEGATIVE
RBC / HPF: NONE SEEN (ref 0–?)
Specific Gravity, Urine: 1.01 (ref 1.000–1.030)
Total Protein, Urine: NEGATIVE
Urine Glucose: NEGATIVE
Urobilinogen, UA: 0.2 (ref 0.0–1.0)
pH: 7 (ref 5.0–8.0)

## 2013-10-29 LAB — BASIC METABOLIC PANEL
BUN: 18 mg/dL (ref 6–23)
CALCIUM: 9.5 mg/dL (ref 8.4–10.5)
CO2: 28 mEq/L (ref 19–32)
Chloride: 98 mEq/L (ref 96–112)
Creatinine, Ser: 1.1 mg/dL (ref 0.4–1.5)
GFR: 89.02 mL/min (ref >60.00)
GLUCOSE: 91 mg/dL (ref 70–99)
Potassium: 4.9 mEq/L (ref 3.5–5.1)
Sodium: 135 mEq/L (ref 135–145)

## 2013-10-29 LAB — TSH: TSH: 2.7 u[IU]/mL (ref 0.35–4.50)

## 2013-10-29 LAB — LIPID PANEL
CHOLESTEROL: 172 mg/dL (ref 0–200)
HDL: 33.4 mg/dL — AB (ref >39.00)
NonHDL: 138.6
Total CHOL/HDL Ratio: 5
Triglycerides: 208 mg/dL — ABNORMAL HIGH (ref 0.0–149.0)
VLDL: 41.6 mg/dL — ABNORMAL HIGH (ref 0.0–40.0)

## 2013-10-29 LAB — MICROALBUMIN / CREATININE URINE RATIO
Creatinine,U: 129 mg/dL
Microalb Creat Ratio: 0.2 mg/g (ref 0.0–30.0)
Microalb, Ur: 0.3 mg/dL (ref 0.0–1.9)

## 2013-10-29 LAB — HEPATIC FUNCTION PANEL
ALBUMIN: 4.1 g/dL (ref 3.5–5.2)
ALT: 25 U/L (ref 0–53)
AST: 42 U/L — AB (ref 0–37)
Alkaline Phosphatase: 119 U/L — ABNORMAL HIGH (ref 39–117)
Bilirubin, Direct: 0 mg/dL (ref 0.0–0.3)
TOTAL PROTEIN: 7.3 g/dL (ref 6.0–8.3)
Total Bilirubin: 0.3 mg/dL (ref 0.2–1.2)

## 2013-10-29 LAB — PSA: PSA: 0.65 ng/mL (ref 0.10–4.00)

## 2013-10-29 LAB — HEMOGLOBIN A1C: HEMOGLOBIN A1C: 6.8 % — AB (ref 4.6–6.5)

## 2013-10-29 MED ORDER — CIPROFLOXACIN HCL 250 MG PO TABS: 250.0000 mg | ORAL_TABLET | Freq: Two times a day (BID) | ORAL | Status: DC

## 2013-10-29 MED ORDER — DOXYCYCLINE HYCLATE 100 MG PO TABS: 100.0000 mg | ORAL_TABLET | Freq: Two times a day (BID) | ORAL | Status: DC

## 2013-10-29 MED ORDER — TRIAMCINOLONE ACETONIDE 0.1 % EX CREA: 1.0000 "application " | TOPICAL_CREAM | Freq: Two times a day (BID) | CUTANEOUS | Status: DC

## 2013-10-29 NOTE — Assessment & Plan Note (Signed)

## 2013-10-29 NOTE — Assessment & Plan Note (Signed)
Asympt, cont diet only, wt control, f/u a1c Lab Results  Component Value Date   HGBA1C 6.9* 12/03/2012

## 2013-10-29 NOTE — Assessment & Plan Note (Addendum)
C/w allergic or irriatie, For triam cream prn,  to f/u any worsening symptoms or concerns

## 2013-10-29 NOTE — Progress Notes (Signed)
Subjective:    Patient ID: Nicholas Cobb, male    DOB: November 20, 1950, 63 y.o.   MRN: [id number]  HPI Here for wellness and f/u;  Overall doing ok;  Pt denies CP, worsening SOB, DOE, wheezing, orthopnea, PND, worsening LE edema, palpitations, dizziness or syncope.  Pt denies neurological change such as new headache, facial or extremity weakness.  Pt denies polydipsia, polyuria, or low sugar symptoms. Pt states overall good compliance with treatment and medications, good tolerability, and has been trying to follow lower cholesterol diet.  Pt denies worsening depressive symptoms, suicidal ideation or panic. No fever, night sweats, wt loss, loss of appetite, or other constitutional symptoms.  Pt states good ability with ADL's, has low fall risk, home safety reviewed and adequate, no other significant changes in hearing or vision, and only occasionally active with exercise. Did have recent doxy and cipro dual concomitant courses may 2015 for ? Sinusitis, improved in may 2015, but now symptoms returned  Here with 2-3 days acute onset fever, facial pain, pressure, headache, general weakness and malaise, and greenish d/c, with mild ST and cough.  Also some recent puffiness right foot without pain, better to elevate.  Declines flu shot, will take the prevnar.  Also has rash to right elbow area, itchy, nontender, worse in past wk with much scratching.   Asks [facility] health - [facility] , to assist with weekly trach care  Past Medical History  Diagnosis Date  . Hearing loss   . Change in voice   . Rash   . Trouble swallowing   . Difficulty urinating   . Cervical spondylosis 03/25/2011  . Emphysema 03/25/2011  . GERD (gastroesophageal reflux disease) 03/25/2011  . Chronic pain 03/25/2011  . 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 Xerostomia 03/25/2011  . Ulcer   . UTI (lower urinary tract infection)   . Allergy   . Chronic sinusitis 04/27/2011  . Impaired glucose tolerance 04/27/2011  . Cervical spondylolysis  04/30/2011    severe  . Cancer 1999    throat cancer  . Essential hypertension, benign 12/03/2012  . Type II or unspecified type diabetes mellitus without mention of complication, uncontrolled 04/27/2011   Past Surgical History  Procedure Laterality Date  . Peg tube placement  11/1999  . Tracheostomy  11/1999  . Gastrostomy tube placement      reports that he quit smoking about 16 years ago. He has never used smokeless tobacco. He reports that he does not drink alcohol or use illicit drugs. family history includes Cancer in his other; Diabetes in his other; Stroke in his other. Allergies  Allergen Reactions  . Codeine Nausea And Vomiting and Rash   Current Outpatient Prescriptions on File Prior to Visit  Medication Sig Dispense Refill  . amLODipine (NORVASC) 2.5 MG tablet Take 1 tablet (2.5 mg total) by mouth daily.  90 tablet  3  . ibuprofen (ADVIL,MOTRIN) 400 MG tablet Take 400 mg by mouth 3 (three) times daily as needed for moderate pain.      Marland Kitchen ibuprofen (ADVIL,MOTRIN) 400 MG tablet take 1 tablet by mouth three times a day if needed  90 tablet  2  . naphazoline-pheniramine (NAPHCON-A) 0.025-0.3 % ophthalmic solution Place 2 drops into both eyes 4 (four) times daily as needed for irritation (red eye).      Marland Kitchen oxymetazoline (12 HOUR NASAL SPRAY) 0.05 % nasal spray Place into the nose. Place 2 sprays into both nostrils 2 (two) times daily as needed for Congestion.      Marland Kitchen  ranitidine (ZANTAC) 150 MG tablet Take 150 mg by mouth 2 (two) times daily.      . ranitidine (ZANTAC) 150 MG tablet take 1 tablet by mouth twice a day  180 tablet  3  . Sodium Chloride Flush 0.9 % SOLN injection Place 5 mLs into feeding tube every 8 (eight) hours as needed (to flush secretions). Place 18mL per tracheostomy every 8 hours as needed to flush secretions.  5 mL  100   No current facility-administered medications on file prior to visit.   ,Review of Systems Constitutional: Negative for increased diaphoresis,  other activity, appetite or other siginficant weight change  HENT: Negative for worsening hearing loss, ear pain, facial swelling, mouth sores and neck stiffness.   Eyes: Negative for other worsening pain, redness or visual disturbance.  Respiratory: Negative for shortness of breath and wheezing.   Cardiovascular: Negative for chest pain and palpitations.  Gastrointestinal: Negative for diarrhea, blood in stool, abdominal distention or other pain Genitourinary: Negative for hematuria, flank pain or change in urine volume.  Musculoskeletal: Negative for myalgias or other joint complaints.  Skin: Negative for color change and wound.  Neurological: Negative for syncope and numbness. other than noted Hematological: Negative for adenopathy. or other swelling Psychiatric/Behavioral: Negative for hallucinations, self-injury, decreased concentration or other worsening agitation.      Objective:   Physical Exam BP 118/76  Pulse 86  Temp(Src) 99.3 F (37.4 C) (Oral)  Wt 169 lb 2 oz (76.715 kg)  SpO2 94% VS noted, trach in place Constitutional: Pt is oriented to person, place, and time. Appears well-developed and well-nourished.  Head: Normocephalic and atraumatic.  Right Ear: External ear normal.  Left Ear: External ear normal.  Nose: Nose normal.  Mouth/Throat: Oropharynx is clear and moist.  Bilat tm's with mild erythema.  Max sinus areas mild tender right maxillary marked sweling, tender .  Pharynx with mild erythema, no exudate Eyes: Conjunctivae and EOM are normal. Pupils are equal, round, and reactive to light.  Neck: Normal range of motion. Neck supple. No JVD present. No tracheal deviation present.  Cardiovascular: Normal rate, regular rhythm, normal heart sounds and intact distal pulses.   Pulmonary/Chest: Effort normal and breath sounds without rales or wheezing  Abdominal: Soft. Bowel sounds are normal. NT. No HSM  Musculoskeletal: Normal range of motion. Exhibits no edema.    Lymphadenopathy:  Has no cervical adenopathy.  Neurological: Pt is alert and oriented to person, place, and time. Pt has normal reflexes. No cranial nerve deficit. Motor grossly intact Skin: Skin is warm and dry.Right lateral  elbow rash nontender erythem noted apprxo 3 cm area  Psychiatric:  Has mild nervous mood and affect. Behavior is normal.      Assessment & Plan:

## 2013-10-29 NOTE — Progress Notes (Signed)
Pre visit review using our clinic review tool, if applicable. No additional management support is needed unless otherwise documented below in the visit note. 

## 2013-10-29 NOTE — Patient Instructions (Addendum)
You had the prevnar pneumonia shot today  Please take all new medication as prescribed - the antibiotics, and the cream for the rash  You will be contacted regarding the referral for: [facility] for the trach care weekly at home  Please continue all other medications as before, and refills have been done if requested.  Please have the pharmacy call with any other refills you may need.  Please continue your efforts at being more active, low cholesterol diet, and weight control.  You are otherwise up to date with prevention measures today.  Please keep your appointments with your specialists as you may have planned  Please go to the LAB in the Basement (turn left off the elevator) for the tests to be done today  You will be contacted by phone if any changes need to be made immediately.  Otherwise, you will receive a letter about your results with an explanation, but please check with MyChart first.  Please remember to sign up for MyChart if you have not done so, as this will be important to you in the future with finding out test results, communicating by private email, and scheduling acute appointments online when needed.  Please return in 6 months, or sooner if needed, with Lab testing done 3-5 days before

## 2013-10-29 NOTE — Assessment & Plan Note (Signed)
For doxy/cipro repeat course,  to f/u any worsening symptoms or concerns

## 2013-10-29 NOTE — Addendum Note (Signed)
Addended by: Sharon Seller B on: 10/29/2013 11:19 AM   Modules accepted: Orders

## 2013-10-30 ENCOUNTER — Encounter: Payer: Self-pay | Admitting: Internal Medicine

## 2013-10-30 LAB — LDL CHOLESTEROL, DIRECT: Direct LDL: 116 mg/dL

## 2013-11-10 ENCOUNTER — Telehealth: Payer: Self-pay | Admitting: Internal Medicine

## 2013-11-10 NOTE — Telephone Encounter (Signed)
Received order for trac care.  They do not do this .  Also went out to home and no one was there.  Not sure if patient is homebound or not.

## 2013-11-11 NOTE — Telephone Encounter (Signed)
Information reviewed  I will hold on any referral for weekly trach inspection, which is what he wants, but is not needed at this time

## 2013-11-11 NOTE — Telephone Encounter (Signed)
[employer] called Roderic Ovens case manager for this patient, her number is [phone] ext. [phone]) and will find an agency that can come out to the home for Trac care and will call me back (leave detailed message is needed on my phone) of agency name and contact information, I will then give info. To PCP who then can refer patient. At this time will close note until hear back from [place].

## 2013-11-11 NOTE — Telephone Encounter (Signed)
[employer] Case Manager Roderic Ovens called back.  She gave (3) names of home health company's that do Trac Care.  Please refer to one of these below for this patient  [place]  [place]

## 2013-11-11 NOTE — Telephone Encounter (Signed)
Additional information.  Received a phone call from [place].  She went to the home today to evaluate this patient and spent close to 45 mins. In the home.  She stated the patient will not be able to received Home health for 2 reasons, he is not home bound and there is no skilled nursing need at this time.  The patient verbally did explain and understands his Trac Care.  HHRN does think he will not qualify at all for any home health company's due to these reasons.  The only program in [place] is through medicaid that does come out daily, but this patient does not have medicaid. HHRN stated the patient "wants someone to come out weekly to just look at his Trac" and that is all.

## 2013-12-11 ENCOUNTER — Encounter: Payer: Self-pay | Admitting: *Deleted

## 2013-12-17 ENCOUNTER — Ambulatory Visit (HOSPITAL_COMMUNITY)
Admission: RE | Admit: 2013-12-17 | Discharge: 2013-12-17 | Disposition: A | Discharge status: Home / Self Care | Payer: Medicare Other | Source: Ambulatory Visit | Attending: Acute Care | Admitting: Acute Care

## 2013-12-17 DIAGNOSIS — Z930 Tracheostomy status: Secondary | ICD-10-CM

## 2013-12-17 NOTE — Progress Notes (Signed)
Tracheostomy Procedure Note  KALDEN WANKE [id number] 1950/11/21  Pre Procedure Tracheostomy Information  Trach Brand: Shiley Size: 6.0 XLT Style: Proximal and Uncuffed Secured by: Velcro   Procedure:  Trach change and cleaning     Post Procedure Tracheostomy Information  Trach Brand: Shiley Size: 6.0 XLT Style: Proximal and Uncuffed Secured by: Velcro   Post Procedure Evaluation:  ETCO2 positive color change from yellow to purple : Yes.   Vital signs:blood pressure 152/84   pulse 87, respirations 18 and pulse oximetry 92% on RA.  Baseline before trach change Patients current condition: stable Complications: No apparent complications Trach site exam: clean and dry Wound care done: 4 x 4 gauze Patient did tolerate procedure well.  Procedure tolerated well.. Vitals 5 minutes post trach change 151/98  HR 88  94% saturation RR 20  On Room Air Vitals 15 minutes later prior to discharge  150/89  HR 88  O2 saturation 96% on RA  RR 18   Education: None needed  Prescription needs: None needed    Additional needs: None needed

## 2013-12-17 NOTE — Progress Notes (Signed)
CC:  Routine trach change   HPI:  63 yrs old AAM, s/p tracheostomy 14 years ago by Dr Ardis Hughs for osteoradionecrosis of jaw, throat CA (1999), and GERD. He actually had an emergency trach 2 years after neck surgery and radiation. He uses peg normally but supplements PO with ensure at times. Does not use PMV now. Plugs trach hand and speaks. Uses prox 6 extra long.Presents to [facility] for routine change.  Brief History   Past Medical History  Diagnosis Date  . Hearing loss   . Change in voice   . Rash   . Trouble swallowing   . Difficulty urinating   . Cervical spondylosis 03/25/2011  . Emphysema 03/25/2011  . GERD (gastroesophageal reflux disease) 03/25/2011  . Chronic pain 03/25/2011  . 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 Xerostomia 03/25/2011  . Ulcer   . UTI (lower urinary tract infection)   . Allergy   . Chronic sinusitis 04/27/2011  . Impaired glucose tolerance 04/27/2011  . Cervical spondylolysis 04/30/2011    severe  . Cancer 1999    throat cancer  . Essential hypertension, benign 12/03/2012  . Type II or unspecified type diabetes mellitus without mention of complication, uncontrolled 04/27/2011  . Depression    Family History  Problem Relation Age of Onset  . Stroke Other   . Diabetes Other   . Cancer Other     lung cancer   Allergies  Allergen Reactions  . Codeine Nausea And Vomiting and Rash   History   Social History  . Marital Status: Single    Spouse Name: N/A    Number of Children: N/A  . Years of Education: 12   Occupational History  . retired    Social History Main Topics  . Smoking status: Former Smoker    Quit date: 11/06/1996  . Smokeless tobacco: Never Used  . Alcohol Use: No  . Drug Use: No  . Sexual Activity: Not on file   Other Topics Concern  . Not on file   Social History Narrative   Pt. Lives in a apartment, 1 story, 2 people live in the home, No pet's. Pt. Does exercise (walk) and yes pt. Has a living will.    @[employer] @  Review of Systems:   Bolds are positive  Constitutional: weight loss, gain, night sweats, Fevers, chills, fatigue .  HEENT: headaches, Sore throat, sneezing, nasal congestion, post nasal drip, Difficulty swallowing, Tooth/dental problems, visual complaints visual changes, ear ache CV:  chest pain, radiates: ,Orthopnea, PND, swelling in lower extremities, dizziness, palpitations, syncope.  GI  heartburn, indigestion, abdominal pain, nausea, vomiting, diarrhea, change in bowel habits, loss of appetite, bloody stools.  Resp: cough, productive: , hemoptysis, dyspnea, chest pain, pleuritic.  MS: joint pain or swelling. decreased range of motion  Psych: change in mood or affect. depression or anxiety.   91% Room air.  HR: 96 BP: 150/87  General appearance:  63 Year old AAM Mouth:  membranes and no mucosal ulcerations; neck w/ limited mobility and jaw w/ post-op chronic deformity  Neck: Trachea midline, # 6 trach, uncuffed trach stoma crusted but unremarkable.  Lungs: CTA, with normal respiratory effort and no intercostal retractions CV: RRR, no MRGs  Abdomen: Soft, non-tender; no masses or HSM Extremities: No peripheral edema or extremity lymphadenopathy Skin: Normal temperature, turgor and texture; no rash, ulcers or subcutaneous nodules Psych: Appropriate affect, alert and oriented to person, place and time  Date  Last Seen by:  Lurline Idol size Lurline Idol  change   pmv  Capping  Stoma assessment  Diet  Goals   11/3 [place]cock shiley [id number] (6 prox XLT) yes No, uses finger occlusion  n/a Unremarkable  PEG w/ oral supplementation  rov 3 months                         Tracheostomy status in setting of head and neck cancer.  Trach changed  (6 prox XLT) w/out issue.  Plan ROV 3 months.  Call PRN    Trach changed today, RTC in 3 months for further trach change/care, no decannulation given cancer.  Patient seen and examined, agree with above note.  I dictated the care and orders  written for this patient under my direction.  Rush Farmer, MD [phone]

## 2014-01-20 ENCOUNTER — Other Ambulatory Visit: Payer: Self-pay | Admitting: Internal Medicine

## 2014-01-29 ENCOUNTER — Other Ambulatory Visit: Payer: Self-pay | Admitting: Internal Medicine

## 2014-02-19 ENCOUNTER — Ambulatory Visit: Payer: Medicare Other | Admitting: Internal Medicine

## 2014-03-05 ENCOUNTER — Encounter: Payer: Self-pay | Admitting: Nurse Practitioner

## 2014-03-05 ENCOUNTER — Inpatient Hospital Stay (HOSPITAL_COMMUNITY): Admission: RE | Admit: 2014-03-05 | Payer: Self-pay | Source: Ambulatory Visit

## 2014-03-05 ENCOUNTER — Ambulatory Visit (INDEPENDENT_AMBULATORY_CARE_PROVIDER_SITE_OTHER): Payer: Medicare Other | Admitting: Nurse Practitioner

## 2014-03-05 VITALS — BP 132/80 | HR 53 | Temp 98.9°F | Ht 69.0 in | Wt 169.0 lb

## 2014-03-05 DIAGNOSIS — C329 Malignant neoplasm of larynx, unspecified: Secondary | ICD-10-CM

## 2014-03-05 DIAGNOSIS — Z930 Tracheostomy status: Secondary | ICD-10-CM

## 2014-03-05 DIAGNOSIS — I10 Essential (primary) hypertension: Secondary | ICD-10-CM

## 2014-03-05 DIAGNOSIS — R609 Edema, unspecified: Secondary | ICD-10-CM

## 2014-03-05 DIAGNOSIS — Z431 Encounter for attention to gastrostomy: Secondary | ICD-10-CM

## 2014-03-05 DIAGNOSIS — G8929 Other chronic pain: Secondary | ICD-10-CM

## 2014-03-05 DIAGNOSIS — J329 Chronic sinusitis, unspecified: Secondary | ICD-10-CM

## 2014-03-05 DIAGNOSIS — K219 Gastro-esophageal reflux disease without esophagitis: Secondary | ICD-10-CM

## 2014-03-05 DIAGNOSIS — R21 Rash and other nonspecific skin eruption: Secondary | ICD-10-CM

## 2014-03-05 MED ORDER — DOXYCYCLINE HYCLATE 100 MG PO TABS: 100.0000 mg | ORAL_TABLET | Freq: Two times a day (BID) | ORAL | Status: DC

## 2014-03-05 MED ORDER — TRIAMCINOLONE ACETONIDE 0.1 % EX CREA: 1.0000 "application " | TOPICAL_CREAM | Freq: Two times a day (BID) | CUTANEOUS | Status: DC

## 2014-03-05 NOTE — Patient Instructions (Signed)
Please take doxycyline 100 mg twice daily by mouth for 10 days for increased congestion and sputum  Will get blood work today  To follow up in 2 weeks for EV for physical and will get records in the meantime-- please sign record release  If symptoms worsen go immediately to ED

## 2014-03-05 NOTE — Progress Notes (Signed)
Patient ID: Nicholas Cobb, male   DOB: April 18, 1950, 64 y.o.   MRN: [id number]    PCP: [name], NP  Allergies  Allergen Reactions  . Codeine Nausea And Vomiting and Rash    Chief Complaint  Patient presents with  . Establish Care    New patient, establish care.   . Medication Management    Refill cream (see medication list)  . URI    Patient c/o congestion 4-5 days  . Leg Swelling    Patient c/o right leg swelling, ongoing concern      HPI: Patient is a 64 y.o. male seen in the office today to establish visit. Pt with a hx of throat cancer and is s/p radiation with trach. Making communication difficult. Reports he was previously seen by Dr Jenny Reichmann but would like to change PCPs. Currently being followed at [facility].  Reports increased sinus and trach congestion. Worsening shortness of breath and using his suction catheters more. Pt with hx of chronic sinusitis.  Pt also reports worsening right leg swelling. No injury noted. Has been swollen for many months. Pt denies pain.  Review of Systems:  Review of Systems  Constitutional: Negative for fever, chills and activity change.  HENT: Positive for congestion, drooling, facial swelling (chronic right sided facial swelling after radiation), postnasal drip, rhinorrhea, sinus pressure and trouble swallowing (has PEG tube for feeding).   Respiratory: Positive for cough and shortness of breath (at times). Negative for wheezing.   Cardiovascular: Positive for leg swelling (right leg edema). Negative for chest pain.  Gastrointestinal: Negative for abdominal pain and abdominal distention.  Genitourinary: Negative for difficulty urinating.  Musculoskeletal: Negative for myalgias and arthralgias.  Neurological: Positive for facial asymmetry (chronic after radiation) and speech difficulty. Negative for dizziness.    Past Medical History  Diagnosis Date  . Hearing loss   . Change in voice   . Rash   . Trouble swallowing   .  Difficulty urinating   . Cervical spondylosis 03/25/2011  . Emphysema 03/25/2011  . GERD (gastroesophageal reflux disease) 03/25/2011  . Chronic pain 03/25/2011  . 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 Xerostomia 03/25/2011  . Ulcer   . UTI (lower urinary tract infection)   . Allergy   . Chronic sinusitis 04/27/2011  . Impaired glucose tolerance 04/27/2011  . Cervical spondylolysis 04/30/2011    severe  . Cancer 1999    throat cancer  . Essential hypertension, benign 12/03/2012  . Type II or unspecified type diabetes mellitus without mention of complication, uncontrolled 04/27/2011  . Depression    Past Surgical History  Procedure Laterality Date  . Peg tube placement  11/1999  . Tracheostomy  11/1999  . Gastrostomy tube placement     Social History:   reports that he quit smoking about 17 years ago. He has never used smokeless tobacco. He reports that he drinks alcohol. He reports that he does not use illicit drugs.  Family History  Problem Relation Age of Onset  . Stroke Other   . Diabetes Other   . Cancer Other     lung cancer    Medications: Patient's Medications  New Prescriptions   No medications on file  Previous Medications   AMLODIPINE (NORVASC) 2.5 MG TABLET    take 1 tablet by mouth once daily   IBUPROFEN (ADVIL,MOTRIN) 400 MG TABLET    take 1 tablet by mouth three times a day if needed   NAPHAZOLINE-PHENIRAMINE (NAPHCON-A) 0.025-0.3 % OPHTHALMIC SOLUTION  Place 2 drops into both eyes 4 (four) times daily as needed for irritation (red eye).   OXYMETAZOLINE (12 HOUR NASAL SPRAY) 0.05 % NASAL SPRAY    Place into the nose. Place 2 sprays into both nostrils 2 (two) times daily as needed for Congestion.   RANITIDINE (ZANTAC) 150 MG TABLET    Take 150 mg by mouth 2 (two) times daily.   TRIAMCINOLONE CREAM (KENALOG) 0.1 %    Apply 1 application topically 2 (two) times daily.  Modified Medications   No medications on file  Discontinued Medications   CIPROFLOXACIN (CIPRO) 250  MG TABLET    Take 1 tablet (250 mg total) by mouth 2 (two) times daily.   DOXYCYCLINE (VIBRA-TABS) 100 MG TABLET    Take 1 tablet (100 mg total) by mouth 2 (two) times daily.   IBUPROFEN (ADVIL,MOTRIN) 400 MG TABLET    Take 400 mg by mouth 3 (three) times daily as needed for moderate pain.     Physical Exam:  Filed Vitals:   03/05/14 1329  BP: 132/80  Pulse: 53  Temp: 98.9 F (37.2 C)  TempSrc: Oral  Height: 5\' 9"  (1.753 m)  Weight: 169 lb (76.658 kg)  SpO2: 95%    Physical Exam  Constitutional: He is oriented to person, place, and time. He appears well-developed and well-nourished. No distress.  HENT:  Head: Normocephalic and atraumatic.  Mouth/Throat: Oropharynx is clear and moist. No oropharyngeal exudate.  Eyes: Conjunctivae and EOM are normal. Pupils are equal, round, and reactive to light.  Neck: Normal range of motion.  Trach intact  Cardiovascular: Normal rate, regular rhythm and normal heart sounds.   Pulmonary/Chest: Effort normal. He has rhonchi in the right upper field and the left upper field. He exhibits tenderness.  Abdominal: Soft. Bowel sounds are normal.  PEG tube  Musculoskeletal: He exhibits edema and tenderness.  Slight redness, edema and tenderness to right LE  Neurological: He is alert and oriented to person, place, and time.  Skin: Skin is warm and dry. He is not diaphoretic.  Psychiatric: He has a normal mood and affect.    Labs reviewed: Basic Metabolic Panel:  Recent Labs  03/26/13 1829 05/17/13 0240 10/29/13 1118  NA 137 135* 135  K 4.8 4.5 4.9  CL 94* 95* 98  CO2 29  --  28  GLUCOSE 117* 111* 91  BUN 16 13 18   CREATININE 0.86 1.00 1.1  CALCIUM 9.9  --  9.5  TSH  --   --  2.70   Liver Function Tests:  Recent Labs  10/29/13 1118  AST 42*  ALT 25  ALKPHOS 119*  BILITOT 0.3  PROT 7.3  ALBUMIN 4.1   No results for input(s): LIPASE, AMYLASE in the last 8760 hours. No results for input(s): AMMONIA in the last 8760  hours. CBC:  Recent Labs  03/26/13 1829 05/17/13 0117 05/17/13 0240 10/29/13 1118  WBC 7.3 6.2  --  5.9  NEUTROABS 4.8  --   --   --   HGB 15.3 15.3 16.3 14.6  HCT 44.5 43.7 48.0 44.1  MCV 90.8 89.9  --  91.4  PLT 296 283  --  243.0   Lipid Panel:  Recent Labs  10/29/13 1118  CHOL 172  HDL 33.40*  TRIG 208.0*  CHOLHDL 5  LDLDIRECT 116.0   TSH:  Recent Labs  10/29/13 1118  TSH 2.70   A1C: Lab Results  Component Value Date   HGBA1C 6.8* 10/29/2013  Assessment/Plan 1. Gastroesophageal reflux disease without esophagitis conts on zantac via PEG BID  2. Laryngeal cancer S/p radation and trach, following with [facility], unsure if he is still following with oncology   3. Essential hypertension, benign Stable on norvasc 2.5 mg - Comprehensive metabolic panel  4. Edema Maybe related to norvasc however due to the fact it is primarily on right side with get LE doppler rule out DVT - Lower Extremity Venous Duplex RIGHT; Future  5. Chronic sinusitis, unspecified location -acute on chronic, will start doxycyline 100 mg via tube BID for 10 days, educated to seek emergency treatment if symptoms worse - CBC With differential/Platelet  6. Encounter for PEG (percutaneous endoscopic gastrostomy) Stable, seeing Dr Kermit Balo   7. Tracheostomy in place Follows with [facility], will request records  8. Rash -conts on triamcinolone as needed   9 . Chronic pain Uses advil TID scheduled. Due to pts age and risk for adverse effects advised against chronic use of Advil. Will need to reinforce at next visit. Will check renal function today   To follow up in 2 weeks for EV with MMSE and follow up on sinusitis

## 2014-03-06 ENCOUNTER — Encounter: Payer: Self-pay | Admitting: Nurse Practitioner

## 2014-03-12 ENCOUNTER — Ambulatory Visit (HOSPITAL_COMMUNITY)
Admission: RE | Admit: 2014-03-12 | Discharge: 2014-03-12 | Disposition: A | Discharge status: Home / Self Care | Payer: Medicare Other | Source: Ambulatory Visit | Attending: Nurse Practitioner | Admitting: Nurse Practitioner

## 2014-03-12 ENCOUNTER — Telehealth: Payer: Self-pay | Admitting: *Deleted

## 2014-03-12 DIAGNOSIS — C329 Malignant neoplasm of larynx, unspecified: Secondary | ICD-10-CM | POA: Diagnosis present

## 2014-03-12 DIAGNOSIS — R609 Edema, unspecified: Secondary | ICD-10-CM | POA: Insufficient documentation

## 2014-03-12 NOTE — Progress Notes (Signed)
Bilateral lower extremity venous duplex completed:  No evidence of DVT, superficial thrombosis, or Baker's cyst.   

## 2014-03-12 NOTE — Telephone Encounter (Signed)
[facility] called and stated that patient's doppler was Negative and will fax final results. Sherrie Mustache Notified.

## 2014-03-19 ENCOUNTER — Ambulatory Visit (HOSPITAL_COMMUNITY)
Admission: RE | Admit: 2014-03-19 | Discharge: 2014-03-19 | Disposition: A | Discharge status: Home / Self Care | Payer: Medicare Other | Source: Ambulatory Visit | Attending: Acute Care | Admitting: Acute Care

## 2014-03-19 DIAGNOSIS — Y842 Radiological procedure and radiotherapy as the cause of abnormal reaction of the patient, or of later complication, without mention of misadventure at the time of the procedure: Secondary | ICD-10-CM | POA: Diagnosis not present

## 2014-03-19 DIAGNOSIS — M272 Inflammatory conditions of jaws: Secondary | ICD-10-CM | POA: Insufficient documentation

## 2014-03-19 DIAGNOSIS — Z430 Encounter for attention to tracheostomy: Secondary | ICD-10-CM | POA: Diagnosis present

## 2014-03-19 DIAGNOSIS — Z85819 Personal history of malignant neoplasm of unspecified site of lip, oral cavity, and pharynx: Secondary | ICD-10-CM | POA: Insufficient documentation

## 2014-03-19 DIAGNOSIS — K219 Gastro-esophageal reflux disease without esophagitis: Secondary | ICD-10-CM | POA: Diagnosis not present

## 2014-03-19 DIAGNOSIS — Z931 Gastrostomy status: Secondary | ICD-10-CM | POA: Insufficient documentation

## 2014-03-19 DIAGNOSIS — Z930 Tracheostomy status: Secondary | ICD-10-CM

## 2014-03-19 NOTE — Progress Notes (Signed)
Tracheostomy Procedure Note  Nicholas Cobb [id number] 1950-11-11  Pre Procedure Tracheostomy Information  Trach Brand: Shiley Size: 6.0 XLT UP Style: Uncuffed  Secured by: Velcro   Procedure: trach cleaning and change    Post Procedure Tracheostomy Information  Trach Brand: Shiley Size: 6.0 Style: Uncuffed Secured by: Velcro   Post Procedure Evaluation:  ETCO2 positive color change from yellow to purple : Yes.   Vital signs:blood pressure 137/89 , pulse 74, respirations 18 and pulse oximetry 95% Patients current condition: stable Complications: No apparent complications Trach site exam: clean Wound care done: clean Patient did tolerate procedure well.  Vital post trach change  BP 140/92  RR 17  HR 77  O2 sats 97 % on RA  Pt monitored for 15 minutes BP 140/85  RR 18 HR 81  O2sats 97% on RA Education:  None needed  Prescription needs: None needed    Additional needs: none

## 2014-03-19 NOTE — Progress Notes (Signed)
     CC: Routine trach change   HPI:  64 yrs old AAM, s/p tracheostomy 14 years ago by Dr Ardis Hughs for osteoradionecrosis of jaw, throat CA (1999), and GERD. He actually had an emergency trach 2 years after neck surgery and radiation. He uses peg normally but supplements PO with ensure at times. Does not use PMV now. Plugs trach hand and speaks. Uses prox 6 extra long.Presents to [place] for routine change. Last visit 11/3  Review of Systems:  No acute issues   General appearance: 64 Year old AAM Mouth: membranes and no mucosal ulcerations; neck w/ limited mobility and jaw w/ post-op chronic deformity  Neck: Trachea midline, # 6 trach, uncuffed trach stoma crusted but unremarkable.  Lungs: CTA, with normal respiratory effort and no intercostal retractions CV: RRR, no MRGs  Abdomen: Soft, non-tender; no masses or HSM Extremities: No peripheral edema or extremity lymphadenopathy Skin: Normal temperature, turgor and texture; no rash, ulcers or subcutaneous nodules Psych: Appropriate affect, alert and oriented to person, place and time  Date  Last Seen by:  Lurline Idol size Trach change  pmv  Capping  Stoma assessment  Diet  Goals   11/3 Charis Juliana shiley 6OXLTUP (6 prox XLT) yes No, uses finger occlusion  n/a Unremarkable  PEG w/ oral supplementation  rov 3 months   2/4 Lavora Brisbon shiley 6OXLTUP (6 prox XLT) yes Finger occlusion  Na  Unremarkable, but seems as though facial swelling worse   ROV 3 months               Tracheostomy status in setting of head and neck cancer. Trach changed (6 prox XLT) w/out issue.  Plan ROV 3 months.  Call PRN   [name] ACNP-BC [place] Pager # [phone] OR # [phone] if no answer

## 2014-03-23 ENCOUNTER — Other Ambulatory Visit: Payer: Medicare Other

## 2014-03-24 ENCOUNTER — Other Ambulatory Visit: Payer: Medicare Other

## 2014-03-24 ENCOUNTER — Other Ambulatory Visit: Payer: Self-pay | Admitting: *Deleted

## 2014-03-24 DIAGNOSIS — I10 Essential (primary) hypertension: Secondary | ICD-10-CM

## 2014-03-24 NOTE — Addendum Note (Signed)
Addended by: Eilene Ghazi on: 03/24/2014 08:26 AM   Modules accepted: Orders

## 2014-03-24 NOTE — Addendum Note (Signed)
Addended by: Jearld Adjutant on: 03/24/2014 08:35 AM   Modules accepted: Orders

## 2014-03-25 LAB — CBC WITH DIFFERENTIAL/PLATELET
BASOS ABS: 0 10*3/uL (ref 0.0–0.2)
Basos: 0 %
EOS ABS: 0.1 10*3/uL (ref 0.0–0.4)
Eos: 3 %
HEMATOCRIT: 42.5 % (ref 37.5–51.0)
Hemoglobin: 14.7 g/dL (ref 12.6–17.7)
Immature Grans (Abs): 0 10*3/uL (ref 0.0–0.1)
Immature Granulocytes: 0 %
Lymphocytes Absolute: 1.3 10*3/uL (ref 0.7–3.1)
Lymphs: 31 %
MCH: 31.1 pg (ref 26.6–33.0)
MCHC: 34.6 g/dL (ref 31.5–35.7)
MCV: 90 fL (ref 79–97)
MONOCYTES: 18 %
Monocytes Absolute: 0.8 10*3/uL (ref 0.1–0.9)
NEUTROS ABS: 2 10*3/uL (ref 1.4–7.0)
Neutrophils Relative %: 48 %
Platelets: 249 10*3/uL (ref 150–379)
RBC: 4.72 x10E6/uL (ref 4.14–5.80)
RDW: 14.6 % (ref 12.3–15.4)
WBC: 4.2 10*3/uL (ref 3.4–10.8)

## 2014-03-25 LAB — COMPREHENSIVE METABOLIC PANEL
A/G RATIO: 1.5 (ref 1.1–2.5)
ALBUMIN: 4.2 g/dL (ref 3.6–4.8)
ALK PHOS: 118 IU/L — AB (ref 39–117)
ALT: 23 IU/L (ref 0–44)
AST: 30 IU/L (ref 0–40)
BILIRUBIN TOTAL: 0.4 mg/dL (ref 0.0–1.2)
BUN/Creatinine Ratio: 19 (ref 10–22)
BUN: 19 mg/dL (ref 8–27)
CO2: 28 mmol/L (ref 18–29)
Calcium: 9.5 mg/dL (ref 8.6–10.2)
Chloride: 94 mmol/L — ABNORMAL LOW (ref 97–108)
Creatinine, Ser: 1.01 mg/dL (ref 0.76–1.27)
GFR calc non Af Amer: 77 mL/min/{1.73_m2} (ref >59)
GFR, EST AFRICAN AMERICAN: 89 mL/min/{1.73_m2} (ref >59)
GLUCOSE: 77 mg/dL (ref 65–99)
Globulin, Total: 2.8 g/dL (ref 1.5–4.5)
POTASSIUM: 4.7 mmol/L (ref 3.5–5.2)
Sodium: 136 mmol/L (ref 134–144)
TOTAL PROTEIN: 7 g/dL (ref 6.0–8.5)

## 2014-03-26 ENCOUNTER — Ambulatory Visit (INDEPENDENT_AMBULATORY_CARE_PROVIDER_SITE_OTHER): Payer: Medicare Other | Admitting: Nurse Practitioner

## 2014-03-26 ENCOUNTER — Encounter: Payer: Self-pay | Admitting: Nurse Practitioner

## 2014-03-26 VITALS — BP 140/84 | HR 87 | Temp 98.7°F | Resp 10 | Ht 69.0 in | Wt 168.8 lb

## 2014-03-26 DIAGNOSIS — C329 Malignant neoplasm of larynx, unspecified: Secondary | ICD-10-CM

## 2014-03-26 DIAGNOSIS — R195 Other fecal abnormalities: Secondary | ICD-10-CM

## 2014-03-26 DIAGNOSIS — I10 Essential (primary) hypertension: Secondary | ICD-10-CM

## 2014-03-26 DIAGNOSIS — G8929 Other chronic pain: Secondary | ICD-10-CM

## 2014-03-26 DIAGNOSIS — R609 Edema, unspecified: Secondary | ICD-10-CM

## 2014-03-26 DIAGNOSIS — J329 Chronic sinusitis, unspecified: Secondary | ICD-10-CM

## 2014-03-26 NOTE — Patient Instructions (Signed)
Recommend stopping ibuprofen, may use Tylenol 650 mg by mouth three times daily  May use probiotic- florastor twice daily for stomach  Follow up in 3 months for physical

## 2014-03-26 NOTE — Progress Notes (Signed)
Patient ID: Nicholas Cobb, male   DOB: 1950-07-13, 64 y.o.   MRN: [id number]    PCP: [name], NP  Allergies  Allergen Reactions  . Codeine Nausea And Vomiting and Rash    Chief Complaint  Patient presents with  . Annual Exam    Yearly check-up, discuss labs (copy printed), patient scored 28/30 on MMSE   . Referral    Eye doctor referral     HPI: Patient is a 64 y.o. male seen in the office today to finish intake from new pt visit and follow up on sinusitis.  Pt reports secretions are some better from last visit. Was treated with doxycyline. Reports he is planning to follow up with ENT in [place], going in April. Reports he plans on going an will have hyperbaric treatment which helps the secretions from the radiation damage. Reports he got too much radiation after his surgery for throat cancer.  Denies shortness of breath, or cough, no chest pain. Denies fever or malaise.  Review of Systems:  Review of Systems  Constitutional: Negative for fever, chills and activity change.  HENT: Positive for congestion, drooling, facial swelling (chronic right sided facial swelling after radiation), postnasal drip, rhinorrhea and trouble swallowing (has PEG tube for feeding). Negative for sinus pressure.   Respiratory: Negative for cough, shortness of breath (at times) and wheezing.   Cardiovascular: Positive for leg swelling (right leg edema). Negative for chest pain.  Gastrointestinal: Negative for abdominal pain and abdominal distention.  Genitourinary: Positive for frequency. Negative for difficulty urinating.  Musculoskeletal: Negative for myalgias and arthralgias.  Neurological: Positive for facial asymmetry (chronic after radiation) and speech difficulty (has trach). Negative for dizziness.    Past Medical History  Diagnosis Date  . Hearing loss   . Change in voice   . Rash   . Trouble swallowing   . Difficulty urinating   . Cervical spondylosis 03/25/2011  . Emphysema  03/25/2011  . GERD (gastroesophageal reflux disease) 03/25/2011  . Chronic pain 03/25/2011  . 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 Xerostomia 03/25/2011  . Ulcer   . UTI (lower urinary tract infection)   . Allergy   . Chronic sinusitis 04/27/2011  . Impaired glucose tolerance 04/27/2011  . Cervical spondylolysis 04/30/2011    severe  . Cancer 1999    throat cancer  . Essential hypertension, benign 12/03/2012  . Type II or unspecified type diabetes mellitus without mention of complication, uncontrolled 04/27/2011  . Depression    Past Surgical History  Procedure Laterality Date  . Peg tube placement  11/1999  . Tracheostomy  11/1999  . Gastrostomy tube placement     Social History:   reports that he quit smoking about 17 years ago. He has never used smokeless tobacco. He reports that he does not drink alcohol or use illicit drugs.  Family History  Problem Relation Age of Onset  . Stroke Other   . Diabetes Other   . Cancer Other     lung cancer  . Diabetes Mother   . Heart disease Father     Medications: Patient's Medications  New Prescriptions   No medications on file  Previous Medications   AMLODIPINE (NORVASC) 2.5 MG TABLET    take 1 tablet by mouth once daily   IBUPROFEN (ADVIL,MOTRIN) 400 MG TABLET    Take 400 mg by mouth 3 (three) times daily.   NAPHAZOLINE-PHENIRAMINE (NAPHCON-A) 0.025-0.3 % OPHTHALMIC SOLUTION    Place 2 drops into both eyes 4 (four)  times daily as needed for irritation (red eye).   NUTRITIONAL SUPPLEMENTS (FEEDING SUPPLEMENT, JEVITY 1.2 CAL,) LIQD    Place 237 mLs into feeding tube daily. Pt takes 2 cans at breakfast, 2 cans at lunch, 2 cans at dinner and 2 at night   OXYMETAZOLINE (12 HOUR NASAL SPRAY) 0.05 % NASAL SPRAY    Place into the nose. Place 2 sprays into both nostrils 2 (two) times daily as needed for Congestion.   RANITIDINE (ZANTAC) 150 MG TABLET    Take 150 mg by mouth 2 (two) times daily.   TRIAMCINOLONE CREAM (KENALOG) 0.1 %    Apply 1  application topically 2 (two) times daily.  Modified Medications   No medications on file  Discontinued Medications   DOXYCYCLINE (VIBRA-TABS) 100 MG TABLET    Take 1 tablet (100 mg total) by mouth 2 (two) times daily.   IBUPROFEN (ADVIL,MOTRIN) 400 MG TABLET    take 1 tablet by mouth three times a day if needed     Physical Exam:  Filed Vitals:   03/26/14 1013  BP: 140/84  Pulse: 87  Temp: 98.7 F (37.1 C)  TempSrc: Oral  Resp: 10  Height: 5\' 9"  (1.753 m)  Weight: 168 lb 12.8 oz (76.567 kg)  SpO2: 91%    Physical Exam  Constitutional: He is oriented to person, place, and time. He appears well-developed and well-nourished. No distress.  HENT:  Head: Normocephalic and atraumatic.  Mouth/Throat: Oropharynx is clear and moist. No oropharyngeal exudate.  Eyes: Conjunctivae and EOM are normal. Pupils are equal, round, and reactive to light.  Neck: Normal range of motion.  Trach intact  Cardiovascular: Normal rate, regular rhythm and normal heart sounds.   Pulmonary/Chest: Effort normal. He exhibits no tenderness.  Abdominal: Soft. Bowel sounds are normal.  PEG tube  Musculoskeletal: He exhibits edema and tenderness.  Neurological: He is alert and oriented to person, place, and time.  Skin: Skin is warm and dry. He is not diaphoretic.  Psychiatric: He has a normal mood and affect.    Labs reviewed: Basic Metabolic Panel:  Recent Labs  03/26/13 1829 05/17/13 0240 10/29/13 1118 03/24/14 0835  NA 137 135* 135 136  K 4.8 4.5 4.9 4.7  CL 94* 95* 98 94*  CO2 29  --  28 28  GLUCOSE 117* 111* 91 77  BUN 16 13 18 19   CREATININE 0.86 1.00 1.1 1.01  CALCIUM 9.9  --  9.5 9.5  TSH  --   --  2.70  --    Liver Function Tests:  Recent Labs  10/29/13 1118 03/24/14 0835  AST 42* 30  ALT 25 23  ALKPHOS 119* 118*  BILITOT 0.3  --   PROT 7.3 7.0  ALBUMIN 4.1  --    No results for input(s): LIPASE, AMYLASE in the last 8760 hours. No results for input(s): AMMONIA in the  last 8760 hours. CBC:  Recent Labs  03/26/13 1829 05/17/13 0117 05/17/13 0240 10/29/13 1118 03/24/14 0835  WBC 7.3 6.2  --  5.9 4.2  NEUTROABS 4.8  --   --   --  2.0  HGB 15.3 15.3 16.3 14.6 14.7  HCT 44.5 43.7 48.0 44.1 42.5  MCV 90.8 89.9  --  91.4 90  PLT 296 283  --  243.0 249   Lipid Panel:  Recent Labs  10/29/13 1118  CHOL 172  HDL 33.40*  TRIG 208.0*  CHOLHDL 5  LDLDIRECT 116.0   TSH:  Recent Labs  10/29/13 1118  TSH 2.70   A1C: Lab Results  Component Value Date   HGBA1C 6.8* 10/29/2013     Assessment/Plan 1. Laryngeal cancer Secretions improved but are chronic, following with ENT at [facility]. Next appt in April   2. Essential hypertension, benign conts on norvasc  3. Chronic sinusitis, unspecified location -completed doxycyline, has improved   4. Loose stools Pt on jevity for nutrition. Will start by adding OTC probiotics however occasional loose stools are expected   5. Edema Doppler was negative for DVT, question if norvasc could be causing swelling may try change medication at next visit.   6. Chronic pain advised to stop ibuprofen, may start tylenol 650 mg TID.   Will follow up in 3 months for EV pt wants prostate check and needs eye doctor

## 2014-03-26 NOTE — Progress Notes (Signed)
Passed the clock drawing

## 2014-03-28 ENCOUNTER — Emergency Department (HOSPITAL_COMMUNITY): Payer: Medicare Other

## 2014-03-28 ENCOUNTER — Encounter (HOSPITAL_COMMUNITY): Payer: Self-pay | Admitting: Emergency Medicine

## 2014-03-28 ENCOUNTER — Emergency Department (HOSPITAL_COMMUNITY)
Admission: EM | Admit: 2014-03-28 | Discharge: 2014-03-28 | Disposition: A | Discharge status: Home / Self Care | Payer: Medicare Other | Attending: Emergency Medicine | Admitting: Emergency Medicine

## 2014-03-28 DIAGNOSIS — Z85818 Personal history of malignant neoplasm of other sites of lip, oral cavity, and pharynx: Secondary | ICD-10-CM | POA: Insufficient documentation

## 2014-03-28 DIAGNOSIS — Z8739 Personal history of other diseases of the musculoskeletal system and connective tissue: Secondary | ICD-10-CM | POA: Insufficient documentation

## 2014-03-28 DIAGNOSIS — Z872 Personal history of diseases of the skin and subcutaneous tissue: Secondary | ICD-10-CM | POA: Insufficient documentation

## 2014-03-28 DIAGNOSIS — Z87891 Personal history of nicotine dependence: Secondary | ICD-10-CM | POA: Diagnosis not present

## 2014-03-28 DIAGNOSIS — E119 Type 2 diabetes mellitus without complications: Secondary | ICD-10-CM | POA: Insufficient documentation

## 2014-03-28 DIAGNOSIS — Z8709 Personal history of other diseases of the respiratory system: Secondary | ICD-10-CM | POA: Diagnosis not present

## 2014-03-28 DIAGNOSIS — Z79899 Other long term (current) drug therapy: Secondary | ICD-10-CM | POA: Diagnosis not present

## 2014-03-28 DIAGNOSIS — J9503 Malfunction of tracheostomy stoma: Secondary | ICD-10-CM | POA: Insufficient documentation

## 2014-03-28 DIAGNOSIS — I10 Essential (primary) hypertension: Secondary | ICD-10-CM | POA: Insufficient documentation

## 2014-03-28 DIAGNOSIS — H9190 Unspecified hearing loss, unspecified ear: Secondary | ICD-10-CM | POA: Diagnosis not present

## 2014-03-28 DIAGNOSIS — Z87440 Personal history of urinary (tract) infections: Secondary | ICD-10-CM | POA: Insufficient documentation

## 2014-03-28 DIAGNOSIS — R0602 Shortness of breath: Secondary | ICD-10-CM

## 2014-03-28 DIAGNOSIS — G8929 Other chronic pain: Secondary | ICD-10-CM | POA: Diagnosis not present

## 2014-03-28 DIAGNOSIS — J398 Other specified diseases of upper respiratory tract: Secondary | ICD-10-CM

## 2014-03-28 DIAGNOSIS — Z8659 Personal history of other mental and behavioral disorders: Secondary | ICD-10-CM | POA: Diagnosis not present

## 2014-03-28 DIAGNOSIS — K219 Gastro-esophageal reflux disease without esophagitis: Secondary | ICD-10-CM | POA: Diagnosis not present

## 2014-03-28 DIAGNOSIS — Z791 Long term (current) use of non-steroidal anti-inflammatories (NSAID): Secondary | ICD-10-CM | POA: Insufficient documentation

## 2014-03-28 LAB — I-STAT CHEM 8, ED
BUN: 22 mg/dL (ref 6–23)
CHLORIDE: 97 mmol/L (ref 96–112)
Calcium, Ion: 1.12 mmol/L — ABNORMAL LOW (ref 1.13–1.30)
Creatinine, Ser: 1.1 mg/dL (ref 0.50–1.35)
Glucose, Bld: 208 mg/dL — ABNORMAL HIGH (ref 70–99)
HCT: 46 % (ref 39.0–52.0)
Hemoglobin: 15.6 g/dL (ref 13.0–17.0)
Potassium: 4.3 mmol/L (ref 3.5–5.1)
SODIUM: 133 mmol/L — AB (ref 135–145)
TCO2: 23 mmol/L (ref 0–100)

## 2014-03-28 LAB — CBC WITH DIFFERENTIAL/PLATELET
BASOS ABS: 0 10*3/uL (ref 0.0–0.1)
Basophils Relative: 0 % (ref 0–1)
Eosinophils Absolute: 0.1 10*3/uL (ref 0.0–0.7)
Eosinophils Relative: 2 % (ref 0–5)
HCT: 41.9 % (ref 39.0–52.0)
Hemoglobin: 13.9 g/dL (ref 13.0–17.0)
LYMPHS PCT: 31 % (ref 12–46)
Lymphs Abs: 1.5 10*3/uL (ref 0.7–4.0)
MCH: 30.6 pg (ref 26.0–34.0)
MCHC: 33.2 g/dL (ref 30.0–36.0)
MCV: 92.3 fL (ref 78.0–100.0)
MONOS PCT: 17 % — AB (ref 3–12)
Monocytes Absolute: 0.8 10*3/uL (ref 0.1–1.0)
NEUTROS ABS: 2.5 10*3/uL (ref 1.7–7.7)
Neutrophils Relative %: 50 % (ref 43–77)
PLATELETS: 195 10*3/uL (ref 150–400)
RBC: 4.54 MIL/uL (ref 4.22–5.81)
RDW: 13.2 % (ref 11.5–15.5)
WBC: 5 10*3/uL (ref 4.0–10.5)

## 2014-03-28 LAB — BLOOD GAS, ARTERIAL
Acid-base deficit: 2.5 mmol/L — ABNORMAL HIGH (ref 0.0–2.0)
BICARBONATE: 23.5 meq/L (ref 20.0–24.0)
Drawn by: 232811
FIO2: 0.8 %
O2 Saturation: 97.5 %
PCO2 ART: 47.6 mmHg — AB (ref 35.0–45.0)
PO2 ART: 114 mmHg — AB (ref 80.0–100.0)
Patient temperature: 98.6
TCO2: 21.1 mmol/L (ref 0–100)
pH, Arterial: 7.315 — ABNORMAL LOW (ref 7.350–7.450)

## 2014-03-28 LAB — HGB A1C W/O EAG: Hgb A1c MFr Bld: 6.6 % — ABNORMAL HIGH (ref 4.8–5.6)

## 2014-03-28 LAB — SPECIMEN STATUS REPORT

## 2014-03-28 MED ORDER — HALOPERIDOL LACTATE 5 MG/ML IJ SOLN
INTRAMUSCULAR | Status: AC
  Administered 2014-03-28: 02:00:00
  Filled 2014-03-28: qty 1

## 2014-03-28 NOTE — ED Notes (Signed)
Resp at bedside to replace trach.

## 2014-03-28 NOTE — Discharge Instructions (Signed)
Shortness of Breath Shortness of breath means you have trouble breathing. Shortness of breath needs medical care right away. HOME CARE   Do not smoke.  Avoid being around chemicals or things (paint fumes, dust) that may bother your breathing.  Rest as needed. Slowly begin your normal activities.  Only take medicines as told by your doctor.  Keep all doctor visits as told. GET HELP RIGHT AWAY IF:   Your shortness of breath gets worse.  You feel lightheaded, pass out (faint), or have a cough that is not helped by medicine.  You cough up blood.  You have pain with breathing.  You have pain in your chest, arms, shoulders, or belly (abdomen).  You have a fever.  You cannot walk up stairs or exercise the way you normally do.  You do not get better in the time expected.  You have a hard time doing normal activities even with rest.  You have problems with your medicines.  You have any new symptoms. MAKE SURE YOU:  Understand these instructions.  Will watch your condition.  Will get help right away if you are not doing well or get worse. Document Released: 07/19/2007 Document Revised: 02/04/2013 Document Reviewed: 04/17/2011 [facility] Patient Information 2015 [place], [place]. This information is not intended to replace advice given to you by your health care provider. Make sure you discuss any questions you have with your health care provider. Your Lurline Idol was replaced with the same size  Please make an appointment with your PCP for follow up care

## 2014-03-28 NOTE — ED Notes (Signed)
Patient walked back to Res A with trach already out When RT arrived at bedside and [facility] lowered to replace trach, patient became uncooperative and combative with staff Patient out of bed multiple times, pushing staff and spraying blood from trach site Haldol given due to behavior, see MAR RT made multiple attempts to replace trach tube without success ET tube placed by RT and patient bagged until placed on ventilator

## 2014-03-28 NOTE — ED Notes (Signed)
Patient informed of DC instructions and need to make and keep f/u appointment Patient did not acknowledge or look at this nurse when DC instructions Patient asked if he had any questions and patient waived hand in the air and waived patient away Patient in NAD at time of DC Trach site WNL at time of DC

## 2014-03-28 NOTE — ED Notes (Addendum)
Patient resting in position of comfort with eyes closed after admin of Haldol, see MAR RR WNL--even and unlabored with equal rise and fall of chest Patient in NAD Side rails up, call bell in reach

## 2014-03-28 NOTE — ED Notes (Signed)
CXR at bedside

## 2014-03-28 NOTE — Progress Notes (Addendum)
Arrived to pt bedside around 0100 to attempt to replace trach.  Pt sitting at side of bed on room air, spo2 93%.  RT noted that stoma was very narrowed and nearly closed.  Pt wrote that his trach came out about an hour prior to arrival in ED.  Pt requested to be suctioned out multiple times before first attempt made to reinsert a new trach of same size/type of his old one (6xl shiley cuffless).  Pt had very little flexion of his neck due to hx of radiation in that area.  Patient was suctioned for moderate amount of frank blood secretions.  First attempt made to reinsert new trach but was unsuccessful.  Pt then got out of bed, walking around room stating he can't breathe.  Pt was very anxious, uncooperative, combative with myself and another RT and refusing to get back in bed for RT to assist pt.  Additional staff asked to come to bedside to assist.  Second attempt made to reinsert trach again unsuccessfully.  O2 sats dropped in 40s with questionable wave form for spo2.  6.0 ETT inserted to obtain an airway with little difficulty, but with copious amount of frank red blood.  +Etco2 color change was noted.  Pt was bagged on 100% O2.  Dr Venora Maples arrived to pt bedside and successfully inserted the 6xl shiley trach.  Positive etco2 color change noted.  Pt bagged on 15L o2 until O2 sats came up to 100%.  Trach secured with trach ties and pt placed on 80% atc.  Rx received for abg.  Po2 showed 114, therefore pt weaned down to 60%.  Rn notified.

## 2014-03-28 NOTE — ED Notes (Signed)
Patient repeatedly turning off monitor  Patient informed that this is inappropriate and that if he came to the ED for trach issues that lead to respiratory distress, that the monitor needs to stay on

## 2014-03-28 NOTE — ED Notes (Signed)
Patient states that he "sometimes" wears O2 and collar while at home Patient reports that he has all of his respiratory supplies at home

## 2014-03-28 NOTE — ED Notes (Signed)
Pt reports he trach came out about 45 minutes ago. Pt communicates via writing. Pt is alert and oriented.

## 2014-03-28 NOTE — ED Notes (Signed)
MD at bedside. 

## 2014-03-28 NOTE — ED Notes (Signed)
Collar removed at 0402 O2 sat have decreased to 92% on room air Collar put back in place Will make EDP aware

## 2014-03-28 NOTE — ED Notes (Signed)
Per EDP, collar to be removed to assess O2 sat on room air

## 2014-03-28 NOTE — ED Provider Notes (Signed)
CSN: [id number]     Arrival date & time 03/28/14  0045 History   First MD Initiated Contact with Patient 03/28/14 0058     Chief Complaint  Patient presents with  . Trach out      (Consider location/radiation/quality/duration/timing/severity/associated sxs/prior Treatment) HPI Comments: Patient with a history of laryngeal cancer and trach presents with complaint of his trach came out 45 minutes ago  The history is provided by the patient.    Past Medical History  Diagnosis Date  . Hearing loss   . Change in voice   . Rash   . Trouble swallowing   . Difficulty urinating   . Cervical spondylosis 03/25/2011  . Emphysema 03/25/2011  . GERD (gastroesophageal reflux disease) 03/25/2011  . Chronic pain 03/25/2011  . 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 Xerostomia 03/25/2011  . Ulcer   . UTI (lower urinary tract infection)   . Allergy   . Chronic sinusitis 04/27/2011  . Impaired glucose tolerance 04/27/2011  . Cervical spondylolysis 04/30/2011    severe  . Cancer 1999    throat cancer  . Essential hypertension, benign 12/03/2012  . Type II or unspecified type diabetes mellitus without mention of complication, uncontrolled 04/27/2011  . Depression    Past Surgical History  Procedure Laterality Date  . Peg tube placement  11/1999  . Tracheostomy  11/1999  . Gastrostomy tube placement     Family History  Problem Relation Age of Onset  . Stroke Other   . Diabetes Other   . Cancer Other     lung cancer  . Diabetes Mother   . Heart disease Father    History  Substance Use Topics  . Smoking status: Former Smoker -- 23 years    Quit date: 11/06/1996  . Smokeless tobacco: Never Used  . Alcohol Use: No    Review of Systems  Constitutional: Negative for fever.  HENT: Negative for drooling.   Respiratory: Negative for shortness of breath.   All other systems reviewed and are negative.     Allergies  Codeine  Home Medications   Prior to Admission medications   Medication  Sig Start Date End Date Taking? Authorizing Provider  amLODipine (NORVASC) 2.5 MG tablet take 1 tablet by mouth once daily 01/29/14  Yes Biagio Borg, MD  ibuprofen (ADVIL,MOTRIN) 400 MG tablet Take 400 mg by mouth 3 (three) times daily.   Yes Historical Provider, MD  naphazoline-pheniramine (NAPHCON-A) 0.025-0.3 % ophthalmic solution Place 2 drops into both eyes 4 (four) times daily as needed for irritation (red eye).   Yes Historical Provider, MD  Nutritional Supplements (FEEDING SUPPLEMENT, JEVITY 1.2 CAL,) LIQD Place 237 mLs into feeding tube daily. Pt takes 2 cans at breakfast, 2 cans at lunch, 2 cans at dinner and 2 at night   Yes Historical Provider, MD  oxymetazoline (12 HOUR NASAL SPRAY) 0.05 % nasal spray Place 2 sprays into the nose 2 (two) times daily as needed for congestion.    Yes Historical Provider, MD  ranitidine (ZANTAC) 150 MG tablet Take 150 mg by mouth 2 (two) times daily.   Yes Historical Provider, MD  triamcinolone cream (KENALOG) 0.1 % Apply 1 application topically 2 (two) times daily. 03/05/14  Yes [name], NP   BP 130/73 mmHg  Pulse 80  Temp(Src) 98 F (36.7 C) (Oral)  Resp 20  SpO2 93% Physical Exam  Constitutional: He appears well-developed and well-nourished.  HENT:  Head: Normocephalic.  Eyes: Pupils are equal, round, and  reactive to light.  Neck:  Due to radiation.  Patient hasn't inflexible.  Neck  Cardiovascular: Normal rate and regular rhythm.   Pulmonary/Chest: Effort normal. No respiratory distress.  Abdominal: Soft.  Musculoskeletal: Normal range of motion.  Neurological: He is alert.  Skin: Skin is warm.  Vitals reviewed.   ED Course  Procedures (including critical care time) Labs Review Labs Reviewed  CBC WITH DIFFERENTIAL/PLATELET - Abnormal; Notable for the following:    Monocytes Relative 17 (*)    All other components within normal limits  BLOOD GAS, ARTERIAL - Abnormal; Notable for the following:    pH, Arterial 7.315 (*)     pCO2 arterial 47.6 (*)    pO2, Arterial 114.0 (*)    Acid-base deficit 2.5 (*)    All other components within normal limits  I-STAT CHEM 8, ED - Abnormal; Notable for the following:    Sodium 133 (*)    Glucose, Bld 208 (*)    Calcium, Ion 1.12 (*)    All other components within normal limits    Imaging Review Dg Chest Port 1 View  03/28/2014   CLINICAL DATA:  Acute onset of shortness of breath. Re-insertion of tracheostomy tube. Initial encounter.  EXAM: PORTABLE CHEST - 1 VIEW  COMPARISON:  Chest radiograph performed 09/24/2013  FINDINGS: The patient's tracheostomy tube is seen ending 1.5 cm above the carina.  The lungs are mildly hypoexpanded. Vascular crowding and vascular congestion are seen. Mildly increased interstitial markings could reflect minimal interstitial edema. No pleural effusion pneumothorax is seen.  The cardiomediastinal silhouette is normal in size. No acute osseous abnormalities are seen.  IMPRESSION: 1. Tracheostomy tube seen ending 1.5 cm above the carina. 2. Lungs mildly hypoexpanded. Vascular congestion noted. Mildly increased interstitial markings could reflect minimal interstitial edema.   Electronically Signed   By: Garald Balding M.D.   On: 03/28/2014 01:59     EKG Interpretation None     Bicitra to bedside to attempt trach placement, drainage, difficulty a 6 ET tube was placed and patient was bagged.  His O2 sats fell to 30%.  Dr. Venora Maples to the bedside again attempted husband of a 6 ET tube, which was successful.  Patient sets immediately return to greater than 90% Patient was monitored.  He was put on supplemental oxygen via his trach for short period of time maintaining his sats between 95 and 100%.  He was transitioned to room air or his maintaining his oxygen sat between 91 and 93% at rest. Respiratory performed deep suctioning.  Patient states he feels better after this is alert and oriented and back to his baseline.  I feel at this time he is safe to be  discharged home.  He does have oxygen at home that he can use as needed. Patient ambulatory in department  MDM   Final diagnoses:  SOB (shortness of breath)  Trachea displaced         Garald Balding, NP 03/28/14 [place], MD 03/28/14 [phone]

## 2014-03-28 NOTE — ED Notes (Signed)
Patient states that he feels better and wants to be suctioned RT aware Patient alert and oriented x 4, calm and cooperative at this time

## 2014-04-22 ENCOUNTER — Other Ambulatory Visit: Payer: Self-pay | Admitting: *Deleted

## 2014-04-22 MED ORDER — SULFAMETHOXAZOLE-TRIMETHOPRIM 800-160 MG PO TABS: 1.0000 | ORAL_TABLET | Freq: Two times a day (BID) | ORAL | Status: DC

## 2014-04-27 ENCOUNTER — Other Ambulatory Visit: Payer: Self-pay | Admitting: *Deleted

## 2014-04-27 MED ORDER — IBUPROFEN 400 MG PO TABS: 400.0000 mg | ORAL_TABLET | Freq: Three times a day (TID) | ORAL | Status: DC

## 2014-04-27 NOTE — Telephone Encounter (Signed)
[facility] 

## 2014-04-29 ENCOUNTER — Ambulatory Visit: Payer: Medicare Other | Admitting: Internal Medicine

## 2014-04-30 ENCOUNTER — Ambulatory Visit (INDEPENDENT_AMBULATORY_CARE_PROVIDER_SITE_OTHER): Payer: Medicare Other | Admitting: Nurse Practitioner

## 2014-04-30 ENCOUNTER — Encounter: Payer: Self-pay | Admitting: Nurse Practitioner

## 2014-04-30 VITALS — BP 138/90 | HR 71 | Temp 99.3°F | Wt 166.4 lb

## 2014-04-30 DIAGNOSIS — R197 Diarrhea, unspecified: Secondary | ICD-10-CM | POA: Diagnosis not present

## 2014-04-30 DIAGNOSIS — R609 Edema, unspecified: Secondary | ICD-10-CM | POA: Diagnosis not present

## 2014-04-30 NOTE — Patient Instructions (Signed)
Will refer you to gastroenterologist for further evaluation of diarrhea

## 2014-04-30 NOTE — Progress Notes (Signed)
Patient ID: Nicholas Cobb, male   DOB: 12-Jan-1951, 64 y.o.   MRN: [id number]    PCP: [name], NP  Allergies  Allergen Reactions  . Codeine Nausea And Vomiting and Rash    Chief Complaint  Patient presents with  . Personal Problem    Patient with personal concerns to discuss only with provider     HPI: Patient is a 64 y.o. male seen in the office today due to loose stool. Has been on and off for 2 years. Some days he will have 1 stool other 4-5. Currently on Jevity by surgeon. Has tried OTC imodium without effects. Was having this problem before he was placed on Jevity. No abdomen pain, no fevers or chills. Tired probiotics which didn't not help. Last normal stool was a few days ago.    Review of Systems:  Review of Systems  Constitutional: Negative for fever, chills and activity change.  HENT: Positive for drooling, facial swelling (chronic right sided facial swelling after radiation) and trouble swallowing (has PEG tube for feeding). Negative for congestion and sinus pressure.   Respiratory: Negative for cough, shortness of breath (at times) and wheezing.   Cardiovascular: Positive for leg swelling (right leg edema). Negative for chest pain.  Gastrointestinal: Negative for abdominal pain and abdominal distention.  Genitourinary: Positive for frequency. Negative for flank pain and difficulty urinating.  Musculoskeletal: Negative for myalgias and arthralgias.  Neurological: Positive for facial asymmetry (chronic after radiation) and speech difficulty (has trach). Negative for dizziness.    Past Medical History  Diagnosis Date  . Hearing loss   . Change in voice   . Rash   . Trouble swallowing   . Difficulty urinating   . Cervical spondylosis 03/25/2011  . Emphysema 03/25/2011  . GERD (gastroesophageal reflux disease) 03/25/2011  . Chronic pain 03/25/2011  . 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 Xerostomia 03/25/2011  . Ulcer   . UTI (lower urinary tract infection)   .  Allergy   . Chronic sinusitis 04/27/2011  . Impaired glucose tolerance 04/27/2011  . Cervical spondylolysis 04/30/2011    severe  . Cancer 1999    throat cancer  . Essential hypertension, benign 12/03/2012  . Type II or unspecified type diabetes mellitus without mention of complication, uncontrolled 04/27/2011  . Depression    Past Surgical History  Procedure Laterality Date  . Peg tube placement  11/1999  . Tracheostomy  11/1999  . Gastrostomy tube placement     Social History:   reports that he quit smoking about 17 years ago. He has never used smokeless tobacco. He reports that he does not drink alcohol or use illicit drugs.  Family History  Problem Relation Age of Onset  . Stroke Other   . Diabetes Other   . Cancer Other     lung cancer  . Diabetes Mother   . Heart disease Father     Medications: Patient's Medications  New Prescriptions   No medications on file  Previous Medications   AMLODIPINE (NORVASC) 2.5 MG TABLET    take 1 tablet by mouth once daily   IBUPROFEN (ADVIL,MOTRIN) 400 MG TABLET    Take 1 tablet (400 mg total) by mouth 3 (three) times daily.   NAPHAZOLINE-PHENIRAMINE (NAPHCON-A) 0.025-0.3 % OPHTHALMIC SOLUTION    Place 2 drops into both eyes 4 (four) times daily as needed for irritation (red eye).   NUTRITIONAL SUPPLEMENTS (FEEDING SUPPLEMENT, JEVITY 1.2 CAL,) LIQD    Place 237 mLs into feeding tube  daily. Pt takes 2 cans at breakfast, 2 cans at lunch, 2 cans at dinner and 2 at night   OXYMETAZOLINE (12 HOUR NASAL SPRAY) 0.05 % NASAL SPRAY    Place 2 sprays into the nose 2 (two) times daily as needed for congestion.    RANITIDINE (ZANTAC) 150 MG TABLET    Take 150 mg by mouth 2 (two) times daily.   SULFAMETHOXAZOLE-TRIMETHOPRIM (SMZ-TMP DS) 800-160 MG PER TABLET    Take 1 tablet by mouth every 12 (twelve) hours.   TRIAMCINOLONE CREAM (KENALOG) 0.1 %    Apply 1 application topically 2 (two) times daily.  Modified Medications   No medications on file    Discontinued Medications   No medications on file     Physical Exam:  Filed Vitals:   04/30/14 1007  BP: 138/90  Pulse: 71  Temp: 99.3 F (37.4 C)  TempSrc: Oral  Weight: 166 lb 6.4 oz (75.479 kg)  SpO2: 99%    Physical Exam  Constitutional: He is oriented to person, place, and time. He appears well-developed and well-nourished. No distress.  HENT:  Head: Normocephalic and atraumatic.  Mouth/Throat: Oropharynx is clear and moist. No oropharyngeal exudate.  Chronic right side facial swelling after radiation.   Eyes: Conjunctivae and EOM are normal. Pupils are equal, round, and reactive to light.  Neck: Normal range of motion.  Trach intact  Cardiovascular: Normal rate, regular rhythm and normal heart sounds.   Pulmonary/Chest: Effort normal. He exhibits no tenderness.  Abdominal: Soft. Bowel sounds are normal. He exhibits no distension. There is no tenderness. There is no rebound and no guarding.  PEG tube  Musculoskeletal: He exhibits edema and tenderness.  Neurological: He is alert and oriented to person, place, and time.  Skin: Skin is warm and dry. He is not diaphoretic.  Psychiatric: He has a normal mood and affect.    Labs reviewed: Basic Metabolic Panel:  Recent Labs  10/29/13 1118 03/24/14 0835 03/28/14 0224  NA 135 136 133*  K 4.9 4.7 4.3  CL 98 94* 97  CO2 28 28  --   GLUCOSE 91 77 208*  BUN 18 19 22   CREATININE 1.1 1.01 1.10  CALCIUM 9.5 9.5  --   TSH 2.70  --   --    Liver Function Tests:  Recent Labs  10/29/13 1118 03/24/14 0835  AST 42* 30  ALT 25 23  ALKPHOS 119* 118*  BILITOT 0.3 0.4  PROT 7.3 7.0  ALBUMIN 4.1  --    No results for input(s): LIPASE, AMYLASE in the last 8760 hours. No results for input(s): AMMONIA in the last 8760 hours. CBC:  Recent Labs  10/29/13 1118 03/24/14 0835 03/28/14 0212 03/28/14 0224  WBC 5.9 4.2 5.0  --   NEUTROABS  --  2.0 2.5  --   HGB 14.6 14.7 13.9 15.6  HCT 44.1 42.5 41.9 46.0  MCV  91.4 90 92.3  --   PLT 243.0 249 195  --    Lipid Panel:  Recent Labs  10/29/13 1118  CHOL 172  HDL 33.40*  TRIG 208.0*  CHOLHDL 5  LDLDIRECT 116.0   TSH:  Recent Labs  10/29/13 1118  TSH 2.70   A1C: Lab Results  Component Value Date   HGBA1C 6.6* 03/24/2014     Assessment/Plan  1. Diarrhea -could be due to IBS vs inflammatory bowel disease-- discussed case with Dr Nyoka Cowden, due to pts complex medical hx and diarrhea that has persisted on and off  for 2 years without relief, will refer to GI for through evaluation and treatment.  - Ambulatory referral to Gastroenterology  2. Edema -unchanged to LE, previous doppler was negative for DVT  30 mins Time TOTAL:  time greater than 50% of total time spent doing pt counseled and plan of care regarding diarrhea, also discussed lab work again and LE edema

## 2014-05-09 ENCOUNTER — Emergency Department (HOSPITAL_COMMUNITY)
Admission: EM | Admit: 2014-05-09 | Discharge: 2014-05-10 | Disposition: A | Discharge status: Home / Self Care | Payer: Medicare Other | Attending: Emergency Medicine | Admitting: Emergency Medicine

## 2014-05-09 ENCOUNTER — Encounter (HOSPITAL_COMMUNITY): Payer: Self-pay

## 2014-05-09 DIAGNOSIS — Z8739 Personal history of other diseases of the musculoskeletal system and connective tissue: Secondary | ICD-10-CM | POA: Insufficient documentation

## 2014-05-09 DIAGNOSIS — I10 Essential (primary) hypertension: Secondary | ICD-10-CM | POA: Insufficient documentation

## 2014-05-09 DIAGNOSIS — Z792 Long term (current) use of antibiotics: Secondary | ICD-10-CM | POA: Diagnosis not present

## 2014-05-09 DIAGNOSIS — K219 Gastro-esophageal reflux disease without esophagitis: Secondary | ICD-10-CM | POA: Insufficient documentation

## 2014-05-09 DIAGNOSIS — Z8709 Personal history of other diseases of the respiratory system: Secondary | ICD-10-CM | POA: Insufficient documentation

## 2014-05-09 DIAGNOSIS — J439 Emphysema, unspecified: Secondary | ICD-10-CM | POA: Insufficient documentation

## 2014-05-09 DIAGNOSIS — K9423 Gastrostomy malfunction: Secondary | ICD-10-CM | POA: Insufficient documentation

## 2014-05-09 DIAGNOSIS — E119 Type 2 diabetes mellitus without complications: Secondary | ICD-10-CM | POA: Insufficient documentation

## 2014-05-09 DIAGNOSIS — H9190 Unspecified hearing loss, unspecified ear: Secondary | ICD-10-CM | POA: Diagnosis not present

## 2014-05-09 DIAGNOSIS — Z7952 Long term (current) use of systemic steroids: Secondary | ICD-10-CM | POA: Insufficient documentation

## 2014-05-09 DIAGNOSIS — Z87440 Personal history of urinary (tract) infections: Secondary | ICD-10-CM | POA: Insufficient documentation

## 2014-05-09 DIAGNOSIS — Z872 Personal history of diseases of the skin and subcutaneous tissue: Secondary | ICD-10-CM | POA: Insufficient documentation

## 2014-05-09 DIAGNOSIS — Z79899 Other long term (current) drug therapy: Secondary | ICD-10-CM | POA: Insufficient documentation

## 2014-05-09 DIAGNOSIS — Z87891 Personal history of nicotine dependence: Secondary | ICD-10-CM | POA: Insufficient documentation

## 2014-05-09 DIAGNOSIS — G8929 Other chronic pain: Secondary | ICD-10-CM | POA: Insufficient documentation

## 2014-05-09 DIAGNOSIS — Z8659 Personal history of other mental and behavioral disorders: Secondary | ICD-10-CM | POA: Diagnosis not present

## 2014-05-09 DIAGNOSIS — Z85818 Personal history of malignant neoplasm of other sites of lip, oral cavity, and pharynx: Secondary | ICD-10-CM | POA: Insufficient documentation

## 2014-05-09 DIAGNOSIS — T85598A Other mechanical complication of other gastrointestinal prosthetic devices, implants and grafts, initial encounter: Secondary | ICD-10-CM

## 2014-05-09 MED ORDER — HYDROMORPHONE HCL 1 MG/ML IJ SOLN
1.0000 mg | Freq: Once | INTRAMUSCULAR | Status: AC
  Administered 2014-05-09: 1 mg via INTRAMUSCULAR
  Filled 2014-05-09: qty 1

## 2014-05-09 MED ORDER — LORAZEPAM 1 MG PO TABS
1.0000 mg | ORAL_TABLET | Freq: Once | ORAL | Status: DC
  Filled 2014-05-09: qty 1

## 2014-05-09 MED ORDER — OXYCODONE-ACETAMINOPHEN 5-325 MG PO TABS
2.0000 | ORAL_TABLET | Freq: Once | ORAL | Status: DC
  Filled 2014-05-09: qty 2

## 2014-05-09 NOTE — ED Notes (Signed)
Pt presents with c/o feeding tube insertion. Pt reports his fell out 2 hours ago, brought it with him.

## 2014-05-09 NOTE — ED Provider Notes (Signed)
CSN: [id number]     Arrival date & time 05/09/14  2112 History   First MD Initiated Contact with Patient 05/09/14 2120     Chief Complaint  Patient presents with  . Feeding Tube Insertion      (Consider location/radiation/quality/duration/timing/severity/associated sxs/prior Treatment) HPI Comments: Patient with a history of Osteoradionecrosis of the jaw and Throat Cancer presents today due to the fact that his PEG tube fell out approximately 2 hours ago.  It is unclear exactly how the tube fell out.  However, patient states that he was unable to get the tube back in and came to the ED to have it put back in.  He reports that the tube has been working correctly.  He brought the tube to the ED with him.  He states that he has had the tube in for many years.  He denies fever, chills, nausea, or vomiting.    The history is provided by the patient.    Past Medical History  Diagnosis Date  . Hearing loss   . Change in voice   . Rash   . Trouble swallowing   . Difficulty urinating   . Cervical spondylosis 03/25/2011  . Emphysema 03/25/2011  . GERD (gastroesophageal reflux disease) 03/25/2011  . Chronic pain 03/25/2011  . 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 Xerostomia 03/25/2011  . Ulcer   . UTI (lower urinary tract infection)   . Allergy   . Chronic sinusitis 04/27/2011  . Impaired glucose tolerance 04/27/2011  . Cervical spondylolysis 04/30/2011    severe  . Cancer 1999    throat cancer  . Essential hypertension, benign 12/03/2012  . Type II or unspecified type diabetes mellitus without mention of complication, uncontrolled 04/27/2011  . Depression    Past Surgical History  Procedure Laterality Date  . Peg tube placement  11/1999  . Tracheostomy  11/1999  . Gastrostomy tube placement     Family History  Problem Relation Age of Onset  . Stroke Other   . Diabetes Other   . Cancer Other     lung cancer  . Diabetes Mother   . Heart disease Father    History  Substance Use Topics    . Smoking status: Former Smoker -- 23 years    Quit date: 11/06/1996  . Smokeless tobacco: Never Used  . Alcohol Use: No    Review of Systems  All other systems reviewed and are negative.     Allergies  Codeine  Home Medications   Prior to Admission medications   Medication Sig Start Date End Date Taking? Authorizing Provider  amLODipine (NORVASC) 2.5 MG tablet take 1 tablet by mouth once daily 01/29/14  Yes Biagio Borg, MD  ibuprofen (ADVIL,MOTRIN) 400 MG tablet Take 1 tablet (400 mg total) by mouth 3 (three) times daily. 04/27/14  Yes Tiffany L Reed, DO  naphazoline-pheniramine (NAPHCON-A) 0.025-0.3 % ophthalmic solution Place 2 drops into both eyes 4 (four) times daily as needed for irritation (red eye).   Yes Historical Provider, MD  Nutritional Supplements (FEEDING SUPPLEMENT, JEVITY 1.2 CAL,) LIQD Place 237 mLs into feeding tube daily. Pt takes 2 cans at breakfast, 2 cans at lunch, 2 cans at dinner and 2 at night   Yes Historical Provider, MD  oxymetazoline (12 HOUR NASAL SPRAY) 0.05 % nasal spray Place 2 sprays into the nose 2 (two) times daily as needed for congestion.    Yes Historical Provider, MD  ranitidine (ZANTAC) 150 MG tablet Take 150 mg by mouth  2 (two) times daily.   Yes Historical Provider, MD  triamcinolone cream (KENALOG) 0.1 % Apply 1 application topically 2 (two) times daily. 03/05/14  Yes [name], NP  sulfamethoxazole-trimethoprim (SMZ-TMP DS) 800-160 MG per tablet Take 1 tablet by mouth every 12 (twelve) hours. 04/22/14   Estill Dooms, MD   BP 196/88 mmHg  Pulse 77  Temp(Src) 98.8 F (37.1 C) (Oral)  Resp 15  SpO2 99% Physical Exam  Constitutional: He appears well-developed and well-nourished.  HENT:  Head: Normocephalic and atraumatic.  Neck: Normal range of motion. Neck supple.  Cardiovascular: Normal rate, regular rhythm and normal heart sounds.   Pulmonary/Chest: Effort normal and breath sounds normal.  Abdominal:    Neurological: He  is alert.  Skin: Skin is warm and dry.  Nursing note and vitals reviewed.   ED Course  Procedures (including critical care time) Labs Review Labs Reviewed - No data to display  Imaging Review No results found.   EKG Interpretation None     9:45 PM Attempted to put the PEG tube back in place.  Patient then became very agitated and stated that he was having pain and did not want me to put it back in.   10:30 PM Again attempted to insert the PEG tube.  Was unable to get it to go back in.    10:40 PM Dr. Mingo Amber attempted to put the PEG tube in place.  Was able to get a Catheter in place.  Will leave Catheter in for 10 minutes and reassess.    Dr. Mingo Amber removed the Catheter and put a larger Catheter in place  Dr. Mingo Amber removed that Catheter and again put a larger Catheter in place.   12:00 AM Dr Mingo Amber was able to get the PEG tube back in place  MDM   Final diagnoses:  None  Patient presents today because his PEG tube fell out.  No signs of infection of the site.  Dr. Mingo Amber was able to get the PEG tube back in place.  Patient stable for discharge.  Return precautions given.    Hyman Bible, PA-C 05/11/14 2211  Evelina Bucy, MD 05/12/14 [phone]

## 2014-05-15 ENCOUNTER — Encounter (HOSPITAL_COMMUNITY): Payer: Self-pay | Admitting: Emergency Medicine

## 2014-05-15 ENCOUNTER — Emergency Department (HOSPITAL_COMMUNITY)
Admission: EM | Admit: 2014-05-15 | Discharge: 2014-05-15 | Disposition: A | Discharge status: Home / Self Care | Payer: Medicare Other | Attending: Emergency Medicine | Admitting: Emergency Medicine

## 2014-05-15 DIAGNOSIS — Z792 Long term (current) use of antibiotics: Secondary | ICD-10-CM | POA: Diagnosis not present

## 2014-05-15 DIAGNOSIS — Z8589 Personal history of malignant neoplasm of other organs and systems: Secondary | ICD-10-CM | POA: Insufficient documentation

## 2014-05-15 DIAGNOSIS — Z8739 Personal history of other diseases of the musculoskeletal system and connective tissue: Secondary | ICD-10-CM | POA: Diagnosis not present

## 2014-05-15 DIAGNOSIS — R0602 Shortness of breath: Secondary | ICD-10-CM

## 2014-05-15 DIAGNOSIS — J9500 Unspecified tracheostomy complication: Secondary | ICD-10-CM | POA: Diagnosis not present

## 2014-05-15 DIAGNOSIS — H9190 Unspecified hearing loss, unspecified ear: Secondary | ICD-10-CM | POA: Insufficient documentation

## 2014-05-15 DIAGNOSIS — Z791 Long term (current) use of non-steroidal anti-inflammatories (NSAID): Secondary | ICD-10-CM | POA: Insufficient documentation

## 2014-05-15 DIAGNOSIS — E119 Type 2 diabetes mellitus without complications: Secondary | ICD-10-CM | POA: Diagnosis not present

## 2014-05-15 DIAGNOSIS — K219 Gastro-esophageal reflux disease without esophagitis: Secondary | ICD-10-CM | POA: Insufficient documentation

## 2014-05-15 DIAGNOSIS — Z79899 Other long term (current) drug therapy: Secondary | ICD-10-CM | POA: Insufficient documentation

## 2014-05-15 DIAGNOSIS — I10 Essential (primary) hypertension: Secondary | ICD-10-CM | POA: Diagnosis not present

## 2014-05-15 DIAGNOSIS — G8929 Other chronic pain: Secondary | ICD-10-CM | POA: Insufficient documentation

## 2014-05-15 DIAGNOSIS — Z87440 Personal history of urinary (tract) infections: Secondary | ICD-10-CM | POA: Diagnosis not present

## 2014-05-15 DIAGNOSIS — R061 Stridor: Secondary | ICD-10-CM | POA: Insufficient documentation

## 2014-05-15 DIAGNOSIS — R062 Wheezing: Secondary | ICD-10-CM | POA: Insufficient documentation

## 2014-05-15 DIAGNOSIS — R0682 Tachypnea, not elsewhere classified: Secondary | ICD-10-CM | POA: Diagnosis not present

## 2014-05-15 NOTE — ED Notes (Signed)
Pt to ED via private vehicle with c/o- "something is wrong with my trach" wheezing audible through trach.

## 2014-05-15 NOTE — ED Notes (Signed)
resp therapy at bedside suctioning pt.

## 2014-05-15 NOTE — ED Provider Notes (Addendum)
CSN: [id number]     Arrival date & time 05/15/14  0745 History   First MD Initiated Contact with Patient 05/15/14 [phone]     Chief Complaint  Patient presents with  . Shortness of Breath  . trach problem      (Consider location/radiation/quality/duration/timing/severity/associated sxs/prior Treatment) HPI Comments: Pt states he was not home to suction himself and no feels like his trach is clogged.  Patient is a 64 y.o. male presenting with shortness of breath. The history is provided by the patient.  Shortness of Breath Severity:  Moderate Onset quality:  Sudden Timing:  Constant Progression:  Unchanged Chronicity:  Recurrent Context comment:  Patient has a trach and states he feels like it is clogged and he is feeling short of breath. Started this morning Relieved by:  Nothing Worsened by:  Nothing tried Associated symptoms: no chest pain, no cough, no fever, no neck pain and no sputum production   Associated symptoms comment:  No drainage from his trach Risk factors comment:  History of throat cancer status post tracheostomy   Past Medical History  Diagnosis Date  . Hearing loss   . Change in voice   . Rash   . Trouble swallowing   . Difficulty urinating   . Cervical spondylosis 03/25/2011  . Emphysema 03/25/2011  . GERD (gastroesophageal reflux disease) 03/25/2011  . Chronic pain 03/25/2011  . 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 Xerostomia 03/25/2011  . Ulcer   . UTI (lower urinary tract infection)   . Allergy   . Chronic sinusitis 04/27/2011  . Impaired glucose tolerance 04/27/2011  . Cervical spondylolysis 04/30/2011    severe  . Cancer 1999    throat cancer  . Essential hypertension, benign 12/03/2012  . Type II or unspecified type diabetes mellitus without mention of complication, uncontrolled 04/27/2011  . Depression    Past Surgical History  Procedure Laterality Date  . Peg tube placement  11/1999  . Tracheostomy  11/1999  . Gastrostomy tube placement     Family  History  Problem Relation Age of Onset  . Stroke Other   . Diabetes Other   . Cancer Other     lung cancer  . Diabetes Mother   . Heart disease Father    History  Substance Use Topics  . Smoking status: Former Smoker -- 23 years    Quit date: 11/06/1996  . Smokeless tobacco: Never Used  . Alcohol Use: No    Review of Systems  Constitutional: Negative for fever.  Respiratory: Positive for shortness of breath. Negative for cough and sputum production.   Cardiovascular: Negative for chest pain.  Musculoskeletal: Negative for neck pain.  All other systems reviewed and are negative.     Allergies  Codeine  Home Medications   Prior to Admission medications   Medication Sig Start Date End Date Taking? Authorizing Provider  amLODipine (NORVASC) 2.5 MG tablet take 1 tablet by mouth once daily 01/29/14   Biagio Borg, MD  ibuprofen (ADVIL,MOTRIN) 400 MG tablet Take 1 tablet (400 mg total) by mouth 3 (three) times daily. 04/27/14   Tiffany L Reed, DO  naphazoline-pheniramine (NAPHCON-A) 0.025-0.3 % ophthalmic solution Place 2 drops into both eyes 4 (four) times daily as needed for irritation (red eye).    Historical Provider, MD  Nutritional Supplements (FEEDING SUPPLEMENT, JEVITY 1.2 CAL,) LIQD Place 237 mLs into feeding tube daily. Pt takes 2 cans at breakfast, 2 cans at lunch, 2 cans at dinner and 2 at night  Historical Provider, MD  oxymetazoline (12 HOUR NASAL SPRAY) 0.05 % nasal spray Place 2 sprays into the nose 2 (two) times daily as needed for congestion.     Historical Provider, MD  ranitidine (ZANTAC) 150 MG tablet Take 150 mg by mouth 2 (two) times daily.    Historical Provider, MD  sulfamethoxazole-trimethoprim (SMZ-TMP DS) 800-160 MG per tablet Take 1 tablet by mouth every 12 (twelve) hours. 04/22/14   Estill Dooms, MD  triamcinolone cream (KENALOG) 0.1 % Apply 1 application topically 2 (two) times daily. 03/05/14   [name], NP   BP 167/99 mmHg  Pulse 97   Temp(Src) 100.1 F (37.8 C) (Oral)  Resp 22  Wt 166 lb (75.297 kg)  SpO2 98% Physical Exam  Constitutional: He is oriented to person, place, and time. He appears well-developed and well-nourished. No distress.  HENT:  Head: Normocephalic and atraumatic.  Mouth/Throat: Oropharynx is clear and moist.  Facial deformity of the right jaw  Eyes: Conjunctivae and EOM are normal. Pupils are equal, round, and reactive to light.  Neck: Normal range of motion. Neck supple. No tracheal deviation present.  Slight stridor.  Trach present.  In place and no drainage  Cardiovascular: Normal rate, regular rhythm and intact distal pulses.   No murmur heard. Pulmonary/Chest: Effort normal. Stridor present. Tachypnea noted. No respiratory distress. He has wheezes. He has no rhonchi. He has no rales.  Abdominal: Soft. He exhibits no distension. There is no tenderness. There is no rebound and no guarding.  Musculoskeletal: Normal range of motion. He exhibits no edema or tenderness.  Neurological: He is alert and oriented to person, place, and time.  Skin: Skin is warm and dry. No rash noted. No erythema.  Psychiatric: He has a normal mood and affect. His behavior is normal.  Nursing note and vitals reviewed.   ED Course  Procedures (including critical care time) Labs Review Labs Reviewed - No data to display  Imaging Review No results found.   EKG Interpretation None      MDM   Final diagnoses:  SOB (shortness of breath)  Tracheostomy complication, unspecified tracheostomy complication    Patient presenting today with feeling like his trach is plugged. He is satting 98% on room air with slight tachypnea. No significant drainage from the trach. RT called for suctioning. Unclear when patient last had his trach changed.  Patient has a temperature of 100.1 but denies fever or new infectious symptoms.  8:11 AM RT was able to get a moderate amount of mucous out with improvement in stridor.    8:48 AM After repetitive suctioning patient feels much better. He feels that he is breathing better. Oxygen saturations remained normal and tachypnea has improved. Had ordered a chest x-ray however patient states he has a follow-up appointment with his doctor at 10:00 and is requesting discharge.  Blanchie Dessert, MD 05/15/14 [id number]  Blanchie Dessert, MD 05/15/14 [phone]

## 2014-05-15 NOTE — ED Notes (Signed)
Pt requesting to leave to go to dr's appt-- discharge instructions given

## 2014-05-15 NOTE — Progress Notes (Signed)
Pt stated that He has had trach for 14-15 years and has been suctioning himself for that long and knows how to do it better. Pt stated that he would like a pair of gloves and some suction to be able to suction himself. Per MD, OK for pt to suction himself if RT at bedside. Pt wants inner cannula taken out when suctioning and then places suction catheter down  And holds it down there for approximately 1 min while suctioning. RT explained to pt that can cause more trauma and that he should not do it that way. Pt's blood pressure increased drastically. RT insisted pt take suction cather out and discontinue suction at this time. Inner cannula placed back in and secured. RT will continue to monitor.

## 2014-05-21 ENCOUNTER — Telehealth (HOSPITAL_COMMUNITY): Payer: Self-pay | Admitting: Radiology

## 2014-06-01 ENCOUNTER — Other Ambulatory Visit: Payer: Self-pay | Admitting: *Deleted

## 2014-06-01 MED ORDER — IBUPROFEN 400 MG PO TABS: 400.0000 mg | ORAL_TABLET | Freq: Three times a day (TID) | ORAL | Status: DC

## 2014-06-04 ENCOUNTER — Ambulatory Visit (INDEPENDENT_AMBULATORY_CARE_PROVIDER_SITE_OTHER): Payer: Medicare Other | Admitting: Nurse Practitioner

## 2014-06-04 ENCOUNTER — Telehealth: Payer: Self-pay

## 2014-06-04 ENCOUNTER — Encounter: Payer: Self-pay | Admitting: Nurse Practitioner

## 2014-06-04 VITALS — BP 130/88 | HR 61 | Temp 98.5°F | Resp 18 | Ht 69.0 in | Wt 167.0 lb

## 2014-06-04 DIAGNOSIS — E119 Type 2 diabetes mellitus without complications: Secondary | ICD-10-CM | POA: Diagnosis not present

## 2014-06-04 DIAGNOSIS — N400 Enlarged prostate without lower urinary tract symptoms: Secondary | ICD-10-CM

## 2014-06-04 DIAGNOSIS — I10 Essential (primary) hypertension: Secondary | ICD-10-CM | POA: Diagnosis not present

## 2014-06-04 DIAGNOSIS — R609 Edema, unspecified: Secondary | ICD-10-CM | POA: Diagnosis not present

## 2014-06-04 DIAGNOSIS — R197 Diarrhea, unspecified: Secondary | ICD-10-CM

## 2014-06-04 MED ORDER — IBUPROFEN 400 MG PO TABS: 400.0000 mg | ORAL_TABLET | Freq: Three times a day (TID) | ORAL | Status: DC

## 2014-06-04 MED ORDER — ZOSTER VACCINE LIVE 19400 UNT/0.65ML ~~LOC~~ SOLR: 0.6500 mL | Freq: Once | SUBCUTANEOUS | Status: DC

## 2014-06-04 MED ORDER — TERAZOSIN HCL 1 MG PO CAPS: 1.0000 mg | ORAL_CAPSULE | Freq: Every day | ORAL | Status: DC

## 2014-06-04 NOTE — Telephone Encounter (Signed)
Per Sherrie Mustache, NP mail patient Living Will/HPOA. Mailed

## 2014-06-04 NOTE — Progress Notes (Signed)
Patient ID: Nicholas Cobb, male   DOB: 07/31/1950, 64 y.o.   MRN: [id number]    PCP: [name], NP  Allergies  Allergen Reactions  . Codeine Nausea And Vomiting and Rash    Chief Complaint  Patient presents with  . Acute Visit    Swelling in right foot since last year   . 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 Advance Directives    Go over AD/POA paperwork     HPI: Patient is a 64 y.o. male seen in the office today for swelling in the right leg. Pt has had chronic LE edema. Doppler was obtained to rule out DVT. Pt currently on norvasc and ibuprofen which could be contributing to edema.  Pt was instruction to stop ibuprofen due to age,diarrhea and edema-- pt reports he stopped medication but jaw was so stiff he had to start medication back.  Pt currently with GI referral but has not called back to make appt. conts with diarrhea- reports he has a surgery follow up due to PED tube and wants to follow up with surgeon first  Pt with ongoing prostate complaints-- reports known history of BPH. Ongoing increased urination at night.    Advanced Directive information Does patient have an advance directive?: No, Would patient like information on creating an advanced directive?: Yes - Educational materials given Review of Systems:  Review of Systems  Constitutional: Negative for fever, chills and activity change.  HENT: Positive for drooling, facial swelling (chronic right sided facial swelling after radiation) and trouble swallowing (has PEG tube for feeding). Negative for congestion and sinus pressure.   Respiratory: Negative for cough, shortness of breath (at times) and wheezing.   Cardiovascular: Positive for leg swelling (right leg edema). Negative for chest pain.  Gastrointestinal: Negative for abdominal pain and abdominal distention.  Genitourinary: Positive for frequency. Negative for flank pain and difficulty  urinating.  Musculoskeletal: Negative for myalgias and arthralgias.  Neurological: Positive for facial asymmetry (chronic after radiation) and speech difficulty (has trach). Negative for dizziness.    Past Medical History  Diagnosis Date  . Hearing loss   . Change in voice   . Rash   . Trouble swallowing   . Difficulty urinating   . Cervical spondylosis 03/25/2011  . Emphysema 03/25/2011  . GERD (gastroesophageal reflux disease) 03/25/2011  . Chronic pain 03/25/2011  . 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 Xerostomia 03/25/2011  . Ulcer   . UTI (lower urinary tract infection)   . Allergy   . Chronic sinusitis 04/27/2011  . Impaired glucose tolerance 04/27/2011  . Cervical spondylolysis 04/30/2011    severe  . Cancer 1999    throat cancer  . Essential hypertension, benign 12/03/2012  . Type II or unspecified type diabetes mellitus without mention of complication, uncontrolled 04/27/2011  . Depression    Past Surgical History  Procedure Laterality Date  . Peg tube placement  11/1999  . Tracheostomy  11/1999  . Gastrostomy tube placement     Social History:   reports that he quit smoking about 17 years ago. He has never used smokeless tobacco. He reports that he does not drink alcohol or use illicit drugs.  Family History  Problem Relation Age of Onset  . Stroke Other   . Diabetes Other   . Cancer Other     lung cancer  . Diabetes Mother   . Heart disease Father  Medications: Patient's Medications  New Prescriptions   TERAZOSIN (HYTRIN) 1 MG CAPSULE    Take 1 capsule (1 mg total) by mouth at bedtime. For blood pressure and prostate  Previous Medications   NAPHAZOLINE-PHENIRAMINE (NAPHCON-A) 0.025-0.3 % OPHTHALMIC SOLUTION    Place 2 drops into both eyes 4 (four) times daily as needed for irritation (red eye).   NUTRITIONAL SUPPLEMENTS (FEEDING SUPPLEMENT, JEVITY 1.2 CAL,) LIQD    Place 237 mLs into feeding tube daily. Pt takes 2 cans at breakfast, 2 cans at lunch, 2 cans at  dinner and 2 at night   OXYMETAZOLINE (12 HOUR NASAL SPRAY) 0.05 % NASAL SPRAY    Place 2 sprays into the nose 2 (two) times daily as needed for congestion.    RANITIDINE (ZANTAC) 150 MG TABLET    Take 150 mg by mouth 2 (two) times daily.   TRIAMCINOLONE CREAM (KENALOG) 0.1 %    Apply 1 application topically 2 (two) times daily.  Modified Medications   Modified Medication Previous Medication   IBUPROFEN (ADVIL,MOTRIN) 400 MG TABLET ibuprofen (ADVIL,MOTRIN) 400 MG tablet      Take 1 tablet (400 mg total) by mouth 3 (three) times daily.    Take 1 tablet (400 mg total) by mouth 3 (three) times daily.   ZOSTER VACCINE LIVE, PF, (ZOSTAVAX) 51884 UNT/0.65ML INJECTION zoster vaccine live, PF, (ZOSTAVAX) 16606 UNT/0.65ML injection      Inject 19,400 Units into the skin once.    Inject 0.65 mLs into the skin once.  Discontinued Medications   AMLODIPINE (NORVASC) 2.5 MG TABLET    take 1 tablet by mouth once daily   SULFAMETHOXAZOLE-TRIMETHOPRIM (SMZ-TMP DS) 800-160 MG PER TABLET    Take 1 tablet by mouth every 12 (twelve) hours.     Physical Exam:  Filed Vitals:   06/04/14 0929  BP: 130/88  Pulse: 61  Temp: 98.5 F (36.9 C)  TempSrc: Oral  Resp: 18  Height: 5\' 9"  (1.753 m)  Weight: 167 lb (75.751 kg)  SpO2: 99%    Physical Exam  Constitutional: He is oriented to person, place, and time. He appears well-developed and well-nourished. No distress.  HENT:  Head: Normocephalic and atraumatic.  Mouth/Throat: Oropharynx is clear and moist. No oropharyngeal exudate.  Chronic right side facial swelling after radiation.   Eyes: Conjunctivae and EOM are normal. Pupils are equal, round, and reactive to light.  Neck: Normal range of motion.  Trach intact  Cardiovascular: Normal rate, regular rhythm and normal heart sounds.   Pulmonary/Chest: Effort normal. He exhibits no tenderness.  Abdominal: Soft. Bowel sounds are normal. He exhibits no distension. There is no tenderness. There is no rebound  and no guarding.  PEG tube  Musculoskeletal: He exhibits edema. He exhibits no tenderness.  Edema to right LE  Neurological: He is alert and oriented to person, place, and time.  Skin: Skin is warm and dry. He is not diaphoretic.  Psychiatric: He has a normal mood and affect.    Labs reviewed: Basic Metabolic Panel:  Recent Labs  10/29/13 1118 03/24/14 0835 03/28/14 0224  NA 135 136 133*  K 4.9 4.7 4.3  CL 98 94* 97  CO2 28 28  --   GLUCOSE 91 77 208*  BUN 18 19 22   CREATININE 1.1 1.01 1.10  CALCIUM 9.5 9.5  --   TSH 2.70  --   --    Liver Function Tests:  Recent Labs  10/29/13 1118 03/24/14 0835  AST 42* 30  ALT 25  23  ALKPHOS 119* 118*  BILITOT 0.3 0.4  PROT 7.3 7.0  ALBUMIN 4.1  --    No results for input(s): LIPASE, AMYLASE in the last 8760 hours. No results for input(s): AMMONIA in the last 8760 hours. CBC:  Recent Labs  10/29/13 1118 03/24/14 0835 03/28/14 0212 03/28/14 0224  WBC 5.9 4.2 5.0  --   NEUTROABS  --  2.0 2.5  --   HGB 14.6 14.7 13.9 15.6  HCT 44.1 42.5 41.9 46.0  MCV 91.4 90 92.3  --   PLT 243.0 249 195  --    Lipid Panel:  Recent Labs  10/29/13 1118  CHOL 172  HDL 33.40*  TRIG 208.0*  CHOLHDL 5  LDLDIRECT 116.0   TSH:  Recent Labs  10/29/13 1118  TSH 2.70   A1C: Lab Results  Component Value Date   HGBA1C 6.6* 03/24/2014     Assessment/Plan 1. Diarrhea -ongoing, GI referral done but pt has not called pt, educated pt that ibuprofen could also be contributing to diarrhea and GI discomfort but pt reports he NEEDS ibuprofen due to jaw stiffness from radiation, advise to take tylenol elixir but pt reports this does not help and it is too expensive.    2. Edema -chronic right LE edema, could be due to venous insufficiency vs medication side effect. Reports he will cont to take ibuprofen however advised against this. Will stop norvasc due to potential side effect and start terazosin to help with BP and BPH   3.  Essential hypertension, benign -stop norvasc due to side effect - terazosin (HYTRIN) 1 MG capsule; Take 1 capsule (1 mg total) by mouth at bedtime. For blood pressure and prostate  Dispense: 30 capsule; Refill: 3  4. BPH (benign prostatic hyperplasia) -chronic issue but not current on medication, will start terazosin  - terazosin (HYTRIN) 1 MG capsule; Take 1 capsule (1 mg total) by mouth at bedtime. For blood pressure and prostate  Dispense: 30 capsule; Refill: 3  5. Type 2 diabetes mellitus without complication Lab Results  Component Value Date   HGBA1C 6.6* 03/24/2014   Pt currently not on medication, uses jevity for feeding  - Ambulatory referral to Ophthalmology  Follow up in 4 weeks for evaluation of blood pressure, BPH and edema

## 2014-06-04 NOTE — Patient Instructions (Addendum)
STOP NORVASC (amlodipine)-- medications can be contributing to the swelling in your legs  START TERAZOSIN 1 mg by mouth at night (prescription has been sent to your pharmacy)-- this is for blood pressure and prostate

## 2014-06-05 ENCOUNTER — Encounter: Payer: Self-pay | Admitting: Gastroenterology

## 2014-06-10 ENCOUNTER — Other Ambulatory Visit: Payer: Self-pay

## 2014-06-10 ENCOUNTER — Other Ambulatory Visit: Payer: Self-pay | Admitting: Internal Medicine

## 2014-06-10 MED ORDER — IBUPROFEN 400 MG PO TABS: 400.0000 mg | ORAL_TABLET | Freq: Three times a day (TID) | ORAL | Status: DC

## 2014-06-11 ENCOUNTER — Other Ambulatory Visit: Payer: Self-pay | Admitting: *Deleted

## 2014-06-11 MED ORDER — IBUPROFEN 400 MG PO TABS: 400.0000 mg | ORAL_TABLET | Freq: Three times a day (TID) | ORAL | Status: DC

## 2014-06-11 NOTE — Telephone Encounter (Signed)
[facility] 

## 2014-06-30 ENCOUNTER — Ambulatory Visit (HOSPITAL_COMMUNITY)
Admission: RE | Admit: 2014-06-30 | Discharge: 2014-06-30 | Disposition: A | Discharge status: Home / Self Care | Payer: Medicare Other | Source: Ambulatory Visit | Attending: Acute Care | Admitting: Acute Care

## 2014-06-30 ENCOUNTER — Other Ambulatory Visit: Payer: Self-pay | Admitting: Acute Care

## 2014-06-30 DIAGNOSIS — L03211 Cellulitis of face: Secondary | ICD-10-CM | POA: Insufficient documentation

## 2014-06-30 DIAGNOSIS — Z930 Tracheostomy status: Secondary | ICD-10-CM | POA: Diagnosis present

## 2014-06-30 DIAGNOSIS — Z430 Encounter for attention to tracheostomy: Secondary | ICD-10-CM | POA: Diagnosis not present

## 2014-06-30 DIAGNOSIS — M272 Inflammatory conditions of jaws: Secondary | ICD-10-CM | POA: Insufficient documentation

## 2014-06-30 DIAGNOSIS — K219 Gastro-esophageal reflux disease without esophagitis: Secondary | ICD-10-CM | POA: Diagnosis not present

## 2014-06-30 DIAGNOSIS — K130 Diseases of lips: Secondary | ICD-10-CM | POA: Diagnosis not present

## 2014-06-30 MED ORDER — DOXYCYCLINE MONOHYDRATE 100 MG PO TABS: 100.0000 mg | ORAL_TABLET | Freq: Two times a day (BID) | ORAL | Status: DC

## 2014-06-30 MED ORDER — CIPROFLOXACIN HCL 500 MG PO TABS: 500.0000 mg | ORAL_TABLET | Freq: Two times a day (BID) | ORAL | Status: DC

## 2014-06-30 NOTE — Progress Notes (Signed)
       Expand All Collapse All       CC: Routine trach change   HPI:  64 yrs old AAM, s/p tracheostomy 14 years ago by Dr Ardis Hughs for osteoradionecrosis of jaw, throat CA (1999), and GERD. He actually had an emergency trach 2 years after neck surgery and radiation. He uses peg normally but supplements PO with ensure at times. Does not use PMV now. Plugs trach hand and speaks. Uses prox 6 extra long.Presents to [facility] for routine change. Last visit 2/4. Presents today for trach change. He has no new complaints except for the fact that when he woke up this morning his face had much more swelling than usual. He denied fever, chills, but did note more facial pain. He tells me the last time this happened he was treated for a infection/mucocytis related to his radiation. Not sure that this is the same process by looking at care every where but it does look like a cellulitis.   Review of Systems:  New facial swelling and pain to right face   pressure 176/99, pulse 71, respirations 16 and pulse oximetry 96% General appearance: 64 Year old AAM Mouth: membranes and no mucosal ulcerations; neck w/ limited mobility and jaw w/ post-op chronic deformity, much more swollen than baseline. Some pain to palp  Neck: Trachea midline, # 6 trach, uncuffed trach stoma crusted but unremarkable.  Lungs: CTA, with normal respiratory effort and no intercostal retractions CV: RRR, no MRGs  Abdomen: Soft, non-tender; no masses or HSM Extremities: No peripheral edema or extremity lymphadenopathy Skin: Normal temperature, turgor and texture; no rash, ulcers or subcutaneous nodules Psych: Appropriate affect, alert and oriented to person, place and time  Date  Last Seen by:  Lurline Idol size Trach change  pmv  Capping  Stoma assessment  Diet  Goals   11/3 Nicholas Cobb shiley 6OXLTUP (6 prox XLT) yes No, uses finger occlusion  n/a Unremarkable  PEG w/ oral supplementation  rov  3 months   2/4 Nicholas Cobb shiley 6OXLTUP (6 prox XLT) yes Finger occlusion  Na  Unremarkable, but seems as though facial swelling worse   ROV 3 months   5/17 Nicholas Cobb Same as above yes above na unremarkable  ROV 3 months    Tracheostomy status in setting of head and neck cancer. Trach changed (6 prox XLT) w/out issue.  Plan ROV 3 months.  Call PRN   New Right Facial cellulitis Plan Will give him cipro 500mg  bid x10d and doxy 100mg  po bid x 10d Have advised him that he needs to see his ENT in the next week/ASAP as I think he may need further imaging in case there is abscess Also advised him that if there is no improvement in the next 48hrs he needs to report to ER.   [name] ACNP-BC [place] Pager # [phone] OR # [phone] if no answer

## 2014-06-30 NOTE — Progress Notes (Signed)
Tracheostomy Procedure Note  BALEY LORIMER [id number] 04-Aug-1950  Pre Procedure Tracheostomy Information  Trach Brand: Shiley Size: 6.0 xl proximal Style: Proximal Secured by: Velcro   Procedure:  6.0 XL proximal trach removed, 6.0 XL proximal trach placed without difficulty.      Post Procedure Tracheostomy Information  Trach Brand: Shiley Size: 6.0 XL Style: Proximal Secured by: Velcro   Post Procedure Evaluation:  ETCO2 positive color change from yellow to purple : Yes.   Vital signs:blood pressure 176/99, pulse 71, respirations 16 and pulse oximetry 96% Patients current condition: stable Complications: No apparent complications Trach site exam: clean, dry Wound care done: dry Patient did tolerate procedure well.  Post change vitals: HR 75, SpO2 98%, RR 16, BP 205/123, Temperature 98.5 oral  Discharge vitals:  HR 69, SpO2 95%, RR 18, BP 201/114         Prescription needs: Prescribed antibiotic for facial swelling

## 2014-07-06 ENCOUNTER — Other Ambulatory Visit: Payer: Self-pay | Admitting: *Deleted

## 2014-07-06 MED ORDER — IBUPROFEN 400 MG PO TABS: 400.0000 mg | ORAL_TABLET | Freq: Three times a day (TID) | ORAL | Status: DC

## 2014-07-06 NOTE — Telephone Encounter (Signed)
[facility] 

## 2014-07-10 ENCOUNTER — Ambulatory Visit: Payer: Medicare Other | Admitting: Internal Medicine

## 2014-07-15 ENCOUNTER — Ambulatory Visit (HOSPITAL_COMMUNITY): Payer: Medicare Other

## 2014-07-16 ENCOUNTER — Other Ambulatory Visit: Payer: Self-pay | Admitting: *Deleted

## 2014-07-16 MED ORDER — IBUPROFEN 400 MG PO TABS: 400.0000 mg | ORAL_TABLET | Freq: Three times a day (TID) | ORAL | Status: DC

## 2014-07-16 NOTE — Telephone Encounter (Signed)
[facility] 

## 2014-07-31 ENCOUNTER — Ambulatory Visit: Payer: Medicare Other | Admitting: Internal Medicine

## 2014-08-06 ENCOUNTER — Ambulatory Visit (INDEPENDENT_AMBULATORY_CARE_PROVIDER_SITE_OTHER): Payer: Medicare Other | Admitting: Nurse Practitioner

## 2014-08-06 ENCOUNTER — Encounter: Payer: Self-pay | Admitting: Nurse Practitioner

## 2014-08-06 VITALS — BP 160/100 | HR 80 | Temp 98.1°F | Resp 22 | Ht 69.0 in | Wt 167.4 lb

## 2014-08-06 DIAGNOSIS — N400 Enlarged prostate without lower urinary tract symptoms: Secondary | ICD-10-CM | POA: Diagnosis not present

## 2014-08-06 DIAGNOSIS — Z931 Gastrostomy status: Secondary | ICD-10-CM | POA: Diagnosis not present

## 2014-08-06 DIAGNOSIS — C329 Malignant neoplasm of larynx, unspecified: Secondary | ICD-10-CM

## 2014-08-06 DIAGNOSIS — I10 Essential (primary) hypertension: Secondary | ICD-10-CM | POA: Diagnosis not present

## 2014-08-06 DIAGNOSIS — G8929 Other chronic pain: Secondary | ICD-10-CM | POA: Diagnosis not present

## 2014-08-06 MED ORDER — LOSARTAN POTASSIUM 50 MG PO TABS: 50.0000 mg | ORAL_TABLET | Freq: Every day | ORAL | Status: DC

## 2014-08-06 MED ORDER — TAMSULOSIN HCL 0.4 MG PO CAPS: 0.4000 mg | ORAL_CAPSULE | Freq: Every day | ORAL | Status: DC

## 2014-08-06 MED ORDER — IBUPROFEN 400 MG PO TABS: 400.0000 mg | ORAL_TABLET | Freq: Three times a day (TID) | ORAL | Status: DC

## 2014-08-06 NOTE — Patient Instructions (Signed)
STOP hytrin  START flomax 0.4 mg daily for prostate (take at night)  Losartan 50 mg daily for blood pressure (take in the morning)  Follow up in 4 weeks on blood pressure and prostate

## 2014-08-06 NOTE — Progress Notes (Signed)
Patient ID: Nicholas Cobb, male   DOB: 04-13-1950, 64 y.o.   MRN: [id number]    PCP: [name], NP  Allergies  Allergen Reactions  . Codeine Nausea And Vomiting and Rash    Chief Complaint  Patient presents with  . Medical Management of Chronic Issues    RV Meds.,labs printed , sign form     HPI: Patient is a 64 y.o. male seen in the office today for follow up on blood pressure, BPH and swelling norvasc was stopped at last visit and started on hytrin 1 mg daily Reports this is not working well-- for BPH or blood pressure, does not take blood pressure at home -getting up at night more to go to the bathroom  Swelling is a little better  Ran out of tube feeding supplies, has form to be signed and faxed back to [employer]  No recent breathing issues -- goes to [facility] if he has a problem   Needs ibuprofen to help with neck and jaw stiffness, aware of side effects/adverse effects   Review of Systems:  Review of Systems  Constitutional: Negative for fever, chills and activity change.  HENT: Positive for drooling, facial swelling (chronic right sided facial swelling after radiation) and trouble swallowing (has PEG tube for feeding). Negative for congestion and sinus pressure.   Respiratory: Negative for cough, shortness of breath (at times) and wheezing.   Cardiovascular: Positive for leg swelling (right leg edema). Negative for chest pain.  Gastrointestinal: Negative for abdominal pain and abdominal distention.  Genitourinary: Positive for frequency. Negative for flank pain and difficulty urinating.  Musculoskeletal: Negative for myalgias and arthralgias.  Neurological: Positive for facial asymmetry (chronic after radiation) and speech difficulty (has trach). Negative for dizziness.    Past Medical History  Diagnosis Date  . Hearing loss   . Change in voice   . Rash   . Trouble swallowing   . Difficulty urinating   . Cervical spondylosis 03/25/2011  . Emphysema  03/25/2011  . GERD (gastroesophageal reflux disease) 03/25/2011  . Chronic pain 03/25/2011  . 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 Xerostomia 03/25/2011  . Ulcer   . UTI (lower urinary tract infection)   . Allergy   . Chronic sinusitis 04/27/2011  . Impaired glucose tolerance 04/27/2011  . Cervical spondylolysis 04/30/2011    severe  . Cancer 1999    throat cancer  . Essential hypertension, benign 12/03/2012  . Type II or unspecified type diabetes mellitus without mention of complication, uncontrolled 04/27/2011  . Depression    Past Surgical History  Procedure Laterality Date  . Peg tube placement  11/1999  . Tracheostomy  11/1999  . Gastrostomy tube placement     Social History:   reports that he quit smoking about 17 years ago. He has never used smokeless tobacco. He reports that he does not drink alcohol or use illicit drugs.  Family History  Problem Relation Age of Onset  . Stroke Other   . Diabetes Other   . Cancer Other     lung cancer  . Diabetes Mother   . Heart disease Father     Medications: Patient's Medications  New Prescriptions   No medications on file  Previous Medications   IBUPROFEN (ADVIL,MOTRIN) 400 MG TABLET    Take 1 tablet (400 mg total) by mouth 3 (three) times daily. As needed for pain   NAPHAZOLINE-PHENIRAMINE (NAPHCON-A) 0.025-0.3 % OPHTHALMIC SOLUTION    Place 2 drops into both eyes 4 (four) times  daily as needed for irritation (red eye).   NUTRITIONAL SUPPLEMENTS (FEEDING SUPPLEMENT, JEVITY 1.2 CAL,) LIQD    Place 237 mLs into feeding tube daily. Pt takes 2 cans at breakfast, 2 cans at lunch, 2 cans at dinner and 2 at night   OXYMETAZOLINE (12 HOUR NASAL SPRAY) 0.05 % NASAL SPRAY    Place 2 sprays into the nose 2 (two) times daily as needed for congestion.    RANITIDINE (ZANTAC) 150 MG TABLET    Take 150 mg by mouth 2 (two) times daily.   TERAZOSIN (HYTRIN) 1 MG CAPSULE    Take 1 capsule (1 mg total) by mouth at bedtime. For blood pressure and prostate    TRIAMCINOLONE CREAM (KENALOG) 0.1 %    Apply 1 application topically 2 (two) times daily.   ZOSTER VACCINE LIVE, PF, (ZOSTAVAX) 23557 UNT/0.65ML INJECTION    Inject 19,400 Units into the skin once.  Modified Medications   No medications on file  Discontinued Medications   CIPROFLOXACIN (CIPRO) 500 MG TABLET    Take 1 tablet (500 mg total) by mouth 2 (two) times daily.   DOXYCYCLINE (ADOXA) 100 MG TABLET    Take 1 tablet (100 mg total) by mouth 2 (two) times daily.     Physical Exam:  Filed Vitals:   08/06/14 1547  BP: 160/100  Pulse: 80  Temp: 98.1 F (36.7 C)  TempSrc: Oral  Resp: 22  Height: 5\' 9"  (1.753 m)  Weight: 167 lb 6.4 oz (75.932 kg)  SpO2: 94%    Physical Exam  Constitutional: He is oriented to person, place, and time. He appears well-developed and well-nourished. No distress.  HENT:  Head: Normocephalic and atraumatic.  Mouth/Throat: Oropharynx is clear and moist. No oropharyngeal exudate.  Chronic right side facial swelling after radiation.   Eyes: Conjunctivae and EOM are normal. Pupils are equal, round, and reactive to light.  Neck: Normal range of motion.  Trach intact  Cardiovascular: Normal rate, regular rhythm and normal heart sounds.   Pulmonary/Chest: Effort normal. He exhibits no tenderness.  Abdominal: Soft. Bowel sounds are normal. He exhibits no distension. There is no tenderness. There is no rebound and no guarding.  PEG tube  Musculoskeletal: He exhibits no edema or tenderness.  Neurological: He is alert and oriented to person, place, and time.  Skin: Skin is warm and dry. He is not diaphoretic.  Psychiatric: He has a normal mood and affect.    Labs reviewed: Basic Metabolic Panel:  Recent Labs  10/29/13 1118 03/24/14 0835 03/28/14 0224  NA 135 136 133*  K 4.9 4.7 4.3  CL 98 94* 97  CO2 28 28  --   GLUCOSE 91 77 208*  BUN 18 19 22   CREATININE 1.1 1.01 1.10  CALCIUM 9.5 9.5  --   TSH 2.70  --   --    Liver Function  Tests:  Recent Labs  10/29/13 1118 03/24/14 0835  AST 42* 30  ALT 25 23  ALKPHOS 119* 118*  BILITOT 0.3 0.4  PROT 7.3 7.0  ALBUMIN 4.1  --    No results for input(s): LIPASE, AMYLASE in the last 8760 hours. No results for input(s): AMMONIA in the last 8760 hours. CBC:  Recent Labs  10/29/13 1118 03/24/14 0835 03/28/14 0212 03/28/14 0224  WBC 5.9 4.2 5.0  --   NEUTROABS  --  2.0 2.5  --   HGB 14.6 14.7 13.9 15.6  HCT 44.1 42.5 41.9 46.0  MCV 91.4 90 92.3  --  PLT 243.0 249 195  --    Lipid Panel:  Recent Labs  10/29/13 1118  CHOL 172  HDL 33.40*  TRIG 208.0*  CHOLHDL 5  LDLDIRECT 116.0   TSH:  Recent Labs  10/29/13 1118  TSH 2.70   A1C: Lab Results  Component Value Date   HGBA1C 6.6* 03/24/2014     Assessment/Plan 1. Essential hypertension, benign -elevated today, will dc hytrin and start cozaar 50 mg daily  - losartan (COZAAR) 50 MG tablet; Take 1 tablet (50 mg total) by mouth daily.  Dispense: 30 tablet; Refill: 3 - CBC with Differential; Future - Comprehensive metabolic panel; Future  2. BPH (benign prostatic hyperplasia) -dc hytrin  - tamsulosin (FLOMAX) 0.4 MG CAPS capsule; Take 1 capsule (0.4 mg total) by mouth daily.  Dispense: 30 capsule; Refill: 3  3. Laryngeal cancer -following with [facility], s/p PEG and Trach  -now requiring tube feeding for nutritional support   4. Chronic pain -dicussed side effects (LE swelling) and adverse effects (GI symptoms and renal effects) of chronic use of NSAIDs, pt reports he is aware and needs refill because nothing else helps -will refill ibuprofen at this time  5. S/P percutaneous endoscopic gastrostomy (PEG) tube placement - forms completed, signed and faxed for nutritional supplements  Follow up in 4 weeks on blood pressure and bph, lab work prior to visit    [name]. Harle Battiest  [facility] [phone] 8 am - 5 pm) [phone] (after hours)

## 2014-08-10 ENCOUNTER — Telehealth: Payer: Self-pay | Admitting: *Deleted

## 2014-08-10 NOTE — Telephone Encounter (Signed)
Christianna with [employer] called and stated that they need patient's OV notes for his supplies faxed to [phone]. Stated that they received the paperwork Janett Billow filled out but didn't received the OV notes. Faxed.

## 2014-08-18 ENCOUNTER — Telehealth: Payer: Self-pay

## 2014-08-18 NOTE — Telephone Encounter (Signed)
Manual refill request was received from [facility] for Ibuprofen 400 mg.   RX last filled 08/06/14 #30, last OV 08/06/14. Pending appointment on 09/01/14  Please advise if ok to fill, if yes please indicate if dispense number to remain at 30 or higher

## 2014-08-19 MED ORDER — IBUPROFEN 400 MG PO TABS: 400.0000 mg | ORAL_TABLET | Freq: Three times a day (TID) | ORAL | Status: DC

## 2014-08-19 NOTE — Telephone Encounter (Signed)
Okay to refill with #90

## 2014-08-19 NOTE — Telephone Encounter (Signed)
RX sent

## 2014-08-27 ENCOUNTER — Other Ambulatory Visit: Payer: Medicare Other

## 2014-09-01 ENCOUNTER — Encounter: Payer: Self-pay | Admitting: Nurse Practitioner

## 2014-09-01 ENCOUNTER — Ambulatory Visit (INDEPENDENT_AMBULATORY_CARE_PROVIDER_SITE_OTHER): Payer: Medicare Other | Admitting: Nurse Practitioner

## 2014-09-01 VITALS — BP 128/72 | HR 73 | Temp 99.1°F | Resp 22 | Ht 69.0 in | Wt 166.4 lb

## 2014-09-01 DIAGNOSIS — N400 Enlarged prostate without lower urinary tract symptoms: Secondary | ICD-10-CM

## 2014-09-01 DIAGNOSIS — I10 Essential (primary) hypertension: Secondary | ICD-10-CM

## 2014-09-01 DIAGNOSIS — C329 Malignant neoplasm of larynx, unspecified: Secondary | ICD-10-CM | POA: Diagnosis not present

## 2014-09-01 NOTE — Patient Instructions (Signed)
Cont current medication- Follow up in 3 months with Dr Eulas Post for routine follow up on chronic conditions Will get blood work today

## 2014-09-01 NOTE — Progress Notes (Signed)
Patient ID: Nicholas Cobb, male   DOB: [birth year], 64 y.o.   MRN: [id number]    PCP: [name], NP  Allergies  Allergen Reactions  . Codeine Nausea And Vomiting and Rash    Chief Complaint  Patient presents with  . Medical Management of Chronic Issues    3 week follow-up for blood pressure     HPI: Patient is a 64 y.o. male seen in the office today to follow up blood pressure and prostate. At last visit hytrin was stopped and Flomax and cozaar was added. Pt does not notice much change with flomax. Tolerating medications without side effects.  Has transportation form he wants filled out Got nutritional supplements after last visit.  Did not get blood work before visit-will get today  Review of Systems:  Review of Systems  Constitutional: Negative for fever, chills and activity change.  HENT: Positive for drooling, facial swelling (chronic right sided facial swelling after radiation) and trouble swallowing (has PEG tube for feeding). Negative for congestion and sinus pressure.   Respiratory: Negative for cough, shortness of breath (at times) and wheezing.   Cardiovascular: Negative for chest pain and leg swelling.  Gastrointestinal: Negative for abdominal pain and abdominal distention.  Genitourinary: Positive for frequency. Negative for flank pain and difficulty urinating.  Musculoskeletal: Negative for myalgias and arthralgias.  Neurological: Positive for facial asymmetry (chronic after radiation) and speech difficulty (has trach). Negative for dizziness.    Past Medical History  Diagnosis Date  . Hearing loss   . Change in voice   . Rash   . Trouble swallowing   . Difficulty urinating   . Cervical spondylosis 03/25/2011  . Emphysema 03/25/2011  . GERD (gastroesophageal reflux disease) 03/25/2011  . Chronic pain 03/25/2011  . 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 Xerostomia 03/25/2011  . Ulcer   . UTI (lower urinary tract infection)   . Allergy   . Chronic  sinusitis 04/27/2011  . Impaired glucose tolerance 04/27/2011  . Cervical spondylolysis 04/30/2011    severe  . Cancer 1999    throat cancer  . Essential hypertension, benign 12/03/2012  . Type II or unspecified type diabetes mellitus without mention of complication, uncontrolled 04/27/2011  . Depression    Past Surgical History  Procedure Laterality Date  . Peg tube placement  11/1999  . Tracheostomy  11/1999  . Gastrostomy tube placement     Social History:   reports that he quit smoking about 17 years ago. He has never used smokeless tobacco. He reports that he does not drink alcohol or use illicit drugs.  Family History  Problem Relation Age of Onset  . Stroke Other   . Diabetes Other   . Cancer Other     lung cancer  . Diabetes Mother   . Heart disease Father     Medications: Patient's Medications  New Prescriptions   No medications on file  Previous Medications   IBUPROFEN (ADVIL,MOTRIN) 400 MG TABLET    Take 1 tablet (400 mg total) by mouth 3 (three) times daily. As needed for pain   LOSARTAN (COZAAR) 50 MG TABLET    Take 1 tablet (50 mg total) by mouth daily.   NAPHAZOLINE-PHENIRAMINE (NAPHCON-A) 0.025-0.3 % OPHTHALMIC SOLUTION    Place 2 drops into both eyes 4 (four) times daily as needed for irritation (red eye).   NUTRITIONAL SUPPLEMENTS (FEEDING SUPPLEMENT, JEVITY 1.2 CAL,) LIQD    Place 237 mLs into feeding tube daily. Pt takes 2 cans at breakfast,  2 cans at lunch, 2 cans at dinner and 2 at night   OXYMETAZOLINE (12 HOUR NASAL SPRAY) 0.05 % NASAL SPRAY    Place 2 sprays into the nose 2 (two) times daily as needed for congestion.    RANITIDINE (ZANTAC) 150 MG TABLET    Take 150 mg by mouth 2 (two) times daily.   TAMSULOSIN (FLOMAX) 0.4 MG CAPS CAPSULE    Take 1 capsule (0.4 mg total) by mouth daily.   TRIAMCINOLONE CREAM (KENALOG) 0.1 %    Apply 1 application topically 2 (two) times daily.   ZOSTER VACCINE LIVE, PF, (ZOSTAVAX) 93903 UNT/0.65ML INJECTION    Inject  19,400 Units into the skin once.  Modified Medications   No medications on file  Discontinued Medications   No medications on file     Physical Exam:  Filed Vitals:   09/01/14 1428  BP: 128/72  Pulse: 73  Temp: 99.1 F (37.3 C)  TempSrc: Oral  Resp: 22  Height: 5\' 9"  (1.753 m)  Weight: 166 lb 6.4 oz (75.479 kg)  SpO2: 95%    Physical Exam  Constitutional: He is oriented to person, place, and time. He appears well-developed and well-nourished. No distress.  HENT:  Head: Normocephalic and atraumatic.  Mouth/Throat: Oropharynx is clear and moist. No oropharyngeal exudate.  Chronic right side facial swelling after radiation  Eyes: Conjunctivae and EOM are normal. Pupils are equal, round, and reactive to light.  Neck: Normal range of motion.  Trach intact  Cardiovascular: Normal rate, regular rhythm and normal heart sounds.   Pulmonary/Chest: Effort normal. He exhibits no tenderness.  Abdominal: Soft. Bowel sounds are normal.  PEG tube  Musculoskeletal: He exhibits no edema or tenderness.  Neurological: He is alert and oriented to person, place, and time.  Skin: Skin is warm and dry. He is not diaphoretic.  Psychiatric: He has a normal mood and affect.    Labs reviewed: Basic Metabolic Panel:  Recent Labs  10/29/13 1118 03/24/14 0835 03/28/14 0224  NA 135 136 133*  K 4.9 4.7 4.3  CL 98 94* 97  CO2 28 28  --   GLUCOSE 91 77 208*  BUN 18 19 22   CREATININE 1.1 1.01 1.10  CALCIUM 9.5 9.5  --   TSH 2.70  --   --    Liver Function Tests:  Recent Labs  10/29/13 1118 03/24/14 0835  AST 42* 30  ALT 25 23  ALKPHOS 119* 118*  BILITOT 0.3 0.4  PROT 7.3 7.0  ALBUMIN 4.1  --    No results for input(s): LIPASE, AMYLASE in the last 8760 hours. No results for input(s): AMMONIA in the last 8760 hours. CBC:  Recent Labs  10/29/13 1118 03/24/14 0835 03/28/14 0212 03/28/14 0224  WBC 5.9 4.2 5.0  --   NEUTROABS  --  2.0 2.5  --   HGB 14.6 14.7 13.9 15.6  HCT  44.1 42.5 41.9 46.0  MCV 91.4 90 92.3  --   PLT 243.0 249 195  --    Lipid Panel:  Recent Labs  10/29/13 1118  CHOL 172  HDL 33.40*  TRIG 208.0*  CHOLHDL 5  LDLDIRECT 116.0   TSH:  Recent Labs  10/29/13 1118  TSH 2.70   A1C: Lab Results  Component Value Date   HGBA1C 6.6* 03/24/2014     Assessment/Plan 1. Essential hypertension, benign -improved blood pressure today, will cont cozaar  -bmp and cbc today  2. BPH (benign prostatic hyperplasia) -stable, to cont flomax  3. Laryngeal cancer S/p trach and peg, requiring nutritional feeding, has gotten supplement.  [employer] sc[employer] application filled out for patient    Follow up in 3 months with Dr Eulas Post, sooner if needed Jasper K. Harle Battiest  [facility] [phone] 8 am - 5 pm) [phone] (after hours)

## 2014-09-02 LAB — COMPREHENSIVE METABOLIC PANEL
A/G RATIO: 1.6 (ref 1.1–2.5)
ALT: 15 IU/L (ref 0–44)
AST: 24 IU/L (ref 0–40)
Albumin: 4 g/dL (ref 3.6–4.8)
Alkaline Phosphatase: 110 IU/L (ref 39–117)
BUN/Creatinine Ratio: 17 (ref 10–22)
BUN: 16 mg/dL (ref 8–27)
Bilirubin Total: 0.2 mg/dL (ref 0.0–1.2)
CO2: 26 mmol/L (ref 18–29)
Calcium: 9.4 mg/dL (ref 8.6–10.2)
Chloride: 91 mmol/L — ABNORMAL LOW (ref 97–108)
Creatinine, Ser: 0.93 mg/dL (ref 0.76–1.27)
GFR, EST AFRICAN AMERICAN: 99 mL/min/{1.73_m2} (ref >59)
GFR, EST NON AFRICAN AMERICAN: 85 mL/min/{1.73_m2} (ref >59)
Globulin, Total: 2.5 g/dL (ref 1.5–4.5)
Glucose: 105 mg/dL — ABNORMAL HIGH (ref 65–99)
POTASSIUM: 4.7 mmol/L (ref 3.5–5.2)
Sodium: 131 mmol/L — ABNORMAL LOW (ref 134–144)
TOTAL PROTEIN: 6.5 g/dL (ref 6.0–8.5)

## 2014-09-02 LAB — CBC WITH DIFFERENTIAL/PLATELET
BASOS ABS: 0 10*3/uL (ref 0.0–0.2)
Basos: 0 %
EOS (ABSOLUTE): 0.1 10*3/uL (ref 0.0–0.4)
EOS: 2 %
HEMATOCRIT: 39.5 % (ref 37.5–51.0)
HEMOGLOBIN: 13.3 g/dL (ref 12.6–17.7)
IMMATURE GRANULOCYTES: 0 %
Immature Grans (Abs): 0 10*3/uL (ref 0.0–0.1)
Lymphocytes Absolute: 1.1 10*3/uL (ref 0.7–3.1)
Lymphs: 29 %
MCH: 30.2 pg (ref 26.6–33.0)
MCHC: 33.7 g/dL (ref 31.5–35.7)
MCV: 90 fL (ref 79–97)
Monocytes Absolute: 0.7 10*3/uL (ref 0.1–0.9)
Monocytes: 17 %
NEUTROS ABS: 2 10*3/uL (ref 1.4–7.0)
Neutrophils: 52 %
Platelets: 232 10*3/uL (ref 150–379)
RBC: 4.4 x10E6/uL (ref 4.14–5.80)
RDW: 14.6 % (ref 12.3–15.4)
WBC: 3.8 10*3/uL (ref 3.4–10.8)

## 2014-09-04 ENCOUNTER — Telehealth: Payer: Self-pay

## 2014-09-04 NOTE — Telephone Encounter (Signed)
Nicholas Cobb's daughter called after I had already mailed the letter for his lab results.  Spoke with her and told her that I left a message on the phone telling her that I would be mailing his lab results because we could not reach her father she was all right with this.

## 2014-09-21 ENCOUNTER — Other Ambulatory Visit: Payer: Self-pay | Admitting: *Deleted

## 2014-09-21 MED ORDER — IBUPROFEN 400 MG PO TABS: 400.0000 mg | ORAL_TABLET | Freq: Three times a day (TID) | ORAL | Status: DC

## 2014-09-21 NOTE — Telephone Encounter (Signed)
[facility] 

## 2014-09-30 ENCOUNTER — Encounter (HOSPITAL_COMMUNITY): Payer: Self-pay | Admitting: Emergency Medicine

## 2014-09-30 ENCOUNTER — Emergency Department (HOSPITAL_COMMUNITY)
Admission: EM | Admit: 2014-09-30 | Discharge: 2014-09-30 | Disposition: A | Discharge status: Home / Self Care | Payer: Medicare Other | Attending: Emergency Medicine | Admitting: Emergency Medicine

## 2014-09-30 DIAGNOSIS — Z87891 Personal history of nicotine dependence: Secondary | ICD-10-CM | POA: Insufficient documentation

## 2014-09-30 DIAGNOSIS — H9190 Unspecified hearing loss, unspecified ear: Secondary | ICD-10-CM | POA: Insufficient documentation

## 2014-09-30 DIAGNOSIS — K219 Gastro-esophageal reflux disease without esophagitis: Secondary | ICD-10-CM | POA: Diagnosis not present

## 2014-09-30 DIAGNOSIS — E119 Type 2 diabetes mellitus without complications: Secondary | ICD-10-CM | POA: Diagnosis not present

## 2014-09-30 DIAGNOSIS — G8929 Other chronic pain: Secondary | ICD-10-CM | POA: Insufficient documentation

## 2014-09-30 DIAGNOSIS — Z430 Encounter for attention to tracheostomy: Secondary | ICD-10-CM | POA: Insufficient documentation

## 2014-09-30 DIAGNOSIS — I10 Essential (primary) hypertension: Secondary | ICD-10-CM | POA: Diagnosis not present

## 2014-09-30 DIAGNOSIS — Z85819 Personal history of malignant neoplasm of unspecified site of lip, oral cavity, and pharynx: Secondary | ICD-10-CM | POA: Insufficient documentation

## 2014-09-30 DIAGNOSIS — Z79899 Other long term (current) drug therapy: Secondary | ICD-10-CM | POA: Insufficient documentation

## 2014-09-30 DIAGNOSIS — Z8659 Personal history of other mental and behavioral disorders: Secondary | ICD-10-CM | POA: Diagnosis not present

## 2014-09-30 DIAGNOSIS — Z87828 Personal history of other (healed) physical injury and trauma: Secondary | ICD-10-CM | POA: Insufficient documentation
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
## 2014-09-30 NOTE — ED Notes (Signed)
Pt ambulated into room with no difficulty.  Respiratory at bedside.
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
## 2014-09-30 NOTE — ED Notes (Signed)
Left Message for Case Management to call [phone] @ 2149.

## 2014-09-30 NOTE — Discharge Instructions (Signed)
Care of a Tracheostomy Tube °Keeping the tracheostomy tube clean helps prevent infections and keeps certain trach tubing from plugging. A tracheostomy tube is commonly known as a trach tube. You may have one tube (an outer cannula), or you may have two tubes (an outer and inner cannula). The inner cannula that fits inside the outer cannula is removed for cleaning or replacement. Follow your caregiver's directions as to how often you should change and clean your trach tube.  °SUPPLIES NEEDED °· Towel. °· Suction supplies. °· Sterile trach care kit. °¨ 4x4 inch (10x10 cm) gauze pads. °¨ Sterile cotton-tipped swabs. °¨ Sterile trach bandage (dressing). °¨ Sterile container. °¨ 0.9% saline solution. °¨ Small sterile brush (or disposable inner cannula). °¨ Roll of twill tape, trach ties, or trach holder. °¨ Scissors. °· Clean gloves. °· Sterile gloves. °TRACHEOSTOMY CARE  °1. Have all supplies ready and available. °2. Wash hands well. °3. Put on clean gloves. °4. Suction the trach tube as needed. °5. When suctioning is complete, remove soiled trach dressings, gloves, and suction catheter. Throw away the dressings and coiled catheter in the glove. To coil the catheter, roll the catheter around the fingers. Then, pull the glove off inside out so that catheter remains coiled in glove. °6. Wash hands well. °7. Put on sterile gloves. °8. Fill a container 0.9% saline solution. °9. Give oxygen as needed. °10. Clean the inner cannula. °Nondisposable inner cannula. °· While only touching the outer part of the trach tube, unlock and remove the inner cannula. °· Drop the inner cannula into 0.9% saline solution. The saline will loosen secretions. °· Replace the trach collar, trach tube, or ventilator oxygen source over the outer cannula. Do not attach the trach tube and ventilator oxygen devices to all outer cannulas when the inner cannula is removed. °· Quickly pick up the inner cannula out of the saline solution. Use a small brush  to remove the secretions on the inside and outside of the inner cannula. °· Hold the inner cannula over the container. Rinse the cannula with 0.9% saline solution. °· Replace the inner cannula. Secure the locking mechanism. °· Give oxygen as needed. °Disposable inner cannula. °· Remove the new cannula from the packaging. °· Take out the inner cannula while touching only the outer part of the trach tube. °· Replace the old cannula with the new cannula. Lock it into position. °· Throw away the old cannula. °· Give oxygen as needed. °11. Clean the outer cannula surfaces with gauze or cotton swabs, extending 2-4 inches (5-10 cm) in all directions under the neck plate. Using a cotton swab, clean the stoma site in a circular motion from the stoma site outward. °12. Dry the skin and the outer cannula by patting the area gently with a dry gauze pad. °13. Secure the trach tube with trach ties or a trach tube holder. °14. Place a sterile dressing around the trach site. °15. Give oxygen as needed. °16. Throw away any used supplies. °17. Remove gloves. °18. Wash hands well. °Document Released: 03/14/2006 Document Revised: 01/17/2012 Document Reviewed: 09/08/2011 °[employer]® Patient Information ©2015 [employer]. This information is not intended to replace advice given to you by your health care provider. Make sure you discuss any questions you have with your health care provider. ° °

## 2014-10-08 ENCOUNTER — Ambulatory Visit (INDEPENDENT_AMBULATORY_CARE_PROVIDER_SITE_OTHER): Payer: Medicare Other | Admitting: Nurse Practitioner

## 2014-10-08 ENCOUNTER — Encounter: Payer: Self-pay | Admitting: Nurse Practitioner

## 2014-10-08 VITALS — BP 116/86 | HR 76 | Temp 98.5°F | Resp 18 | Ht 69.0 in | Wt 166.4 lb

## 2014-10-08 DIAGNOSIS — R350 Frequency of micturition: Secondary | ICD-10-CM | POA: Diagnosis not present

## 2014-10-08 LAB — POCT URINALYSIS DIPSTICK
Bilirubin, UA: NEGATIVE
Glucose, UA: NEGATIVE
KETONES UA: NEGATIVE
LEUKOCYTES UA: NEGATIVE
Nitrite, UA: NEGATIVE
PH UA: 5
RBC UA: NEGATIVE
Spec Grav, UA: 1.01
Urobilinogen, UA: 0.2

## 2014-10-08 NOTE — Progress Notes (Signed)
Patient ID: Nicholas Cobb, male   DOB: [birth year], 64 y.o.   MRN: [id number]    PCP: [name], NP  Allergies  Allergen Reactions  . Codeine Nausea And Vomiting and Rash    Chief Complaint  Patient presents with  . Medical Management of Chronic Issues    was seen at ENT and was told to follow-up with his PCP, needing records sent     HPI: Patient is a 64 y.o. male seen in the office today due to issues with records to ENT, needing our records sent back to ENT. Pt also complaining of increased urination. Previously starting on flomax without good effects. Pt report he has had a hx of BPH. Saw urology back 9 years ago and gave him a medication that helped at little  Does not feel like he is completely emptying his bladder.  No pain with urination, no abdominal discomfort noted.   Advanced Directive information Does patient have an advance directive?: No, Would patient like information on creating an advanced directive?: Yes - Educational materials given Review of Systems:  Review of Systems  Constitutional: Negative for chills, activity change, appetite change and fatigue.  Genitourinary: Positive for frequency. Negative for dysuria, flank pain and difficulty urinating.    Past Medical History  Diagnosis Date  . Hearing loss   . Change in voice   . Rash   . Trouble swallowing   . Difficulty urinating   . Cervical spondylosis 03/25/2011  . Emphysema 03/25/2011  . GERD (gastroesophageal reflux disease) 03/25/2011  . Chronic pain 03/25/2011  . 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 Xerostomia 03/25/2011  . Ulcer   . UTI (lower urinary tract infection)   . Allergy   . Chronic sinusitis 04/27/2011  . Impaired glucose tolerance 04/27/2011  . Cervical spondylolysis 04/30/2011    severe  . Cancer 1999    throat cancer  . Essential hypertension, benign 12/03/2012  . Type II or unspecified type diabetes mellitus without mention of complication, uncontrolled 04/27/2011  .  Depression    Past Surgical History  Procedure Laterality Date  . Peg tube placement  11/1999  . Tracheostomy  11/1999  . Gastrostomy tube placement     Social History:   reports that he quit smoking about 17 years ago. He has never used smokeless tobacco. He reports that he does not drink alcohol or use illicit drugs.  Family History  Problem Relation Age of Onset  . Stroke Other   . Diabetes Other   . Cancer Other     lung cancer  . Diabetes Mother   . Heart disease Father     Medications: Patient's Medications  New Prescriptions   No medications on file  Previous Medications   IBUPROFEN (ADVIL,MOTRIN) 400 MG TABLET    Take 1 tablet (400 mg total) by mouth 3 (three) times daily. As needed for pain   LOSARTAN (COZAAR) 50 MG TABLET    Take 1 tablet (50 mg total) by mouth daily.   NAPHAZOLINE-PHENIRAMINE (NAPHCON-A) 0.025-0.3 % OPHTHALMIC SOLUTION    Place 2 drops into both eyes 4 (four) times daily as needed for irritation (red eye).   NUTRITIONAL SUPPLEMENTS (FEEDING SUPPLEMENT, JEVITY 1.2 CAL,) LIQD    Place 237 mLs into feeding tube See admin instructions. Pt takes 2 cans at breakfast, 2 cans at lunch, 2 cans at dinner and 2 at night   OXYMETAZOLINE (12 HOUR NASAL SPRAY) 0.05 % NASAL SPRAY    Place 2 sprays into  the nose 2 (two) times daily as needed for congestion.    RANITIDINE (ZANTAC) 150 MG TABLET    Take 150 mg by mouth 2 (two) times daily.   TAMSULOSIN (FLOMAX) 0.4 MG CAPS CAPSULE    Take 1 capsule (0.4 mg total) by mouth daily.   TRIAMCINOLONE CREAM (KENALOG) 0.1 %    Apply 1 application topically 2 (two) times daily.   ZOSTER VACCINE LIVE, PF, (ZOSTAVAX) 44818 UNT/0.65ML INJECTION    Inject 19,400 Units into the skin once.  Modified Medications   No medications on file  Discontinued Medications   No medications on file     Physical Exam:  Filed Vitals:   10/08/14 0829  BP: 116/86  Pulse: 76  Temp: 98.5 F (36.9 C)  TempSrc: Oral  Resp: 18  Height: 5\' 9"   (1.753 m)  Weight: 166 lb 6.4 oz (75.479 kg)    Physical Exam  Constitutional: He is oriented to person, place, and time. He appears well-developed and well-nourished. No distress.  HENT:  Head: Normocephalic and atraumatic.  Chronic right side facial swelling after radiation  Neck:  Trach intact  Cardiovascular: Normal rate, regular rhythm and normal heart sounds.   Pulmonary/Chest: Effort normal. He has decreased breath sounds. He exhibits no tenderness.  trach  Abdominal: Soft. Bowel sounds are normal.  PEG tube  Neurological: He is alert and oriented to person, place, and time.  Skin: Skin is warm and dry. He is not diaphoretic.  Psychiatric: He has a normal mood and affect.    Labs reviewed: Basic Metabolic Panel:  Recent Labs  10/29/13 1118 03/24/14 0835 03/28/14 0224 09/01/14 1453  NA 135 136 133* 131*  K 4.9 4.7 4.3 4.7  CL 98 94* 97 91*  CO2 28 28  --  26  GLUCOSE 91 77 208* 105*  BUN 18 19 22 16   CREATININE 1.1 1.01 1.10 0.93  CALCIUM 9.5 9.5  --  9.4  TSH 2.70  --   --   --    Liver Function Tests:  Recent Labs  10/29/13 1118 03/24/14 0835 09/01/14 1453  AST 42* 30 24  ALT 25 23 15   ALKPHOS 119* 118* 110  BILITOT 0.3 0.4 0.2  PROT 7.3 7.0 6.5  ALBUMIN 4.1  --   --    No results for input(s): LIPASE, AMYLASE in the last 8760 hours. No results for input(s): AMMONIA in the last 8760 hours. CBC:  Recent Labs  10/29/13 1118 03/24/14 0835 03/28/14 0212 03/28/14 0224 09/01/14 1453  WBC 5.9 4.2 5.0  --  3.8  NEUTROABS  --  2.0 2.5  --  2.0  HGB 14.6 14.7 13.9 15.6  --   HCT 44.1 42.5 41.9 46.0 39.5  MCV 91.4 90 92.3  --   --   PLT 243.0 249 195  --   --    Lipid Panel:  Recent Labs  10/29/13 1118  CHOL 172  HDL 33.40*  TRIG 208.0*  CHOLHDL 5  LDLDIRECT 116.0   TSH:  Recent Labs  10/29/13 1118  TSH 2.70   A1C: Lab Results  Component Value Date   HGBA1C 6.6* 03/24/2014     Assessment/Plan 1. Urinary frequency -UA  normal -will obtain PSA today -previously seen by urology, will send referral to urology for further evaluation and treatment of urinary frequency/BPH  Keep follow up with Dr Thayer Headings K. Harle Battiest  [facility] [phone] 8 am - 5 pm) [phone] (  after hours)  

## 2014-10-08 NOTE — Patient Instructions (Signed)
Keep follow up with Dr Eulas Post  Will refer to urology due to ongoing urinary frequency BPH

## 2014-10-09 LAB — PSA: Prostate Specific Ag, Serum: 0.8 ng/mL (ref 0.0–4.0)

## 2014-10-13 ENCOUNTER — Other Ambulatory Visit: Payer: Self-pay | Admitting: Internal Medicine

## 2014-10-13 ENCOUNTER — Other Ambulatory Visit: Payer: Self-pay | Admitting: *Deleted

## 2014-10-13 MED ORDER — IBUPROFEN 400 MG PO TABS: 400.0000 mg | ORAL_TABLET | Freq: Three times a day (TID) | ORAL | Status: DC

## 2014-10-13 NOTE — Telephone Encounter (Signed)
[facility] 

## 2014-10-15 ENCOUNTER — Other Ambulatory Visit: Payer: Self-pay | Admitting: Internal Medicine

## 2014-10-26 ENCOUNTER — Other Ambulatory Visit: Payer: Self-pay | Admitting: *Deleted

## 2014-10-26 MED ORDER — TRIAMCINOLONE ACETONIDE 0.1 % EX CREA: 1.0000 "application " | TOPICAL_CREAM | Freq: Two times a day (BID) | CUTANEOUS | Status: DC

## 2014-10-26 NOTE — Telephone Encounter (Signed)
[facility] 

## 2014-10-28 ENCOUNTER — Emergency Department (HOSPITAL_COMMUNITY)
Admission: EM | Admit: 2014-10-28 | Discharge: 2014-10-28 | Disposition: A | Discharge status: Other Acute Inpatient Hospital | Payer: Medicare Other | Attending: Emergency Medicine | Admitting: Emergency Medicine

## 2014-10-28 ENCOUNTER — Ambulatory Visit (HOSPITAL_COMMUNITY)
Admission: RE | Admit: 2014-10-28 | Discharge: 2014-10-28 | Disposition: A | Discharge status: Home / Self Care | Payer: Medicare Other | Source: Ambulatory Visit | Attending: Acute Care | Admitting: Acute Care

## 2014-10-28 ENCOUNTER — Encounter (HOSPITAL_COMMUNITY): Payer: Self-pay

## 2014-10-28 ENCOUNTER — Emergency Department (HOSPITAL_COMMUNITY): Payer: Medicare Other

## 2014-10-28 DIAGNOSIS — Z79899 Other long term (current) drug therapy: Secondary | ICD-10-CM | POA: Diagnosis not present

## 2014-10-28 DIAGNOSIS — Z87891 Personal history of nicotine dependence: Secondary | ICD-10-CM | POA: Diagnosis not present

## 2014-10-28 DIAGNOSIS — Z7952 Long term (current) use of systemic steroids: Secondary | ICD-10-CM | POA: Diagnosis not present

## 2014-10-28 DIAGNOSIS — L03811 Cellulitis of head [any part, except face]: Secondary | ICD-10-CM

## 2014-10-28 DIAGNOSIS — E119 Type 2 diabetes mellitus without complications: Secondary | ICD-10-CM | POA: Diagnosis not present

## 2014-10-28 DIAGNOSIS — R221 Localized swelling, mass and lump, neck: Secondary | ICD-10-CM

## 2014-10-28 DIAGNOSIS — H9190 Unspecified hearing loss, unspecified ear: Secondary | ICD-10-CM | POA: Insufficient documentation

## 2014-10-28 DIAGNOSIS — Z87440 Personal history of urinary (tract) infections: Secondary | ICD-10-CM | POA: Insufficient documentation

## 2014-10-28 DIAGNOSIS — L03211 Cellulitis of face: Secondary | ICD-10-CM | POA: Diagnosis not present

## 2014-10-28 DIAGNOSIS — Z930 Tracheostomy status: Secondary | ICD-10-CM | POA: Diagnosis not present

## 2014-10-28 DIAGNOSIS — L02811 Cutaneous abscess of head [any part, except face]: Secondary | ICD-10-CM | POA: Insufficient documentation

## 2014-10-28 DIAGNOSIS — G8929 Other chronic pain: Secondary | ICD-10-CM | POA: Insufficient documentation

## 2014-10-28 DIAGNOSIS — J439 Emphysema, unspecified: Secondary | ICD-10-CM | POA: Insufficient documentation

## 2014-10-28 DIAGNOSIS — M8610 Other acute osteomyelitis, unspecified site: Secondary | ICD-10-CM

## 2014-10-28 DIAGNOSIS — M8660 Other chronic osteomyelitis, unspecified site: Secondary | ICD-10-CM | POA: Insufficient documentation

## 2014-10-28 DIAGNOSIS — F329 Major depressive disorder, single episode, unspecified: Secondary | ICD-10-CM | POA: Diagnosis not present

## 2014-10-28 DIAGNOSIS — I10 Essential (primary) hypertension: Secondary | ICD-10-CM | POA: Insufficient documentation

## 2014-10-28 DIAGNOSIS — K219 Gastro-esophageal reflux disease without esophagitis: Secondary | ICD-10-CM | POA: Insufficient documentation

## 2014-10-28 DIAGNOSIS — R220 Localized swelling, mass and lump, head: Secondary | ICD-10-CM | POA: Diagnosis present

## 2014-10-28 LAB — CBC WITH DIFFERENTIAL/PLATELET
BASOS ABS: 0 10*3/uL (ref 0.0–0.1)
Basophils Relative: 0 %
Eosinophils Absolute: 0.1 10*3/uL (ref 0.0–0.7)
Eosinophils Relative: 2 %
HEMATOCRIT: 49.1 % (ref 39.0–52.0)
HEMOGLOBIN: 17.1 g/dL — AB (ref 13.0–17.0)
LYMPHS PCT: 24 %
Lymphs Abs: 0.9 10*3/uL (ref 0.7–4.0)
MCH: 31.5 pg (ref 26.0–34.0)
MCHC: 34.8 g/dL (ref 30.0–36.0)
MCV: 90.6 fL (ref 78.0–100.0)
Monocytes Absolute: 0.6 10*3/uL (ref 0.1–1.0)
Monocytes Relative: 15 %
NEUTROS ABS: 2.3 10*3/uL (ref 1.7–7.7)
NEUTROS PCT: 59 %
Platelets: 212 10*3/uL (ref 150–400)
RBC: 5.42 MIL/uL (ref 4.22–5.81)
RDW: 12.8 % (ref 11.5–15.5)
WBC: 3.9 10*3/uL — AB (ref 4.0–10.5)

## 2014-10-28 LAB — BASIC METABOLIC PANEL
Anion gap: 9 (ref 5–15)
BUN: 13 mg/dL (ref 6–20)
CO2: 29 mmol/L (ref 22–32)
Calcium: 9.9 mg/dL (ref 8.9–10.3)
Chloride: 93 mmol/L — ABNORMAL LOW (ref 101–111)
Creatinine, Ser: 0.83 mg/dL (ref 0.61–1.24)
Glucose, Bld: 100 mg/dL — ABNORMAL HIGH (ref 65–99)
POTASSIUM: 4.4 mmol/L (ref 3.5–5.1)
SODIUM: 131 mmol/L — AB (ref 135–145)

## 2014-10-28 LAB — SEDIMENTATION RATE: Sed Rate: 54 mm/hr — ABNORMAL HIGH (ref 0–16)

## 2014-10-28 LAB — C-REACTIVE PROTEIN: CRP: 2.3 mg/dL — AB (ref <1.0)

## 2014-10-28 MED ORDER — DEXTROSE 5 % IV SOLN
2.0000 g | Freq: Once | INTRAVENOUS | Status: DC
  Filled 2014-10-28: qty 2

## 2014-10-28 MED ORDER — JEVITY 1.2 CAL/FIBER PO LIQD
237.0000 mL | Freq: Once | ORAL | Status: AC
  Administered 2014-10-28: 237 mL via ORAL
  Filled 2014-10-28: qty 237

## 2014-10-28 MED ORDER — SALINE SPRAY 0.65 % NA SOLN
1.0000 | Freq: Once | NASAL | Status: AC
  Administered 2014-10-28: 1 via NASAL
  Filled 2014-10-28: qty 44

## 2014-10-28 MED ORDER — IOHEXOL 300 MG/ML  SOLN
75.0000 mL | Freq: Once | INTRAMUSCULAR | Status: AC | PRN
  Administered 2014-10-28: 75 mL via INTRAVENOUS

## 2014-10-28 MED ORDER — VANCOMYCIN HCL 10 G IV SOLR
20.0000 mg/kg | Freq: Once | INTRAVENOUS | Status: AC
  Administered 2014-10-28: 1500 mg via INTRAVENOUS
  Filled 2014-10-28: qty 1500

## 2014-10-28 MED ORDER — FENTANYL CITRATE (PF) 100 MCG/2ML IJ SOLN
25.0000 ug | Freq: Once | INTRAMUSCULAR | Status: AC
  Administered 2014-10-28: 25 ug via INTRAVENOUS
  Filled 2014-10-28: qty 2

## 2014-10-28 NOTE — Progress Notes (Signed)
Called to ED by charge RN, pt found on room air, HR101, rr24, spo2 97%.  Pt has 6XL cuffless trach in place.  Pt refused trach collar setup but requested suctioning.  Pt suctioned x3 per his request for small amount of thick tan, blood-tinged secretions.  RT will continue to monitor pt.

## 2014-10-28 NOTE — ED Notes (Signed)
Pt wrote a note for Dr. Laneta Simmers to look at regarding the medications he has taken in the past that have "worked perfectly" for swelling when he visited [place] in the past.  He is asking if these medications can be given for this visit.

## 2014-10-28 NOTE — ED Notes (Signed)
Pt asked that sister and daughter be notified about transfer.  Barbette [phone] [name] [phone]

## 2014-10-28 NOTE — Progress Notes (Signed)
@  CC: routine trach change   HPI  64 yrs old AAM, s/p tracheostomy 14 years ago by Dr Ardis Hughs for osteoradionecrosis of jaw, throat CA (1999), and GERD. He actually had an emergency trach 2 years after neck surgery and radiation. He uses peg normally but supplements PO with ensure at times. Does not use PMV now. Plugs trach hand and speaks. Uses prox 6 extra long.Presents to [facility] [facility] for routine change. Last visit 2/4. Presents today for trach change. I last saw him in May at which time he had more facial swelling than usual and we treated him for infection/mucocytis. During this visit I advised him to make an appointment w/ his ENT at [facility] as I felt he may need imaging to r/o abscess. He did not do this. He states he got better after this and returned today for routine trach change. On presentation today his face is massively swollen c/w baseline. At baseline he has a softball sized mass mostly  Involving the right head/neck. Today the swelling in much more extensive involving the right hed/neck but also the left face and periorbital area. He is insisting that I give him antibiotics "like they do in [place]". But I am concerned that he may now have abscess.  Past Medical History  Diagnosis Date  . Hearing loss   . Change in voice   . Rash   . Trouble swallowing   . Difficulty urinating   . Cervical spondylosis 03/25/2011  . Emphysema 03/25/2011  . GERD (gastroesophageal reflux disease) 03/25/2011  . Chronic pain 03/25/2011  . 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 Xerostomia 03/25/2011  . Ulcer   . UTI (lower urinary tract infection)   . Allergy   . Chronic sinusitis 04/27/2011  . Impaired glucose tolerance 04/27/2011  . Cervical spondylolysis 04/30/2011    severe  . Cancer 1999    throat cancer  . Essential hypertension, benign 12/03/2012  . Type II or unspecified type diabetes mellitus without mention of complication, uncontrolled 04/27/2011  . Depression    Social History   Social  History  . Marital Status: Single    Spouse Name: N/A  . Number of Children: N/A  . Years of Education: 12   Occupational History  . retired    Social History Main Topics  . Smoking status: Former Smoker -- 23 years    Quit date: 11/06/1996  . Smokeless tobacco: Never Used  . Alcohol Use: No  . Drug Use: No  . Sexual Activity: Not on file   Other Topics Concern  . Not on file   Social History Narrative   Pt. Lives in a apartment, 1 story, 2 people live in the home, No pet's. Pt. Does exercise (walk) and yes pt. Has a living will.   Family History  Problem Relation Age of Onset  . Stroke Other   . Diabetes Other   . Cancer Other     lung cancer  . Diabetes Mother   . Heart disease Father     Objective & focused exam   HR 87 rr 16, BP 184/104 sats 98%  HENT: right facial head/neck tumor more swollen than baseline w/ worsening ulceration noted right face. Trach unremarkable. PULM: clear to auscultation Card: rrr ABD: non-tender+ bowel sounds.   Impression/plan Tracheostomy status in setting of head and neck cancer. Trach changed (6 prox XLT) w/out issue.  Plan Keep current trach for now No change until swelling decreased.    Worsening Right Facial  cellulitis/mucocitis +/- now possible abscess.  Last time I advised him that he needs to see his ENT ASAP which he did not do. I am worried that he has abscess now as things look much worse. I think he needs CT imaging to r/o abscess, as well as IV antibiotics. Further more I think he needs to have a local ENT see him as the degree of his head/neck complexity is outside of the scope of trach clinic.  REC: Go to ER I think he needs admit for CT imaging and IV antibiotics.  He would benefit from ENT eval  Could likely be admitted to general medicine floor.   [name] ACNP-BC [place] Pager # [phone] OR # [phone] if no answer

## 2014-10-28 NOTE — ED Notes (Addendum)
Dr. Laneta Simmers was in pt room asking about pt going to to [facility], after Dr. Laneta Simmers left the room, pt started to write on his pad that he has urgent business he cannot miss, plus pt. has senior resources that he can ask to call [facility] tomorrow for an emergency appointment on Monday.  Pt wrote they did it in 2014.Marland Kitchen He seems to think they can do it again.

## 2014-10-28 NOTE — ED Notes (Signed)
Pt sent by [facility] for evaluation.  Clinic staff reports Pt has an elevated BP and needs trach site evaluated.

## 2014-10-28 NOTE — ED Notes (Signed)
Patient transported to CT 

## 2014-10-28 NOTE — ED Provider Notes (Signed)
CSN: [id number]     Arrival date & time 10/28/14  1350 History   First MD Initiated Contact with Patient 10/28/14 1501     Chief Complaint  Patient presents with  . Edema     (Consider location/radiation/quality/duration/timing/severity/associated sxs/prior Treatment) Patient is a 64 y.o. male presenting with general illness. The history is provided by the patient.  Illness Location:  Head and neck Quality:  Swelling Severity:  Moderate Onset quality:  Gradual Timing:  Constant Progression:  Worsening Chronicity:  Recurrent Context:  Recurrent facial infections Relieved by:  Nothing Worsened by:  Nothing Ineffective treatments:  None tried Associated symptoms: rash (with swelling around eyes, face and neck)   Associated symptoms: no cough, no fever and no loss of consciousness   Risk factors:  Trach dependence   Past Medical History  Diagnosis Date  . Hearing loss   . Change in voice   . Rash   . Trouble swallowing   . Difficulty urinating   . Cervical spondylosis 03/25/2011  . Emphysema 03/25/2011  . GERD (gastroesophageal reflux disease) 03/25/2011  . Chronic pain 03/25/2011  . 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 Xerostomia 03/25/2011  . Ulcer   . UTI (lower urinary tract infection)   . Allergy   . Chronic sinusitis 04/27/2011  . Impaired glucose tolerance 04/27/2011  . Cervical spondylolysis 04/30/2011    severe  . Cancer 1999    throat cancer  . Essential hypertension, benign 12/03/2012  . Type II or unspecified type diabetes mellitus without mention of complication, uncontrolled 04/27/2011  . Depression    Past Surgical History  Procedure Laterality Date  . Peg tube placement  11/1999  . Tracheostomy  11/1999  . Gastrostomy tube placement     Family History  Problem Relation Age of Onset  . Stroke Other   . Diabetes Other   . Cancer Other     lung cancer  . Diabetes Mother   . Heart disease Father    Social History  Substance Use Topics  . Smoking  status: Former Smoker -- 23 years    Quit date: 11/06/1996  . Smokeless tobacco: Never Used  . Alcohol Use: No    Review of Systems  Constitutional: Negative for fever.  Respiratory: Negative for cough.   Skin: Positive for rash (with swelling around eyes, face and neck).  Neurological: Negative for loss of consciousness.  All other systems reviewed and are negative.     Allergies  Codeine  Home Medications   Prior to Admission medications   Medication Sig Start Date End Date Taking? Authorizing Provider  ibuprofen (ADVIL,MOTRIN) 400 MG tablet Take 1 tablet (400 mg total) by mouth 3 (three) times daily. As needed for pain 10/13/14  Yes [name], NP  losartan (COZAAR) 50 MG tablet Take 1 tablet (50 mg total) by mouth daily. 08/06/14  Yes [name], NP  naphazoline-pheniramine (NAPHCON-A) 0.025-0.3 % ophthalmic solution Place 2 drops into both eyes 4 (four) times daily as needed for irritation (red eye).   Yes Historical Provider, MD  Nutritional Supplements (FEEDING SUPPLEMENT, JEVITY 1.2 CAL,) LIQD Place 237 mLs into feeding tube See admin instructions. Pt takes 2 cans at breakfast, 2 cans at lunch, 2 cans at dinner and 2 at night   Yes Historical Provider, MD  oxymetazoline (12 HOUR NASAL SPRAY) 0.05 % nasal spray Place 2 sprays into the nose 2 (two) times daily as needed for congestion.    Yes Historical Provider, MD  ranitidine (  ZANTAC) 150 MG tablet take 1 tablet by mouth twice a day 10/13/14  Yes Biagio Borg, MD  triamcinolone cream (KENALOG) 0.1 % Apply 1 application topically 2 (two) times daily. 10/26/14  Yes [name], NP  tamsulosin (FLOMAX) 0.4 MG CAPS capsule Take 1 capsule (0.4 mg total) by mouth daily. Patient not taking: Reported on 10/28/2014 08/06/14   [name], NP  zoster vaccine live, PF, (ZOSTAVAX) 12458 UNT/0.65ML injection Inject 19,400 Units into the skin once. Patient not taking: Reported on 10/08/2014 06/04/14   [name],  NP   BP 186/93 mmHg  Pulse 69  Temp(Src) 98.5 F (36.9 C) (Oral)  Resp 18  SpO2 96% Physical Exam  Constitutional: He is oriented to person, place, and time. He appears well-developed and well-nourished. No distress.  HENT:  Head: Normocephalic and atraumatic.  Severe bilateral facial edema with extension to the neck, chronic wound of right side of cheek, associated periorbital swelling, erythema, and warmth  Eyes: Conjunctivae are normal.  Neck: Neck supple. No tracheal deviation present.  Cardiovascular: Normal rate and regular rhythm.   Pulmonary/Chest: Effort normal and breath sounds normal. No respiratory distress.  Abdominal: Soft. He exhibits no distension.  Neurological: He is alert and oriented to person, place, and time.  Skin: Skin is warm and dry.  Psychiatric: He has a normal mood and affect.    ED Course  Procedures (including critical care time) Labs Review Labs Reviewed  BASIC METABOLIC PANEL - Abnormal; Notable for the following:    Sodium 131 (*)    Chloride 93 (*)    Glucose, Bld 100 (*)    All other components within normal limits  CBC WITH DIFFERENTIAL/PLATELET - Abnormal; Notable for the following:    WBC 3.9 (*)    Hemoglobin 17.1 (*)    All other components within normal limits  SEDIMENTATION RATE - Abnormal; Notable for the following:    Sed Rate 54 (*)    All other components within normal limits  C-REACTIVE PROTEIN - Abnormal; Notable for the following:    CRP 2.3 (*)    All other components within normal limits  CULTURE, BLOOD (ROUTINE X 2)  CULTURE, BLOOD (ROUTINE X 2)    Imaging Review Ct Soft Tissue Neck W Contrast  10/28/2014   CLINICAL DATA:  Neck swelling.  EXAM: CT NECK WITH CONTRAST  TECHNIQUE: Multidetector CT imaging of the neck was performed using the standard protocol following the bolus administration of intravenous contrast.  CONTRAST:  43mL OMNIPAQUE IOHEXOL 300 MG/ML  SOLN  COMPARISON:  CT the face 04/24/2011.  FINDINGS:  Pharynx and larynx: A tracheostomy tube is in place. The subglottic airway is patent.  Extensive laryngeal edema are noted. The hypo pharyngeal airway is obstructed. Extensive edematous changes are present within the oropharynx and at the tongue base.  Salivary glands: The right submandibular gland is surgically absent. The right parotid gland is atrophic. A 10 mm lymph node is seen in the superior aspect of the left parotid gland. 11 mm a nodule is evident near the inferior aspect of the left parotid gland.  Thyroid: Within normal limits following tracheostomy.  Lymph nodes: A right radical neck dissection is noted. A posterior left level 2 lymph node measures 6 mm. A 8 mm left submandibular node is present.  Vascular: The right internal jugular vein has been resected. Atherosclerotic changes are noted at the arch and carotid bifurcations bilaterally without significant stenoses.  Limited intracranial: Within normal limits.  Visualized orbits: The globes and orbits are intact. Marked periorbital edema is worse on the right.  Mastoids and visualized paranasal sinuses: Extensive bilateral ethmoid disease is present. Left greater than right maxillary sinus mucosal thickening is present. A fluid level and near complete opacification is present in the right sphenoid sinus. The left mastoid air cells are clear. The right mastoid air cells appear sclerotic.  Skeleton: Extensive sclerotic changes are present in the right maxilla and zygoma. A malleable plate is noted along the lateral aspect of the left mandible. The mandible is markedly eroded and sclerotic compatible with osteomyelitis. There is gas within the soft tissues adjacent to the mandible on the right with extensive soft tissue swelling over the right side of the face and chin. A a small fluid collection extends from the anterior aspect of the mandible were the malleable plate is elevated. Edema extends under the mandible to the left side of face is well. There  is marked swelling of both the upper and lower lips.  Upper chest: Emphysematous changes are present at the lung apices. Atherosclerotic changes are present at the aortic arch. There is some scarring at the lung apices without a discrete nodule or mass lesion.  IMPRESSION: 1. Extensive edematous changes throughout the lower face and neck. This appears to be an infectious process, likely related to the acute and chronic disc ostia acute and chronic osteomyelitis of the mandible. 2. Extensive edema within the lower face extending bilaterally below the mandible into the lower and upper lips. 3. Right-sided facial soft tissue swelling extends up to the orbit without postseptal infection. 4. Diffuse edematous changes of the oropharynx and hypopharynx with obstruction of the supraglottic airway. A tracheostomy tube maintains the airway below the glottis. 5. Right radical neck dissection. Diffuse edematous changes are superimposed on are likely chronic post radiation and surgical changes of the neck. 6. Sclerosis of the rete right mandible and maxilla compatible with chronic osteomyelitis. 7. Left pre-auricular nodule likely representing a lymph node. Recommend attention to this area on follow-up. 8. Left submandibular lymph node is likely reactive.   Electronically Signed   By: San Morelle M.D.   On: 10/28/2014 18:51   I have personally reviewed and evaluated these images and lab results as part of my medical decision-making.   EKG Interpretation None      MDM   Final diagnoses:  Neck swelling  Acute on chronic osteomyelitis  Tracheostomy dependence  Cellulitis of face    64 year old male with tracheostomy presents with increased swelling over his face and neck. He is status post radiation and surgical resection of head and neck cancer remotely. He had been seen in our tracheostomy clinic previously and recommended to follow up at [facility] for rule out of infection, did not follow-up as  instructed, and return to the tracheostomy clinic today with increased swelling and warmth of his face and neck. They were uncomfortable changing his tracheostomy tube and sent him here for evaluation. CT of the neck demonstrates acute on chronic osteomyelitis of the mandible with extensive edema likely secondary to infection with reactive lymphadenopathy.  Discussed the case with the hospitalist regarding IV antibiotics, admission to the hospital and they recommended transfer to [facility] for continuity of care reasons. [facility] doctor Marchia Bond accepted patient in ED-to-ED transfer to evaluate patient for possible admission vs outpatient management.      Leo Grosser, MD 10/28/14 2034

## 2014-10-28 NOTE — ED Notes (Signed)
He comes to [place] from the trach. Clinic having been brought by one of their staff with concerns of peritracheal swelling.  His lower lip and peritracheal area are swollen and he has good air exchange and he is in no distress.

## 2014-10-28 NOTE — Progress Notes (Signed)
Tracheostomy Procedure Note  SENICA CRALL [id number] 12/14/1950  Pre Procedure Tracheostomy Information  Trach Brand: Shiley Size: 6.0 XLT UP Style: Uncuffed Secured by: Velcro   Procedure:  No trach change done today. Evaluated to be seen in ED. Swelling of neck and face       Post Procedure Tracheostomy Information  Trach Brand:  No trach changed today  Post Procedure Evaluation:  ETCO2 positive color change from yellow to purple : n/a  Vital signs:blood pressure 193/94, pulse 76 respirations 20 and pulse oximetry 98 %  On RA Patients current condition: stable Complications: No apparent complications  none Trach site exam:  Wound care done: Other: Patient did tolerate procedure well.   Education: none  Prescription needs: none    Additional needs: none

## 2014-11-19 ENCOUNTER — Other Ambulatory Visit: Payer: Self-pay

## 2014-11-19 MED ORDER — IBUPROFEN 400 MG PO TABS: 400.0000 mg | ORAL_TABLET | Freq: Three times a day (TID) | ORAL | Status: DC

## 2014-11-19 NOTE — Telephone Encounter (Signed)
[place]

## 2014-12-02 ENCOUNTER — Other Ambulatory Visit: Payer: Self-pay

## 2014-12-02 ENCOUNTER — Ambulatory Visit (INDEPENDENT_AMBULATORY_CARE_PROVIDER_SITE_OTHER): Payer: Medicare Other | Admitting: Internal Medicine

## 2014-12-02 ENCOUNTER — Encounter: Payer: Self-pay | Admitting: Internal Medicine

## 2014-12-02 VITALS — BP 148/96 | HR 79 | Temp 98.5°F | Ht 69.0 in | Wt 166.8 lb

## 2014-12-02 DIAGNOSIS — C329 Malignant neoplasm of larynx, unspecified: Secondary | ICD-10-CM

## 2014-12-02 DIAGNOSIS — R197 Diarrhea, unspecified: Secondary | ICD-10-CM | POA: Diagnosis not present

## 2014-12-02 DIAGNOSIS — E119 Type 2 diabetes mellitus without complications: Secondary | ICD-10-CM | POA: Diagnosis not present

## 2014-12-02 DIAGNOSIS — I10 Essential (primary) hypertension: Secondary | ICD-10-CM | POA: Diagnosis not present

## 2014-12-02 DIAGNOSIS — Z931 Gastrostomy status: Secondary | ICD-10-CM | POA: Diagnosis not present

## 2014-12-02 DIAGNOSIS — Z930 Tracheostomy status: Secondary | ICD-10-CM

## 2014-12-02 DIAGNOSIS — K219 Gastro-esophageal reflux disease without esophagitis: Secondary | ICD-10-CM

## 2014-12-02 MED ORDER — AMBULATORY NON FORMULARY MEDICATION: Status: DC

## 2014-12-02 MED ORDER — LOSARTAN POTASSIUM 100 MG PO TABS: 100.0000 mg | ORAL_TABLET | Freq: Every day | ORAL | Status: DC

## 2014-12-02 MED ORDER — JEVITY 1.2 CAL PO LIQD: 237.0000 mL | ORAL | Status: DC

## 2014-12-02 MED ORDER — DIPHENOXYLATE-ATROPINE 2.5-0.025 MG PO TABS: 1.0000 | ORAL_TABLET | Freq: Four times a day (QID) | ORAL | Status: DC | PRN

## 2014-12-02 NOTE — Progress Notes (Signed)
Patient ID: Nicholas Cobb, male   DOB: October 25, 1950, 64 y.o.   MRN: [id number]    Location:    [facility]   Place of Service:   OFFICE  Chief Complaint  Patient presents with  . Medical Management of Chronic Issues    Medical Management of Chronic Issues. 3 Month follow up for medication management     HPI:  64 yo male seen today for f/u. He has several c/o - needs to change respiratory care companies as he is unhappy with [employer], intermittent diarrhea x 1 yr, needs assistance with cleaning service as he is unable to keep home clean in his current state of health, BP elevated at Dr visits.   HTN - suboptimally controlled on losartan.   DM - diet controlled. Last A1c  6.6% in Feb 2016  Hx throat cancer s/p laryngectomy and XRT - he has trach changed frequently by [facility] but was told he needs to get a specialist in [place]. He reports no new SOB. He does not have an electric voice box.  Emphysema - he does not use nebs. (+) SOB but no chest tightness. He notes increased fatigue  GERD - he is s/p Peg due to throat cancer and trach.  He gets TF Jevity 1.2 cal  Chronic pain/arthritis - stable on ibuprofen  seasonal allergy - stable on nose spray  difficulty voiding - scheduled to see urology on Oct 25th. He is currently on flomax  He is nonverbal but communicates by writing. He has written several pages of concerns today.  Difficult to obtain full HPI and ROS due to nonverbal. He appears very frustrated  Past Medical History  Diagnosis Date  . Hearing loss   . Change in voice   . Rash   . Trouble swallowing   . Difficulty urinating   . Cervical spondylosis 03/25/2011  . Emphysema 03/25/2011  . GERD (gastroesophageal reflux disease) 03/25/2011  . Chronic pain 03/25/2011  . 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 Xerostomia 03/25/2011  . Ulcer   . UTI (lower urinary tract infection)   . Allergy   . Chronic sinusitis 04/27/2011  . Impaired glucose tolerance 04/27/2011  . Cervical  spondylolysis 04/30/2011    severe  . Cancer ([place]) 1999    throat cancer  . Essential hypertension, benign 12/03/2012  . Type II or unspecified type diabetes mellitus without mention of complication, uncontrolled 04/27/2011  . Depression     Past Surgical History  Procedure Laterality Date  . Peg tube placement  11/1999  . Tracheostomy  11/1999  . Gastrostomy tube placement      Patient Care Team: [name], NP as PCP - General (Nurse Practitioner)  Social History   Social History  . Marital Status: Single    Spouse Name: N/A  . Number of Children: N/A  . Years of Education: 12   Occupational History  . retired    Social History Main Topics  . Smoking status: Former Smoker -- 23 years    Quit date: 11/06/1996  . Smokeless tobacco: Never Used  . Alcohol Use: No  . Drug Use: No  . Sexual Activity: Not on file   Other Topics Concern  . Not on file   Social History Narrative   Pt. Lives in a apartment, 1 story, 2 people live in the home, No pet's. Pt. Does exercise (walk) and yes pt. Has a living will.     reports that he quit smoking about 18 years ago.  He has never used smokeless tobacco. He reports that he does not drink alcohol or use illicit drugs.  Allergies  Allergen Reactions  . Codeine Nausea And Vomiting and Rash    Medications: Patient's Medications  New Prescriptions   No medications on file  Previous Medications   IBUPROFEN (ADVIL,MOTRIN) 400 MG TABLET    Take 1 tablet (400 mg total) by mouth 3 (three) times daily. As needed for pain   LOSARTAN (COZAAR) 50 MG TABLET    Take 1 tablet (50 mg total) by mouth daily.   NAPHAZOLINE-PHENIRAMINE (NAPHCON-A) 0.025-0.3 % OPHTHALMIC SOLUTION    Place 2 drops into both eyes 4 (four) times daily as needed for irritation (red eye).   NUTRITIONAL SUPPLEMENTS (FEEDING SUPPLEMENT, JEVITY 1.2 CAL,) LIQD    Place 237 mLs into feeding tube See admin instructions. Pt takes 2 cans at breakfast, 2 cans at lunch, 2  cans at dinner and 2 at night   OXYMETAZOLINE (12 HOUR NASAL SPRAY) 0.05 % NASAL SPRAY    Place 2 sprays into the nose 2 (two) times daily as needed for congestion.    RANITIDINE (ZANTAC) 150 MG TABLET    take 1 tablet by mouth twice a day   TAMSULOSIN (FLOMAX) 0.4 MG CAPS CAPSULE    Take 1 capsule (0.4 mg total) by mouth daily.   TRIAMCINOLONE CREAM (KENALOG) 0.1 %    Apply 1 application topically 2 (two) times daily.   ZOSTER VACCINE LIVE, PF, (ZOSTAVAX) 93903 UNT/0.65ML INJECTION    Inject 19,400 Units into the skin once.  Modified Medications   No medications on file  Discontinued Medications   No medications on file    Review of Systems  Unable to perform ROS: Patient nonverbal    Filed Vitals:   12/02/14 1309  BP: 148/96  Pulse: 79  Temp: 98.5 F (36.9 C)  TempSrc: Oral  Height: _0  (1.753 m)  Weight: 166 lb 12.8 oz (75.66 kg)   Body mass index is 24.62 kg/(m^2).  Physical Exam  Constitutional: He appears well-developed. No distress.  Frail appearing in NAD  HENT:  Mouth/Throat: Oropharynx is clear and moist.  Increased salivation and drooling noted  Eyes: Pupils are equal, round, and reactive to light. No scleral icterus.  Neck: Neck supple. Carotid bruit is not present. No tracheal deviation present. No thyromegaly present.  Trach intact. Significant R>L neck swelling and scar tissue.   Cardiovascular: Normal rate, regular rhythm, normal heart sounds and intact distal pulses.  Exam reveals no gallop and no friction rub.   No murmur heard. no distal LE swelling. No calf TTP  Pulmonary/Chest: Effort normal and breath sounds normal. No stridor. He has no wheezes. He has no rales. He exhibits no tenderness.  Reduced BS at base b/l but no w/r/r/  Abdominal: Soft. Bowel sounds are normal. He exhibits no distension, no abdominal bruit, no pulsatile midline mass and no mass. There is no tenderness. There is no rebound and no guarding.  Peg tube intact. No redness or d/c  at insertion site  Musculoskeletal: He exhibits edema and tenderness.  Lymphadenopathy:    He has no cervical adenopathy.  Neurological: He is alert.  Skin: Skin is warm and dry. No rash noted.  Psychiatric: His affect is angry. He is aggressive. He is noncommunicative.     Labs reviewed: Admission on 10/28/2014, Discharged on 10/28/2014  Component Date Value Ref Range Status  . Sodium 10/28/2014 131* 135 - 145 mmol/L Final  . Potassium 10/28/2014  4.4  3.5 - 5.1 mmol/L Final  . Chloride 10/28/2014 93* 101 - 111 mmol/L Final  . CO2 10/28/2014 29  22 - 32 mmol/L Final  . Glucose, Bld 10/28/2014 100* 65 - 99 mg/dL Final  . BUN 10/28/2014 13  6 - 20 mg/dL Final  . Creatinine, Ser 10/28/2014 0.83  0.61 - 1.24 mg/dL Final  . Calcium 10/28/2014 9.9  8.9 - 10.3 mg/dL Final  . GFR calc non Af Amer 10/28/2014 >60  >60 mL/min Final  . GFR calc Af Amer 10/28/2014 >60  >60 mL/min Final   Comment: (NOTE) The eGFR has been calculated using the CKD EPI equation. This calculation has not been validated in all clinical situations. eGFR's persistently <60 mL/min signify possible Chronic Kidney Disease.   . Anion gap 10/28/2014 9  5 - 15 Final  . WBC 10/28/2014 3.9* 4.0 - 10.5 K/uL Final  . RBC 10/28/2014 5.42  4.22 - 5.81 MIL/uL Final  . Hemoglobin 10/28/2014 17.1* 13.0 - 17.0 g/dL Final  . HCT 10/28/2014 49.1  39.0 - 52.0 % Final  . MCV 10/28/2014 90.6  78.0 - 100.0 fL Final  . MCH 10/28/2014 31.5  26.0 - 34.0 pg Final  . MCHC 10/28/2014 34.8  30.0 - 36.0 g/dL Final  . RDW 10/28/2014 12.8  11.5 - 15.5 % Final  . Platelets 10/28/2014 212  150 - 400 K/uL Final  . Neutrophils Relative % 10/28/2014 59   Final  . Neutro Abs 10/28/2014 2.3  1.7 - 7.7 K/uL Final  . Lymphocytes Relative 10/28/2014 24   Final  . Lymphs Abs 10/28/2014 0.9  0.7 - 4.0 K/uL Final  . Monocytes Relative 10/28/2014 15   Final  . Monocytes Absolute 10/28/2014 0.6  0.1 - 1.0 K/uL Final  . Eosinophils Relative 10/28/2014 2    Final  . Eosinophils Absolute 10/28/2014 0.1  0.0 - 0.7 K/uL Final  . Basophils Relative 10/28/2014 0   Final  . Basophils Absolute 10/28/2014 0.0  0.0 - 0.1 K/uL Final  . Sed Rate 10/28/2014 54* 0 - 16 mm/hr Final  . CRP 10/28/2014 2.3* <1.0 mg/dL Final   Performed at [facility]  Office Visit on 10/08/2014  Component Date Value Ref Range Status  . Prostate Specific Ag, Serum 10/08/2014 0.8  0.0 - 4.0 ng/mL Final   Comment: Roche ECLIA methodology. According to the American Urological Association, Serum PSA should decrease and remain at undetectable levels after radical prostatectomy. The AUA defines biochemical recurrence as an initial PSA value 0.2 ng/mL or greater followed by a subsequent confirmatory PSA value 0.2 ng/mL or greater. Values obtained with different assay methods or kits cannot be used interchangeably. Results cannot be interpreted as absolute evidence of the presence or absence of malignant disease.   . Color, UA 10/08/2014 yellow   Final  . Clarity, UA 10/08/2014 clear   Final  . Glucose, UA 10/08/2014 Negative   Final  . Bilirubin, UA 10/08/2014 Negative   Final  . Ketones, UA 10/08/2014 Negative   Final  . Spec Grav, UA 10/08/2014 1.010   Final  . Blood, UA 10/08/2014 Negative   Final  . pH, UA 10/08/2014 5.0   Final  . Urobilinogen, UA 10/08/2014 0.2   Final  . Nitrite, UA 10/08/2014 Negative   Final  . Leukocytes, UA 10/08/2014 Negative  Negative Final    No results found.   Assessment/Plan   ICD-9-CM ICD-10-CM   1. Essential hypertension, benign - uncontrolled 401.1 I10 losartan (  COZAAR) 100 MG tablet  2. Laryngeal cancer [facility]) s/p resection and XRT; he has a significant amt of lymphedema and scar tissue 161.9 C32.9 Ambulatory referral to ENT  3. Tracheostomy in place [facility]) due to #2 V44.0 Z93.0 Ambulatory referral to ENT  4. Diarrhea, unspecified type 787.91 R19.7 diphenoxylate-atropine (LOMOTIL) 2.5-0.025 MG tablet   probably related to  Tube feeds vs unknown etiology  5. Type 2 diabetes mellitus without complication, without long-term current use of insulin (HCC) - stable at last check 250.00 E11.9   6. S/P percutaneous endoscopic gastrostomy (PEG) tube placement (HCC) - due to #2 and #3 V44.1 Z93.1   7. Gastroesophageal reflux disease without esophagitis - stable 530.81 K21.9     He declined flu vaccine stating his insurance company would not cover cost  Start lomotil to use as needed for loose stool/diarrhea. Stop med if nausea/vomitin, abdominal pain occurs  He may benefit from nutrition eval vs GI eval to tailor TF for optimum nutritional benefit  Increase losartan 16m daily for blood pressure  Continue other medications as ordered  Will call with new supply company name. He will need both trach supplies and TF supplies  He refused labs today stating he had labs 4 times already this year  Need to get ENT records from previous visits  Follow up with JJanett Cobb 3 mos for routine visit  Eshika Reckart S. CPerlie Gold [facility] [address] [phone] (Monday-Friday 8 AM - 5 PM) [phone] 5 PM and follow prompts

## 2014-12-02 NOTE — Patient Instructions (Signed)
Start lomotil to use as needed for loose stool/diarrhea. Stop med if nausea/vomitin, abdominal pain occurs  Increase losartan 100mg  daily for blood pressure  Continue other medications as ordered  Will call with new supply company name  Follow up with Janett Billow in 3 mos for routine visit

## 2014-12-03 ENCOUNTER — Encounter: Payer: Self-pay | Admitting: Internal Medicine

## 2014-12-21 ENCOUNTER — Other Ambulatory Visit: Payer: Self-pay | Admitting: Internal Medicine

## 2015-01-05 ENCOUNTER — Other Ambulatory Visit: Payer: Self-pay | Admitting: Internal Medicine

## 2015-01-06 ENCOUNTER — Encounter: Payer: Self-pay | Admitting: Internal Medicine

## 2015-01-06 ENCOUNTER — Ambulatory Visit (INDEPENDENT_AMBULATORY_CARE_PROVIDER_SITE_OTHER): Payer: Medicare Other | Admitting: Internal Medicine

## 2015-01-06 VITALS — BP 142/90 | HR 76 | Temp 98.6°F | Resp 16 | Ht 69.0 in | Wt 168.0 lb

## 2015-01-06 DIAGNOSIS — Z930 Tracheostomy status: Secondary | ICD-10-CM | POA: Diagnosis not present

## 2015-01-06 DIAGNOSIS — I10 Essential (primary) hypertension: Secondary | ICD-10-CM | POA: Diagnosis not present

## 2015-01-06 DIAGNOSIS — Z931 Gastrostomy status: Secondary | ICD-10-CM

## 2015-01-06 DIAGNOSIS — R600 Localized edema: Secondary | ICD-10-CM | POA: Diagnosis not present

## 2015-01-06 DIAGNOSIS — R197 Diarrhea, unspecified: Secondary | ICD-10-CM

## 2015-01-06 DIAGNOSIS — C329 Malignant neoplasm of larynx, unspecified: Secondary | ICD-10-CM

## 2015-01-06 MED ORDER — CHOLESTYRAMINE LIGHT 4 G PO PACK: 4.0000 g | PACK | Freq: Three times a day (TID) | ORAL | Status: DC

## 2015-01-06 NOTE — Progress Notes (Signed)
Patient ID: Nicholas Cobb, male   DOB: Jan 20, 1951, 64 y.o.   MRN: [id number]    Location:    P[facility]   Place of Service:  OFFICE   Chief Complaint  Patient presents with  . Acute Visit    Patient needs alternative to medication written in October 2016(Lomotil). Patient is requesting a letter for his caseworker stating his medical conditions so that he may be eligible for assistance in the home.   . Immunizations    Refused Flu Vaccine, patient states last one made him too sick    HPI:  64 yo male seen today to discuss obtaining a letter for his case worker so that he can have personal care services at [facility] to help with light cleaning and make phone calls for him. He has not seen ENT due to untimely death of previous ENT. He does not have an electronic voice box and communicates by writing. He needs ENT referral. He saw one one in [place] several yrs ago but prefers to go to a different city.   He also states oxybutynin Rx by urology caused ADRs similar to codeine and he stopped taking it. He is taking mybetriq  Lomotil is too expensive ($34/month) and he requests a different med to help with diarrhea. He does not recall ever taking questran  HTN - takes losartan daily as Rx  Past Medical History  Diagnosis Date  . Hearing loss   . Change in voice   . Rash   . Trouble swallowing   . Difficulty urinating   . Cervical spondylosis 03/25/2011  . Emphysema 03/25/2011  . GERD (gastroesophageal reflux disease) 03/25/2011  . Chronic pain 03/25/2011  . 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 Xerostomia 03/25/2011  . Ulcer   . UTI (lower urinary tract infection)   . Allergy   . Chronic sinusitis 04/27/2011  . Impaired glucose tolerance 04/27/2011  . Cervical spondylolysis 04/30/2011    severe  . Cancer ([place]) 1999    throat cancer  . Essential hypertension, benign 12/03/2012  . Type II or unspecified type diabetes mellitus without mention of complication, uncontrolled 04/27/2011  . Depression       Past Surgical History  Procedure Laterality Date  . Peg tube placement  11/1999  . Tracheostomy  11/1999  . Gastrostomy tube placement      Patient Care Team: [name], NP as PCP - General (Nurse Practitioner)  Social History   Social History  . Marital Status: Single    Spouse Name: N/A  . Number of Children: N/A  . Years of Education: 12   Occupational History  . retired    Social History Main Topics  . Smoking status: Former Smoker -- 23 years    Quit date: 11/06/1996  . Smokeless tobacco: Never Used  . Alcohol Use: No  . Drug Use: No  . Sexual Activity: Not on file   Other Topics Concern  . Not on file   Social History Narrative   Pt. Lives in a apartment, 1 story, 2 people live in the home, No pet's. Pt. Does exercise (walk) and yes pt. Has a living will.     reports that he quit smoking about 18 years ago. He has never used smokeless tobacco. He reports that he does not drink alcohol or use illicit drugs.  Allergies  Allergen Reactions  . Codeine Nausea And Vomiting and Rash    Medications: Patient's Medications  New Prescriptions   No medications on file  Previous Medications   AMBULATORY NON FORMULARY MEDICATION    Trach supplies  Dx:C32.9 ,Z93.0   AMBULATORY NON FORMULARY MEDICATION    Feeding tube ( peg tube ) supplies   DIPHENOXYLATE-ATROPINE (LOMOTIL) 2.5-0.025 MG TABLET    Take 1 tablet by mouth 4 (four) times daily as needed for diarrhea or loose stools.   IBUPROFEN (ADVIL,MOTRIN) 400 MG TABLET    take 1 tablet by mouth three times a day if needed for pain   LOSARTAN (COZAAR) 100 MG TABLET    Take 1 tablet (100 mg total) by mouth daily.   NAPHAZOLINE-PHENIRAMINE (NAPHCON-A) 0.025-0.3 % OPHTHALMIC SOLUTION    Place 2 drops into both eyes 4 (four) times daily as needed for irritation (red eye).   NUTRITIONAL SUPPLEMENTS (FEEDING SUPPLEMENT, JEVITY 1.2 CAL,) LIQD    Place 237 mLs into feeding tube See admin instructions. Pt takes 2  cans at breakfast, 2 cans at lunch, 2 cans at dinner and 2 at night   OXYMETAZOLINE (12 HOUR NASAL SPRAY) 0.05 % NASAL SPRAY    Place 2 sprays into the nose 2 (two) times daily as needed for congestion.    RANITIDINE (ZANTAC) 150 MG TABLET    take 1 tablet by mouth twice a day   TAMSULOSIN (FLOMAX) 0.4 MG CAPS CAPSULE    Take 1 capsule (0.4 mg total) by mouth daily.   TRIAMCINOLONE CREAM (KENALOG) 0.1 %    Apply 1 application topically 2 (two) times daily.   ZOSTER VACCINE LIVE, PF, (ZOSTAVAX) 74944 UNT/0.65ML INJECTION    Inject 19,400 Units into the skin once.  Modified Medications   No medications on file  Discontinued Medications   No medications on file    Review of Systems  Unable to perform ROS: Patient nonverbal    Filed Vitals:   01/06/15 1517  BP: 142/90  Pulse: 76  Temp: 98.6 F (37 C)  TempSrc: Oral  Resp: 16  Height: $Remove'5\' 9"'uaIxuLU$  (1.753 m)  Weight: 168 lb (76.204 kg)   Body mass index is 24.8 kg/(m^2).  Physical Exam  Constitutional: He appears well-developed.  Frail appearing in NAD  HENT:  R>L facial swelling extending into neck  Neck:  Trach intact  Musculoskeletal:  Gait unsteady  Neurological: He is alert.  Skin: Skin is warm and dry.  Psychiatric: He has a normal mood and affect. His behavior is normal.     Labs reviewed: Admission on 10/28/2014, Discharged on 10/28/2014  Component Date Value Ref Range Status  . Sodium 10/28/2014 131* 135 - 145 mmol/L Final  . Potassium 10/28/2014 4.4  3.5 - 5.1 mmol/L Final  . Chloride 10/28/2014 93* 101 - 111 mmol/L Final  . CO2 10/28/2014 29  22 - 32 mmol/L Final  . Glucose, Bld 10/28/2014 100* 65 - 99 mg/dL Final  . BUN 10/28/2014 13  6 - 20 mg/dL Final  . Creatinine, Ser 10/28/2014 0.83  0.61 - 1.24 mg/dL Final  . Calcium 10/28/2014 9.9  8.9 - 10.3 mg/dL Final  . GFR calc non Af Amer 10/28/2014 >60  >60 mL/min Final  . GFR calc Af Amer 10/28/2014 >60  >60 mL/min Final   Comment: (NOTE) The eGFR has been  calculated using the CKD EPI equation. This calculation has not been validated in all clinical situations. eGFR's persistently <60 mL/min signify possible Chronic Kidney Disease.   . Anion gap 10/28/2014 9  5 - 15 Final  . WBC 10/28/2014 3.9* 4.0 - 10.5 K/uL Final  . RBC 10/28/2014 5.42  4.22 - 5.81 MIL/uL Final  . Hemoglobin 10/28/2014 17.1* 13.0 - 17.0 g/dL Final  . HCT 10/28/2014 49.1  39.0 - 52.0 % Final  . MCV 10/28/2014 90.6  78.0 - 100.0 fL Final  . MCH 10/28/2014 31.5  26.0 - 34.0 pg Final  . MCHC 10/28/2014 34.8  30.0 - 36.0 g/dL Final  . RDW 10/28/2014 12.8  11.5 - 15.5 % Final  . Platelets 10/28/2014 212  150 - 400 K/uL Final  . Neutrophils Relative % 10/28/2014 59   Final  . Neutro Abs 10/28/2014 2.3  1.7 - 7.7 K/uL Final  . Lymphocytes Relative 10/28/2014 24   Final  . Lymphs Abs 10/28/2014 0.9  0.7 - 4.0 K/uL Final  . Monocytes Relative 10/28/2014 15   Final  . Monocytes Absolute 10/28/2014 0.6  0.1 - 1.0 K/uL Final  . Eosinophils Relative 10/28/2014 2   Final  . Eosinophils Absolute 10/28/2014 0.1  0.0 - 0.7 K/uL Final  . Basophils Relative 10/28/2014 0   Final  . Basophils Absolute 10/28/2014 0.0  0.0 - 0.1 K/uL Final  . Sed Rate 10/28/2014 54* 0 - 16 mm/hr Final  . CRP 10/28/2014 2.3* <1.0 mg/dL Final   Performed at [facility]  Office Visit on 10/08/2014  Component Date Value Ref Range Status  . Prostate Specific Ag, Serum 10/08/2014 0.8  0.0 - 4.0 ng/mL Final   Comment: Roche ECLIA methodology. According to the American Urological Association, Serum PSA should decrease and remain at undetectable levels after radical prostatectomy. The AUA defines biochemical recurrence as an initial PSA value 0.2 ng/mL or greater followed by a subsequent confirmatory PSA value 0.2 ng/mL or greater. Values obtained with different assay methods or kits cannot be used interchangeably. Results cannot be interpreted as absolute evidence of the presence or absence of  malignant disease.   . Color, UA 10/08/2014 yellow   Final  . Clarity, UA 10/08/2014 clear   Final  . Glucose, UA 10/08/2014 Negative   Final  . Bilirubin, UA 10/08/2014 Negative   Final  . Ketones, UA 10/08/2014 Negative   Final  . Spec Grav, UA 10/08/2014 1.010   Final  . Blood, UA 10/08/2014 Negative   Final  . pH, UA 10/08/2014 5.0   Final  . Urobilinogen, UA 10/08/2014 0.2   Final  . Nitrite, UA 10/08/2014 Negative   Final  . Leukocytes, UA 10/08/2014 Negative  Negative Final    No results found.   Assessment/Plan   ICD-9-CM ICD-10-CM   1. Tracheostomy in place [facility]) V44.0 Z93.0   2. Laryngeal cancer [facility]) s/p resection and XRT 161.9 C32.9   3. Diarrhea, unspecified type - unchanged 787.91 R19.7   4. Essential hypertension, benign - stable 401.1 I10   5.      Localized edema due to XRT - unchanged 6       S/p PEG tube due to unable to swallow  Contact our office with name of preferred ENT specialist after you go to the [place] clinic in 2 weeks  Take questran with meals as directed. May have bloating with med  Continue other medications as ordered  Letter to case worker written today  Follow up as scheduled with Gaspar Cola S. Perlie Gold  [facility] [address] [place], [place] [address] [phone] Cell (Monday-Friday 8 AM - 5 PM) [phone] After 5 PM and follow prompts

## 2015-01-06 NOTE — Patient Instructions (Addendum)
Contact our office with name of preferred ENT specialist after you go to the [place] clinic in 2 weeks  Take questran with meals as directed. May have bloating with med  Continue other medications as ordered  Letter to case worker written today  Follow up as scheduled with Janett Billow

## 2015-01-13 ENCOUNTER — Ambulatory Visit (HOSPITAL_COMMUNITY)
Admission: RE | Admit: 2015-01-13 | Discharge: 2015-01-13 | Disposition: A | Discharge status: Home / Self Care | Payer: Medicare Other | Source: Ambulatory Visit | Attending: Acute Care | Admitting: Acute Care

## 2015-01-13 DIAGNOSIS — Z430 Encounter for attention to tracheostomy: Secondary | ICD-10-CM | POA: Diagnosis not present

## 2015-01-13 DIAGNOSIS — C760 Malignant neoplasm of head, face and neck: Secondary | ICD-10-CM | POA: Diagnosis not present

## 2015-01-13 DIAGNOSIS — Z931 Gastrostomy status: Secondary | ICD-10-CM | POA: Diagnosis not present

## 2015-01-13 DIAGNOSIS — M272 Inflammatory conditions of jaws: Secondary | ICD-10-CM | POA: Diagnosis not present

## 2015-01-13 NOTE — Progress Notes (Addendum)
Tracheostomy Procedure Note  Nicholas Cobb [id number] [birth year]  Pre Procedure Tracheostomy Information  Trach Brand: Shiley Size: 6.0 XLT U P Style: Uncuffed Secured by: Velcro   Procedure: Trach cleaning and trach change    Post Procedure Tracheostomy Information  Trach Brand: Shiley Size: 6.0  XLT U P Style: Uncuffed Secured by: Velcro   Post Procedure Evaluation:  ETCO2 positive color change from yellow to purple : Yes.   Vital signs:blood pressure 176/87, pulse 77, respirations 22 and pulse oximetry 98%  on RA Patients current condition: stable Complications: No apparent complications Trach site exam: dry  Wound care done: dry Patient did tolerate procedure well.  Vitals post trach change  BP 188/91  RR 20 HR 76 O2 sats  96% on RA Vitals prior to discharge BP  173/75    HR79     RR 20     O2 sats 96% On  RA  Education: None needed at this time  Prescription needs: none  Additional needs: none

## 2015-01-13 NOTE — Progress Notes (Signed)
CC: Trach change   HPI Nicholas Cobb returns today for routine trach change. The last time I saw Nicholas Cobb he presented w/ worsening facial swelling and was unable to phonate anymore. I referred him to the ER for imaging (which demonstrated osteomyelitis) and it was recommended that he be admitted for IV antibiotics. As he was "followed in [place]" he was sent to [facility] to be admitted. Unfortunately it appears as though he was not admitted and was treated w/ oral abx and sent home w/ instructions to follow here. PT presents w/ multiple complaints which have been presented essentially every visit.   ROS HENT: facial swelling unchanged. Takes food via PEG. No longer able to phonate. Asking about using PMV and wants to see an "ENT who can help him". Pulm: no cough or SOB  Vital signs:blood pressure 176/87, pulse 77, respirations 22 and pulse oximetry 98% on RA  General: 64 year old male w/ h/o head and neck cancer, chronic osteo involving the mandible. # 6 cuffless trach. HENT: # 6 cuffless trach. No discharge. Pulm: clear, no accessory muscle use. Card: rrr. Abd: non-tender. + bowel sounds.   Impression/plan  Tracheostomy status s/p head and neck cancer Chronic mandibular osteomyelitis  Plan Can return in 3 months for routine trach change.  Wants referral to local ENT --> i am concerned that his level of complexity may be more than we are prepared to provide at the trach clinic w/out support.    [name] ACNP-BC [place] Pager # [phone] OR # [phone] if no answer

## 2015-01-18 ENCOUNTER — Other Ambulatory Visit: Payer: Self-pay | Admitting: Nurse Practitioner

## 2015-01-18 DIAGNOSIS — Z930 Tracheostomy status: Secondary | ICD-10-CM

## 2015-01-19 ENCOUNTER — Telehealth: Payer: Self-pay | Admitting: Nurse Practitioner

## 2015-01-19 NOTE — Telephone Encounter (Signed)
Called [facility] to follow up patient was referred to them in October.. Patient was scheduled with Dr. Redmond Baseman 12/10/14   I spoke with Nicholas Cobb at [facility] [place] she verified patient was a No Call / No Show for his appointment 12/10/14 with Dr. Redmond Baseman. Per Nicholas Cobb at [facility] ENT patient needs to call their office to reschedule appointment.    I called patients daughter Nicholas Cobb and left a message to please call me back to explain this to them.Marland Kitchen

## 2015-01-22 ENCOUNTER — Other Ambulatory Visit: Payer: Self-pay | Admitting: Internal Medicine

## 2015-01-22 ENCOUNTER — Telehealth: Payer: Self-pay | Admitting: *Deleted

## 2015-01-22 ENCOUNTER — Other Ambulatory Visit: Payer: Self-pay | Admitting: *Deleted

## 2015-01-22 MED ORDER — RANITIDINE HCL 150 MG PO TABS: 150.0000 mg | ORAL_TABLET | Freq: Two times a day (BID) | ORAL | Status: DC

## 2015-01-22 NOTE — Telephone Encounter (Signed)
Nicholas Cobb with [employer] called and left message on voicemail stating patient was seen at the [facility] and requesting a Specialist.  Tried to return call, LMOM to return my call.

## 2015-01-22 NOTE — Telephone Encounter (Signed)
[facility] 

## 2015-01-28 ENCOUNTER — Telehealth: Payer: Self-pay | Admitting: Internal Medicine

## 2015-01-28 NOTE — Telephone Encounter (Signed)
noted 

## 2015-01-28 NOTE — Telephone Encounter (Signed)
FYI - Called [facility] to follow up patient was referred to them in October.. Patient was scheduled with Dr. Redmond Baseman 12/10/14  I spoke with Janett Billow at [facility] [place] she verified patient was a no call no show for his appointment 12/10/14 with Dr. Redmond Baseman. Per Janett Billow patient needs to call their office to reschedule appointment.   I have called the patient/Daughter and left several messages to return my calls to explain this but they have yet to return my calls  Patient is scheduled to see you tomorrow 01/29/15 @ 8:15

## 2015-01-29 ENCOUNTER — Ambulatory Visit (INDEPENDENT_AMBULATORY_CARE_PROVIDER_SITE_OTHER): Payer: Medicare Other | Admitting: Internal Medicine

## 2015-01-29 ENCOUNTER — Encounter: Payer: Self-pay | Admitting: Internal Medicine

## 2015-01-29 VITALS — BP 148/92 | HR 76 | Temp 98.7°F | Resp 22 | Ht 69.0 in | Wt 168.6 lb

## 2015-01-29 DIAGNOSIS — I10 Essential (primary) hypertension: Secondary | ICD-10-CM

## 2015-01-29 DIAGNOSIS — Z931 Gastrostomy status: Secondary | ICD-10-CM | POA: Diagnosis not present

## 2015-01-29 DIAGNOSIS — Z930 Tracheostomy status: Secondary | ICD-10-CM

## 2015-01-29 DIAGNOSIS — R600 Localized edema: Secondary | ICD-10-CM | POA: Diagnosis not present

## 2015-01-29 DIAGNOSIS — C329 Malignant neoplasm of larynx, unspecified: Secondary | ICD-10-CM | POA: Diagnosis not present

## 2015-01-29 NOTE — Progress Notes (Signed)
Patient ID: Nicholas Cobb, male   DOB: [birth year], 64 y.o.   MRN: [id number]    Location:    [facility]   Place of Service:   OFFICE  Chief Complaint  Patient presents with  . Medical Management of Chronic Issues    Follow-up from trach clinic, labs printed  . OTHER    note in chart    HPI:  64 yo male seen today for f/u. He was unaware of October ENT appt with Dr Redmond Baseman. He would like to have it rescheduled. He also would like to change trach/FT supply company as heis unhappy with [employer] services. Social worker still working on Psychologist, sport and exercise for him  Past Medical History  Diagnosis Date  . Hearing loss   . Change in voice   . Rash   . Trouble swallowing   . Difficulty urinating   . Cervical spondylosis 03/25/2011  . Emphysema 03/25/2011  . GERD (gastroesophageal reflux disease) 03/25/2011  . Chronic pain 03/25/2011  . 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 Xerostomia 03/25/2011  . Ulcer   . UTI (lower urinary tract infection)   . Allergy   . Chronic sinusitis 04/27/2011  . Impaired glucose tolerance 04/27/2011  . Cervical spondylolysis 04/30/2011    severe  . Cancer ([place]) 1999    throat cancer  . Essential hypertension, benign 12/03/2012  . Type II or unspecified type diabetes mellitus without mention of complication, uncontrolled 04/27/2011  . Depression     Past Surgical History  Procedure Laterality Date  . Peg tube placement  11/1999  . Tracheostomy  11/1999  . Gastrostomy tube placement      Patient Care Team: [name], NP as PCP - General (Nurse Practitioner)  Social History   Social History  . Marital Status: Single    Spouse Name: N/A  . Number of Children: N/A  . Years of Education: 12   Occupational History  . retired    Social History Main Topics  . Smoking status: Former Smoker -- 23 years    Quit date: 11/06/1996  . Smokeless tobacco: Never Used  . Alcohol Use: No  . Drug Use: No  . Sexual Activity: Not on file   Other Topics Concern    . Not on file   Social History Narrative   Pt. Lives in a apartment, 1 story, 2 people live in the home, No pet's. Pt. Does exercise (walk) and yes pt. Has a living will.     reports that he quit smoking about 18 years ago. He has never used smokeless tobacco. He reports that he does not drink alcohol or use illicit drugs.  Allergies  Allergen Reactions  . Oxybutynin Chloride Nausea And Vomiting  . Codeine Nausea And Vomiting and Rash    Medications: Patient's Medications  New Prescriptions   No medications on file  Previous Medications   AMBULATORY NON FORMULARY MEDICATION    Trach supplies  Dx:C32.9 ,Z93.0   AMBULATORY NON FORMULARY MEDICATION    Feeding tube ( peg tube ) supplies   CHOLESTYRAMINE LIGHT (PREVALITE) 4 G PACKET    Take 1 packet (4 g total) by mouth 3 (three) times daily.   DIPHENOXYLATE-ATROPINE (LOMOTIL) 2.5-0.025 MG TABLET    Take 1 tablet by mouth 4 (four) times daily as needed for diarrhea or loose stools.   IBUPROFEN (ADVIL,MOTRIN) 400 MG TABLET    take 1 tablet by mouth three times a day if needed for pain   LOSARTAN (COZAAR) 100 MG  TABLET    Take 1 tablet (100 mg total) by mouth daily.   NAPHAZOLINE-PHENIRAMINE (NAPHCON-A) 0.025-0.3 % OPHTHALMIC SOLUTION    Place 2 drops into both eyes 4 (four) times daily as needed for irritation (red eye).   NUTRITIONAL SUPPLEMENTS (FEEDING SUPPLEMENT, JEVITY 1.2 CAL,) LIQD    Place 237 mLs into feeding tube See admin instructions. Pt takes 2 cans at breakfast, 2 cans at lunch, 2 cans at dinner and 2 at night   OXYMETAZOLINE (12 HOUR NASAL SPRAY) 0.05 % NASAL SPRAY    Place 2 sprays into the nose 2 (two) times daily as needed for congestion.    RANITIDINE (ZANTAC) 150 MG TABLET    Take 1 tablet (150 mg total) by mouth 2 (two) times daily.   TAMSULOSIN (FLOMAX) 0.4 MG CAPS CAPSULE    Take 1 capsule (0.4 mg total) by mouth daily.   TRIAMCINOLONE CREAM (KENALOG) 0.1 %    Apply 1 application topically 2 (two) times daily.    ZOSTER VACCINE LIVE, PF, (ZOSTAVAX) 16109 UNT/0.65ML INJECTION    Inject 19,400 Units into the skin once.  Modified Medications   No medications on file  Discontinued Medications   No medications on file    Review of Systems  Unable to perform ROS: Other    Filed Vitals:   01/29/15 0814  BP: 148/92  Pulse: 76  Temp: 98.7 F (37.1 C)  TempSrc: Oral  Resp: 22  Height: '5\' 9"'$  (1.753 m)  Weight: 168 lb 9.6 oz (76.476 kg)  SpO2: 95%   Body mass index is 24.89 kg/(m^2).  Physical Exam  Constitutional: He appears well-developed. No distress.  Frail appearing in NAD  Neurological: He is alert.  Skin: Skin is warm and dry. No rash noted.  Psychiatric: His behavior is normal. His affect is angry.     Labs reviewed: No visits with results within 3 Month(s) from this visit. Latest known visit with results is:  Admission on 10/28/2014, Discharged on 10/28/2014  Component Date Value Ref Range Status  . Sodium 10/28/2014 131* 135 - 145 mmol/L Final  . Potassium 10/28/2014 4.4  3.5 - 5.1 mmol/L Final  . Chloride 10/28/2014 93* 101 - 111 mmol/L Final  . CO2 10/28/2014 29  22 - 32 mmol/L Final  . Glucose, Bld 10/28/2014 100* 65 - 99 mg/dL Final  . BUN 10/28/2014 13  6 - 20 mg/dL Final  . Creatinine, Ser 10/28/2014 0.83  0.61 - 1.24 mg/dL Final  . Calcium 10/28/2014 9.9  8.9 - 10.3 mg/dL Final  . GFR calc non Af Amer 10/28/2014 >60  >60 mL/min Final  . GFR calc Af Amer 10/28/2014 >60  >60 mL/min Final   Comment: (NOTE) The eGFR has been calculated using the CKD EPI equation. This calculation has not been validated in all clinical situations. eGFR's persistently <60 mL/min signify possible Chronic Kidney Disease.   . Anion gap 10/28/2014 9  5 - 15 Final  . WBC 10/28/2014 3.9* 4.0 - 10.5 K/uL Final  . RBC 10/28/2014 5.42  4.22 - 5.81 MIL/uL Final  . Hemoglobin 10/28/2014 17.1* 13.0 - 17.0 g/dL Final  . HCT 10/28/2014 49.1  39.0 - 52.0 % Final  . MCV 10/28/2014 90.6  78.0 -  100.0 fL Final  . MCH 10/28/2014 31.5  26.0 - 34.0 pg Final  . MCHC 10/28/2014 34.8  30.0 - 36.0 g/dL Final  . RDW 10/28/2014 12.8  11.5 - 15.5 % Final  . Platelets 10/28/2014 212  150 -  400 K/uL Final  . Neutrophils Relative % 10/28/2014 59   Final  . Neutro Abs 10/28/2014 2.3  1.7 - 7.7 K/uL Final  . Lymphocytes Relative 10/28/2014 24   Final  . Lymphs Abs 10/28/2014 0.9  0.7 - 4.0 K/uL Final  . Monocytes Relative 10/28/2014 15   Final  . Monocytes Absolute 10/28/2014 0.6  0.1 - 1.0 K/uL Final  . Eosinophils Relative 10/28/2014 2   Final  . Eosinophils Absolute 10/28/2014 0.1  0.0 - 0.7 K/uL Final  . Basophils Relative 10/28/2014 0   Final  . Basophils Absolute 10/28/2014 0.0  0.0 - 0.1 K/uL Final  . Sed Rate 10/28/2014 54* 0 - 16 mm/hr Final  . CRP 10/28/2014 2.3* <1.0 mg/dL Final   Performed at [facility]    No results found.   Assessment/Plan   ICD-9-CM ICD-10-CM   1. Localized edema 782.3 R60.0   2. Tracheostomy in place [facility]) V44.0 Z93.0   3. Laryngeal cancer (HCC) 161.9 C32.9   4. S/P percutaneous endoscopic gastrostomy (PEG) tube placement (HCC) V44.1 Z93.1   5. Essential hypertension, benign 401.1 I10     His co-morbidities are impacting his QOL. He may be a candidate for [facility]. Will place referral for eval.  F/u with Dr Zella Richer for PEG tube eval. Senior resources will contact their office  Refer to ENT for larynx issues  He declines influenza vaccine  Keep scheduled appt with Janett Billow next month  >45 minutes spent with pt today with >50% of time coordinating care.  Yoshiye Kraft S. Perlie Gold  [facility] [address] [place], [place] [address] [phone] Cell (Monday-Friday 8 AM - 5 PM) [phone] After 5 PM and follow prompts

## 2015-01-29 NOTE — Patient Instructions (Addendum)
Follow up with specialists as scheduled  Continue current medications as ordered  Will refer to [facility] program for for close follow up of medical conditions  Keep appointment with Janett Billow as scheduled next month

## 2015-02-04 ENCOUNTER — Telehealth: Payer: Self-pay | Admitting: Internal Medicine

## 2015-02-04 NOTE — Telephone Encounter (Signed)
Called [facility] 01/29/15 & 02/03/15 and left messages to have patient set up for services.  02/04/15 @ 8:11 Received a call back from [place] from [place], she took the intake information for the patient and will call and set up an intake appointment - Maurene Capes the # for [employer] and also for patients daughter Elvin So, once she has the intake set up she will contact the office for records on the patient

## 2015-02-12 ENCOUNTER — Encounter (HOSPITAL_COMMUNITY): Payer: Self-pay

## 2015-02-12 ENCOUNTER — Emergency Department (HOSPITAL_COMMUNITY)
Admission: EM | Admit: 2015-02-12 | Discharge: 2015-02-12 | Disposition: A | Discharge status: Home / Self Care | Payer: Medicare Other | Attending: Emergency Medicine | Admitting: Emergency Medicine

## 2015-02-12 ENCOUNTER — Encounter (HOSPITAL_COMMUNITY): Payer: Self-pay | Admitting: Emergency Medicine

## 2015-02-12 DIAGNOSIS — J439 Emphysema, unspecified: Secondary | ICD-10-CM | POA: Insufficient documentation

## 2015-02-12 DIAGNOSIS — Z79899 Other long term (current) drug therapy: Secondary | ICD-10-CM | POA: Diagnosis not present

## 2015-02-12 DIAGNOSIS — Z8659 Personal history of other mental and behavioral disorders: Secondary | ICD-10-CM | POA: Diagnosis not present

## 2015-02-12 DIAGNOSIS — K219 Gastro-esophageal reflux disease without esophagitis: Secondary | ICD-10-CM | POA: Diagnosis not present

## 2015-02-12 DIAGNOSIS — Z872 Personal history of diseases of the skin and subcutaneous tissue: Secondary | ICD-10-CM | POA: Diagnosis not present

## 2015-02-12 DIAGNOSIS — Z87891 Personal history of nicotine dependence: Secondary | ICD-10-CM | POA: Insufficient documentation

## 2015-02-12 DIAGNOSIS — Y732 Prosthetic and other implants, materials and accessory gastroenterology and urology devices associated with adverse incidents: Secondary | ICD-10-CM | POA: Diagnosis not present

## 2015-02-12 DIAGNOSIS — F329 Major depressive disorder, single episode, unspecified: Secondary | ICD-10-CM | POA: Insufficient documentation

## 2015-02-12 DIAGNOSIS — H9190 Unspecified hearing loss, unspecified ear: Secondary | ICD-10-CM

## 2015-02-12 DIAGNOSIS — I10 Essential (primary) hypertension: Secondary | ICD-10-CM | POA: Insufficient documentation

## 2015-02-12 DIAGNOSIS — Z87440 Personal history of urinary (tract) infections: Secondary | ICD-10-CM

## 2015-02-12 DIAGNOSIS — T85598A Other mechanical complication of other gastrointestinal prosthetic devices, implants and grafts, initial encounter: Secondary | ICD-10-CM | POA: Insufficient documentation

## 2015-02-12 DIAGNOSIS — K9423 Gastrostomy malfunction: Secondary | ICD-10-CM | POA: Insufficient documentation

## 2015-02-12 DIAGNOSIS — Z8739 Personal history of other diseases of the musculoskeletal system and connective tissue: Secondary | ICD-10-CM | POA: Diagnosis not present

## 2015-02-12 DIAGNOSIS — G8929 Other chronic pain: Secondary | ICD-10-CM

## 2015-02-12 DIAGNOSIS — Z859 Personal history of malignant neoplasm, unspecified: Secondary | ICD-10-CM | POA: Insufficient documentation

## 2015-02-12 DIAGNOSIS — K9420 Gastrostomy complication, unspecified: Secondary | ICD-10-CM

## 2015-02-12 DIAGNOSIS — Z85819 Personal history of malignant neoplasm of unspecified site of lip, oral cavity, and pharynx: Secondary | ICD-10-CM | POA: Diagnosis not present

## 2015-02-12 DIAGNOSIS — E119 Type 2 diabetes mellitus without complications: Secondary | ICD-10-CM

## 2015-02-12 MED ORDER — FENTANYL CITRATE (PF) 100 MCG/2ML IJ SOLN
50.0000 ug | Freq: Once | INTRAMUSCULAR | Status: AC
  Administered 2015-02-12: 50 ug via INTRAMUSCULAR
  Filled 2015-02-12: qty 2

## 2015-02-12 NOTE — ED Notes (Signed)
Pt presents with report of feeding tube that came out at 0600 today.

## 2015-02-12 NOTE — ED Notes (Signed)
Pt verbalized understanding of d/c instructionsand follow-up care. No further questions/concerns, VSS, assisted to lobby in wheelchair. 

## 2015-02-12 NOTE — Discharge Instructions (Signed)
Gastrostomy Tube Home Guide, Adult °A gastrostomy tube is a tube that is surgically placed into the stomach. It is also called a "G-tube." G-tubes are used when a person is unable to eat and drink enough on their own to stay healthy. The tube is inserted into the stomach through a small cut (incision) in the skin. This tube is used for: °· Feeding. °· Giving medication. °GASTROSTOMY TUBE CARE °· Wash your hands with soap and water. °· Remove the old dressing (if any). Some styles of G-tubes may need a dressing inserted between the skin and the G-tube. Other types of G-tubes do not require a dressing. Ask your health care provider if a dressing is needed. °· Check the area where the tube enters the skin (insertion site) for redness, swelling, or pus-like (purulent) drainage. A small amount of clear or tan liquid drainage is normal. Check to make sure scar tissue (skin) is not growing around the insertion site. This could have a raised, bumpy appearance. °· A cotton swab can be used to clean the skin around the tube: °¨ When the G-tube is first put in, a normal saline solution or water can be used to clean the skin. °¨ Mild soap and warm water can be used when the skin around the G-tube site has healed. °¨ Roll the cotton swab around the G-tube insertion site to remove any drainage or crusting at the insertion site. °STOMACH RESIDUALS °Feeding tube residuals are the amount of liquids that are in the stomach at any given time. Residuals may be checked before giving feedings, medications, or as instructed by your health care provider. °· Ask your health care provider if there are instances when you would not start tube feedings depending on the amount or type of contents withdrawn from the stomach. °· Check residuals by attaching a syringe to the G-tube and pulling back on the syringe plunger. Note the amount, and return the residual back into the stomach. °FLUSHING THE G-TUBE °· The G-tube should be periodically  flushed with clean warm water to keep it from clogging. °¨ Flush the G-tube after feedings or medications. Draw up 30 mL of warm water in a syringe. Connect the syringe to the G-tube and slowly push the water into the tube. °¨ Do not push feedings, medications, or flushes rapidly. Flush the G-tube gently and slowly. °¨ Only use syringes made for G-tubes to flush medications or feedings. °¨ Your health care provider may want the G-tube flushed more often or with more water. If this is the case, follow your health care provider's instructions. °FEEDINGS °Your health care provider will determine whether feedings are given as a bolus (a certain amount given at one time and at scheduled times) or whether feedings will be given continuously on a feeding pump.  °· Formulas should be given at room temperature. °· If feedings are continuous, no more than 4 hours worth of feedings should be placed in the feeding bag. This helps prevent spoilage or accidental excess infusion. °· Cover and place unused formula in the refrigerator. °· If feedings are continuous, stop the feedings when medications or flushes are given. Be sure to restart the feedings. °· Feeding bags and syringes should be replaced as instructed by your health care provider. °GIVING MEDICATION  °· In general, it is best if all medications are in a liquid form for G-tube administration. Liquid medications are less likely to clog the G-tube. °¨ Mix the liquid medication with 30 mL (or amount recommended by   your health care provider) of warm water. °¨ Draw up the medication into the syringe. °¨ Attach the syringe to the G-tube and slowly push the mixture into the G-tube. °¨ After giving the medication, draw up 30 mL of warm water in the syringe and slowly flush the G-tube. °· For pills or capsules, check with your health care provider first before crushing medications. Some pills are not effective if they are crushed. Some capsules are sustained-release  medications. °¨ If appropriate, crush the pill or capsule and mix with 30 mL of warm water. Using the syringe, slowly push the medication through the tube, then flush the tube with another 30 mL of tap water. °G-TUBE PROBLEMS °G-tube was pulled out. °· Cause: May have been pulled out accidentally. °· Solutions: Cover the opening with clean dressing and tape. Call your health care provider right away. The G-tube should be put in as soon as possible (within 4 hours) so the G-tube opening (tract) does not close. The G-tube needs to be put in at a health care setting. An X-ray needs to be done to confirm placement before the G-tube can be used again. °Redness, irritation, soreness, or foul odor around the gastrostomy site. °· Cause: May be caused by leakage or infection. °· Solutions: Call your health care provider right away. °Large amount of leakage of fluid or mucus-like liquid present (a large amount means it soaks clothing). °· Cause: Many reasons could cause the G-tube to leak. °· Solutions: Call your health care provider to discuss the amount of leakage. °Skin or scar tissue appears to be growing where tube enters skin.  °· Cause: Tissue growth may develop around the insertion site if the G-tube is moved or pulled on excessively. °· Solutions: Secure tube with tape so that excess movement does not occur. Call your health care provider. °G-tube is clogged. °· Cause: Thick formula or medication. °· Solutions: Try to slowly push warm water into the tube with a large syringe. Never try to push any object into the tube to unclog it. Do not force fluid into the G-tube. If you are unable to unclog the tube, call your health care provider right away. °TIPS °· Head of bed (HOB) position refers to the upright position of a person's upper body. °¨ When giving medications or a feeding bolus, keep the HOB up as told by your health care provider. Do this during the feeding and for 1 hour after the feeding or medication  administration. °¨ If continuous feedings are being given, it is best to keep the HOB up as told by your health care provider. When ADLs (activities of daily living) are performed and the HOB needs to be flat, be sure to turn the feeding pump off. Restart the feeding pump when the HOB is returned to the recommended height. °· Do not pull or put tension on the tube. °· To prevent fluid backflow, kink the G-tube before removing the cap or disconnecting a syringe. °· Check the G-tube length every day. Measure from the insertion site to the end of the G-tube. If the length is longer than previous measurements, the tube may be coming out. Call your health care provider if you notice increasing G-tube length. °· Oral care, such as brushing teeth, must be continued. °· You may need to remove excess air (vent) from the G-tube. Your health care provider will tell you if this is needed. °· Always call your health care provider if you have questions or problems with the   G-tube. °SEEK IMMEDIATE MEDICAL CARE IF:  °· You have severe abdominal pain, tenderness, or abdominal bloating (distension). °· You have nausea or vomiting. °· You are constipated or have problems moving your bowels. °· The G-tube insertion site is red, swollen, has a foul smell, or has yellow or brown drainage. °· You have difficulty breathing or shortness of breath. °· You have a fever. °· You have a large amount of feeding tube residuals. °· The G-tube is clogged and cannot be flushed. °MAKE SURE YOU:  °· Understand these instructions. °· Will watch your condition. °· Will get help right away if you are not doing well or get worse. °  °This information is not intended to replace advice given to you by your health care provider. Make sure you discuss any questions you have with your health care provider. °  °Document Released: 04/10/2001 Document Revised: 06/16/2014 Document Reviewed: 10/07/2012 °Elsevier Interactive Patient Education ©2016 Elsevier Inc. ° °

## 2015-02-12 NOTE — Progress Notes (Signed)
Pt with long history of larngeal cancer and prior laryngectomy in 1999. Feeding tube placed and followed by Dr. Zella Richer.   His feeding tube came out and it was replaced with one here in the ED from [employer].  Both are 22 [place] in diameter.  He doesn't like the one the ED used to  replaced the one that fell out.  It works, but sizing for orifice is different. He thought it was to short, and I fixed that issue for him.    I check with IR and they checked their supplies, they do not have the one he wants, and the only MIC straight gastrostomy tube they have here is a 20 Fr. They also checked with [facility], and they do not have the one he wants in stock either.     I checked with our office and we do not have one that size in the office.  I have ask them to get one to the office and have patient come to the office next week and allow Dr. Zella Richer replace it with the tube they normally use.  I have ask the nursing staff here to help him get syringes that will fit the tube they have placed today, so he will have adequate supplies for the weekend.

## 2015-02-12 NOTE — ED Notes (Signed)
Dr. Audie Pinto placed 88f PEG tube  Without difficulty with gastric contents noted.  Pt reports the "tube won't work and requesting to call Dr. Jackolyn Confer at [facility] because he placed the PEG that came out this morning.  Office called and per Raquel Sarna, she will page Dr. Donne Hazel who is at the hospital at present.

## 2015-02-12 NOTE — ED Provider Notes (Signed)
CSN: [id number]     Arrival date & time 02/12/15  0845 History   First MD Initiated Contact with Patient 02/12/15 [phone]     No chief complaint on file.    HPI Pt presents with report of feeding tube that came out at 0600 today. Past Medical History  Diagnosis Date  . Hearing loss   . Change in voice   . Rash   . Trouble swallowing   . Difficulty urinating   . Cervical spondylosis 03/25/2011  . Emphysema 03/25/2011  . GERD (gastroesophageal reflux disease) 03/25/2011  . Chronic pain 03/25/2011  . 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 Xerostomia 03/25/2011  . Ulcer   . UTI (lower urinary tract infection)   . Allergy   . Chronic sinusitis 04/27/2011  . Impaired glucose tolerance 04/27/2011  . Cervical spondylolysis 04/30/2011    severe  . Cancer ([place]) 1999    throat cancer  . Essential hypertension, benign 12/03/2012  . Type II or unspecified type diabetes mellitus without mention of complication, uncontrolled 04/27/2011  . Depression    Past Surgical History  Procedure Laterality Date  . Peg tube placement  11/1999  . Tracheostomy  11/1999  . Gastrostomy tube placement     Family History  Problem Relation Age of Onset  . Stroke Other   . Diabetes Other   . Cancer Other     lung cancer  . Diabetes Mother   . Heart disease Father    Social History  Substance Use Topics  . Smoking status: Former Smoker -- 23 years    Quit date: 11/06/1996  . Smokeless tobacco: Never Used  . Alcohol Use: No    Review of Systems  Unable to perform ROS: Patient nonverbal      Allergies  Oxybutynin chloride and Codeine  Home Medications   Prior to Admission medications   Medication Sig Start Date End Date Taking? Authorizing Provider  AMBULATORY NON FORMULARY MEDICATION Trach supplies  Dx:C32.9 ,Z93.0 12/02/14   Gildardo Cranker, DO  AMBULATORY NON FORMULARY MEDICATION Feeding tube ( peg tube ) supplies 12/02/14   Gildardo Cranker, DO  cholestyramine light (PREVALITE) 4 G packet Take 1  packet (4 g total) by mouth 3 (three) times daily. 01/06/15   Gildardo Cranker, DO  diphenoxylate-atropine (LOMOTIL) 2.5-0.025 MG tablet Take 1 tablet by mouth 4 (four) times daily as needed for diarrhea or loose stools. 12/02/14   Gildardo Cranker, DO  ibuprofen (ADVIL,MOTRIN) 400 MG tablet take 1 tablet by mouth three times a day if needed for pain 12/21/14   [name], NP  losartan (COZAAR) 100 MG tablet Take 1 tablet (100 mg total) by mouth daily. 12/02/14   Monica Carter, DO  naphazoline-pheniramine (NAPHCON-A) 0.025-0.3 % ophthalmic solution Place 2 drops into both eyes 4 (four) times daily as needed for irritation (red eye).    Historical Provider, MD  Nutritional Supplements (FEEDING SUPPLEMENT, JEVITY 1.2 CAL,) LIQD Place 237 mLs into feeding tube See admin instructions. Pt takes 2 cans at breakfast, 2 cans at lunch, 2 cans at dinner and 2 at night 12/02/14   Gildardo Cranker, DO  oxymetazoline (12 HOUR NASAL SPRAY) 0.05 % nasal spray Place 2 sprays into the nose 2 (two) times daily as needed for congestion.     Historical Provider, MD  ranitidine (ZANTAC) 150 MG tablet Take 1 tablet (150 mg total) by mouth 2 (two) times daily. 01/22/15   [name], NP  tamsulosin (FLOMAX) 0.4 MG CAPS capsule Take  1 capsule (0.4 mg total) by mouth daily. 08/06/14   [name], NP  triamcinolone cream (KENALOG) 0.1 % Apply 1 application topically 2 (two) times daily. 10/26/14   [name], NP  zoster vaccine live, PF, (ZOSTAVAX) 28413 UNT/0.65ML injection Inject 19,400 Units into the skin once. 06/04/14   [name], NP   BP 130/88 mmHg  Pulse 80  Temp(Src) 99 F (37.2 C) (Oral)  Resp 16  SpO2 100% Physical Exam  Constitutional: He is oriented to person, place, and time. He appears well-developed and well-nourished. No distress.  HENT:  Head: Normocephalic and atraumatic.  Eyes: Pupils are equal, round, and reactive to light.  Neck: Normal range of motion.  Cardiovascular:  Normal rate and intact distal pulses.   Pulmonary/Chest: No respiratory distress.  Abdominal: Normal appearance. He exhibits no distension. There is no tenderness.    Musculoskeletal: Normal range of motion.  Neurological: He is alert and oriented to person, place, and time. He exhibits normal muscle tone. Coordination normal.  Skin: Skin is warm and dry. No rash noted.  Psychiatric: He has a normal mood and affect. His behavior is normal.  Nursing note and vitals reviewed.   ED Course  Gastrostomy tube replacement Date/Time: 02/12/2015 10:10 AM Performed by: Leonard Schwartz Authorized by: Leonard Schwartz Consent: Verbal consent obtained. Written consent not obtained. Risks and benefits: risks, benefits and alternatives were discussed Consent given by: patient Patient understanding: patient states understanding of the procedure being performed Patient identity confirmed: verbally with patient and arm band Local anesthesia used: no Patient sedated: no Patient tolerance: Patient tolerated the procedure well with no immediate complications   (including critical care time) A 22 French tube was placed without difficulty.  Gastric contents immediately return.     Patient was seen and evaluated by general surgery PA.  Arrangement was made for the patient to get the specific gastrostomy tube that he wants neck suite.  In the meantime we replaced his gastrostomy tube with a functioning similar type II.  The tube is draining gastric contents appear to be in place.  Patient is stable for discharge area in  MDM   Final diagnoses:  Complication of feeding tube [facility])        Leonard Schwartz, MD 02/12/15 1218

## 2015-02-12 NOTE — ED Notes (Signed)
Pt. returned for g- tube replacement , seen here this morning , evaluated by EDP and advised to follow up with surgeon for G-tube replacement .

## 2015-02-13 ENCOUNTER — Emergency Department (HOSPITAL_COMMUNITY)
Admission: EM | Admit: 2015-02-13 | Discharge: 2015-02-13 | Disposition: A | Discharge status: Home / Self Care | Payer: Medicare Other | Source: Home / Self Care | Attending: Emergency Medicine | Admitting: Emergency Medicine

## 2015-02-13 ENCOUNTER — Emergency Department (HOSPITAL_COMMUNITY): Payer: Medicare Other

## 2015-02-13 DIAGNOSIS — T85598A Other mechanical complication of other gastrointestinal prosthetic devices, implants and grafts, initial encounter: Secondary | ICD-10-CM

## 2015-02-13 MED ORDER — HYDROMORPHONE HCL 1 MG/ML IJ SOLN
1.0000 mg | Freq: Once | INTRAMUSCULAR | Status: AC
  Administered 2015-02-13: 1 mg via INTRAMUSCULAR
  Filled 2015-02-13: qty 1

## 2015-02-13 MED ORDER — IOHEXOL 300 MG/ML  SOLN
50.0000 mL | Freq: Once | INTRAMUSCULAR | Status: AC | PRN
  Administered 2015-02-13: 50 mL via ORAL

## 2015-02-13 NOTE — ED Notes (Signed)
Patient transported to X-ray 

## 2015-02-13 NOTE — Discharge Instructions (Signed)
Follow up with surgery as previously arranged.  If you were given medicines take as directed.  If you are on coumadin or contraceptives realize their levels and effectiveness is altered by many different medicines.  If you have any reaction (rash, tongues swelling, other) to the medicines stop taking and see a physician.    If your blood pressure was elevated in the ER make sure you follow up for management with a primary doctor or return for chest pain, shortness of breath or stroke symptoms.  Please follow up as directed and return to the ER or see a physician for new or worsening symptoms.  Thank you. Filed Vitals:   02/12/15 2215  BP: 164/91  Pulse: 100  Temp: 98.1 F (36.7 C)  TempSrc: Oral  Resp: 26  SpO2: 97%

## 2015-02-13 NOTE — ED Provider Notes (Signed)
CSN: [id number]     Arrival date & time 02/12/15  2209 History  By signing my name below, I, Irene Pap, attest that this documentation has been prepared under the direction and in the presence of Elnora Morrison, MD. Electronically Signed: Irene Pap, ED Scribe. 02/13/2015. 1:27 AM.   No chief complaint on file.  The history is provided by the patient. No language interpreter was used.  HPI Comments: Nicholas Cobb is a 64 y.o. Male with a hx of emphysema, osteoradionecrosis of the jaw, xerostomia, Type II DM, tracheotomy, and throat cancer who presents to the Emergency Department complaining of feeding tube complications onset one day ago. Pt was seen for the same earlier this morning and discharged with follow up with surgeon for G-tube replacement. Pt is non-verbal but communicates easily by writing down what he wants to say. He wrote down that the feeding tube came out and he is in pain due to this. He does not know how it happened but states that this has never happened before today. He reports that the last time he had it changed was August 2016. He states the G-Tube is his primary source of feeding. Pt is asking for pain medication. He states that he had an IV placed when he was seen this morning and that the tube they placed this morning fell out. Pt reports bleeding to the abdomen when the new tube fell out. He has bandages in place over the area. Per surgeon's note, they did not have the tube that the pt wanted, but the tube is ordered and supposed to be placed next week. Pt is allergic to Codeine.   Past Medical History  Diagnosis Date  . Hearing loss   . Change in voice   . Rash   . Trouble swallowing   . Difficulty urinating   . Cervical spondylosis 03/25/2011  . Emphysema 03/25/2011  . GERD (gastroesophageal reflux disease) 03/25/2011  . Chronic pain 03/25/2011  . 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 Xerostomia 03/25/2011  . Ulcer   . UTI (lower urinary tract infection)   .  Allergy   . Chronic sinusitis 04/27/2011  . Impaired glucose tolerance 04/27/2011  . Cervical spondylolysis 04/30/2011    severe  . Cancer ([place]) 1999    throat cancer  . Essential hypertension, benign 12/03/2012  . Type II or unspecified type diabetes mellitus without mention of complication, uncontrolled 04/27/2011  . Depression    Past Surgical History  Procedure Laterality Date  . Peg tube placement  11/1999  . Tracheostomy  11/1999  . Gastrostomy tube placement     Family History  Problem Relation Age of Onset  . Stroke Other   . Diabetes Other   . Cancer Other     lung cancer  . Diabetes Mother   . Heart disease Father    Social History  Substance Use Topics  . Smoking status: Former Smoker -- 23 years    Quit date: 11/06/1996  . Smokeless tobacco: Never Used  . Alcohol Use: No    Review of Systems  HENT: Positive for sore throat.   All other systems reviewed and are negative.  Allergies  Oxybutynin chloride and Codeine  Home Medications   Prior to Admission medications   Medication Sig Start Date End Date Taking? Authorizing Provider  AMBULATORY NON FORMULARY MEDICATION Trach supplies  Dx:C32.9 ,Z93.0 12/02/14   Gildardo Cranker, DO  AMBULATORY NON FORMULARY MEDICATION Feeding tube ( peg tube ) supplies 12/02/14  Gildardo Cranker, DO  cholestyramine light (PREVALITE) 4 G packet Take 1 packet (4 g total) by mouth 3 (three) times daily. 01/06/15   Gildardo Cranker, DO  diphenoxylate-atropine (LOMOTIL) 2.5-0.025 MG tablet Take 1 tablet by mouth 4 (four) times daily as needed for diarrhea or loose stools. 12/02/14   Gildardo Cranker, DO  ibuprofen (ADVIL,MOTRIN) 400 MG tablet take 1 tablet by mouth three times a day if needed for pain 12/21/14   [name], NP  losartan (COZAAR) 100 MG tablet Take 1 tablet (100 mg total) by mouth daily. 12/02/14   Monica Carter, DO  naphazoline-pheniramine (NAPHCON-A) 0.025-0.3 % ophthalmic solution Place 2 drops into both eyes 4 (four)  times daily as needed for irritation (red eye).    Historical Provider, MD  Nutritional Supplements (FEEDING SUPPLEMENT, JEVITY 1.2 CAL,) LIQD Place 237 mLs into feeding tube See admin instructions. Pt takes 2 cans at breakfast, 2 cans at lunch, 2 cans at dinner and 2 at night 12/02/14   Gildardo Cranker, DO  oxymetazoline (12 HOUR NASAL SPRAY) 0.05 % nasal spray Place 2 sprays into the nose 2 (two) times daily as needed for congestion.     Historical Provider, MD  ranitidine (ZANTAC) 150 MG tablet Take 1 tablet (150 mg total) by mouth 2 (two) times daily. 01/22/15   [name], NP  tamsulosin (FLOMAX) 0.4 MG CAPS capsule Take 1 capsule (0.4 mg total) by mouth daily. 08/06/14   [name], NP  triamcinolone cream (KENALOG) 0.1 % Apply 1 application topically 2 (two) times daily. 10/26/14   [name], NP  zoster vaccine live, PF, (ZOSTAVAX) 91478 UNT/0.65ML injection Inject 19,400 Units into the skin once. 06/04/14   [name], NP   BP 164/91 mmHg  Pulse 100  Temp(Src) 98.1 F (36.7 C) (Oral)  Resp 26  SpO2 97% Physical Exam  Constitutional: He is oriented to person, place, and time. He appears well-developed and well-nourished.  HENT:  Head: Normocephalic and atraumatic.  Eyes: EOM are normal. Pupils are equal, round, and reactive to light.  Neck: Normal range of motion. Neck supple.  Cardiovascular: Normal rate, regular rhythm and normal heart sounds.  Exam reveals no gallop and no friction rub.   No murmur heard. Pulmonary/Chest: Effort normal and breath sounds normal. He has no wheezes.  Abdominal: Soft. There is no tenderness.  Bandage in place over left abdomen; Dark blood at the orifice at LUQ; no peritonitis  Musculoskeletal: Normal range of motion.  Neurological: He is alert and oriented to person, place, and time.  Skin: Skin is warm and dry.  Psychiatric: He has a normal mood and affect. His behavior is normal.  Nursing note and vitals reviewed.   ED  Course  Procedures (including critical care time) Feeding tube replaced 22 [place], aspirated stomach contents. Indication dislodged Discussed plan with patient unable to obtain the specific feeding tube he requires. Patient has outpatient surgery follow-up. DIAGNOSTIC STUDIES: Oxygen Saturation is 97% on RA, normal by my interpretation.    COORDINATION OF CARE: 2:20 AM-Discussed treatment plan which includes replacement of G-tube with pt at bedside and pt agreed to plan.    Labs Review Labs Reviewed - No data to display  Imaging Review No results found. I have personally reviewed and evaluated these images and lab results as part of my medical decision-making.   EKG Interpretation None      MDM   Final diagnoses:  None   Patient presents for second visit  for dislodged feeding tube. Patient is frustrated as he wants the specific type of feeding tube that we are unable to get. I replaced the feeding tube with 22 [place]. Discussed continued follow-up with surgery.  Results and differential diagnosis were discussed with the patient/parent/guardian. Xrays were independently reviewed by myself.  Close follow up outpatient was discussed, comfortable with the plan.   Medications  HYDROmorphone (DILAUDID) injection 1 mg (1 mg Intramuscular Given 02/13/15 0310)    Filed Vitals:   02/12/15 2215  BP: 164/91  Pulse: 100  Temp: 98.1 F (36.7 C)  TempSrc: Oral  Resp: 26  SpO2: 97%    Final diagnoses:  Feeding tube dysfunction, initial encounter      Elnora Morrison, MD 02/13/15 [phone]

## 2015-02-17 ENCOUNTER — Other Ambulatory Visit: Payer: Self-pay | Admitting: *Deleted

## 2015-02-17 MED ORDER — TRIAMCINOLONE ACETONIDE 0.1 % EX CREA: 1.0000 "application " | TOPICAL_CREAM | Freq: Two times a day (BID) | CUTANEOUS | Status: DC

## 2015-02-17 NOTE — Telephone Encounter (Signed)
[facility] 

## 2015-02-22 ENCOUNTER — Other Ambulatory Visit: Payer: Self-pay | Admitting: *Deleted

## 2015-02-22 MED ORDER — IBUPROFEN 400 MG PO TABS: ORAL_TABLET | ORAL | Status: DC

## 2015-02-22 NOTE — Telephone Encounter (Signed)
[facility] 

## 2015-02-26 ENCOUNTER — Ambulatory Visit (INDEPENDENT_AMBULATORY_CARE_PROVIDER_SITE_OTHER): Payer: Medicare Other | Admitting: Internal Medicine

## 2015-02-26 ENCOUNTER — Telehealth: Payer: Self-pay

## 2015-02-26 ENCOUNTER — Encounter: Payer: Self-pay | Admitting: Internal Medicine

## 2015-02-26 VITALS — BP 120/70 | HR 58 | Temp 98.5°F | Resp 20 | Ht 69.0 in | Wt 164.2 lb

## 2015-02-26 DIAGNOSIS — Z930 Tracheostomy status: Secondary | ICD-10-CM

## 2015-02-26 DIAGNOSIS — C329 Malignant neoplasm of larynx, unspecified: Secondary | ICD-10-CM

## 2015-02-26 DIAGNOSIS — L03211 Cellulitis of face: Secondary | ICD-10-CM | POA: Diagnosis not present

## 2015-02-26 DIAGNOSIS — G8929 Other chronic pain: Secondary | ICD-10-CM

## 2015-02-26 MED ORDER — DOXYCYCLINE HYCLATE 100 MG PO TABS: 100.0000 mg | ORAL_TABLET | Freq: Two times a day (BID) | ORAL | Status: DC

## 2015-02-26 MED ORDER — CIPROFLOXACIN HCL 500 MG PO TABS: 500.0000 mg | ORAL_TABLET | Freq: Two times a day (BID) | ORAL | Status: DC

## 2015-02-26 NOTE — Progress Notes (Signed)
Patient ID: ANTWOIN LACKEY, male   DOB: 1951/01/13, 65 y.o.   MRN: [id number]    Location:    [facility]   Place of Service:   OFFICE  Chief Complaint  Patient presents with  . 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 He is requesting Rx for parotid gland infection that was previously rx by Dr Caryn Section. He was given Doxy 161m BID x 10days and cipro 5090mBID x 10 days for it in the past. No f/c but increased pain and swelling. He as a hx laryngeal CA s/p radical neck sx, XRT; hx osteoradionecrosis of jaw. He has ben tx for facial celluitis in the past following cancer tx. He has dysphagia and is unable to open mouth due to osteoradionecrosis. He has a Peg and trach  Past Medical History  Diagnosis Date  . Hearing loss   . Change in voice   . Rash   . Trouble swallowing   . Difficulty urinating   . Cervical spondylosis 03/25/2011  . Emphysema 03/25/2011  . GERD (gastroesophageal reflux disease) 03/25/2011  . Chronic pain 03/25/2011  . 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 Xerostomia 03/25/2011  . Ulcer   . UTI (lower urinary tract infection)   . Allergy   . Chronic sinusitis 04/27/2011  . Impaired glucose tolerance 04/27/2011  . Cervical spondylolysis 04/30/2011    severe  . [place]    [place]  . Essential hypertension, benign 12/03/2012  . Type II or unspecified type diabetes mellitus without mention of complication, uncontrolled 04/27/2011  . Depression     Past Surgical History  Procedure Laterality Date  . Peg tube placement  11/1999  . Tracheostomy  11/1999  . Gastrostomy tube placement      Patient Care Team: JeLauree ChandlerNP as PCP - General (Nurse Practitioner)  Social History   Social History  . Marital Status: Single    Spouse Name: N/A    . Number of Children: N/A  . Years of Education: 12   Occupational History  . retired    Social History Main Topics  . Smoking status: Former Smoker -- 23 years    Quit date: 11/06/1996  . Smokeless tobacco: Never Used  . Alcohol Use: No  . Drug Use: No  . Sexual Activity: Not on file   Other Topics Concern  . Not on file   Social History Narrative   Pt. Lives in a apartment, 1 story, 2 people live in the home, No pet's. Pt. Does exercise (walk) and yes pt. Has a living will.     reports that he quit smoking about 18 years ago. He has never used smokeless tobacco. He reports that he does not drink alcohol or use illicit drugs.  Allergies  Allergen Reactions  . Oxybutynin Chloride Nausea And Vomiting  . Codeine Nausea And Vomiting and Rash    Medications: Patient's Medications  New Prescriptions   No medications on file  Previous Medications   AMBULATORY NON FORMULARY MEDICATION    Trach supplies  Dx:C32.9 ,Z93.0   AMBULATORY NON FORMULARY MEDICATION    Feeding tube ( peg tube ) supplies   CHOLESTYRAMINE LIGHT (PREVALITE) 4 G PACKET    Take 1 packet (4 g total) by  mouth 3 (three) times daily.   DIPHENOXYLATE-ATROPINE (LOMOTIL) 2.5-0.025 MG TABLET    Take 1 tablet by mouth 4 (four) times daily as needed for diarrhea or loose stools.   IBUPROFEN (ADVIL,MOTRIN) 400 MG TABLET    Take one tablet by mouth three times daily as needed for pain   LOSARTAN (COZAAR) 100 MG TABLET    Take 1 tablet (100 mg total) by mouth daily.   NAPHAZOLINE-PHENIRAMINE (NAPHCON-A) 0.025-0.3 % OPHTHALMIC SOLUTION    Place 2 drops into both eyes 4 (four) times daily as needed for irritation (red eye).   NUTRITIONAL SUPPLEMENTS (FEEDING SUPPLEMENT, JEVITY 1.2 CAL,) LIQD    Place 237 mLs into feeding tube See admin instructions. Pt takes 2 cans at breakfast, 2 cans at lunch, 2 cans at dinner and 2 at night   OXYMETAZOLINE (12 HOUR NASAL SPRAY) 0.05 % NASAL SPRAY    Place 2 sprays into the nose 2 (two)  times daily as needed for congestion.    RANITIDINE (ZANTAC) 150 MG TABLET    Take 1 tablet (150 mg total) by mouth 2 (two) times daily.   TAMSULOSIN (FLOMAX) 0.4 MG CAPS CAPSULE    Take 1 capsule (0.4 mg total) by mouth daily.   TRIAMCINOLONE CREAM (KENALOG) 0.1 %    Apply 1 application topically 2 (two) times daily.   ZOSTER VACCINE LIVE, PF, (ZOSTAVAX) 93267 UNT/0.65ML INJECTION    Inject 19,400 Units into the skin once.  Modified Medications   No medications on file  Discontinued Medications   No medications on file    Review of Systems  Unable to perform ROS: Patient nonverbal  communicates by writing requests  Filed Vitals:   02/26/15 0810  BP: 120/70  Pulse: 58  Temp: 98.5 F (36.9 C)  TempSrc: Oral  Resp: 20  Height: _0  (1.753 m)  Weight: 164 lb 3.2 oz (74.481 kg)  SpO2: 90%   Body mass index is 24.24 kg/(m^2).  Physical Exam  Constitutional: He appears well-developed. No distress.  Frail appearing in NAD  HENT:  Head:    He is unable to open mouth  Musculoskeletal: He exhibits edema and tenderness.  Skin: Skin is warm and dry.  Psychiatric: He has a normal mood and affect. His behavior is normal.     Labs reviewed: No visits with results within 3 Month(s) from this visit. Latest known visit with results is:  Admission on 10/28/2014, Discharged on 10/28/2014  Component Date Value Ref Range Status  . Sodium 10/28/2014 131* 135 - 145 mmol/L Final  . Potassium 10/28/2014 4.4  3.5 - 5.1 mmol/L Final  . Chloride 10/28/2014 93* 101 - 111 mmol/L Final  . CO2 10/28/2014 29  22 - 32 mmol/L Final  . Glucose, Bld 10/28/2014 100* 65 - 99 mg/dL Final  . BUN 10/28/2014 13  6 - 20 mg/dL Final  . Creatinine, Ser 10/28/2014 0.83  0.61 - 1.24 mg/dL Final  . Calcium 10/28/2014 9.9  8.9 - 10.3 mg/dL Final  . GFR calc non Af Amer 10/28/2014 >60  >60 mL/min Final  . GFR calc Af Amer 10/28/2014 >60  >60 mL/min Final   Comment: (NOTE) The eGFR has been calculated using  the CKD EPI equation. This calculation has not been validated in all clinical situations. eGFR's persistently <60 mL/min signify possible Chronic Kidney Disease.   . Anion gap 10/28/2014 9  5 - 15 Final  . WBC 10/28/2014 3.9* 4.0 - 10.5 K/uL Final  . RBC 10/28/2014 5.42  4.22 - 5.81 MIL/uL Final  . Hemoglobin 10/28/2014 17.1* 13.0 - 17.0 g/dL Final  . HCT 10/28/2014 49.1  39.0 - 52.0 % Final  . MCV 10/28/2014 90.6  78.0 - 100.0 fL Final  . MCH 10/28/2014 31.5  26.0 - 34.0 pg Final  . MCHC 10/28/2014 34.8  30.0 - 36.0 g/dL Final  . RDW 10/28/2014 12.8  11.5 - 15.5 % Final  . Platelets 10/28/2014 212  150 - 400 K/uL Final  . Neutrophils Relative % 10/28/2014 59   Final  . Neutro Abs 10/28/2014 2.3  1.7 - 7.7 K/uL Final  . Lymphocytes Relative 10/28/2014 24   Final  . Lymphs Abs 10/28/2014 0.9  0.7 - 4.0 K/uL Final  . Monocytes Relative 10/28/2014 15   Final  . Monocytes Absolute 10/28/2014 0.6  0.1 - 1.0 K/uL Final  . Eosinophils Relative 10/28/2014 2   Final  . Eosinophils Absolute 10/28/2014 0.1  0.0 - 0.7 K/uL Final  . Basophils Relative 10/28/2014 0   Final  . Basophils Absolute 10/28/2014 0.0  0.0 - 0.1 K/uL Final  . Sed Rate 10/28/2014 54* 0 - 16 mm/hr Final  . CRP 10/28/2014 2.3* <1.0 mg/dL Final   Performed at [facility]    Dg Abd 1 View  02/13/2015  CLINICAL DATA:  Assess G-tube placement.  Initial encounter. EXAM: ABDOMEN - 1 VIEW COMPARISON:  Abdominal radiograph performed 12/08/2011 FINDINGS: Contrast injected into the G-tube fills the stomach, as expected, with the balloon overlying the body of the stomach. The visualized bowel gas pattern is unremarkable. Scattered air and stool filled loops of colon are seen; no abnormal dilatation of small bowel loops is seen to suggest small bowel obstruction. No free intra-abdominal air is identified, though evaluation for free air is limited on a single supine view. The visualized osseous structures are within normal limits;  the sacroiliac joints are unremarkable in appearance. IMPRESSION: Contrast injected into the G-tube fills the stomach, as expected. Electronically Signed   By: Garald Balding M.D.   On: 02/13/2015 04:36     Assessment/Plan   ICD-9-CM ICD-10-CM   1. Tracheostomy in place [facility]) V44.0 Z93.0 CANCELED: Ambulatory referral to ENT  2. Laryngeal cancer ([place]) 161.9 C32.9 CANCELED: Ambulatory referral to ENT  3. Chronic pain due to osteoradionecrosis of jaw 338.29 G89.29   4. Cellulitis of face 682.0 L03.211 doxycycline (VIBRA-TABS) 100 MG tablet     ciprofloxacin (CIPRO) 500 MG tablet   right; recurrent   Follow up with ENT as scheduled  Continue trach clinic follow up  Keep appt with central [place] surgery for G tube management next month  Continue current medications as ordered  Follow up with Janett Billow as scheduled next week  PACE program referral for comprehensive care pending  Kutler Vanvranken S. Perlie Gold  [facility] [address] [place], [place] [address] [phone] Cell (Monday-Friday 8 AM - 5 PM) [phone] After 5 PM and follow prompts

## 2015-02-26 NOTE — Telephone Encounter (Signed)
Nicholas Cobb we can not make any more appointments for you with ENT doctor because you were a no show  for your last three appointments you will have to contact your Senior Resources advocate that makes your appointments for you to make it. There number is [phone]   The ENT number is [phone] address is [address] And as for your supplies we don't have any one else to switch you to please ask the senior resources people if they have any suggestions we would be happy to send a referral to them for you. This letter was given to Nicholas Cobb and explained also he gave me [place] supplies to contact for him and his daughter's number to call with results form call.

## 2015-02-26 NOTE — Patient Instructions (Addendum)
Follow up with ENT as scheduled  Continue trach clinic follow up  Keep appt with central [place] surgery for G tube management  Continue current medications as ordered  Follow up with Janett Billow as scheduled next week  PACE program referral for comprehensive care

## 2015-03-02 ENCOUNTER — Other Ambulatory Visit: Payer: Self-pay | Admitting: *Deleted

## 2015-03-02 MED ORDER — AMBULATORY NON FORMULARY MEDICATION: Status: DC

## 2015-03-02 NOTE — Telephone Encounter (Signed)
Nicholas Cobb with [facility] [phone] called and stated that she needed an updated order for [facility]. Faxed order to her at fax#: [phone]

## 2015-03-04 ENCOUNTER — Encounter: Payer: Self-pay | Admitting: Nurse Practitioner

## 2015-03-04 ENCOUNTER — Ambulatory Visit (INDEPENDENT_AMBULATORY_CARE_PROVIDER_SITE_OTHER): Payer: Medicare Other | Admitting: Nurse Practitioner

## 2015-03-04 VITALS — BP 132/80 | HR 70 | Temp 99.6°F | Resp 20 | Ht 69.0 in | Wt 165.6 lb

## 2015-03-04 DIAGNOSIS — M272 Inflammatory conditions of jaws: Secondary | ICD-10-CM

## 2015-03-04 DIAGNOSIS — L299 Pruritus, unspecified: Secondary | ICD-10-CM | POA: Diagnosis not present

## 2015-03-04 DIAGNOSIS — C329 Malignant neoplasm of larynx, unspecified: Secondary | ICD-10-CM | POA: Diagnosis not present

## 2015-03-04 DIAGNOSIS — G8929 Other chronic pain: Secondary | ICD-10-CM

## 2015-03-04 DIAGNOSIS — Y842 Radiological procedure and radiotherapy as the cause of abnormal reaction of the patient, or of later complication, without mention of misadventure at the time of the procedure: Secondary | ICD-10-CM

## 2015-03-04 DIAGNOSIS — I10 Essential (primary) hypertension: Secondary | ICD-10-CM

## 2015-03-04 DIAGNOSIS — E119 Type 2 diabetes mellitus without complications: Secondary | ICD-10-CM | POA: Diagnosis not present

## 2015-03-04 MED ORDER — HYDROXYZINE PAMOATE 25 MG PO CAPS: 25.0000 mg | ORAL_CAPSULE | Freq: Three times a day (TID) | ORAL | Status: DC | PRN

## 2015-03-04 NOTE — Patient Instructions (Signed)
May try Claritin 10 mg daily and vistaril 25 mg every 6 hours as needed for itch  We will work on getting your ENT records  Cont follow up with trach clinic   Will get lab work today

## 2015-03-04 NOTE — Progress Notes (Signed)
Patient ID: Nicholas Cobb, male   DOB: [birth year], 65 y.o.   MRN: [id number]    PCP: [name], NP   Allergies  Allergen Reactions  . Oxybutynin Chloride Nausea And Vomiting  . Codeine Nausea And Vomiting and Rash    Chief Complaint  Patient presents with  . Medical Management of Chronic Issues     f/u for HTN, itchy skin,     HPI: Patient is a 65 y.o. male seen in the office today for routine follow up. Was seen last week by Dr Eulas Post for htn.  Pt conts on  Doxy 100mg  BID x 10 days and cipro 500mg  BID x 10 days. He as a hx laryngeal CA s/p radical neck sx, XRT; hx osteoradionecrosis of jaw. He has ben tx for facial celluitis in the past following cancer tx. He has dysphagia and is unable to open mouth due to osteoradionecrosis. He has a Peg and trach. Nonverbal and writes everything down.   Pt reports ongoing itchy dry skin. Has tried cream and rash and itch always returns. Would like Rx cream for this. Itching to left lower leg and bilateral arms and elbows. With multiple areas with scabs from scratching. Using gold bond lotion at this time.   Needing medical records for ENT referral. Has missed 3 appts with ENT, needing to make his own appt at this time.   Taking blood pressure medication daily.  Review of Systems:  Review of Systems  Unable to perform ROS: Patient nonverbal    Past Medical History  Diagnosis Date  . Hearing loss   . Change in voice   . Rash   . Trouble swallowing   . Difficulty urinating   . Cervical spondylosis 03/25/2011  . Emphysema 03/25/2011  . GERD (gastroesophageal reflux disease) 03/25/2011  . Chronic pain 03/25/2011  . 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 Xerostomia 03/25/2011  . Ulcer   . UTI (lower urinary tract infection)   . Allergy   . Chronic sinusitis 04/27/2011  . Impaired glucose tolerance 04/27/2011  . Cervical spondylolysis 04/30/2011    severe  . Cancer ([place]) 1999    throat cancer  . Essential hypertension, benign  12/03/2012  . Type II or unspecified type diabetes mellitus without mention of complication, uncontrolled 04/27/2011  . Depression    Past Surgical History  Procedure Laterality Date  . Peg tube placement  11/1999  . Tracheostomy  11/1999  . Gastrostomy tube placement     Social History:   reports that he quit smoking about 18 years ago. He has never used smokeless tobacco. He reports that he does not drink alcohol or use illicit drugs.  Family History  Problem Relation Age of Onset  . Stroke Other   . Diabetes Other   . Cancer Other     lung cancer  . Diabetes Mother   . Heart disease Father     Medications: Patient's Medications  New Prescriptions   No medications on file  Previous Medications   AMBULATORY NON FORMULARY MEDICATION    Feeding tube ( peg tube ) supplies   AMBULATORY NON FORMULARY MEDICATION    Trach supplies  Dx:C32.9 ,Z93.0   CHOLESTYRAMINE LIGHT (PREVALITE) 4 G PACKET    Take 1 packet (4 g total) by mouth 3 (three) times daily.   CIPROFLOXACIN (CIPRO) 500 MG TABLET    Take 1 tablet (500 mg total) by mouth 2 (two) times daily.   DIPHENOXYLATE-ATROPINE (LOMOTIL) 2.5-0.025 MG TABLET  Take 1 tablet by mouth 4 (four) times daily as needed for diarrhea or loose stools.   DOXYCYCLINE (VIBRA-TABS) 100 MG TABLET    Take 1 tablet (100 mg total) by mouth 2 (two) times daily.   IBUPROFEN (ADVIL,MOTRIN) 400 MG TABLET    Take one tablet by mouth three times daily as needed for pain   LOSARTAN (COZAAR) 100 MG TABLET    Take 1 tablet (100 mg total) by mouth daily.   NAPHAZOLINE-PHENIRAMINE (NAPHCON-A) 0.025-0.3 % OPHTHALMIC SOLUTION    Place 2 drops into both eyes 4 (four) times daily as needed for irritation (red eye).   NUTRITIONAL SUPPLEMENTS (FEEDING SUPPLEMENT, JEVITY 1.2 CAL,) LIQD    Place 237 mLs into feeding tube See admin instructions. Pt takes 2 cans at breakfast, 2 cans at lunch, 2 cans at dinner and 2 at night   OXYMETAZOLINE (12 HOUR NASAL SPRAY) 0.05 % NASAL  SPRAY    Place 2 sprays into the nose 2 (two) times daily as needed for congestion.    RANITIDINE (ZANTAC) 150 MG TABLET    Take 1 tablet (150 mg total) by mouth 2 (two) times daily.   TAMSULOSIN (FLOMAX) 0.4 MG CAPS CAPSULE    Take 1 capsule (0.4 mg total) by mouth daily.   TRIAMCINOLONE CREAM (KENALOG) 0.1 %    Apply 1 application topically 2 (two) times daily.   ZOSTER VACCINE LIVE, PF, (ZOSTAVAX) 91478 UNT/0.65ML INJECTION    Inject 19,400 Units into the skin once.  Modified Medications   No medications on file  Discontinued Medications   No medications on file     Physical Exam:  Filed Vitals:   03/04/15 1313  BP: 132/80  Pulse: 70  Temp: 99.6 F (37.6 C)  TempSrc: Oral  Resp: 20  Height: 5\' 9"  (1.753 m)  Weight: 165 lb 9.6 oz (75.116 kg)  SpO2: 96%   Body mass index is 24.44 kg/(m^2).  Physical Exam  Constitutional: He appears well-developed and well-nourished. No distress.  HENT:  R>L facial swelling extending into neck  Neck:  Trach intact  Cardiovascular: Normal rate, regular rhythm and normal heart sounds.   Pulmonary/Chest: Effort normal and breath sounds normal. No respiratory distress.  Musculoskeletal:  Gait unsteady  Neurological: He is alert.  Skin: Skin is warm and dry. He is not diaphoretic.  Psychiatric: He has a normal mood and affect. His behavior is normal.    Labs reviewed: Basic Metabolic Panel:  Recent Labs  03/24/14 0835 03/28/14 0224 09/01/14 1453 10/28/14 1600  NA 136 133* 131* 131*  K 4.7 4.3 4.7 4.4  CL 94* 97 91* 93*  CO2 28  --  26 29  GLUCOSE 77 208* 105* 100*  BUN 19 22 16 13   CREATININE 1.01 1.10 0.93 0.83  CALCIUM 9.5  --  9.4 9.9   Liver Function Tests:  Recent Labs  03/24/14 0835 09/01/14 1453  AST 30 24  ALT 23 15  ALKPHOS 118* 110  BILITOT 0.4 0.2  PROT 7.0 6.5  ALBUMIN 4.2 4.0   No results for input(s): LIPASE, AMYLASE in the last 8760 hours. No results for input(s): AMMONIA in the last 8760  hours. CBC:  Recent Labs  03/28/14 0212 03/28/14 0224 09/01/14 1453 10/28/14 1600  WBC 5.0  --  3.8 3.9*  NEUTROABS 2.5  --  2.0 2.3  HGB 13.9 15.6  --  17.1*  HCT 41.9 46.0 39.5 49.1  MCV 92.3  --  90 90.6  PLT 195  --  232 212   Lipid Panel: No results for input(s): CHOL, HDL, LDLCALC, TRIG, CHOLHDL, LDLDIRECT in the last 8760 hours. TSH: No results for input(s): TSH in the last 8760 hours. A1C: Lab Results  Component Value Date   HGBA1C 6.6* 03/24/2014     Assessment/Plan 1. Essential hypertension, benign -well controlled on current medication  -to cont on losartan  - CBC with Differential  2. Laryngeal cancer [facility]) -now with trach, pt with ongoing follow up with trach clinic.   3. Osteoradionecrosis of jaw -pt to schedule follow up to ENT, referral has been placed, pt has missed scheduled appts and now he is to call to reschedule, pt aware of this,  4. Chronic pain -conts on Advil with good relief  - will follow up renal function  -Comprehensive metabolic panel  5. Pruritus of skin -no rash noted, skin does not appear dry but scabs noted to left lower leg and bilateral elbows from itch.  -to cont to use lotion, may try Eucerin lotion -to start Claritin 10 mg daily - may hydrOXYzine (VISTARIL) 25 MG capsule; Take 1 capsule (25 mg total) by mouth 3 (three) times daily as needed. (pt with noted intolerance to oxybutynin -nausea and vomiting- and cross reaction noted) noted if pt experience nausea and vomiting to stop medication and notify us.  - TSH - Comprehensive metabolic panel  6. Type 2 diabetes mellitus without complication, without long-term current use of insulin (HCC) -conts on tube feeding, not requiring medication  - Hemoglobin A1c  Sidda Humm K. Harle Battiest  [facility] [phone] 8 am - 5 pm) [phone] (after hours)

## 2015-03-05 MED ORDER — AMBULATORY NON FORMULARY MEDICATION: Status: DC

## 2015-03-05 NOTE — Telephone Encounter (Signed)
Received fax from [place] [place] [phone] wanting a more detailed Rx of [employer] supplies and listed what we needed to place on Rx. Rx updated and faxed back to [phone]

## 2015-03-05 NOTE — Addendum Note (Signed)
Addended by: Rafael Bihari A on: 03/05/2015 11:14 AM   Modules accepted: Orders

## 2015-03-07 ENCOUNTER — Encounter (HOSPITAL_COMMUNITY): Payer: Self-pay

## 2015-03-07 ENCOUNTER — Emergency Department (HOSPITAL_COMMUNITY): Payer: Medicare Other

## 2015-03-07 ENCOUNTER — Emergency Department (HOSPITAL_COMMUNITY)
Admission: EM | Admit: 2015-03-07 | Discharge: 2015-03-08 | Disposition: A | Discharge status: Home / Self Care | Payer: Medicare Other | Attending: Emergency Medicine | Admitting: Emergency Medicine

## 2015-03-07 DIAGNOSIS — Z87891 Personal history of nicotine dependence: Secondary | ICD-10-CM | POA: Diagnosis not present

## 2015-03-07 DIAGNOSIS — G8929 Other chronic pain: Secondary | ICD-10-CM | POA: Insufficient documentation

## 2015-03-07 DIAGNOSIS — M47892 Other spondylosis, cervical region: Secondary | ICD-10-CM | POA: Diagnosis not present

## 2015-03-07 DIAGNOSIS — Z7952 Long term (current) use of systemic steroids: Secondary | ICD-10-CM | POA: Diagnosis not present

## 2015-03-07 DIAGNOSIS — Z87440 Personal history of urinary (tract) infections: Secondary | ICD-10-CM | POA: Diagnosis not present

## 2015-03-07 DIAGNOSIS — Z792 Long term (current) use of antibiotics: Secondary | ICD-10-CM | POA: Diagnosis not present

## 2015-03-07 DIAGNOSIS — Z85819 Personal history of malignant neoplasm of unspecified site of lip, oral cavity, and pharynx: Secondary | ICD-10-CM | POA: Insufficient documentation

## 2015-03-07 DIAGNOSIS — Z79899 Other long term (current) drug therapy: Secondary | ICD-10-CM | POA: Diagnosis not present

## 2015-03-07 DIAGNOSIS — K219 Gastro-esophageal reflux disease without esophagitis: Secondary | ICD-10-CM | POA: Insufficient documentation

## 2015-03-07 DIAGNOSIS — J439 Emphysema, unspecified: Secondary | ICD-10-CM | POA: Diagnosis not present

## 2015-03-07 DIAGNOSIS — F329 Major depressive disorder, single episode, unspecified: Secondary | ICD-10-CM | POA: Insufficient documentation

## 2015-03-07 DIAGNOSIS — E119 Type 2 diabetes mellitus without complications: Secondary | ICD-10-CM | POA: Insufficient documentation

## 2015-03-07 DIAGNOSIS — H9190 Unspecified hearing loss, unspecified ear: Secondary | ICD-10-CM | POA: Insufficient documentation

## 2015-03-07 DIAGNOSIS — I10 Essential (primary) hypertension: Secondary | ICD-10-CM | POA: Diagnosis not present

## 2015-03-07 DIAGNOSIS — J9500 Unspecified tracheostomy complication: Secondary | ICD-10-CM | POA: Diagnosis not present

## 2015-03-07 MED ORDER — OXYMETAZOLINE HCL 0.05 % NA SOLN
1.0000 | Freq: Once | NASAL | Status: AC
  Administered 2015-03-07: 1 via NASAL
  Filled 2015-03-07: qty 15

## 2015-03-07 NOTE — Discharge Instructions (Signed)

## 2015-03-07 NOTE — ED Provider Notes (Signed)
CSN: [id number]     Arrival date & time 03/07/15  1924 History   First MD Initiated Contact with Patient 03/07/15 1929     Chief Complaint  Patient presents with  . Tracheostomy Tube Change     (Consider location/radiation/quality/duration/timing/severity/associated sxs/prior Treatment) HPI  The patient is a 65 year old male, he has a complicated medical history involving his face and neck with a history of cancer requiring tracheostomy, he has a PEG tube, he states that this evening he was having difficulty breathing out of his trach, he reports that he could not suction because he had no saline solution, eventually he had to take his trach out to breathe because he felt like he was suffocating. He denies any other recent symptoms. He feels fine now that his trach is out but needs it replaced.  Past Medical History  Diagnosis Date  . Hearing loss   . Change in voice   . Rash   . Trouble swallowing   . Difficulty urinating   . Cervical spondylosis 03/25/2011  . Emphysema 03/25/2011  . GERD (gastroesophageal reflux disease) 03/25/2011  . Chronic pain 03/25/2011  . 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 Xerostomia 03/25/2011  . Ulcer   . UTI (lower urinary tract infection)   . Allergy   . Chronic sinusitis 04/27/2011  . Impaired glucose tolerance 04/27/2011  . Cervical spondylolysis 04/30/2011    severe  . Cancer ([place]) 1999    throat cancer  . Essential hypertension, benign 12/03/2012  . Type II or unspecified type diabetes mellitus without mention of complication, uncontrolled 04/27/2011  . Depression    Past Surgical History  Procedure Laterality Date  . Peg tube placement  11/1999  . Tracheostomy  11/1999  . Gastrostomy tube placement     Family History  Problem Relation Age of Onset  . Stroke Other   . Diabetes Other   . Cancer Other     lung cancer  . Diabetes Mother   . Heart disease Father    Social History  Substance Use Topics  . Smoking status: Former Smoker -- 23  years    Quit date: 11/06/1996  . Smokeless tobacco: Never Used  . Alcohol Use: No    Review of Systems  All other systems reviewed and are negative.     Allergies  Oxybutynin chloride and Codeine  Home Medications   Prior to Admission medications   Medication Sig Start Date End Date Taking? Authorizing Provider  AMBULATORY NON FORMULARY MEDICATION Feeding tube ( peg tube ) supplies 12/02/14   Gildardo Cranker, DO  AMBULATORY NON FORMULARY MEDICATION Trach (Shiley 6.0 XLT UP uncuffed) Trach Care Kit, Trach Holders, Disposable inner cannulas  Dx:C32.9 ,Z93.0 03/05/15   Gildardo Cranker, DO  cholestyramine light (PREVALITE) 4 G packet Take 1 packet (4 g total) by mouth 3 (three) times daily. 01/06/15   Gildardo Cranker, DO  ciprofloxacin (CIPRO) 500 MG tablet Take 1 tablet (500 mg total) by mouth 2 (two) times daily. 02/26/15   Gildardo Cranker, DO  diphenoxylate-atropine (LOMOTIL) 2.5-0.025 MG tablet Take 1 tablet by mouth 4 (four) times daily as needed for diarrhea or loose stools. 12/02/14   Gildardo Cranker, DO  doxycycline (VIBRA-TABS) 100 MG tablet Take 1 tablet (100 mg total) by mouth 2 (two) times daily. 02/26/15   Gildardo Cranker, DO  hydrOXYzine (VISTARIL) 25 MG capsule Take 1 capsule (25 mg total) by mouth 3 (three) times daily as needed. 03/04/15   [name], NP  ibuprofen (ADVIL,MOTRIN)  400 MG tablet Take one tablet by mouth three times daily as needed for pain 02/22/15   Tiffany L Reed, DO  losartan (COZAAR) 100 MG tablet Take 1 tablet (100 mg total) by mouth daily. 12/02/14   Monica Carter, DO  naphazoline-pheniramine (NAPHCON-A) 0.025-0.3 % ophthalmic solution Place 2 drops into both eyes 4 (four) times daily as needed for irritation (red eye).    Historical Provider, MD  Nutritional Supplements (FEEDING SUPPLEMENT, JEVITY 1.2 CAL,) LIQD Place 237 mLs into feeding tube See admin instructions. Pt takes 2 cans at breakfast, 2 cans at lunch, 2 cans at dinner and 2 at night 12/02/14    Gildardo Cranker, DO  oxymetazoline (12 HOUR NASAL SPRAY) 0.05 % nasal spray Place 2 sprays into the nose 2 (two) times daily as needed for congestion.     Historical Provider, MD  ranitidine (ZANTAC) 150 MG tablet Take 1 tablet (150 mg total) by mouth 2 (two) times daily. 01/22/15   [name], NP  tamsulosin (FLOMAX) 0.4 MG CAPS capsule Take 1 capsule (0.4 mg total) by mouth daily. 08/06/14   [name], NP  triamcinolone cream (KENALOG) 0.1 % Apply 1 application topically 2 (two) times daily. 02/17/15   [name], NP  zoster vaccine live, PF, (ZOSTAVAX) 16109 UNT/0.65ML injection Inject 19,400 Units into the skin once. Patient not taking: Reported on 02/26/2015 06/04/14   [name], NP   BP 166/98 mmHg  Pulse 100  Temp(Src) 98.8 F (37.1 C)  Resp 20  SpO2 96% Physical Exam  Constitutional: He appears well-developed and well-nourished. No distress.  HENT:  Head: Normocephalic and atraumatic.  Mouth/Throat: Oropharynx is clear and moist. No oropharyngeal exudate.  Eyes: Conjunctivae and EOM are normal. Pupils are equal, round, and reactive to light. Right eye exhibits no discharge. Left eye exhibits no discharge. No scleral icterus.  Neck: Normal range of motion. Neck supple.  Protuberant jaw, tracheostomy site is open, patent, no bleeding or drainage or mucus  Cardiovascular: Normal rate, regular rhythm, normal heart sounds and intact distal pulses.  Exam reveals no gallop and no friction rub.   No murmur heard. Pulmonary/Chest: Effort normal and breath sounds normal. No respiratory distress. He has no wheezes. He has no rales.  Abdominal: Soft. Bowel sounds are normal. He exhibits no distension and no mass. There is no tenderness.  Musculoskeletal: Normal range of motion. He exhibits no edema or tenderness.  Neurological: He is alert. Coordination normal.  Skin: Skin is warm and dry. No rash noted. No erythema.  Psychiatric: He has a normal mood and affect. His  behavior is normal.  Nursing note and vitals reviewed.   ED Course  TRACHEOSTOMY REPLACEMENT Date/Time: 03/07/2015 9:00 PM Performed by: Noemi Chapel Authorized by: Noemi Chapel Consent: The procedure was performed in an emergent situation. Verbal consent obtained. Consent given by: patient Patient understanding: patient states understanding of the procedure being performed Required items: required blood products, implants, devices, and special equipment available Patient identity confirmed: verbally with patient Time out: Immediately prior to procedure a "time out" was called to verify the correct patient, procedure, equipment, support staff and site/side marked as required. Indications: fell out Local anesthesia used: no Patient sedated: no Preparation: Patient was prepped and draped in the usual sterile fashion. Tube type: single cannula Tube cuff: cuffless Tube size: 6.0 mm Seldinger technique: Seldinger technique used Complications: bleeding Comments: Xray confirmed   (including critical care time) Labs Review Labs Reviewed - No data to display  Imaging Review Dg Chest Portable 1 View  03/07/2015  CLINICAL DATA:  Shortness of breath. Tracheostomy clogged by mucous plug. History of emphysema, throat cancer, type 2 diabetes. Former smoker. EXAM: PORTABLE CHEST 1 VIEW COMPARISON:  03/28/2014 FINDINGS: Shallow inspiration with linear atelectasis in the lung bases. Normal heart size. Normal pulmonary vascularity. No focal airspace disease or consolidation in the lungs. No blunting of costophrenic angles. No pneumothorax. Tracheostomy tube is present with tip measuring 3.2 cm above the carina. Incidental note of postoperative changes with plate and screw fixation of the right mandible. IMPRESSION: No evidence of active pulmonary disease. Shallow inspiration with atelectasis in the lung bases. Electronically Signed   By: Lucienne Capers M.D.   On: 03/07/2015 21:16   I have  personally reviewed and evaluated these images and lab results as part of my medical decision-making.    MDM   Final diagnoses:  Tracheostomy complication, unspecified tracheostomy complication    Initially the patient did not appear ill however after initial attempt to replace tracheostomy it was unable to do that, there was some bleeding, he required some suctioning. On second attempt a bougie was used to guide the trach into position successfully however there was some bleeding with this as well. I discussed the patient's care with Dr. Constance Holster who agrees that if the bleeding slows down and stops then no further intervention would be needed. Will keep him updated. The patient is oxygenating well after replacement of the trach and appears back to comfortable status.  Bleeding has completely stopped - 11:40 - will d/c home.  Noemi Chapel, MD 03/07/15 [phone]

## 2015-03-07 NOTE — ED Notes (Addendum)
Per EMS,Trach clogged out due to mucus plug. Did not want ems to attempt to reinsert. No respiratory distress/SOB. Unable to talk without trach.

## 2015-03-07 NOTE — Progress Notes (Signed)
RN called RT to bring a new trach to patient that had removed at home due to not being able to breath. Patient wrote on paper that he pulled it out and found dried mucus on the end of his trach tube. MD attempted to insert 6 XLTcfs shiley with no result. Patient started bleeding. RT suctioned. RT went and obtained a buji to help guide the trach in. Patient became agitated while we were trying to help him. He finally settled down. Another RT came to assist in the process. MD was able to get trach in and patient seemed to tolerate well. End tidal CO2 detector was hard to place due to patient neck anatomy.  RT suctioned trach with bright red blood still visible. RT suctioned again about 15 minutes after placing new trach and the secretions were tinged pink. RT will continue to monitor.

## 2015-03-07 NOTE — ED Notes (Signed)
MD and RT at bedside inserting trach

## 2015-03-07 NOTE — ED Notes (Signed)
Bed: [facility] Expected date:  Expected time:  Means of arrival:  Comments: EMS 65yo M trach came out

## 2015-03-09 ENCOUNTER — Telehealth: Payer: Self-pay | Admitting: *Deleted

## 2015-03-09 NOTE — Telephone Encounter (Signed)
Tried calling patient to schedule lab appointment. Voicemail picks up and states patient is unavailable to try call again later.

## 2015-03-09 NOTE — Telephone Encounter (Signed)
-----   Message from [name], NP sent at 03/09/2015 11:50 AM EST ----- Please have pt schedule appt for this lab work, it needs to be obtained  ----- Message -----    From: SYSTEM    Sent: 03/09/2015  12:05 AM      To: [name], NP

## 2015-03-11 NOTE — Telephone Encounter (Signed)
LMOM to return call.

## 2015-03-30 ENCOUNTER — Emergency Department (HOSPITAL_COMMUNITY)
Admission: EM | Admit: 2015-03-30 | Discharge: 2015-03-30 | Disposition: A | Discharge status: Home / Self Care | Payer: Medicare Other | Attending: Emergency Medicine | Admitting: Emergency Medicine

## 2015-03-30 ENCOUNTER — Emergency Department (HOSPITAL_COMMUNITY): Payer: Medicare Other

## 2015-03-30 ENCOUNTER — Encounter (HOSPITAL_COMMUNITY): Payer: Self-pay | Admitting: Emergency Medicine

## 2015-03-30 DIAGNOSIS — K219 Gastro-esophageal reflux disease without esophagitis: Secondary | ICD-10-CM | POA: Insufficient documentation

## 2015-03-30 DIAGNOSIS — Z87440 Personal history of urinary (tract) infections: Secondary | ICD-10-CM | POA: Diagnosis not present

## 2015-03-30 DIAGNOSIS — H9190 Unspecified hearing loss, unspecified ear: Secondary | ICD-10-CM | POA: Diagnosis not present

## 2015-03-30 DIAGNOSIS — F329 Major depressive disorder, single episode, unspecified: Secondary | ICD-10-CM | POA: Diagnosis not present

## 2015-03-30 DIAGNOSIS — J439 Emphysema, unspecified: Secondary | ICD-10-CM | POA: Insufficient documentation

## 2015-03-30 DIAGNOSIS — J9509 Other tracheostomy complication: Secondary | ICD-10-CM | POA: Diagnosis not present

## 2015-03-30 DIAGNOSIS — Z85819 Personal history of malignant neoplasm of unspecified site of lip, oral cavity, and pharynx: Secondary | ICD-10-CM | POA: Insufficient documentation

## 2015-03-30 DIAGNOSIS — G8929 Other chronic pain: Secondary | ICD-10-CM | POA: Diagnosis not present

## 2015-03-30 DIAGNOSIS — I10 Essential (primary) hypertension: Secondary | ICD-10-CM | POA: Diagnosis not present

## 2015-03-30 DIAGNOSIS — Z79899 Other long term (current) drug therapy: Secondary | ICD-10-CM | POA: Insufficient documentation

## 2015-03-30 DIAGNOSIS — Z87891 Personal history of nicotine dependence: Secondary | ICD-10-CM | POA: Diagnosis not present

## 2015-03-30 DIAGNOSIS — R0602 Shortness of breath: Secondary | ICD-10-CM

## 2015-03-30 DIAGNOSIS — E119 Type 2 diabetes mellitus without complications: Secondary | ICD-10-CM | POA: Insufficient documentation

## 2015-03-30 LAB — BASIC METABOLIC PANEL
ANION GAP: 12 (ref 5–15)
BUN: 16 mg/dL (ref 6–20)
CALCIUM: 9.6 mg/dL (ref 8.9–10.3)
CO2: 26 mmol/L (ref 22–32)
Chloride: 95 mmol/L — ABNORMAL LOW (ref 101–111)
Creatinine, Ser: 0.91 mg/dL (ref 0.61–1.24)
GFR calc Af Amer: >60 mL/min (ref >60)
GFR calc non Af Amer: >60 mL/min (ref >60)
GLUCOSE: 121 mg/dL — AB (ref 65–99)
POTASSIUM: 4.4 mmol/L (ref 3.5–5.1)
Sodium: 133 mmol/L — ABNORMAL LOW (ref 135–145)

## 2015-03-30 LAB — CBC WITH DIFFERENTIAL/PLATELET
Basophils Absolute: 0 10*3/uL (ref 0.0–0.1)
Basophils Relative: 0 %
EOS ABS: 0.1 10*3/uL (ref 0.0–0.7)
Eosinophils Relative: 1 %
HCT: 45.5 % (ref 39.0–52.0)
HEMOGLOBIN: 15.8 g/dL (ref 13.0–17.0)
LYMPHS ABS: 1 10*3/uL (ref 0.7–4.0)
Lymphocytes Relative: 20 %
MCH: 31.9 pg (ref 26.0–34.0)
MCHC: 34.7 g/dL (ref 30.0–36.0)
MCV: 91.7 fL (ref 78.0–100.0)
MONO ABS: 0.9 10*3/uL (ref 0.1–1.0)
MONOS PCT: 16 %
Neutro Abs: 3.2 10*3/uL (ref 1.7–7.7)
Neutrophils Relative %: 63 %
Platelets: 213 10*3/uL (ref 150–400)
RBC: 4.96 MIL/uL (ref 4.22–5.81)
RDW: 12.6 % (ref 11.5–15.5)
WBC: 5.2 10*3/uL (ref 4.0–10.5)

## 2015-03-30 LAB — BRAIN NATRIURETIC PEPTIDE: B NATRIURETIC PEPTIDE 5: 4.1 pg/mL (ref 0.0–100.0)

## 2015-03-30 LAB — I-STAT TROPONIN, ED: Troponin i, poc: 0 ng/mL (ref 0.00–0.08)

## 2015-03-30 NOTE — ED Notes (Signed)
Pt stable, states understanding of discharge instructions 

## 2015-03-30 NOTE — Discharge Instructions (Signed)
Tracheostomy °A tracheostomy is a surgical procedure to make an opening into the main airway that leads to your lungs (trachea). A tracheostomy tube (trach tube) is then placed in this opening so you can breathe without using your nose or mouth. A tracheostomy may be permanent or temporary.  °You may need to have a trach tube if:  °· Your airway is blocked by: °¨ Swelling. °¨ Injury. °¨ Tumor. °¨ A foreign object. °¨ A vocal cord problem. °¨ Severe narrowing of the trachea. °· You need long-term breathing assistance (ventilation). °· You have too much mucus or other fluids (secretions) in your airway that need to be suctioned often.   °LET YOUR HEALTH CARE PROVIDER KNOW ABOUT: °· Any allergies you have. °· All medicines you are taking, including vitamins, herbs, eye drops, creams, and over-the-counter medicines. °· Previous problems you or members of your family have had with the use of anesthetics. °· Any blood disorders you have. °· Previous surgeries you have had. °· Medical conditions you have. °RISKS AND COMPLICATIONS °Generally, tracheostomy is a safe procedure. However, problems can occur and include: °· Bleeding. °· Infection. °· Puncture of the lung or lungs (pneumothorax). °· Narrowing of the trachea, making it hard to breathe. °· Damage to the gland that sits over the lower part of your neck (thyroid). °BEFORE THE PROCEDURE °· Do not eat or drink anything after midnight on the night before the procedure or as directed by your health care provider. °· Ask your health care provider about: °¨ Changing or stopping your regular medicines. This is especially important if you are taking diabetes medicines or blood thinners. °¨ Taking medicines such as aspirin and ibuprofen. These medicines can thin your blood. Do not take these medicines before your procedure if your health care provider asks you not to. °PROCEDURE  °If your tracheostomy is planned, it will be done in an operating room. Or, you may have an  emergency procedure at your hospital bedside or in the emergency room. This is what may happen during the procedure: °· You may have an intravenous line (IV) put in your arm or hand. °¨ Medicine and fluids will flow through the IV. °¨ You may get antibiotic medicine through the IV. °· You will be given one of the following: °¨ A medicine that numbs only your neck (local anesthetic). °¨ A medicine that makes you fall asleep (general anesthetic). °· The front of your neck will be cleaned with a germ-killing solution (antiseptic). °· Sterile drapes will be placed around your neck area. °· After you are asleep or your neck is numb, the surgeon will make a cut (incision) just below your Adam's apple. °· The surgeon will separate the muscles in your neck to reach the front of your trachea. °· An incision or small window will be made into the front wall of your trachea. °· A breathing tube (tracheal tube) will be put through the opening and left in place. °· A cuff at the end of the tube will be inflated. °· Any bleeding will be controlled. °· The trach tube will be tied, taped, or sutured in place. °· A bandage (dressing) will be placed over the incision. °AFTER THE PROCEDURE °If the procedure was done in the operating room, you will stay in a recovery area until the anesthetic wears off. It may take several days to get used to living with a tracheostomy.  °· If you need breathing help, your tube will be connected to a breathing machine (ventilator). °·   A new tracheostomy produces a lot of secretions. You may need to have your tracheostomy suctioned and cleaned frequently by nurses. °· Before you go home, you and your caregivers will learn how to care for your tracheostomy, including how to: °¨ Clean your tracheostomy. °¨ Change the tube. °¨ Communicate. °¨ Eat safely. °¨ Protect your lungs. °  °This information is not intended to replace advice given to you by your health care provider. Make sure you discuss any  questions you have with your health care provider. °  °Document Released: 10/25/2000 Document Revised: 02/20/2014 Document Reviewed: 04/07/2013 °Elsevier Interactive Patient Education ©2016 [employer] ° °

## 2015-03-30 NOTE — ED Provider Notes (Signed)
CSN: [id number]     Arrival date & time 03/30/15  0209 History  By signing my name below, I, Nicholas Cobb, attest that this documentation has been prepared under the direction and in the presence of Nicholas Baton, MD. Electronically Signed: Bethel Cobb, ED Scribe. 03/30/2015. 3:33 AM   Chief Complaint  Patient presents with  . Tracheostomy Tube Change   The history is provided by the patient. The history is limited by a language barrier. No language interpreter was used.   Nicholas Cobb is a 65 y.o. male with history of throat cancer requiring permanent tracheostomy  who presents to the Emergency Department after his trach tube was displaced approximately 3 hours ago. Pt reports that the tube was clogged and then became dislodged. Associated symptoms include SOB. Pt denies chest pain.  His last ENT doctor was at [facility]. Patient is seen occasionally in the critical-care trach clinic for trach changes. He does not see an ENT doctor in town. He was seen on January 22 and had what appears to be a relatively traumatic tracheostomy tube exchange. ENT was consulted at that time regarding bleeding. Patient has not followed up.  Past Medical History  Diagnosis Date  . Hearing loss   . Change in voice   . Rash   . Trouble swallowing   . Difficulty urinating   . Cervical spondylosis 03/25/2011  . Emphysema 03/25/2011  . GERD (gastroesophageal reflux disease) 03/25/2011  . Chronic pain 03/25/2011  . 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 Xerostomia 03/25/2011  . Ulcer   . UTI (lower urinary tract infection)   . Allergy   . Chronic sinusitis 04/27/2011  . Impaired glucose tolerance 04/27/2011  . Cervical spondylolysis 04/30/2011    severe  . Cancer (HCC) 1999    throat cancer  . Essential hypertension, benign 12/03/2012  . Type II or unspecified type diabetes mellitus without mention of complication, uncontrolled 04/27/2011  . Depression    Past Surgical History  Procedure Laterality Date   . Peg tube placement  11/1999  . Tracheostomy  11/1999  . Gastrostomy tube placement     Family History  Problem Relation Age of Onset  . Stroke Other   . Diabetes Other   . Cancer Other     lung cancer  . Diabetes Mother   . Heart disease Father    Social History  Substance Use Topics  . Smoking status: Former Smoker -- 23 years    Quit date: 11/06/1996  . Smokeless tobacco: Never Used  . Alcohol Use: No    Review of Systems  Constitutional: Negative for fever.  HENT:       Displaced trach tube  Respiratory: Positive for shortness of breath. Negative for cough.   Cardiovascular: Negative for chest pain and leg swelling.  All other systems reviewed and are negative.   Allergies  Oxybutynin chloride and Codeine  Home Medications   Prior to Admission medications   Medication Sig Start Date End Date Taking? Authorizing Provider  AMBULATORY NON FORMULARY MEDICATION Feeding tube ( peg tube ) supplies 12/02/14   Kirt Boys, DO  AMBULATORY NON FORMULARY MEDICATION Trach (Shiley 6.0 XLT UP uncuffed) Trach Care Kit, Trach Holders, Disposable inner cannulas  Dx:C32.9 ,Z93.0 03/05/15   Kirt Boys, DO  cholestyramine light (PREVALITE) 4 G packet Take 1 packet (4 g total) by mouth 3 (three) times daily. 01/06/15   Kirt Boys, DO  ciprofloxacin (CIPRO) 500 MG tablet Take 1 tablet (500 mg total) by  mouth 2 (two) times daily. 02/26/15   Gildardo Cranker, DO  diphenoxylate-atropine (LOMOTIL) 2.5-0.025 MG tablet Take 1 tablet by mouth 4 (four) times daily as needed for diarrhea or loose stools. 12/02/14   Gildardo Cranker, DO  doxycycline (VIBRA-TABS) 100 MG tablet Take 1 tablet (100 mg total) by mouth 2 (two) times daily. 02/26/15   Gildardo Cranker, DO  hydrOXYzine (VISTARIL) 25 MG capsule Take 1 capsule (25 mg total) by mouth 3 (three) times daily as needed. 03/04/15   [name], NP  ibuprofen (ADVIL,MOTRIN) 400 MG tablet Take one tablet by mouth three times daily as needed for  pain 02/22/15   Tiffany L Reed, DO  losartan (COZAAR) 100 MG tablet Take 1 tablet (100 mg total) by mouth daily. 12/02/14   Monica Carter, DO  naphazoline-pheniramine (NAPHCON-A) 0.025-0.3 % ophthalmic solution Place 2 drops into both eyes 4 (four) times daily as needed for irritation (red eye).    Historical Provider, MD  Nutritional Supplements (FEEDING SUPPLEMENT, JEVITY 1.2 CAL,) LIQD Place 237 mLs into feeding tube See admin instructions. Pt takes 2 cans at breakfast, 2 cans at lunch, 2 cans at dinner and 2 at night 12/02/14   Gildardo Cranker, DO  oxymetazoline (12 HOUR NASAL SPRAY) 0.05 % nasal spray Place 2 sprays into the nose 2 (two) times daily as needed for congestion.     Historical Provider, MD  ranitidine (ZANTAC) 150 MG tablet Take 1 tablet (150 mg total) by mouth 2 (two) times daily. 01/22/15   [name], NP  tamsulosin (FLOMAX) 0.4 MG CAPS capsule Take 1 capsule (0.4 mg total) by mouth daily. 08/06/14   [name], NP  triamcinolone cream (KENALOG) 0.1 % Apply 1 application topically 2 (two) times daily. 02/17/15   [name], NP  zoster vaccine live, PF, (ZOSTAVAX) 29476 UNT/0.65ML injection Inject 19,400 Units into the skin once. Patient not taking: Reported on 02/26/2015 06/04/14   [name], NP   BP 132/85 mmHg  Pulse 96  Temp(Src) 98.3 F (36.8 C) (Oral)  Resp 16  Ht '5\' 10"'$  (1.778 m)  Wt 168 lb (76.204 kg)  BMI 24.11 kg/m2  SpO2 96% Physical Exam  Constitutional:  Chronically ill-appearing, right-sided facial swelling, gurgly breath sounds through stoma  Eyes: Pupils are equal, round, and reactive to light.  Neck:  Stoma appears patent, dry blood about stoma  Cardiovascular: Normal rate, regular rhythm and normal heart sounds.   No murmur heard. Pulmonary/Chest: Effort normal and breath sounds normal. No respiratory distress. He has no wheezes. He has no rales.  Abdominal: Soft. Bowel sounds are normal. There is no tenderness. There is no  rebound.  Musculoskeletal: He exhibits no edema.  Neurological: He is alert.  Skin: Skin is warm and dry.  Psychiatric: He has a normal mood and affect.  Nursing note and vitals reviewed.   ED Course  TRACHEOSTOMY REPLACEMENT Date/Time: 03/30/2015 6:05 AM Performed by: [name] Authorized by: [name] Consent: Verbal consent obtained. Risks and benefits: risks, benefits and alternatives were discussed Consent given by: patient Indications: obstruction Local anesthesia used: no Patient sedated: no Tube type: single cannula Tube cuff: cuffless Tube size: 6.0 mm Seldinger technique: Seldinger technique used Patient tolerance: Patient tolerated the procedure well with no immediate complications   (including critical care time) DIAGNOSTIC STUDIES: Oxygen Saturation is 96% on RA,  normal by my interpretation.    COORDINATION OF CARE: 3:16 AM Discussed treatment plan which includes lab work, CXR,  EKG with pt at bedside and pt agreed to plan.  Labs Review Labs Reviewed  BASIC METABOLIC PANEL - Abnormal; Notable for the following:    Sodium 133 (*)    Chloride 95 (*)    Glucose, Bld 121 (*)    All other components within normal limits  CBC WITH DIFFERENTIAL/PLATELET  BRAIN NATRIURETIC PEPTIDE  I-STAT TROPOININ, ED    Imaging Review Dg Chest Portable 1 View  03/30/2015  CLINICAL DATA:  Shortness of breath tonight.  Tracheostomy. EXAM: PORTABLE CHEST 1 VIEW COMPARISON:  03/07/2015 FINDINGS: Shallow inspiration. Normal heart size and pulmonary vascularity. No focal airspace disease or consolidation. No blunting of costophrenic angles. No pneumothorax. Postoperative changes in the right mandible and right apex. IMPRESSION: No active disease. Electronically Signed   By: Lucienne Capers M.D.   On: 03/30/2015 03:36   I personally reviewed and evaluated these images and lab results as a part of my medical decision-making.   EKG Interpretation   Date/Time:   Tuesday March 30 2015 03:30:42 EST Ventricular Rate:  101 PR Interval:  145 QRS Duration: 78 QT Interval:  347 QTC Calculation: 450 R Axis:   110 Text Interpretation:  Sinus tachycardia Consider right atrial enlargement  Probable inferior infarct, old Probable anterolateral infarct, old No  significant change since last tracing Confirmed by HORTON  MD, COURTNEY  ([id number]) on 03/30/2015 4:43:58 AM      MDM   Final diagnoses:  SOB (shortness of breath)  Other tracheostomy complication [facility])    Patient presents requesting tracheostomy change after obstruction. Tracheostomy has been out for greater than 3 hours. He has dried blood about the site. He previously had a dramatic change over. He is also complaining of shortness of breath. He is in no acute distress. Basic labwork obtained as well as a chest x-ray. Multiple attempts by RT were in successful in replacing the trach. Discussed with Dr. Redmond Baseman, ENT as patient appears to be bleeding some from the stoma with these attempts. He is requested that I try. Boogie was used for placement. Lurline Idol was placed with steady force. Some bleeding noted post trach placement which stopped. Patient's lab work and chest x-ray reassuring. He is requesting discharge. We'll have him follow-up in trach clinic.  After history, exam, and medical workup I feel the patient has been appropriately medically screened and is safe for discharge home. Pertinent diagnoses were discussed with the patient. Patient was given return precautions.  I personally performed the services described in this documentation, which was scribed in my presence. The recorded information has been reviewed and is accurate.   [name], MD 03/30/15 [phone]

## 2015-03-30 NOTE — Progress Notes (Signed)
Called to pt room to reinsert trach. Pt had been in the waiting room for 1 1/2 hours with trach out. Called OR to send #6 XL uncuffed shiley. Attempted to reinsert X 2 with no success. Md notified to call EMT.

## 2015-03-30 NOTE — ED Notes (Addendum)
Pt unable to verbally respond. Pt is communicating effectively with through the use of writing with pen an paper.  Respiratory is at bedside.   Awaiting trach to arrive to ED

## 2015-03-30 NOTE — ED Notes (Signed)
Pt wrote notes in his notebook (his form of communication) stating he had a bad experience here, that [facility] wasn't listening to him regarding his trach care.  That we don't offer a mask like most hospitals do to keep the trach moist.  This nurse apologized that he had a bad experience, and offered to call the doctor or respiratory to come back and go over things with him.  Pt refused, just said he was going to give a bad survey.

## 2015-03-30 NOTE — ED Notes (Signed)
RT at bedside.

## 2015-03-30 NOTE — ED Notes (Addendum)
Pt states in written form that "I can not wait to tell all my negative complaints." Pt requesting his trach collar to be changed due to having blood on it. Pt inform that the RT was in another room with a critical pt and he would be in when he finished.  Pt ambulatory out of room to the Nurses station after being discharged and states (in writing) that he was left without the call light to call the Nurse. This RN walked with the pt back to his room and showed the pt that the call light was attached to the bed rail and that someone (Not this RN) put the bed rail down.   Pt currently standing in the middle of the hall with his arms crossed. Pt redirected back into his room by Charge RN and informed that RT was with a critical pt and that he would be in as soon as he finished.

## 2015-03-30 NOTE — ED Notes (Addendum)
Pt updated on delay of care and informed that we were still waiting on the BNP lab test to result. Pt also informed that the reason he is hooked up to the monitor is due to his chief complaint of breathing problems so that I can continue to monitor him from outside of the room.   Pt displeased with the amount of time that he had to wait in the lobby to be seen. Pt also displeased with the fact that "no one come in my room for an hour or more." Pt informed that this RN had just returned from lunch recently and had to assess her other pts. Pt also reminded that the doctor was in the room recently and had put his trach back in.  Pt reassured several occasions prior to his trach being reinserted that his oxygen saturation was between 96%-98% on RA.   Pt states (in written form) that he cannot unlock his trach tube in order to be able to remove his inner cannula. RT informed of pt concerns. RT refused to return to the pt room. Charge RN informed. MD informed. MD states she is unaware of how to replace it.

## 2015-03-30 NOTE — ED Notes (Signed)
Pt. reported his tracheostomy tube clogged this evening , pt. showed RN dried mucus at end of his tracheostomy tube , respirations unlabored / O2 sat=98% room air.

## 2015-04-21 ENCOUNTER — Other Ambulatory Visit: Payer: Self-pay | Admitting: *Deleted

## 2015-04-21 MED ORDER — IBUPROFEN 400 MG PO TABS: ORAL_TABLET | ORAL | Status: DC

## 2015-04-21 NOTE — Telephone Encounter (Signed)
[facility] 

## 2015-05-11 ENCOUNTER — Telehealth: Payer: Self-pay | Admitting: *Deleted

## 2015-05-11 NOTE — Telephone Encounter (Signed)
Received fax from [place] [id number] Fax: [phone] Javier Glazier (Education officer, museum) requesting Application for CAP/DA to be filled out and signed and faxed back. Printed OV note and face sheet and given to Dr. Eulas Post to review and sign.

## 2015-05-12 ENCOUNTER — Other Ambulatory Visit: Payer: Self-pay

## 2015-05-12 MED ORDER — RANITIDINE HCL 150 MG PO TABS: 150.0000 mg | ORAL_TABLET | Freq: Two times a day (BID) | ORAL | Status: DC

## 2015-05-17 ENCOUNTER — Other Ambulatory Visit: Payer: Self-pay | Admitting: *Deleted

## 2015-05-17 MED ORDER — RANITIDINE HCL 150 MG PO TABS: 150.0000 mg | ORAL_TABLET | Freq: Two times a day (BID) | ORAL | Status: DC

## 2015-05-17 NOTE — Telephone Encounter (Signed)
[facility] 

## 2015-06-09 ENCOUNTER — Ambulatory Visit (INDEPENDENT_AMBULATORY_CARE_PROVIDER_SITE_OTHER): Payer: Medicare Other | Admitting: Internal Medicine

## 2015-06-09 ENCOUNTER — Encounter: Payer: Self-pay | Admitting: Internal Medicine

## 2015-06-09 VITALS — BP 118/76 | HR 79 | Temp 99.2°F | Resp 20 | Ht 69.0 in | Wt 169.8 lb

## 2015-06-09 DIAGNOSIS — Y842 Radiological procedure and radiotherapy as the cause of abnormal reaction of the patient, or of later complication, without mention of misadventure at the time of the procedure: Principal | ICD-10-CM

## 2015-06-09 DIAGNOSIS — I10 Essential (primary) hypertension: Secondary | ICD-10-CM

## 2015-06-09 DIAGNOSIS — C329 Malignant neoplasm of larynx, unspecified: Secondary | ICD-10-CM | POA: Diagnosis not present

## 2015-06-09 DIAGNOSIS — I890 Lymphedema, not elsewhere classified: Secondary | ICD-10-CM | POA: Diagnosis not present

## 2015-06-09 DIAGNOSIS — Z931 Gastrostomy status: Secondary | ICD-10-CM | POA: Diagnosis not present

## 2015-06-09 DIAGNOSIS — Z930 Tracheostomy status: Secondary | ICD-10-CM

## 2015-06-09 DIAGNOSIS — M272 Inflammatory conditions of jaws: Secondary | ICD-10-CM | POA: Diagnosis not present

## 2015-06-09 DIAGNOSIS — L03211 Cellulitis of face: Secondary | ICD-10-CM | POA: Diagnosis not present

## 2015-06-09 DIAGNOSIS — K219 Gastro-esophageal reflux disease without esophagitis: Secondary | ICD-10-CM

## 2015-06-09 NOTE — Patient Instructions (Signed)
Continue current medications as ordered  Keep appt with trach clinic as scheduled  Form completed for CAP/PACE program  Follow up in May as scheduled for routine visit

## 2015-06-09 NOTE — Progress Notes (Signed)
Patient ID: Nicholas Cobb, male   DOB: 1950-09-20, 65 y.o.   MRN: [id number]    Location:    [facility]   Place of Service:   OFFICE  Chief Complaint  Patient presents with  . Medical Management of Chronic Issues    Patients wants forms filled out for Medicaid     HPI:  65 yo male seen today for f/u.  He has a detailed form that needs to be completed in order for him to receive CAP services. He needs help with light house chores and running errands.  He as a hx laryngeal CA s/p radical neck sx, XRT; hx osteoradionecrosis of jaw. He has ben tx for facial celluitis in the past following cancer tx. He has dysphagia and is unable to open mouth due to osteoradionecrosis. He has a Peg and trach   Past Medical History  Diagnosis Date  . Hearing loss   . Change in voice   . Rash   . Trouble swallowing   . Difficulty urinating   . Cervical spondylosis 03/25/2011  . Emphysema 03/25/2011  . GERD (gastroesophageal reflux disease) 03/25/2011  . Chronic pain 03/25/2011  . 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 Xerostomia 03/25/2011  . Ulcer   . UTI (lower urinary tract infection)   . Allergy   . Chronic sinusitis 04/27/2011  . Impaired glucose tolerance 04/27/2011  . Cervical spondylolysis 04/30/2011    severe  . Cancer ([place]) 1999    throat cancer  . Essential hypertension, benign 12/03/2012  . Type II or unspecified type diabetes mellitus without mention of complication, uncontrolled 04/27/2011  . Depression     Past Surgical History  Procedure Laterality Date  . Peg tube placement  11/1999  . Tracheostomy  11/1999  . Gastrostomy tube placement      Patient Care Team: [name], NP as PCP - General (Nurse Practitioner)  Social History   Social History  . Marital Status: Single    Spouse Name: N/A  . Number of Children: N/A  . Years of Education: 12   Occupational History  . retired    Social History Main Topics  . Smoking status: Former Smoker -- 23 years    Quit date:  11/06/1996  . Smokeless tobacco: Never Used  . Alcohol Use: No  . Drug Use: No  . Sexual Activity: Not on file   Other Topics Concern  . Not on file   Social History Narrative   Pt. Lives in a apartment, 1 story, 2 people live in the home, No pet's. Pt. Does exercise (walk) and yes pt. Has a living will.     reports that he quit smoking about 18 years ago. He has never used smokeless tobacco. He reports that he does not drink alcohol or use illicit drugs.  Allergies  Allergen Reactions  . Oxybutynin Chloride Nausea And Vomiting  . Codeine Nausea And Vomiting and Rash    Medications: Patient's Medications  New Prescriptions   No medications on file  Previous Medications   AMBULATORY NON FORMULARY MEDICATION    Feeding tube ( peg tube ) supplies   AMBULATORY NON FORMULARY MEDICATION    Trach (Shiley 6.0 XLT UP uncuffed) Trach Care Kit, Trach Holders, Disposable inner cannulas  Dx:C32.9 ,Z93.0   CHOLESTYRAMINE LIGHT (PREVALITE) 4 G PACKET    Take 1 packet (4 g total) by mouth 3 (three) times daily.   CIPROFLOXACIN (CIPRO) 500 MG TABLET    Take 1 tablet (500 mg  total) by mouth 2 (two) times daily.   DIPHENOXYLATE-ATROPINE (LOMOTIL) 2.5-0.025 MG TABLET    Take 1 tablet by mouth 4 (four) times daily as needed for diarrhea or loose stools.   DOXYCYCLINE (VIBRA-TABS) 100 MG TABLET    Take 1 tablet (100 mg total) by mouth 2 (two) times daily.   HYDROXYZINE (VISTARIL) 25 MG CAPSULE    Take 1 capsule (25 mg total) by mouth 3 (three) times daily as needed.   IBUPROFEN (ADVIL,MOTRIN) 400 MG TABLET    Take one tablet by mouth three times daily as needed for pain   LOSARTAN (COZAAR) 100 MG TABLET    Take 1 tablet (100 mg total) by mouth daily.   NAPHAZOLINE-PHENIRAMINE (NAPHCON-A) 0.025-0.3 % OPHTHALMIC SOLUTION    Place 2 drops into both eyes 4 (four) times daily as needed for irritation (red eye).   NUTRITIONAL SUPPLEMENTS (FEEDING SUPPLEMENT, JEVITY 1.2 CAL,) LIQD    Place 237 mLs into  feeding tube See admin instructions. Pt takes 2 cans at breakfast, 2 cans at lunch, 2 cans at dinner and 2 at night   OXYMETAZOLINE (12 HOUR NASAL SPRAY) 0.05 % NASAL SPRAY    Place 2 sprays into the nose 2 (two) times daily as needed for congestion.    RANITIDINE (ZANTAC) 150 MG TABLET    Take 1 tablet (150 mg total) by mouth 2 (two) times daily.   TAMSULOSIN (FLOMAX) 0.4 MG CAPS CAPSULE    Take 1 capsule (0.4 mg total) by mouth daily.   TRIAMCINOLONE CREAM (KENALOG) 0.1 %    Apply 1 application topically 2 (two) times daily.   ZOSTER VACCINE LIVE, PF, (ZOSTAVAX) 24401 UNT/0.65ML INJECTION    Inject 19,400 Units into the skin once.  Modified Medications   No medications on file  Discontinued Medications   No medications on file    Review of Systems  Unable to perform ROS: Patient nonverbal    Filed Vitals:   06/09/15 1332  BP: 118/76  Pulse: 79  Temp: 99.2 F (37.3 C)  TempSrc: Oral  Resp: 20  Height: _0  (1.753 m)  Weight: 169 lb 12.8 oz (77.021 kg)  SpO2: 94%   Body mass index is 25.06 kg/(m^2).  Physical Exam  Constitutional: He appears well-developed. No distress.  Frail appearing in NAD  HENT:  Excessive drooling present  Neck:  Trach intact, no purulent drainage  Musculoskeletal:  R>L facial edema  Neurological: He is alert.  Skin: Skin is warm and dry. No rash noted.  Psychiatric: His behavior is normal. His affect is angry.     Labs reviewed: Admission on 03/30/2015, Discharged on 03/30/2015  Component Date Value Ref Range Status  . WBC 03/30/2015 5.2  4.0 - 10.5 K/uL Final  . RBC 03/30/2015 4.96  4.22 - 5.81 MIL/uL Final  . Hemoglobin 03/30/2015 15.8  13.0 - 17.0 g/dL Final  . HCT 03/30/2015 45.5  39.0 - 52.0 % Final  . MCV 03/30/2015 91.7  78.0 - 100.0 fL Final  . MCH 03/30/2015 31.9  26.0 - 34.0 pg Final  . MCHC 03/30/2015 34.7  30.0 - 36.0 g/dL Final  . RDW 03/30/2015 12.6  11.5 - 15.5 % Final  . Platelets 03/30/2015 213  150 - 400 K/uL Final  .  Neutrophils Relative % 03/30/2015 63   Final  . Neutro Abs 03/30/2015 3.2  1.7 - 7.7 K/uL Final  . Lymphocytes Relative 03/30/2015 20   Final  . Lymphs Abs 03/30/2015 1.0  0.7 - 4.0  K/uL Final  . Monocytes Relative 03/30/2015 16   Final  . Monocytes Absolute 03/30/2015 0.9  0.1 - 1.0 K/uL Final  . Eosinophils Relative 03/30/2015 1   Final  . Eosinophils Absolute 03/30/2015 0.1  0.0 - 0.7 K/uL Final  . Basophils Relative 03/30/2015 0   Final  . Basophils Absolute 03/30/2015 0.0  0.0 - 0.1 K/uL Final  . B Natriuretic Peptide 03/30/2015 4.1  0.0 - 100.0 pg/mL Final  . Sodium 03/30/2015 133* 135 - 145 mmol/L Final  . Potassium 03/30/2015 4.4  3.5 - 5.1 mmol/L Final  . Chloride 03/30/2015 95* 101 - 111 mmol/L Final  . CO2 03/30/2015 26  22 - 32 mmol/L Final  . Glucose, Bld 03/30/2015 121* 65 - 99 mg/dL Final  . BUN 03/30/2015 16  6 - 20 mg/dL Final  . Creatinine, Ser 03/30/2015 0.91  0.61 - 1.24 mg/dL Final  . Calcium 03/30/2015 9.6  8.9 - 10.3 mg/dL Final  . GFR calc non Af Amer 03/30/2015 >60  >60 mL/min Final  . GFR calc Af Amer 03/30/2015 >60  >60 mL/min Final   Comment: (NOTE) The eGFR has been calculated using the CKD EPI equation. This calculation has not been validated in all clinical situations. eGFR's persistently <60 mL/min signify possible Chronic Kidney Disease.   . Anion gap 03/30/2015 12  5 - 15 Final  . Troponin i, poc 03/30/2015 0.00  0.00 - 0.08 ng/mL Final  . Comment 3 03/30/2015          Final   Comment: Due to the release kinetics of cTnI, a negative result within the first hours of the onset of symptoms does not rule out myocardial infarction with certainty. If myocardial infarction is still suspected, repeat the test at appropriate intervals.     No results found.   Assessment/Plan   ICD-9-CM ICD-10-CM   1. Osteoradionecrosis of jaw 526.89 M27.2    E926.9    2. Lymphedema of face 457.1 I89.0   3. Tracheostomy in place [facility]) V44.0 Z93.0   4. Cellulitis  of face - recurrent 682.0 L03.211   5. S/P percutaneous endoscopic gastrostomy (PEG) tube placement (HCC) V44.1 Z93.1   6. Gastroesophageal reflux disease without esophagitis 530.81 K21.9   7. Essential hypertension, benign 401.1 I10   8. Laryngeal cancer ([place]) 161.9 C32.9     Continue current medications as ordered  Keep appt with trach clinic as scheduled  Form completed for CAP/PACE program  Follow up in May as scheduled for routine visit  45 minutes spent with pt today with >50% of time coordinating care  [place]. Perlie Gold  [facility] [address] [place], [place] [address] [phone] Cell (Monday-Friday 8 AM - 5 PM) [phone] After 5 PM and follow prompts

## 2015-06-13 ENCOUNTER — Emergency Department (HOSPITAL_COMMUNITY): Payer: Medicare Other

## 2015-06-13 ENCOUNTER — Inpatient Hospital Stay (HOSPITAL_COMMUNITY)
Admission: EM | Admit: 2015-06-13 | Discharge: 2015-06-15 | DRG: 208 | Disposition: A | Discharge status: Home / Self Care | Payer: Medicare Other | Attending: Internal Medicine | Admitting: Internal Medicine

## 2015-06-13 ENCOUNTER — Encounter (HOSPITAL_COMMUNITY): Payer: Self-pay | Admitting: Oncology

## 2015-06-13 DIAGNOSIS — J9690 Respiratory failure, unspecified, unspecified whether with hypoxia or hypercapnia: Secondary | ICD-10-CM | POA: Diagnosis present

## 2015-06-13 DIAGNOSIS — G8929 Other chronic pain: Secondary | ICD-10-CM | POA: Diagnosis present

## 2015-06-13 DIAGNOSIS — I10 Essential (primary) hypertension: Secondary | ICD-10-CM | POA: Diagnosis present

## 2015-06-13 DIAGNOSIS — J329 Chronic sinusitis, unspecified: Secondary | ICD-10-CM | POA: Diagnosis present

## 2015-06-13 DIAGNOSIS — Z85819 Personal history of malignant neoplasm of unspecified site of lip, oral cavity, and pharynx: Secondary | ICD-10-CM | POA: Diagnosis not present

## 2015-06-13 DIAGNOSIS — H9190 Unspecified hearing loss, unspecified ear: Secondary | ICD-10-CM | POA: Diagnosis present

## 2015-06-13 DIAGNOSIS — J189 Pneumonia, unspecified organism: Secondary | ICD-10-CM

## 2015-06-13 DIAGNOSIS — N170 Acute kidney failure with tubular necrosis: Secondary | ICD-10-CM | POA: Diagnosis not present

## 2015-06-13 DIAGNOSIS — Z79899 Other long term (current) drug therapy: Secondary | ICD-10-CM | POA: Diagnosis not present

## 2015-06-13 DIAGNOSIS — Y848 Other medical procedures as the cause of abnormal reaction of the patient, or of later complication, without mention of misadventure at the time of the procedure: Secondary | ICD-10-CM | POA: Diagnosis not present

## 2015-06-13 DIAGNOSIS — E119 Type 2 diabetes mellitus without complications: Secondary | ICD-10-CM | POA: Diagnosis present

## 2015-06-13 DIAGNOSIS — N179 Acute kidney failure, unspecified: Secondary | ICD-10-CM | POA: Diagnosis present

## 2015-06-13 DIAGNOSIS — Z888 Allergy status to other drugs, medicaments and biological substances status: Secondary | ICD-10-CM

## 2015-06-13 DIAGNOSIS — K219 Gastro-esophageal reflux disease without esophagitis: Secondary | ICD-10-CM | POA: Diagnosis present

## 2015-06-13 DIAGNOSIS — Z833 Family history of diabetes mellitus: Secondary | ICD-10-CM

## 2015-06-13 DIAGNOSIS — Z8249 Family history of ischemic heart disease and other diseases of the circulatory system: Secondary | ICD-10-CM | POA: Diagnosis not present

## 2015-06-13 DIAGNOSIS — I959 Hypotension, unspecified: Secondary | ICD-10-CM | POA: Diagnosis not present

## 2015-06-13 DIAGNOSIS — J9601 Acute respiratory failure with hypoxia: Secondary | ICD-10-CM

## 2015-06-13 DIAGNOSIS — I890 Lymphedema, not elsewhere classified: Secondary | ICD-10-CM | POA: Diagnosis present

## 2015-06-13 DIAGNOSIS — J9600 Acute respiratory failure, unspecified whether with hypoxia or hypercapnia: Secondary | ICD-10-CM | POA: Diagnosis not present

## 2015-06-13 DIAGNOSIS — Z931 Gastrostomy status: Secondary | ICD-10-CM

## 2015-06-13 DIAGNOSIS — Z87891 Personal history of nicotine dependence: Secondary | ICD-10-CM | POA: Diagnosis not present

## 2015-06-13 DIAGNOSIS — Z885 Allergy status to narcotic agent status: Secondary | ICD-10-CM | POA: Diagnosis not present

## 2015-06-13 DIAGNOSIS — G903 Multi-system degeneration of the autonomic nervous system: Secondary | ICD-10-CM | POA: Diagnosis not present

## 2015-06-13 DIAGNOSIS — J440 Chronic obstructive pulmonary disease with acute lower respiratory infection: Principal | ICD-10-CM | POA: Diagnosis present

## 2015-06-13 DIAGNOSIS — Z823 Family history of stroke: Secondary | ICD-10-CM

## 2015-06-13 DIAGNOSIS — R0602 Shortness of breath: Secondary | ICD-10-CM | POA: Diagnosis present

## 2015-06-13 DIAGNOSIS — M272 Inflammatory conditions of jaws: Secondary | ICD-10-CM | POA: Diagnosis present

## 2015-06-13 DIAGNOSIS — J9610 Chronic respiratory failure, unspecified whether with hypoxia or hypercapnia: Secondary | ICD-10-CM | POA: Diagnosis not present

## 2015-06-13 DIAGNOSIS — J9621 Acute and chronic respiratory failure with hypoxia: Secondary | ICD-10-CM

## 2015-06-13 LAB — CBC WITH DIFFERENTIAL/PLATELET
BASOS ABS: 0 10*3/uL (ref 0.0–0.1)
Basophils Relative: 0 %
EOS PCT: 2 %
Eosinophils Absolute: 0.2 10*3/uL (ref 0.0–0.7)
HCT: 44.4 % (ref 39.0–52.0)
Hemoglobin: 14.7 g/dL (ref 13.0–17.0)
LYMPHS PCT: 36 %
Lymphs Abs: 3.3 10*3/uL (ref 0.7–4.0)
MCH: 30.9 pg (ref 26.0–34.0)
MCHC: 33.1 g/dL (ref 30.0–36.0)
MCV: 93.5 fL (ref 78.0–100.0)
MONO ABS: 1.5 10*3/uL — AB (ref 0.1–1.0)
MONOS PCT: 17 %
Neutro Abs: 4.1 10*3/uL (ref 1.7–7.7)
Neutrophils Relative %: 45 %
Platelets: 253 10*3/uL (ref 150–400)
RBC: 4.75 MIL/uL (ref 4.22–5.81)
RDW: 12.8 % (ref 11.5–15.5)
WBC: 9.1 10*3/uL (ref 4.0–10.5)

## 2015-06-13 LAB — URINALYSIS, ROUTINE W REFLEX MICROSCOPIC
Bilirubin Urine: NEGATIVE
GLUCOSE, UA: NEGATIVE mg/dL
Hgb urine dipstick: NEGATIVE
Ketones, ur: NEGATIVE mg/dL
LEUKOCYTES UA: NEGATIVE
NITRITE: NEGATIVE
PROTEIN: 100 mg/dL — AB
Specific Gravity, Urine: 1.015 (ref 1.005–1.030)
pH: 7 (ref 5.0–8.0)

## 2015-06-13 LAB — COMPREHENSIVE METABOLIC PANEL
ALT: 27 U/L (ref 17–63)
ANION GAP: 14 (ref 5–15)
AST: 37 U/L (ref 15–41)
Albumin: 4.2 g/dL (ref 3.5–5.0)
Alkaline Phosphatase: 113 U/L (ref 38–126)
BUN: 17 mg/dL (ref 6–20)
CHLORIDE: 99 mmol/L — AB (ref 101–111)
CO2: 21 mmol/L — AB (ref 22–32)
Calcium: 8.8 mg/dL — ABNORMAL LOW (ref 8.9–10.3)
Creatinine, Ser: 1.27 mg/dL — ABNORMAL HIGH (ref 0.61–1.24)
GFR calc non Af Amer: 57 mL/min — ABNORMAL LOW (ref >60)
Glucose, Bld: 202 mg/dL — ABNORMAL HIGH (ref 65–99)
Potassium: 4 mmol/L (ref 3.5–5.1)
SODIUM: 134 mmol/L — AB (ref 135–145)
Total Bilirubin: 0.4 mg/dL (ref 0.3–1.2)
Total Protein: 7.8 g/dL (ref 6.5–8.1)

## 2015-06-13 LAB — CBC
HCT: 37 % — ABNORMAL LOW (ref 39.0–52.0)
Hemoglobin: 12.7 g/dL — ABNORMAL LOW (ref 13.0–17.0)
MCH: 31.1 pg (ref 26.0–34.0)
MCHC: 34.3 g/dL (ref 30.0–36.0)
MCV: 90.5 fL (ref 78.0–100.0)
PLATELETS: 186 10*3/uL (ref 150–400)
RBC: 4.09 MIL/uL — ABNORMAL LOW (ref 4.22–5.81)
RDW: 12.8 % (ref 11.5–15.5)
WBC: 8.2 10*3/uL (ref 4.0–10.5)

## 2015-06-13 LAB — PROCALCITONIN: Procalcitonin: <0.1 ng/mL

## 2015-06-13 LAB — BASIC METABOLIC PANEL
ANION GAP: 10 (ref 5–15)
BUN: 17 mg/dL (ref 6–20)
CALCIUM: 8.2 mg/dL — AB (ref 8.9–10.3)
CO2: 21 mmol/L — ABNORMAL LOW (ref 22–32)
Chloride: 104 mmol/L (ref 101–111)
Creatinine, Ser: 0.99 mg/dL (ref 0.61–1.24)
GLUCOSE: 95 mg/dL (ref 65–99)
Potassium: 5.3 mmol/L — ABNORMAL HIGH (ref 3.5–5.1)
Sodium: 135 mmol/L (ref 135–145)

## 2015-06-13 LAB — BLOOD GAS, ARTERIAL
Acid-base deficit: 3 mmol/L — ABNORMAL HIGH (ref 0.0–2.0)
BICARBONATE: 22.3 meq/L (ref 20.0–24.0)
Drawn by: 422461
FIO2: 1
LHR: 20 {breaths}/min
O2 Saturation: 99.4 %
PEEP: 5 cmH2O
PO2 ART: 323 mmHg — AB (ref 80.0–100.0)
Patient temperature: 98
TCO2: 20.3 mmol/L (ref 0–100)
VT: 600 mL
pCO2 arterial: 42.4 mmHg (ref 35.0–45.0)
pH, Arterial: 7.338 — ABNORMAL LOW (ref 7.350–7.450)

## 2015-06-13 LAB — URINE MICROSCOPIC-ADD ON
Bacteria, UA: NONE SEEN
RBC / HPF: NONE SEEN RBC/hpf (ref 0–5)

## 2015-06-13 LAB — MRSA PCR SCREENING: MRSA by PCR: NEGATIVE

## 2015-06-13 LAB — LIPASE, BLOOD: LIPASE: 26 U/L (ref 11–51)

## 2015-06-13 LAB — TRIGLYCERIDES: TRIGLYCERIDES: 120 mg/dL (ref <150)

## 2015-06-13 LAB — GLUCOSE, CAPILLARY
GLUCOSE-CAPILLARY: 84 mg/dL (ref 65–99)
GLUCOSE-CAPILLARY: 121 mg/dL — AB (ref 65–99)
GLUCOSE-CAPILLARY: 92 mg/dL (ref 65–99)
Glucose-Capillary: 91 mg/dL (ref 65–99)

## 2015-06-13 LAB — BRAIN NATRIURETIC PEPTIDE: B NATRIURETIC PEPTIDE 5: 99.7 pg/mL (ref 0.0–100.0)

## 2015-06-13 LAB — MAGNESIUM: Magnesium: 2 mg/dL (ref 1.7–2.4)

## 2015-06-13 LAB — PHOSPHORUS: Phosphorus: 2.9 mg/dL (ref 2.5–4.6)

## 2015-06-13 LAB — STREP PNEUMONIAE URINARY ANTIGEN: STREP PNEUMO URINARY ANTIGEN: NEGATIVE

## 2015-06-13 LAB — LACTIC ACID, PLASMA: LACTIC ACID, VENOUS: 2.2 mmol/L — AB (ref 0.5–2.0)

## 2015-06-13 LAB — TROPONIN I: Troponin I: <0.03 ng/mL (ref <0.031)

## 2015-06-13 MED ORDER — MIDAZOLAM HCL 2 MG/2ML IJ SOLN
2.0000 mg | Freq: Once | INTRAMUSCULAR | Status: AC
  Administered 2015-06-13: 2 mg via INTRAVENOUS

## 2015-06-13 MED ORDER — JEVITY 1.2 CAL PO LIQD
474.0000 mL | Freq: Three times a day (TID) | ORAL | Status: DC
  Filled 2015-06-13: qty 474

## 2015-06-13 MED ORDER — DEXTROSE 5 % IV SOLN
500.0000 mg | INTRAVENOUS | Status: DC
  Administered 2015-06-13 – 2015-06-14 (×2): 500 mg via INTRAVENOUS
  Filled 2015-06-13 (×2): qty 500

## 2015-06-13 MED ORDER — VANCOMYCIN HCL IN DEXTROSE 750-5 MG/150ML-% IV SOLN
750.0000 mg | Freq: Two times a day (BID) | INTRAVENOUS | Status: DC
  Filled 2015-06-13: qty 150

## 2015-06-13 MED ORDER — VANCOMYCIN HCL IN DEXTROSE 1-5 GM/200ML-% IV SOLN
1000.0000 mg | Freq: Once | INTRAVENOUS | Status: AC
  Administered 2015-06-13: 1000 mg via INTRAVENOUS
  Filled 2015-06-13: qty 200

## 2015-06-13 MED ORDER — DEXTROSE 5 % IV SOLN
2.0000 g | INTRAVENOUS | Status: DC
  Administered 2015-06-13: 2 g via INTRAVENOUS
  Filled 2015-06-13 (×3): qty 2

## 2015-06-13 MED ORDER — PROPOFOL 1000 MG/100ML IV EMUL
5.0000 ug/kg/min | INTRAVENOUS | Status: DC
  Administered 2015-06-13: 40 ug/kg/min via INTRAVENOUS
  Filled 2015-06-13: qty 100

## 2015-06-13 MED ORDER — SODIUM CHLORIDE 0.9 % IV BOLUS (SEPSIS)
500.0000 mL | Freq: Once | INTRAVENOUS | Status: AC
  Administered 2015-06-13: 500 mL via INTRAVENOUS

## 2015-06-13 MED ORDER — CHLORHEXIDINE GLUCONATE 0.12 % MT SOLN
15.0000 mL | Freq: Two times a day (BID) | OROMUCOSAL | Status: DC
  Administered 2015-06-13 – 2015-06-14 (×4): 15 mL via OROMUCOSAL
  Filled 2015-06-13 (×2): qty 15

## 2015-06-13 MED ORDER — MIDAZOLAM HCL 2 MG/2ML IJ SOLN
INTRAMUSCULAR | Status: AC
  Filled 2015-06-13: qty 8

## 2015-06-13 MED ORDER — SODIUM CHLORIDE 0.9 % IV SOLN: 250.0000 mL | INTRAVENOUS | Status: DC | PRN

## 2015-06-13 MED ORDER — LABETALOL HCL 5 MG/ML IV SOLN
10.0000 mg | INTRAVENOUS | Status: DC | PRN
  Administered 2015-06-14 (×2): 10 mg via INTRAVENOUS
  Filled 2015-06-13 (×4): qty 4

## 2015-06-13 MED ORDER — INSULIN ASPART 100 UNIT/ML ~~LOC~~ SOLN
0.0000 [IU] | SUBCUTANEOUS | Status: DC
  Administered 2015-06-13 – 2015-06-15 (×4): 2 [IU] via SUBCUTANEOUS

## 2015-06-13 MED ORDER — PIPERACILLIN-TAZOBACTAM 3.375 G IVPB 30 MIN
3.3750 g | Freq: Once | INTRAVENOUS | Status: AC
  Administered 2015-06-13: 3.375 g via INTRAVENOUS
  Filled 2015-06-13: qty 50

## 2015-06-13 MED ORDER — PANTOPRAZOLE SODIUM 40 MG IV SOLR
40.0000 mg | Freq: Every day | INTRAVENOUS | Status: DC
  Administered 2015-06-13 – 2015-06-14 (×2): 40 mg via INTRAVENOUS
  Filled 2015-06-13 (×2): qty 40

## 2015-06-13 MED ORDER — CETYLPYRIDINIUM CHLORIDE 0.05 % MT LIQD
7.0000 mL | Freq: Two times a day (BID) | OROMUCOSAL | Status: DC
  Administered 2015-06-13 (×2): 7 mL via OROMUCOSAL

## 2015-06-13 MED ORDER — HYDRALAZINE HCL 20 MG/ML IJ SOLN: 10.0000 mg | INTRAMUSCULAR | Status: DC | PRN

## 2015-06-13 MED ORDER — JEVITY 1.2 CAL PO LIQD
474.0000 mL | Freq: Three times a day (TID) | ORAL | Status: DC
  Administered 2015-06-13 – 2015-06-15 (×7): 474 mL

## 2015-06-13 MED ORDER — PIPERACILLIN-TAZOBACTAM 3.375 G IVPB: 3.3750 g | Freq: Three times a day (TID) | INTRAVENOUS | Status: DC

## 2015-06-13 MED ORDER — JEVITY 1.2 CAL PO LIQD
Freq: Three times a day (TID) | ORAL | Status: DC
  Administered 2015-06-13: 474 mL
  Filled 2015-06-13: qty 237

## 2015-06-13 MED ORDER — ASPIRIN 81 MG PO CHEW
324.0000 mg | CHEWABLE_TABLET | ORAL | Status: AC
Start: 2015-06-13 — End: 2015-06-13

## 2015-06-13 MED ORDER — CHOLESTYRAMINE LIGHT 4 G PO PACK
4.0000 g | PACK | Freq: Three times a day (TID) | ORAL | Status: DC
  Administered 2015-06-14 – 2015-06-15 (×4): 4 g via ORAL
  Filled 2015-06-13 (×10): qty 1

## 2015-06-13 MED ORDER — ASPIRIN 300 MG RE SUPP
300.0000 mg | RECTAL | Status: AC
  Administered 2015-06-13: 300 mg via RECTAL
  Filled 2015-06-13: qty 1

## 2015-06-13 MED ORDER — SODIUM CHLORIDE 0.9 % IV SOLN: INTRAVENOUS | Status: DC

## 2015-06-13 MED ORDER — PROPOFOL 1000 MG/100ML IV EMUL
INTRAVENOUS | Status: AC
  Filled 2015-06-13: qty 100

## 2015-06-13 MED ORDER — DIPHENOXYLATE-ATROPINE 2.5-0.025 MG PO TABS
1.0000 | ORAL_TABLET | Freq: Four times a day (QID) | ORAL | Status: DC | PRN
  Administered 2015-06-14 – 2015-06-15 (×2): 1 via ORAL
  Filled 2015-06-13 (×2): qty 1

## 2015-06-13 MED ORDER — ENOXAPARIN SODIUM 40 MG/0.4ML ~~LOC~~ SOLN
40.0000 mg | SUBCUTANEOUS | Status: DC
  Administered 2015-06-13 – 2015-06-15 (×3): 40 mg via SUBCUTANEOUS
  Filled 2015-06-13 (×5): qty 0.4

## 2015-06-13 NOTE — H&P (Signed)
PULMONARY / CRITICAL CARE MEDICINE   Name: Nicholas Cobb MRN: [id number] DOB: [birth year]    ADMISSION DATE:  06/13/2015 CHIEF COMPLAINT:  Respiratory failure  HISTORY OF PRESENT ILLNESS:   Patient is sedated and unable to provide history.  The following is from ED: 65 y.o. male with PMHx of COPD, osteoradionecrosis of jaw, DM, HTN and throat cancer who presents to the Emergency Department via EMS complaining of difficulty breathing onset tonight.  He reportedly removed his own trach (6 cuffless) due to respiratory distress.   In the ED, RT attempted to replace cuffless trach, difficulty placing, O2 sats dropped, patient began bleeding.  ETT was inserted through the ostomy site, oxygenation improved.  Finally 6 cuffed shiley was placed by ED physician.  Peri intubation hypotension.  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  has past surgical history that includes PEG tube placement (11/1999); Tracheostomy (11/1999); and Gastrostomy tube placement. Prior to Admission medications   Medication Sig Start Date End Date Taking? Authorizing Provider  AMBULATORY NON FORMULARY MEDICATION Feeding tube ( peg tube ) supplies 12/02/14   Gildardo Cranker, DO  AMBULATORY NON FORMULARY MEDICATION Trach (Shiley 6.0 XLT UP uncuffed) Trach Care Kit, Trach Holders, Disposable inner  cannulas  Dx:C32.9 ,Z93.0 03/05/15   Gildardo Cranker, DO  cholestyramine light (PREVALITE) 4 G packet Take 1 packet (4 g total) by mouth 3 (three) times daily. 01/06/15   Gildardo Cranker, DO  ciprofloxacin (CIPRO) 500 MG tablet Take 1 tablet (500 mg total) by mouth 2 (two) times daily. 02/26/15   Gildardo Cranker, DO  diphenoxylate-atropine (LOMOTIL) 2.5-0.025 MG tablet Take 1 tablet by mouth 4 (four) times daily as needed for diarrhea or loose stools. 12/02/14   Gildardo Cranker, DO  doxycycline (VIBRA-TABS) 100 MG tablet Take 1 tablet (100 mg total) by mouth 2 (two) times daily. 02/26/15   Gildardo Cranker, DO  hydrOXYzine (VISTARIL) 25 MG capsule Take 1 capsule (25 mg total) by mouth 3 (three) times daily as needed. 03/04/15   [name], NP  ibuprofen (ADVIL,MOTRIN) 400 MG tablet Take one tablet by mouth three times daily as needed for pain 04/21/15   [name], NP  losartan (COZAAR) 100 MG tablet Take 1 tablet (100 mg total) by mouth daily. 12/02/14   Monica Carter, DO  naphazoline-pheniramine (NAPHCON-A) 0.025-0.3 % ophthalmic solution Place 2 drops into both eyes 4 (four) times daily as needed for irritation (red eye).    Historical Provider, MD  Nutritional Supplements (FEEDING SUPPLEMENT, JEVITY 1.2 CAL,) LIQD Place 237 mLs into feeding tube See admin instructions. Pt takes 2 cans at breakfast, 2 cans at lunch, 2 cans at dinner and 2 at night 12/02/14   Gildardo Cranker, DO  oxymetazoline (12 HOUR NASAL SPRAY) 0.05 % nasal spray Place 2 sprays into the nose 2 (two) times daily as needed for congestion.     Historical Provider, MD  ranitidine (ZANTAC) 150 MG tablet Take 1 tablet (150 mg total) by mouth 2 (two) times daily. 05/17/15   Tiffany L Reed, DO  tamsulosin (FLOMAX) 0.4 MG CAPS  capsule Take 1 capsule (0.4 mg total) by mouth daily. 08/06/14   [name], NP  triamcinolone cream (KENALOG) 0.1 % Apply 1 application topically 2 (two) times daily. 02/17/15   [name], NP  zoster vaccine  live, PF, (ZOSTAVAX) 50037 UNT/0.65ML injection Inject 19,400 Units into the skin once. Patient not taking: Reported on 06/09/2015 06/04/14   [name], NP   Allergies  Allergen Reactions  . Oxybutynin Chloride Nausea And Vomiting  . Codeine Nausea And Vomiting and Rash    FAMILY HISTORY:  indicated that his mother is deceased. He indicated that his father is deceased. He indicated that both of his sisters are deceased.  SOCIAL HISTORY:  reports that he quit smoking about 18 years ago. He has never used smokeless tobacco. He reports that he does not drink alcohol or use illicit drugs.  REVIEW OF SYSTEMS:  Unable to obtain  SUBJECTIVE:   VITAL SIGNS: Temp:  [98 F (36.7 C)] 98 F (36.7 C) (04/30 0123) Pulse Rate:  [79-96] 79 (04/30 0339) Resp:  [15-20] 20 (04/30 0339) BP: (86-149)/(49-72) 90/58 mmHg (04/30 0339) SpO2:  [95 %-98 %] 96 % (04/30 0339) FiO2 (%):  [100 %] 100 % (04/30 0339) HEMODYNAMICS:   VENTILATOR SETTINGS: Vent Mode:  [-] PRVC FiO2 (%):  [100 %] 100 % Set Rate:  [20 bmp] 20 bmp Vt Set:  [600 mL] 600 mL PEEP:  [5 cmH20] 5 cmH20 Plateau Pressure:  [22 cmH20] 22 cmH20 INTAKE / OUTPUT: No intake or output data in the 24 hours ending 06/13/15 0355  PHYSICAL EXAMINATION: General:  Sedated,  Neuro:  grimace to pain. Sedated on propofol HEENT:  Surgical and radiation changes to right mandible.  Significant lip edema. PERLA Cardiovascular:  RRR, s1/s2 no m/r/g Lungs:  Mechanical BS bilaterally, diffuse rhonchi bilateral Abdomen:  Soft, non tender, non distended.  PEG tube in place Musculoskeletal:  Normal bulk and tone Skin:  No c/c/e  LABS:  CBC  Recent Labs Lab 06/13/15 0218  WBC 9.1  HGB 14.7  HCT 44.4  PLT 253   Coag's No results for input(s): APTT, INR in the last 168 hours. BMET  Recent Labs Lab 06/13/15 0218  NA 134*  K 4.0  CL 99*  CO2 21*  BUN 17  CREATININE 1.27*  GLUCOSE 202*   Electrolytes  Recent Labs Lab  06/13/15 0218  CALCIUM 8.8*   Sepsis Markers No results for input(s): LATICACIDVEN, PROCALCITON, O2SATVEN in the last 168 hours. ABG No results for input(s): PHART, PCO2ART, PO2ART in the last 168 hours. Liver Enzymes  Recent Labs Lab 06/13/15 0218  AST 37  ALT 27  ALKPHOS 113  BILITOT 0.4  ALBUMIN 4.2   Cardiac Enzymes  Recent Labs Lab 06/13/15 0218  TROPONINI <0.03   Glucose No results for input(s): GLUCAP in the last 168 hours.  Imaging Dg Chest Port 1 View  06/13/2015  CLINICAL DATA:  Shortness of breath.  Trach placement. EXAM: PORTABLE CHEST 1 VIEW COMPARISON:  Shortness of breath FINDINGS: Patient has a stoma which was cannulated with a tube. The tip is in the right mainstem bronchus. More optimal positioning would require 4 to 5 cm of retraction. Left upper lobe opacity could be pneumonia or aspiration versus atelectasis from intubation. Emphysema and hyperinflation. Normal heart size. Postoperative changes in the neck including plate and screw along the right mandible which has discontiguous screw, chronic based on previous chest x-ray. These results were called by telephone at the time  of interpretation on 06/13/2015 at 3:10 am to Dr. Joseph Berkshire , who verbally acknowledged these results. IMPRESSION: 1. Right mainstem intubation.  Recommend retraction by 4 to 5 cm. 2. Left upper lobe pneumonia, aspiration, or atelectasis from selective intubation. Electronically Signed   By: Monte Fantasia M.D.   On: 06/13/2015 03:11     ASSESSMENT / PLAN:  65 yo male chronic trach depended after radiation and facial surgery a/w acute hypox respiratory failure 2/2 LUL PNA.  PULMONARY A: HCAP Acute hypoxemic respiratory failure P:   - Vanc/zosyn - Cuffed trach placed - ventilated  - PRVC 400/20/0.6/5 - ABG - current settings acceptable - CXR - Ventilatory bundle  CARDIOVASCULAR A:  Chronic hypertension Post intubation hypotension P:  - fluid bolus - decrease  propofol - hold home ARB  RENAL A:   AKI Hypovolemia Hyponatremia - hypovolemic NAGMA P:   - fluid resucitation - follow Scr - hold ARB  GASTROINTESTINAL A: GERD P:   -Resume Tube feedings - IV PPI  HEMATOLOGIC A:   Not active P:    INFECTIOUS A:   HCAP vs Aspiration P:   BCx2 4/30 UC  n/a Sputum 4/30 Abx: Vanc/zosyn, start date 4/30 RVP  ENDOCRINE A:   DM   P:   - SSI  NEUROLOGIC A:   Sedated P:   RASS goal: 0 propofol  FC/FT PPX: PPI and Enoxaparin  Tube feeds: Jevity  Total critical care time: 30 min  Critical care time was exclusive of separately billable procedures and treating other patients.  Critical care was necessary to treat or prevent imminent or life-threatening deterioration.  Critical care was time spent personally by me on the following activities: development of treatment plan with patient and/or surrogate as well as nursing, discussions with consultants, evaluation of patient's response to treatment, examination of patient, obtaining history from patient or surrogate, ordering and performing treatments and interventions, ordering and review of laboratory studies, ordering and review of radiographic studies, pulse oximetry and re-evaluation of patient's condition.   Meribeth Mattes, DO., MS [facility]    Pulmonary and [place] Pager: [phone]  06/13/2015, 3:55 AM

## 2015-06-13 NOTE — ED Notes (Signed)
Critical care doc at bedside

## 2015-06-13 NOTE — ED Notes (Signed)
Bed: RESB Expected date:  Expected time:  Means of arrival:  Comments: EMS trach out

## 2015-06-13 NOTE — Progress Notes (Signed)
Pharmacy Antibiotic Follow-up Note  Nicholas Cobb is a 65 y.o. year-old male admitted on 06/13/2015.  The patient is currently on day 1 of Vancomycin & Zosyn for r/o PNA, probable aspiration.  Assessment/Plan: Vancomycin 1gm x1, then 750mg  IV every 12 hours.  Goal trough 15-20 mcg/mL. Zosyn 3.375g IV q8h (4 hour infusion).  Temp (24hrs), Avg:97.9 F (36.6 C), Min:97.7 F (36.5 C), Max:98 F (36.7 C)   Recent Labs Lab 06/13/15 0218  WBC 9.1    Recent Labs Lab 06/13/15 0218  CREATININE 1.27*   Estimated Creatinine Clearance: 56.4 mL/min (by C-G formula based on Cr of 1.27).    Allergies  Allergen Reactions  . Oxybutynin Chloride Nausea And Vomiting  . Codeine Nausea And Vomiting and Rash   Antimicrobials this admission: 4/30 Zosyn >>  4/30 Vanc >>   Levels/dose changes this admission:  Microbiology results: 4/30 Strep pneumo Ag: pending 4/30 Legionella: ordered 4/30 BCx: sent  Thank you for allowing pharmacy to be a part of this patient's care.  Minda Ditto PharmD 06/13/2015 5:12 AM

## 2015-06-13 NOTE — Progress Notes (Signed)
eLink Physician-Brief Progress Note Patient Name: Nicholas Cobb DOB: Jan 11, 1951 MRN: [id number]   Date of Service  06/13/2015  HPI/Events of Note  Notified by bedside nurse of labile blood pressure. Patient with known history of hypertension on Cozaar at home. Patient has been hypertensive on my review of vitals today as well as hypotensive.  eICU Interventions  Labetalol IV when necessary. Continuing to monitor vitals per unit protocol. Holding on home medications.     Intervention Category Intermediate Interventions: Hypertension - evaluation and management  Tera Partridge 06/13/2015, 11:43 PM

## 2015-06-13 NOTE — ED Notes (Signed)
Patient resting, Propofol at 15 mcg/kg/min per left PIV.  Urine output clear per temp foley. PEG tube connected to LWIS with return of tube feed. MP SR with rate 70's with no ectopy noted. BP stable at current time on propofol at 15 mcg/kg/min.Total 1500 cc's fluid bolus infused.

## 2015-06-13 NOTE — Progress Notes (Signed)
PULMONARY / CRITICAL CARE MEDICINE   Name: Nicholas Cobb MRN: [id number] DOB: 1950-07-05    ADMISSION DATE:  06/13/2015 CHIEF COMPLAINT:  Respiratory failure  BRIEF:  65 y/o male with COPD admitted on 4/30 with acute respiratory failure with hypoxemia.  He has a baseline tracheostomy which was emergently changed to a 6 cuffed by the ER physician on 4/30.  SUBJECTIVE:  Brought to ICU overnight  VITAL SIGNS: Temp:  [97.7 F (36.5 C)-98.4 F (36.9 C)] 97.9 F (36.6 C) (04/30 0900) Pulse Rate:  [56-96] 57 (04/30 0900) Resp:  [15-21] 20 (04/30 0900) BP: (69-149)/(41-92) 102/50 mmHg (04/30 0900) SpO2:  [88 %-100 %] 100 % (04/30 0900) FiO2 (%):  [30 %-100 %] 30 % (04/30 0810) Weight:  [77.4 kg (170 lb 10.2 oz)] 77.4 kg (170 lb 10.2 oz) (04/30 0500) HEMODYNAMICS:   VENTILATOR SETTINGS: Vent Mode:  [-] PRVC FiO2 (%):  [30 %-100 %] 30 % Set Rate:  [20 bmp] 20 bmp Vt Set:  [600 mL] 600 mL PEEP:  [5 cmH20] 5 cmH20 Plateau Pressure:  [17 cmH20-22 cmH20] 17 cmH20 INTAKE / OUTPUT:  Intake/Output Summary (Last 24 hours) at 06/13/15 0917 Last data filed at 06/13/15 0830  Gross per 24 hour  Intake 2380.48 ml  Output    260 ml  Net 2120.48 ml    PHYSICAL EXAMINATION:  Gen: comfortable on vent HENT: massive swelling over left jaw, trach in place PULM CTA B, vent supported breaths CV: RRR, no mgr GI: PEG tube, soft, nontender MSK: normal bulk and tone Neuro: awake, following commands  LABS:  CBC  Recent Labs Lab 06/13/15 0218 06/13/15 0701  WBC 9.1 8.2  HGB 14.7 12.7*  HCT 44.4 37.0*  PLT 253 186   Coag's No results for input(s): APTT, INR in the last 168 hours. BMET  Recent Labs Lab 06/13/15 0218 06/13/15 0701  NA 134* 135  K 4.0 5.3*  CL 99* 104  CO2 21* 21*  BUN 17 17  CREATININE 1.27* 0.99  GLUCOSE 202* 95   Electrolytes  Recent Labs Lab 06/13/15 0218 06/13/15 0701  CALCIUM 8.8* 8.2*  MG  --  2.0  PHOS  --  2.9   Sepsis Markers  Recent  Labs Lab 06/13/15 0701  LATICACIDVEN 2.2*  PROCALCITON <0.10   ABG  Recent Labs Lab 06/13/15 0359  PHART 7.338*  PCO2ART 42.4  PO2ART 323*   Liver Enzymes  Recent Labs Lab 06/13/15 0218  AST 37  ALT 27  ALKPHOS 113  BILITOT 0.4  ALBUMIN 4.2   Cardiac Enzymes  Recent Labs Lab 06/13/15 0218  TROPONINI <0.03   Glucose No results for input(s): GLUCAP in the last 168 hours.  Imaging Dg Chest Port 1 View  06/13/2015  CLINICAL DATA:  Shortness of breath.  Trach placement. EXAM: PORTABLE CHEST 1 VIEW COMPARISON:  Shortness of breath FINDINGS: Patient has a stoma which was cannulated with a tube. The tip is in the right mainstem bronchus. More optimal positioning would require 4 to 5 cm of retraction. Left upper lobe opacity could be pneumonia or aspiration versus atelectasis from intubation. Emphysema and hyperinflation. Normal heart size. Postoperative changes in the neck including plate and screw along the right mandible which has discontiguous screw, chronic based on previous chest x-ray. These results were called by telephone at the time of interpretation on 06/13/2015 at 3:10 am to Dr. Joseph Berkshire , who verbally acknowledged these results. IMPRESSION: 1. Right mainstem intubation.  Recommend retraction by 4 to  5 cm. 2. Left upper lobe pneumonia, aspiration, or atelectasis from selective intubation. Electronically Signed   By: Monte Fantasia M.D.   On: 06/13/2015 03:11     ASSESSMENT / PLAN:  65 yo male chronic trach depended after radiation and facial surgery a/w acute hypox respiratory failure 2/2 LUL PNA.  Appears much better 4/30.  PULMONARY A: CAP?  Acute hypoxemic respiratory failure > improving Followed in trach clinic History of head and neck cancer P:   Trach collar today Would be OK with speaking valve today VAP bundle Repeat CXR tomorrow> infiltrate still there?  CARDIOVASCULAR A:  Chronic hypertension Post intubation hypotension > not  septic P:  Wean sedation Fluids again today   RENAL A:   AKI > resolved P:   Gentle IVF today  GASTROINTESTINAL A: GERD Chronic PEG/enteral feeding P:   Resume Tube feedings IV PPI  HEMATOLOGIC A:   Not active P:    INFECTIOUS A:   CAP? Recent osteomyelitis of jaw? P:   BCx2 4/30 UC  n/a Sputum 4/30 Abx: Vanc/zosyn, start date 4/30 > change to ceftriaxone and azithro RVP  ENDOCRINE A:   DM   P:   SSI  NEUROLOGIC A:   Sedated P:   RASS goal: 0 Propofol per PAD protocol   My cc time 40 minutes in addition to Dr. Diona Fanti   Roselie Awkward, MD [facility] Pager: [phone] Cell: [phone] After 3pm or if no response, call [phone]  06/13/2015, 9:17 AM

## 2015-06-13 NOTE — Progress Notes (Signed)
Initial Nutrition Assessment  INTERVENTION:   Continue Home Tube Feeding Regimen: Jevity 1.2, 8 cans daily (2 cans at 6am, 12pm, 5pm, 10pm) Provides 2280 kcal (107% of needs), 104g protein (95% of needs), and 1528 ml H2O.  If IVF d/c, recommend free water flushes of 60 ml before and after each bolus feed to provide additional fluid needs.  RD to continue to monitor  NUTRITION DIAGNOSIS:   Inadequate oral intake related to inability to eat as evidenced by NPO status.  GOAL:   Patient will meet greater than or equal to 90% of their needs  MONITOR:   Vent status, Labs, Weight trends, TF tolerance, I & O's  REASON FOR ASSESSMENT:   Consult, Ventilator Assessment of nutrition requirement/status (Tube feeding recommendations)  ASSESSMENT:   65 y/o male with COPD admitted on 4/30 with acute respiratory failure with hypoxemia. He has a baseline tracheostomy which was emergently changed to a 6 cuffed by the ER physician on 4/30. PMHx of laryngeal Ca.  Patient in room with no family present. Pt was able to answer questions via pen and paper. Pt verified home TF regimen of Jevity 1.2, 8 cans daily via PEG. He was unable to state amount of free water flushes he administers at home. Pt states that he administers all his feedings by himself, writes "I can do this in my sleep". He has had a PEG for a long time. Pt did not receive first feeding this morning from RN d/t blood pressure and sedation. Pt states he administers all his medications via his PEG as well.  Nutrition focused physical exam shows no sign of depletion of muscle mass or body fat. Patient's weight has remained stable with his current TF regimen, so will continue it here.   Patient is currently intubated on ventilator support MV: 10  L/min Temp (24hrs), Avg:98.1 F (36.7 C), Min:97.7 F (36.5 C), Max:98.4 F (36.9 C)  Propofol: was turned off in room.  Medications reviewed. Labs reviewed: Elevated K  Diet Order:      Skin:  Reviewed, no issues  Last BM:  PTA  Height:   Ht Readings from Last 1 Encounters:  06/13/15 5\' 9"  (1.753 m)    Weight:   Wt Readings from Last 1 Encounters:  06/13/15 170 lb 10.2 oz (77.4 kg)    Ideal Body Weight:  72.7 kg  BMI:  Body mass index is 25.19 kg/(m^2).  Estimated Nutritional Needs:   Kcal:  [id number]  Protein:  110-120g  Fluid:  2.1L/day  EDUCATION NEEDS:   No education needs identified at this time  Clayton Bibles, MS, RD, LDN Pager: [phone] After Hours Pager: [phone]

## 2015-06-13 NOTE — ED Provider Notes (Addendum)
CSN: [id number]     Arrival date & time 06/13/15  0122 History   By signing my name below, I, Rowan Blase, attest that this documentation has been prepared under the direction and in the presence of Orpah Greek, MD . Electronically Signed: Rowan Blase, Scribe. 06/13/2015. 2:20 AM.   Chief Complaint  Patient presents with  . Pulled trach out   The history is provided by the patient. No language interpreter was used.   HPI Comments:  MANCIL PFENNING is a 65 y.o. male with PMHx of COPD, osteoradionecrosis of jaw, DM, HTN and throat cancer who presents to the Emergency Department via EMS complaining of difficulty breathing onset tonight. Pt pulled his trach out ~1 hour ago because he was experiencing pain with breathing. No alleviating or exacerbating factors noted. Pt states he has experienced difficulty with his trach since he lost his ENT specialist in November 2015.   Past Medical History  Diagnosis Date  . Hearing loss   . Change in voice   . Rash   . Trouble swallowing   . Difficulty urinating   . Cervical spondylosis 03/25/2011  . Emphysema 03/25/2011  . GERD (gastroesophageal reflux disease) 03/25/2011  . Chronic pain 03/25/2011  . 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 Xerostomia 03/25/2011  . Ulcer   . UTI (lower urinary tract infection)   . Allergy   . Chronic sinusitis 04/27/2011  . Impaired glucose tolerance 04/27/2011  . Cervical spondylolysis 04/30/2011    severe  . Cancer ([place]) 1999    throat cancer  . Essential hypertension, benign 12/03/2012  . Type II or unspecified type diabetes mellitus without mention of complication, uncontrolled 04/27/2011  . Depression    Past Surgical History  Procedure Laterality Date  . Peg tube placement  11/1999  . Tracheostomy  11/1999  . Gastrostomy tube placement     Family History  Problem Relation Age of Onset  . Stroke Other   . Diabetes Other   . Cancer Other     lung cancer  . Diabetes Mother   . Heart  disease Father    Social History  Substance Use Topics  . Smoking status: Former Smoker -- 23 years    Quit date: 11/06/1996  . Smokeless tobacco: Never Used  . Alcohol Use: No    Review of Systems  Respiratory: Positive for shortness of breath.   Musculoskeletal: Positive for neck pain.  All other systems reviewed and are negative.  Allergies  Oxybutynin chloride and Codeine  Home Medications   Prior to Admission medications   Medication Sig Start Date End Date Taking? Authorizing Provider  AMBULATORY NON FORMULARY MEDICATION Feeding tube ( peg tube ) supplies 12/02/14   Gildardo Cranker, DO  AMBULATORY NON FORMULARY MEDICATION Trach (Shiley 6.0 XLT UP uncuffed) Trach Care Kit, Trach Holders, Disposable inner cannulas  Dx:C32.9 ,Z93.0 03/05/15   Gildardo Cranker, DO  cholestyramine light (PREVALITE) 4 G packet Take 1 packet (4 g total) by mouth 3 (three) times daily. 01/06/15   Gildardo Cranker, DO  ciprofloxacin (CIPRO) 500 MG tablet Take 1 tablet (500 mg total) by mouth 2 (two) times daily. 02/26/15   Gildardo Cranker, DO  diphenoxylate-atropine (LOMOTIL) 2.5-0.025 MG tablet Take 1 tablet by mouth 4 (four) times daily as needed for diarrhea or loose stools. 12/02/14   Gildardo Cranker, DO  doxycycline (VIBRA-TABS) 100 MG tablet Take 1 tablet (100 mg total) by mouth 2 (two) times daily. 02/26/15   Gildardo Cranker, DO  hydrOXYzine (  VISTARIL) 25 MG capsule Take 1 capsule (25 mg total) by mouth 3 (three) times daily as needed. 03/04/15   [name], NP  ibuprofen (ADVIL,MOTRIN) 400 MG tablet Take one tablet by mouth three times daily as needed for pain 04/21/15   [name], NP  losartan (COZAAR) 100 MG tablet Take 1 tablet (100 mg total) by mouth daily. 12/02/14   Monica Carter, DO  naphazoline-pheniramine (NAPHCON-A) 0.025-0.3 % ophthalmic solution Place 2 drops into both eyes 4 (four) times daily as needed for irritation (red eye).    Historical Provider, MD  Nutritional Supplements  (FEEDING SUPPLEMENT, JEVITY 1.2 CAL,) LIQD Place 237 mLs into feeding tube See admin instructions. Pt takes 2 cans at breakfast, 2 cans at lunch, 2 cans at dinner and 2 at night 12/02/14   Gildardo Cranker, DO  oxymetazoline (12 HOUR NASAL SPRAY) 0.05 % nasal spray Place 2 sprays into the nose 2 (two) times daily as needed for congestion.     Historical Provider, MD  ranitidine (ZANTAC) 150 MG tablet Take 1 tablet (150 mg total) by mouth 2 (two) times daily. 05/17/15   Tiffany L Reed, DO  tamsulosin (FLOMAX) 0.4 MG CAPS capsule Take 1 capsule (0.4 mg total) by mouth daily. 08/06/14   [name], NP  triamcinolone cream (KENALOG) 0.1 % Apply 1 application topically 2 (two) times daily. 02/17/15   [name], NP  zoster vaccine live, PF, (ZOSTAVAX) 99242 UNT/0.65ML injection Inject 19,400 Units into the skin once. Patient not taking: Reported on 06/09/2015 06/04/14   [name], NP   BP 90/58 mmHg  Pulse 79  Temp(Src) 98 F (36.7 C) (Oral)  Resp 20  SpO2 96% Physical Exam  Constitutional: He is oriented to person, place, and time. He appears well-developed and well-nourished. No distress.  HENT:  Head: Normocephalic and atraumatic.  Right Ear: Hearing normal.  Left Ear: Hearing normal.  Nose: Nose normal.  Mouth/Throat: Oropharynx is clear and moist and mucous membranes are normal.  Eyes: Conjunctivae and EOM are normal. Pupils are equal, round, and reactive to light.  Neck: Normal range of motion. Neck supple.  Intact stoma without swelling, redness, bleeding or drainage  Cardiovascular: Regular rhythm, S1 normal and S2 normal.  Exam reveals no gallop and no friction rub.   No murmur heard. Pulmonary/Chest: Effort normal and breath sounds normal. No respiratory distress. He exhibits no tenderness.  Abdominal: Soft. Normal appearance and bowel sounds are normal. There is no hepatosplenomegaly. There is no tenderness. There is no rebound, no guarding, no tenderness at McBurney's  point and negative Murphy's sign. No hernia.  Musculoskeletal: Normal range of motion.  Neurological: He is alert and oriented to person, place, and time. He has normal strength. No cranial nerve deficit or sensory deficit. Coordination normal. GCS eye subscore is 4. GCS verbal subscore is 5. GCS motor subscore is 6.  Skin: Skin is warm, dry and intact. No rash noted. No cyanosis.  Psychiatric: He has a normal mood and affect. His speech is normal and behavior is normal. Thought content normal.  Nursing note and vitals reviewed.  ED Course  Procedures  DIAGNOSTIC STUDIES:  Oxygen Saturation is 98% on RA, normal by my interpretation.    COORDINATION OF CARE:  1:32 AM Will replace trach. Discussed treatment plan with pt at bedside and pt agreed to plan. 1:55 Pt walking around room, vomited, and started bleeding from stoma. 2:01 AM Trach re-inserted. Pt ventilated with Ambu bag. 2:18 AM  Will order chest x-ray, CBC, CMP, troponin, and BNP. 3:19 AM Consult to critical care, Dr. Jimmy Footman   Labs Review Labs Reviewed  CBC WITH DIFFERENTIAL/PLATELET - Abnormal; Notable for the following:    Monocytes Absolute 1.5 (*)    All other components within normal limits  COMPREHENSIVE METABOLIC PANEL - Abnormal; Notable for the following:    Sodium 134 (*)    Chloride 99 (*)    CO2 21 (*)    Glucose, Bld 202 (*)    Creatinine, Ser 1.27 (*)    Calcium 8.8 (*)    GFR calc non Af Amer 57 (*)    All other components within normal limits  BLOOD GAS, ARTERIAL - Abnormal; Notable for the following:    pH, Arterial 7.338 (*)    pO2, Arterial 323 (*)    Acid-base deficit 3.0 (*)    All other components within normal limits  CULTURE, BLOOD (ROUTINE X 2)  CULTURE, BLOOD (ROUTINE X 2)  CULTURE, RESPIRATORY (NON-EXPECTORATED)  RESPIRATORY VIRUS PANEL  TROPONIN I  BRAIN NATRIURETIC PEPTIDE  TRIGLYCERIDES  URINALYSIS, ROUTINE W REFLEX MICROSCOPIC (NOT AT [facility])  MAGNESIUM  PHOSPHORUS  LIPASE, BLOOD   LACTIC ACID, PLASMA  PROCALCITONIN  STREP PNEUMONIAE URINARY ANTIGEN  LEGIONELLA PNEUMOPHILA SEROGP 1 UR AG  BLOOD GAS, ARTERIAL  CBC  BASIC METABOLIC PANEL  CBC  CREATININE, SERUM    Imaging Review Dg Chest Port 1 View  06/13/2015  CLINICAL DATA:  Shortness of breath.  Trach placement. EXAM: PORTABLE CHEST 1 VIEW COMPARISON:  Shortness of breath FINDINGS: Patient has a stoma which was cannulated with a tube. The tip is in the right mainstem bronchus. More optimal positioning would require 4 to 5 cm of retraction. Left upper lobe opacity could be pneumonia or aspiration versus atelectasis from intubation. Emphysema and hyperinflation. Normal heart size. Postoperative changes in the neck including plate and screw along the right mandible which has discontiguous screw, chronic based on previous chest x-ray. These results were called by telephone at the time of interpretation on 06/13/2015 at 3:10 am to Dr. Joseph Berkshire , who verbally acknowledged these results. IMPRESSION: 1. Right mainstem intubation.  Recommend retraction by 4 to 5 cm. 2. Left upper lobe pneumonia, aspiration, or atelectasis from selective intubation. Electronically Signed   By: Monte Fantasia M.D.   On: 06/13/2015 03:11   I have personally reviewed and evaluated these images and lab results as part of my medical decision-making.   EKG Interpretation   Date/Time:  Sunday June 13 2015 03:32:27 EDT Ventricular Rate:  78 PR Interval:  141 QRS Duration: 74 QT Interval:  373 QTC Calculation: 425 R Axis:   99 Text Interpretation:  Sinus rhythm Right axis deviation Abnormal R-wave  progression, early transition Nonspecific T abnormalities, lateral leads  Minimal ST elevation, anterior leads No significant change since last  tracing Confirmed by POLLINA  MD, CHRISTOPHER [phone]) on 06/13/2015 5:07:30  AM     CRITICAL CARE Performed by: Orpah Greek, MD Total critical care time: 35 minutes Critical care  time was exclusive of separately billable procedures and treating other patients. Critical care was necessary to treat or prevent imminent or life-threatening deterioration. Critical care was time spent personally by me on the following activities: development of treatment plan with patient and/or surrogate as well as nursing, discussions with consultants, evaluation of patient's response to treatment, examination of patient, obtaining history from patient or surrogate, ordering and performing treatments and interventions, ordering and review of laboratory  studies, ordering and review of radiographic studies, pulse oximetry and re-evaluation of patient's condition.  MDM   Final diagnoses:  Acute respiratory failure with hypoxia [facility])   Patient presents to the emergency department with complaints of shortness of breath. Patient is trach dependent secondary to previous head and neck cancer and radiation changes. Patient reports that he took his trach cannula out approximately 2 hours before arrival in the ER. This did improve his breathing, but he ws starting to have increased trouble breathing again.  Respiratory therapy attempted to replace his trach. There was minor trauma to the stoma and he started bleeding. Patient became extremely agitated, started pacing around the room and then in the hallway. Respiratory rate increased and became more short of breath. Patient sat down on a chair and then vomited and collapsed. He stopped breathing. He was moved to a gurney and a 6.0 endotracheal tube was placed through his stoma and the cuff was inflated. He was then backed into his oxygen saturation was detectable. Patient did not lose pulses during this procedure and he did not require CPR.  Patient was difficult to ventilate with the endotracheal tube in place. The tube would intermittently enter the right mainstem and the patient's oxygen saturations were decreased. A 60 cuffed Shiley trach was therefore  exchanged for the endotracheal tube and the patient was easier to ventilate. He has been maintained on propofol drip for sedation. Chest x-ray shows infiltrate in the left upper lobe. This might be atelectasis from the intermittent right mainstem intubation, however, after changing out to trach he did not regain normal breath sounds on the left side. This is concerning for aspiration pneumonia. Hypotension occurred after intubation and this is felt to be secondary to propofol use. I do not see any sign of sepsis.  Discussed briefly with Dr. Jimmy Footman. Critical care fellow will evaluate the patient and admit to the ICU.  I personally performed the services described in this documentation, which was scribed in my presence. The recorded information has been reviewed and is accurate.    Orpah Greek, MD 06/13/15 [place], MD 06/13/15 [phone]

## 2015-06-13 NOTE — ED Notes (Signed)
Contact is daughter Purcell Mouton [phone]

## 2015-06-13 NOTE — ED Notes (Addendum)
Dr. Betsey Holiday at bedside attempting to place trach.  #6 XLT cuffless placed and patient up in room and agitated and pulled out trach tube. Patient pacing in room after pulling out trach again and over to chair and vomited and patient became unresponsive with no respirations.  Dr. Betsey Holiday at bedside and patient lifted back to stretcher and #5 ETT placed in stoma per Dr. Betsey Holiday and patient bagged.  Propofol started for sedation due to patient with extreme agitation, kicking out at staff and attempting to pull ETT out of trach stoma.

## 2015-06-13 NOTE — ED Notes (Signed)
Nicholas Cobb, RT paged to bedside to replace trach.  Supplies for new trach are at bedside.  Dr. Betsey Holiday to bedside as well.

## 2015-06-13 NOTE — ED Notes (Signed)
Per EMS pt pulled trach out at approximately 0000 because it hurt for him to breathe.  Pt reported to EMS that he feels better w/ the trach not in place.  Presents to have trach replaced.  Denies pain.

## 2015-06-13 NOTE — ED Notes (Signed)
Airway unstable, O2 sat dropping into 50's, patient removed from vent and bagged with 100% FIO2, Dr. Betsey Holiday at bedside and placed #6DCT shiley and patient bagged to 100% O2 sat, then placed back on vent.  Daughter at bedside and updated on patients condition and plan of care.  Propofol being titrated due to SBP upper 70's.

## 2015-06-14 ENCOUNTER — Inpatient Hospital Stay (HOSPITAL_COMMUNITY): Payer: Medicare Other

## 2015-06-14 DIAGNOSIS — J9610 Chronic respiratory failure, unspecified whether with hypoxia or hypercapnia: Secondary | ICD-10-CM

## 2015-06-14 DIAGNOSIS — J9621 Acute and chronic respiratory failure with hypoxia: Secondary | ICD-10-CM

## 2015-06-14 LAB — BASIC METABOLIC PANEL
Anion gap: 9 (ref 5–15)
BUN: 12 mg/dL (ref 6–20)
CALCIUM: 8.9 mg/dL (ref 8.9–10.3)
CO2: 26 mmol/L (ref 22–32)
Chloride: 101 mmol/L (ref 101–111)
Creatinine, Ser: 0.88 mg/dL (ref 0.61–1.24)
GFR calc Af Amer: >60 mL/min (ref >60)
GLUCOSE: 171 mg/dL — AB (ref 65–99)
Potassium: 3.9 mmol/L (ref 3.5–5.1)
Sodium: 136 mmol/L (ref 135–145)

## 2015-06-14 LAB — GLUCOSE, CAPILLARY
GLUCOSE-CAPILLARY: 141 mg/dL — AB (ref 65–99)
GLUCOSE-CAPILLARY: 99 mg/dL (ref 65–99)
Glucose-Capillary: 98 mg/dL (ref 65–99)
Glucose-Capillary: 70 mg/dL (ref 65–99)
Glucose-Capillary: 112 mg/dL — ABNORMAL HIGH (ref 65–99)

## 2015-06-14 LAB — RESPIRATORY VIRUS PANEL
ADENOVIRUS: NEGATIVE
INFLUENZA B 1: NEGATIVE
Influenza A: NEGATIVE
Metapneumovirus: NEGATIVE
PARAINFLUENZA 1 A: NEGATIVE
PARAINFLUENZA 2 A: NEGATIVE
Parainfluenza 3: NEGATIVE
RESPIRATORY SYNCYTIAL VIRUS B: NEGATIVE
RHINOVIRUS: NEGATIVE
Respiratory Syncytial Virus A: NEGATIVE

## 2015-06-14 LAB — CBC WITH DIFFERENTIAL/PLATELET
BASOS ABS: 0 10*3/uL (ref 0.0–0.1)
BASOS PCT: 0 %
Eosinophils Absolute: 0.1 10*3/uL (ref 0.0–0.7)
Eosinophils Relative: 2 %
HEMATOCRIT: 40.5 % (ref 39.0–52.0)
Hemoglobin: 13.7 g/dL (ref 13.0–17.0)
LYMPHS PCT: 10 %
Lymphs Abs: 0.7 10*3/uL (ref 0.7–4.0)
MCH: 31.2 pg (ref 26.0–34.0)
MCHC: 33.8 g/dL (ref 30.0–36.0)
MCV: 92.3 fL (ref 78.0–100.0)
MONO ABS: 0.9 10*3/uL (ref 0.1–1.0)
MONOS PCT: 13 %
Neutro Abs: 5 10*3/uL (ref 1.7–7.7)
Neutrophils Relative %: 75 %
Platelets: 204 10*3/uL (ref 150–400)
RBC: 4.39 MIL/uL (ref 4.22–5.81)
RDW: 13 % (ref 11.5–15.5)
WBC: 6.6 10*3/uL (ref 4.0–10.5)

## 2015-06-14 LAB — LEGIONELLA PNEUMOPHILA SEROGP 1 UR AG: L. PNEUMOPHILA SEROGP 1 UR AG: NEGATIVE

## 2015-06-14 MED ORDER — CEFDINIR 125 MG/5ML PO SUSR
300.0000 mg | Freq: Two times a day (BID) | ORAL | Status: DC
  Administered 2015-06-14 – 2015-06-15 (×3): 300 mg
  Filled 2015-06-14 (×5): qty 15

## 2015-06-14 MED ORDER — AZITHROMYCIN 200 MG/5ML PO SUSR
250.0000 mg | Freq: Every day | ORAL | Status: DC
  Administered 2015-06-15: 250 mg
  Filled 2015-06-14 (×3): qty 10

## 2015-06-14 MED ORDER — NICARDIPINE HCL IN NACL 20-0.86 MG/200ML-% IV SOLN
3.0000 mg/h | INTRAVENOUS | Status: DC
  Administered 2015-06-14: 5 mg/h via INTRAVENOUS
  Administered 2015-06-15: 1 mg/h via INTRAVENOUS
  Filled 2015-06-14 (×3): qty 200

## 2015-06-14 MED ORDER — VITAMINS A & D EX OINT
TOPICAL_OINTMENT | CUTANEOUS | Status: AC
  Administered 2015-06-14: 18:00:00
  Filled 2015-06-14: qty 5

## 2015-06-14 NOTE — Evaluation (Addendum)
[facility] Speaking Valve - Evaluation Patient Details  Name: Nicholas Cobb MRN: [id number] Date of Birth: 1950/08/17  Today's Date: 06/14/2015 Time: 1215-1300 SLP Time Calculation (min) (ACUTE ONLY): 45 min  Past Medical History:  Past Medical History  Diagnosis Date  . Hearing loss   . Change in voice   . Rash   . Trouble swallowing   . Difficulty urinating   . Cervical spondylosis 03/25/2011  . Emphysema 03/25/2011  . GERD (gastroesophageal reflux disease) 03/25/2011  . Chronic pain 03/25/2011  . 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 Xerostomia 03/25/2011  . Ulcer   . UTI (lower urinary tract infection)   . Allergy   . Chronic sinusitis 04/27/2011  . Impaired glucose tolerance 04/27/2011  . Cervical spondylolysis 04/30/2011    severe  . Cancer ([place]) 1999    throat cancer  . Essential hypertension, benign 12/03/2012  . Type II or unspecified type diabetes mellitus without mention of complication, uncontrolled 04/27/2011  . Depression    Past Surgical History:  Past Surgical History  Procedure Laterality Date  . Peg tube placement  11/1999  . Tracheostomy  11/1999  . Gastrostomy tube placement     HPI:    Pt with PMH + for laryngeal cancer s/p XRT.  He reports ability to speak until November 2016 - is seeing an ENT to help with his aphonia.  PMSV referral made.   Assessment / Plan / Recommendation Clinical Impression  At this time, pt is not appropriate for PMSV.  Upon occlusion of trach tube, pt unable to phonate nor move air through upper airway - single attempt only made by pt.  Mr Thresher became frustrated with SLP questions evidenced by emphatically pointing to items he has written.  Mr Wandalee Ferdinand did communicate very effectively and copiously via writing and reports he saw Dr Redmond Baseman, ENT, approximately six weeks ago for his inability to talk.   Educated pt to inability to use pmsv at this time and recommendations for ENT follow up.    Limited ROM noted - lingual, labial,  facial, mandibular and SLP suspects pt with baseline dysarthria as a result .    Pt reports he has been unable to talk since November and wrote he needs to have a surgery to fix his ability to talk.  SLP questions impact of laryngeal cancer and tx on his phonatory ability and also ? If Edema impacting upper airway patency.    SLP offered to provide him with dry erase board to ease writing, but he declined.  SLP wrote down information on [place] assist[facility]onology program ([facility]) for pt as he is likely a good candidate for alternative communication means.   (Web site was not working, therefore printed information with phone number).    Recommend follow up with ENT for phonation and [facility] program for expressive communication.  If pt becomes able to phonate in future, would recommend referral for PMSV at that time.  Thanks.     SLP Assessment  All further Speech Lanaguage Pathology  needs can be addressed in the next venue of care (consider [facility] referral as an OP)    Follow Up Recommendations    see below   Frequency and Duration     n/a    PMSV Trial Able to redirect subglottic air through upper airway: No Able to Attain Phonation: No Able to Expectorate Secretions: No attempts Intelligibility: Unable to assess (comment) (pt with poor lingual, labial, mandibular movement, ? due to edema ) Respirations During  Trial: 22 SpO2 During Trial: 92 % Pulse During Trial: 72 Behavior: Alert;Responsive to questions;Other (comment) (pt only communicates via writing, not attempt to articulate)   Tracheostomy Tube  Additional Tracheostomy Tube Assessment Fenestrated: No    Vent Dependency  Vent Dependent: No FiO2 (%): 28 %    Cuff Deflation Trial  GO Behavior: Alert;Expresses self well (expressing self via writing and pointing) Cuff Deflation Trial - Comments: trach is cuffless        Luanna Salk, [facility] SLP [phone]

## 2015-06-14 NOTE — Progress Notes (Signed)
Pt having elevated blood pressure. Day shift RN gave PRN Labatelol BP down temporarily now back up. E Link called to notify. RN to continue to monitor.

## 2015-06-14 NOTE — Progress Notes (Signed)
eLink Physician-Brief Progress Note Patient Name: Nicholas Cobb DOB: May 18, 1950 MRN: [id number]   Date of Service  06/14/2015  HPI/Events of Note  Hypertension - BP = 234/132. Patient is allergic to Hydralazine.   eICU Interventions  Will order a Nicardipine IV infusion. Titrate to SBP < 175.     Intervention Category Major Interventions: Hypertension - evaluation and management  [name] 06/14/2015, 9:36 PM

## 2015-06-14 NOTE — Procedures (Signed)
Tracheostomy tube change: Informed verbal consent was obtained after explaining the risks (including bleeding and infection), benefits and alternatives of the procedure. Verbal timeout was performed prior to the procedure. The old  # 6 cuffed 6  trach was carefully removed. the tracheostomy site appeared: erythremic w/ mild bloody d/c. A new #  6 unCuffed prox xlt trach was easily placed in the tracheostomy stoma and secured with velcro trach ties. The tracheostomy was patent, good color change observed via EZ-CAP, and the patient was easily able to voice with finger occlusion and tolerated the procedure well with no immediate complications.   [name] ACNP-BC [place] Pager # [phone] OR # [phone] if no answer

## 2015-06-14 NOTE — Progress Notes (Addendum)
Pt very time consuming with specific ways to take care of himself. (30-40 minutes per RN visit to room) He is impulsive and requires every request to be  immediate. Writing notes to explain needs/wants to staff every visit to room. He has asked for the "right" size syringes to be brought from home via daughter and for clothes. I spoke with his daughter, Elvin So and she will be here between 2:30 and 3:00pm to retrieve key to his house. Eager to return home. Pt requires high intensity score for staff due to amount of time to complete tasks and specific/numerous requests.

## 2015-06-14 NOTE — Progress Notes (Signed)
PULMONARY / CRITICAL CARE MEDICINE   Name: Nicholas Cobb MRN: [id number] DOB: [birth year]    ADMISSION DATE:  06/13/2015 CHIEF COMPLAINT:  Respiratory failure  BRIEF:  65 y/o male with COPD admitted on 4/30 with acute respiratory failure with hypoxemia.  He has a baseline tracheostomy which was emergently changed to a 6 cuffed by the ER physician on 4/30.  SUBJECTIVE:  Brought to ICU overnight  VITAL SIGNS: Temp:  [98.4 F (36.9 C)-99.7 F (37.6 C)] 98.5 F (36.9 C) (05/01 0800) Pulse Rate:  [65-96] 68 (05/01 0600) Resp:  [17-27] 17 (05/01 0600) BP: (129-211)/(64-137) 211/86 mmHg (05/01 0600) SpO2:  [92 %-100 %] 97 % (05/01 0600) FiO2 (%):  [28 %-35 %] 28 % (05/01 0413) HEMODYNAMICS:   VENTILATOR SETTINGS: Vent Mode:  [-]  FiO2 (%):  [28 %-35 %] 28 % INTAKE / OUTPUT:  Intake/Output Summary (Last 24 hours) at 06/14/15 1050 Last data filed at 06/14/15 0600  Gross per 24 hour  Intake   2085 ml  Output   2425 ml  Net   -340 ml    PHYSICAL EXAMINATION:  Gen: comfortable up in chair  HENT: massive swelling over left jaw, trach in place-->this is his baseline for last several months  PULM CTA B, no accessory use  CV: RRR, no mgr GI: PEG tube, soft, nontender MSK: normal bulk and tone Neuro: awake, following commands  LABS:  CBC  Recent Labs Lab 06/13/15 0218 06/13/15 0701 06/14/15 0416  WBC 9.1 8.2 6.6  HGB 14.7 12.7* 13.7  HCT 44.4 37.0* 40.5  PLT 253 186 204   Coag's No results for input(s): APTT, INR in the last 168 hours. BMET  Recent Labs Lab 06/13/15 0218 06/13/15 0701 06/14/15 0416  NA 134* 135 136  K 4.0 5.3* 3.9  CL 99* 104 101  CO2 21* 21* 26  BUN 17 17 12   CREATININE 1.27* 0.99 0.88  GLUCOSE 202* 95 171*   Electrolytes  Recent Labs Lab 06/13/15 0218 06/13/15 0701 06/14/15 0416  CALCIUM 8.8* 8.2* 8.9  MG  --  2.0  --   PHOS  --  2.9  --    Sepsis Markers  Recent Labs Lab 06/13/15 0701  LATICACIDVEN 2.2*  PROCALCITON  <0.10   ABG  Recent Labs Lab 06/13/15 0359  PHART 7.338*  PCO2ART 42.4  PO2ART 323*   Liver Enzymes  Recent Labs Lab 06/13/15 0218  AST 37  ALT 27  ALKPHOS 113  BILITOT 0.4  ALBUMIN 4.2   Cardiac Enzymes  Recent Labs Lab 06/13/15 0218  TROPONINI <0.03   Glucose  Recent Labs Lab 06/13/15 1246 06/13/15 1647 06/13/15 1952 06/14/15 0104 06/14/15 0510 06/14/15 0827  GLUCAP 84 121* 92 98 141* 99    Imaging Dg Chest Port 1 View  06/14/2015  CLINICAL DATA:  Community acquired pneumonia. History of emphysema. EXAM: PORTABLE CHEST 1 VIEW COMPARISON:  06/13/2015. FINDINGS: Tracheostomy in good position. Previously placed RIGHT mainstem bronchus tube has been removed. Normal cardiomediastinal silhouette. Mild vascular congestion without consolidation. No pneumothorax. IMPRESSION: Slight worsening aeration.  Mild vascular congestion. No evidence for additional tubes placed through tracheostomy at this time. Electronically Signed   By: Staci Righter M.D.   On: 06/14/2015 07:33     ASSESSMENT / PLAN:  65 yo male chronic trach depended after radiation and facial surgery a/w acute hypox respiratory failure 2/2 LUL PNA. Changing him to cuffless and oral abx. If no issues he can go home tomorrow.  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 prox xlt  VAP bundle Change abx to via tube omnicef and azith  BCx2 4/30>>> Sputum 4/30>>> RVP 4/30>>>  Chronic hypertension Plan   GERD Chronic PEG/enteral feeding P:   Resume Tube feedings IV PPI   DM   P:   SSI  [name] ACNP-BC [place] Pager # [phone] OR # [phone] if no answer

## 2015-06-15 DIAGNOSIS — J9600 Acute respiratory failure, unspecified whether with hypoxia or hypercapnia: Secondary | ICD-10-CM

## 2015-06-15 DIAGNOSIS — G903 Multi-system degeneration of the autonomic nervous system: Secondary | ICD-10-CM

## 2015-06-15 DIAGNOSIS — N170 Acute kidney failure with tubular necrosis: Secondary | ICD-10-CM

## 2015-06-15 LAB — GLUCOSE, CAPILLARY
GLUCOSE-CAPILLARY: 155 mg/dL — AB (ref 65–99)
GLUCOSE-CAPILLARY: 124 mg/dL — AB (ref 65–99)
GLUCOSE-CAPILLARY: 177 mg/dL — AB (ref 65–99)
Glucose-Capillary: 106 mg/dL — ABNORMAL HIGH (ref 65–99)
Glucose-Capillary: 99 mg/dL (ref 65–99)

## 2015-06-15 MED ORDER — LOSARTAN POTASSIUM 50 MG PO TABS
100.0000 mg | ORAL_TABLET | Freq: Every day | ORAL | Status: DC
  Administered 2015-06-15: 100 mg via ORAL
  Filled 2015-06-15: qty 2

## 2015-06-15 MED ORDER — VITAMINS A & D EX OINT
TOPICAL_OINTMENT | CUTANEOUS | Status: AC
  Filled 2015-06-15: qty 5

## 2015-06-15 MED ORDER — PANTOPRAZOLE SODIUM 40 MG PO PACK
40.0000 mg | PACK | Freq: Every day | ORAL | Status: DC
  Filled 2015-06-15: qty 20

## 2015-06-15 MED ORDER — AZITHROMYCIN 200 MG/5ML PO SUSR: 250.0000 mg | Freq: Every day | ORAL | Status: DC

## 2015-06-15 MED ORDER — CEFDINIR 125 MG/5ML PO SUSR: 300.0000 mg | Freq: Two times a day (BID) | ORAL | Status: DC

## 2015-06-15 NOTE — Progress Notes (Signed)
PHARMACIST - PHYSICIAN COMMUNICATION  CONCERNING: IV to Oral Route Change Policy  RECOMMENDATION: This patient is receiving Protonix by the intravenous route.  Based on criteria approved by the Pharmacy and Therapeutics Committee, the intravenous medication(s) is/are being converted to the equivalent oral dose form(s) for administration via tube.   DESCRIPTION: These criteria include:  The patient is eating (either orally or via tube) and/or has been taking other orally administered medications for a least 24 hours  The patient has no evidence of active gastrointestinal bleeding or impaired GI absorption (gastrectomy, short bowel, patient on TNA or NPO).  If you have questions about this conversion, please contact the Pharmacy Department  [x]   [phone] )  [facility] PharmD, [place] Pager [phone] 06/15/2015 7:41 AM

## 2015-06-15 NOTE — Discharge Summary (Signed)
Physician Discharge Summary       Patient ID: Nicholas Cobb MRN: [id number] DOB/AGE: [birth year] 65 y.o.  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 male with PMHx of COPD, osteoradionecrosis of jaw, DM, HTN and throat cancer who presents to the Emergency Department via EMS complaining of difficulty breathing onset tonight. He reportedly removed his own trach (6 cuffless) due to respiratory distress. In the ED, RT attempted to replace cuffless trach, difficulty placing, O2 sats dropped, patient began bleeding. ETT was inserted through the ostomy site, oxygenation improved. Finally 6 cuffed shiley was placed by ED physician. Was admitted to the PCCM service on the mechanical ventilator w/ working dx of CAP. He was supported on the vent, sputum and blood cultures were sent, empiric antibiotics were started. As he woke up, he was weaned off the vent. Cultures were negative at time of dc. We changed him back to his #6 proximal XLT on 5/1. Since this point he has continued to improve. He did have episode of hypertension on 5/1 to 5/2 in the AM w/ SBP in 230s. At this time he was not on his regular medicines. He was placed on a Nicardipine gtt, his regular regimen was later resumed (norvasc 10mg /day) and he was weaned off. He felt at baseline as of 5/2 and was deemed ready for dc with the following plan of care as outlined below.    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  Will go home w/ 2 more days azith and 5 more days omnicef  ROV trach clinic 3 months F/u w/ ENT as needed (see bates)  Chronic hypertension Plan Resume home rx   GERD Chronic PEG/enteral feeding Plan:  Resume home rx   DM  Plan:  Resume home rx     Significant Hospital tests/ studies  Consults   Discharge Exam: BP 171/91 mmHg  Pulse 71  Temp(Src) 98.9 F (37.2 C) (Oral)  Resp 18  Ht 5\' 9"  (1.753 m)  Wt 176 lb 2.4 oz (79.9 kg)  BMI 26.00 kg/m2  SpO2 100%  HENT: massive swelling over left jaw, trach in place-->this is his baseline for last several months  PULM CTA B, no accessory use  CV: RRR, no mgr GI: PEG tube, soft, nontender MSK: normal bulk and tone Neuro: awake, following commands  Labs at discharge Lab Results  Component Value Date   CREATININE 0.88 06/14/2015   BUN 12 06/14/2015   NA 136 06/14/2015   K 3.9 06/14/2015   CL 101 06/14/2015   CO2 26 06/14/2015   Lab Results  Component Value Date   WBC 6.6 06/14/2015   HGB 13.7 06/14/2015   HCT 40.5 06/14/2015   MCV 92.3 06/14/2015   PLT 204 06/14/2015   Lab Results  Component Value Date   ALT 27 06/13/2015   AST 37 06/13/2015   ALKPHOS 113 06/13/2015   BILITOT 0.4 06/13/2015   No results found for: INR, PROTIME  Current radiology studies Dg Chest Port 1 View  06/14/2015  CLINICAL DATA:  Community acquired pneumonia. History of emphysema. EXAM: PORTABLE CHEST 1 VIEW COMPARISON:  06/13/2015. FINDINGS: Tracheostomy in good position. Previously placed RIGHT mainstem bronchus tube has been removed. Normal cardiomediastinal silhouette. Mild vascular congestion without consolidation. No pneumothorax. IMPRESSION: Slight worsening aeration.  Mild vascular congestion. No evidence for additional tubes placed through tracheostomy at this time. Electronically Signed   By: Staci Righter M.D.   On: 06/14/2015 07:33    Disposition:  01-Home or Self Care      Discharge Instructions    Diet - low  sodium heart healthy    Complete by:  As directed      Increase activity slowly    Complete by:  As directed             Medication List    TAKE these medications        12 HOUR NASAL SPRAY 0.05 % nasal spray  Generic drug:  oxymetazoline  Place 2 sprays into the nose 2 (two) times daily as needed for congestion.     azithromycin 200 MG/5ML suspension  Commonly known as:  ZITHROMAX  Place 6.3 mLs (250 mg total) into feeding tube daily.     cefdinir 125 MG/5ML suspension  Commonly known as:  OMNICEF  Place 12 mLs (300 mg total) into feeding tube 2 (two) times daily.     cholestyramine light 4 g packet  Commonly known as:  PREVALITE  Take 1 packet (4 g total) by mouth 3 (three) times daily.     diphenoxylate-atropine 2.5-0.025 MG tablet  Commonly known as:  LOMOTIL  Take 1 tablet by mouth 4 (four) times daily as needed for diarrhea or loose stools.     feeding supplement (JEVITY 1.2 CAL) Liqd  Place 237 mLs into feeding tube See admin instructions. Pt takes 2 cans at breakfast, 2 cans at lunch, 2 cans at dinner and 2 at night     hydrOXYzine 25 MG capsule  Commonly known as:  VISTARIL  Take 1 capsule (25 mg total) by mouth 3 (three) times daily as needed.     ibuprofen 400 MG tablet  Commonly known as:  ADVIL,MOTRIN  Take one tablet by mouth three times daily as needed for pain     losartan 100 MG tablet  Commonly known as:  COZAAR  Take 1 tablet (100 mg total) by mouth daily.     naphazoline-pheniramine 0.025-0.3 % ophthalmic solution  Commonly known as:  NAPHCON-A  Place 2 drops into both eyes 4 (four) times daily as needed for irritation (red eye).     ranitidine 150 MG tablet  Commonly known as:  ZANTAC  Take 1 tablet (150 mg total) by mouth 2 (two) times daily.     tamsulosin 0.4 MG Caps capsule  Commonly known as:  FLOMAX  Take 1 capsule (0.4 mg total) by mouth daily.     triamcinolone cream 0.1 %  Commonly known as:  KENALOG  Apply 1 application  topically 2 (two) times daily.         Discharged Condition: good  Physician Statement:   The Patient was personally examined, the discharge assessment and plan has been personally reviewed and I agree with ACNP Merrick Feutz's assessment and plan. > 30 minutes of time have been dedicated to discharge assessment, planning and discharge instructions.   Signed: Clementeen Graham 06/15/2015, 12:58 PM

## 2015-06-16 ENCOUNTER — Ambulatory Visit (HOSPITAL_COMMUNITY): Payer: Medicare Other

## 2015-06-17 LAB — CULTURE, RESPIRATORY W GRAM STAIN

## 2015-06-18 LAB — CULTURE, BLOOD (ROUTINE X 2)
Culture: NO GROWTH
Culture: NO GROWTH

## 2015-06-23 ENCOUNTER — Encounter (HOSPITAL_COMMUNITY): Payer: Self-pay | Admitting: Emergency Medicine

## 2015-06-23 ENCOUNTER — Emergency Department (HOSPITAL_COMMUNITY)
Admission: EM | Admit: 2015-06-23 | Discharge: 2015-06-23 | Disposition: A | Discharge status: Home / Self Care | Payer: Medicare Other | Attending: Emergency Medicine | Admitting: Emergency Medicine

## 2015-06-23 ENCOUNTER — Emergency Department (HOSPITAL_COMMUNITY): Payer: Medicare Other

## 2015-06-23 DIAGNOSIS — M47812 Spondylosis without myelopathy or radiculopathy, cervical region: Secondary | ICD-10-CM | POA: Insufficient documentation

## 2015-06-23 DIAGNOSIS — Z87891 Personal history of nicotine dependence: Secondary | ICD-10-CM | POA: Insufficient documentation

## 2015-06-23 DIAGNOSIS — Z430 Encounter for attention to tracheostomy: Secondary | ICD-10-CM | POA: Diagnosis not present

## 2015-06-23 DIAGNOSIS — I10 Essential (primary) hypertension: Secondary | ICD-10-CM | POA: Diagnosis not present

## 2015-06-23 DIAGNOSIS — E119 Type 2 diabetes mellitus without complications: Secondary | ICD-10-CM | POA: Diagnosis not present

## 2015-06-23 DIAGNOSIS — Z792 Long term (current) use of antibiotics: Secondary | ICD-10-CM | POA: Insufficient documentation

## 2015-06-23 DIAGNOSIS — Z79899 Other long term (current) drug therapy: Secondary | ICD-10-CM | POA: Diagnosis not present

## 2015-06-23 DIAGNOSIS — Z8589 Personal history of malignant neoplasm of other organs and systems: Secondary | ICD-10-CM | POA: Diagnosis not present

## 2015-06-23 DIAGNOSIS — Z791 Long term (current) use of non-steroidal anti-inflammatories (NSAID): Secondary | ICD-10-CM | POA: Diagnosis not present

## 2015-06-23 DIAGNOSIS — F329 Major depressive disorder, single episode, unspecified: Secondary | ICD-10-CM | POA: Diagnosis not present

## 2015-06-23 DIAGNOSIS — K219 Gastro-esophageal reflux disease without esophagitis: Secondary | ICD-10-CM | POA: Insufficient documentation

## 2015-06-23 NOTE — ED Provider Notes (Signed)
CSN: [id number]     Arrival date & time 06/23/15  0031 History  By signing my name below, I, Rowan Blase, attest that this documentation has been prepared under the direction and in the presence of [name], MD . Electronically Signed: Rowan Blase, Scribe. 06/23/2015. 1:15 AM.   Chief Complaint  Patient presents with  . Tracheostomy Tube Change   The history is provided by the patient. No language interpreter was used.   HPI Comments:  Nicholas Cobb is a 65 y.o. male with PMHx of osteoradionecrosis of jaw, DM and tracheostomy who presents to the Emergency Department seeking tracheostomy tube replacement. Pt has an extensive history of head and neck cancer. He was seen and evaluated last Saturday after pulling out his trach and had a respiratory arrest. He was admitted, treated for pneumonia and discharged. Pt reports difficulty breathing tonight with the trach placed 8 days ago, so he removed it 2 hours ago. He states he feels like something was blocking it. He has tried cleaning the trach at home and said it is not clogged.  Past Medical History  Diagnosis Date  . Hearing loss   . Change in voice   . Rash   . Trouble swallowing   . Difficulty urinating   . Cervical spondylosis 03/25/2011  . Emphysema 03/25/2011  . GERD (gastroesophageal reflux disease) 03/25/2011  . Chronic pain 03/25/2011  . 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 Xerostomia 03/25/2011  . Ulcer   . UTI (lower urinary tract infection)   . Allergy   . Chronic sinusitis 04/27/2011  . Impaired glucose tolerance 04/27/2011  . Cervical spondylolysis 04/30/2011    severe  . Cancer ([place]) 1999    throat cancer  . Essential hypertension, benign 12/03/2012  . Type II or unspecified type diabetes mellitus without mention of complication, uncontrolled 04/27/2011  . Depression    Past Surgical History  Procedure Laterality Date  . Peg tube placement  11/1999  . Tracheostomy  11/1999  . Gastrostomy tube placement      Family History  Problem Relation Age of Onset  . Stroke Other   . Diabetes Other   . Cancer Other     lung cancer  . Diabetes Mother   . Heart disease Father    Social History  Substance Use Topics  . Smoking status: Former Smoker -- 23 years    Quit date: 11/06/1996  . Smokeless tobacco: Never Used  . Alcohol Use: No    Review of Systems  Constitutional: Negative for fever.  Respiratory: Positive for shortness of breath.   All other systems reviewed and are negative.  Allergies  Oxybutynin chloride and Codeine  Home Medications   Prior to Admission medications   Medication Sig Start Date End Date Taking? Authorizing Provider  azithromycin (ZITHROMAX) 200 MG/5ML suspension Place 6.3 mLs (250 mg total) into feeding tube daily. 06/15/15   [name], NP  cefdinir (OMNICEF) 125 MG/5ML suspension Place 12 mLs (300 mg total) into feeding tube 2 (two) times daily. 06/15/15   [name], NP  cholestyramine light (PREVALITE) 4 G packet Take 1 packet (4 g total) by mouth 3 (three) times daily. 01/06/15   Gildardo Cranker, DO  diphenoxylate-atropine (LOMOTIL) 2.5-0.025 MG tablet Take 1 tablet by mouth 4 (four) times daily as needed for diarrhea or loose stools. 12/02/14   Gildardo Cranker, DO  hydrOXYzine (VISTARIL) 25 MG capsule Take 1 capsule (25 mg total) by mouth 3 (three) times daily as  needed. 03/04/15   [name], NP  ibuprofen (ADVIL,MOTRIN) 400 MG tablet Take one tablet by mouth three times daily as needed for pain 04/21/15   [name], NP  losartan (COZAAR) 100 MG tablet Take 1 tablet (100 mg total) by mouth daily. 12/02/14   Monica Carter, DO  naphazoline-pheniramine (NAPHCON-A) 0.025-0.3 % ophthalmic solution Place 2 drops into both eyes 4 (four) times daily as needed for irritation (red eye).    Historical Provider, MD  Nutritional Supplements (FEEDING SUPPLEMENT, JEVITY 1.2 CAL,) LIQD Place 237 mLs into feeding tube See admin instructions. Pt takes 2 cans at  breakfast, 2 cans at lunch, 2 cans at dinner and 2 at night 12/02/14   Gildardo Cranker, DO  oxymetazoline (12 HOUR NASAL SPRAY) 0.05 % nasal spray Place 2 sprays into the nose 2 (two) times daily as needed for congestion.     Historical Provider, MD  ranitidine (ZANTAC) 150 MG tablet Take 1 tablet (150 mg total) by mouth 2 (two) times daily. 05/17/15   Tiffany L Reed, DO  tamsulosin (FLOMAX) 0.4 MG CAPS capsule Take 1 capsule (0.4 mg total) by mouth daily. 08/06/14   [name], NP  triamcinolone cream (KENALOG) 0.1 % Apply 1 application topically 2 (two) times daily. 02/17/15   [name], NP   BP 161/103 mmHg  Pulse 92  Temp(Src) 99.3 F (37.4 C) (Oral)  Resp 20  Ht 5\' 7"  (1.702 m)  Wt 180 lb (81.647 kg)  BMI 28.19 kg/m2  SpO2 98% Physical Exam  Constitutional: He is oriented to person, place, and time. No distress.  Chronically ill-appearing, no acute distress  HENT:  Mass and facial swelling noted mostly over the right face  Eyes: Pupils are equal, round, and reactive to light.  Neck:  Stoma intact, no significant erythema or bleeding noted  Cardiovascular: Normal rate, regular rhythm and normal heart sounds.   No murmur heard. Pulmonary/Chest: Effort normal. He has no wheezes. He has rales.  Abdominal: There is no rebound.  Musculoskeletal: He exhibits no edema.  Neurological: He is alert and oriented to person, place, and time.  Skin: Skin is warm and dry.  Psychiatric: He has a normal mood and affect.  Nursing note and vitals reviewed.   ED Course  TRACHEOSTOMY REPLACEMENT Date/Time: 06/23/2015 1:55 AM Performed by: [name] Authorized by: [name] Consent: Verbal consent obtained. Risks and benefits: risks, benefits and alternatives were discussed Consent given by: patient Patient identity confirmed: verbally with patient Time out: Immediately prior to procedure a "time out" was called to verify the correct patient, procedure, equipment,  support staff and site/side marked as required. Indications: became dislodged Local anesthesia used: no Patient sedated: no Tube cuff: cuffless Tube size: 6.0 mm Patient tolerance: Patient tolerated the procedure well with no immediate complications    DIAGNOSTIC STUDIES:  Oxygen Saturation is 100% on RA, normal by my interpretation.    COORDINATION OF CARE:  1:03 AM Will replace trach. Discussed treatment plan with pt at bedside and pt agreed to plan.  1:11 AM Reinserted tach. Pt denies difficulty breathing at this time.  Labs Review Labs Reviewed - No data to display  Imaging Review Dg Chest Portable 1 View  06/23/2015  CLINICAL DATA:  65 year old male with tracheostomy change. EXAM: PORTABLE CHEST 1 VIEW COMPARISON:  Radiograph dated 06/14/2015 FINDINGS: A tracheostomy is noted with tip above the carina. The lungs are clear. No pleural effusion or pneumothorax. The cardiac silhouette is  within normal limits. Right mandibular fixation hardware. No acute fracture. IMPRESSION: Tracheostomy above the carina. No acute cardiopulmonary process. Electronically Signed   By: Anner Crete M.D.   On: 06/23/2015 01:45   I have personally reviewed and evaluated these images and lab results as part of my medical decision-making.   EKG Interpretation None      MDM   Final diagnoses:  Tracheostomy care [facility])    Patient removed his tracheostomy as he felt like something was clotted again. He is nontoxic on exam. Was recently admitted for pneumonia and hypoxia secondary to removing his trach. Lurline Idol was replaced without incident. X-ray confirms appropriate placement. Patient is at his baseline. Follow-up with the ENT.  After history, exam, and medical workup I feel the patient has been appropriately medically screened and is safe for discharge home. Pertinent diagnoses were discussed with the patient. Patient was given return precautions.  I personally performed the services described  in this documentation, which was scribed in my presence. The recorded information has been reviewed and is accurate.    [name], MD 06/23/15 0157

## 2015-06-23 NOTE — Discharge Instructions (Signed)
Care of a Tracheostomy Tube  Having a clean tracheostomy tube (trach tube) helps stop infections and keeps tubing from clogging. Follow your doctor's directions for how often someone should change and clean your trach tube. SUPPLIES YOU NEED TO CARE FOR YOUR TRACH TUBE  Towel.  Suction supplies.  Germ-free (sterile) trach care kit.  4x4 inch (10x10 cm) gauze pads.  Cotton swabs.  Trach bandage (dressing).  Germ-free bowl.  0.9% salt-water (saline) solution.  Small brush (or an inner trach tube that can be thrown away [disposable inner cannula]).  Roll of twill tape, trach ties, or trach holder.  Scissors.  Clean gloves.  Germ-free gloves. TRACH CARE 1. Have all supplies ready. 2. Wash hands well. 3. Put on clean gloves. 4. Suction the trach tube as needed. 5. After the suctioning is done, throw away the dirty bandages, gloves, and suction tube (catheter). 6. Wash hands well. 7. Put on the germ-free gloves. 8. Fill the bowl with the salt water. 9. Give oxygen as needed. 10. Clean the tube that is inside the trach tube. This tube is called an inner cannula. For inner cannulas that you cannot throw away:  Unlock and remove the inner cannula.  Drop the inner cannula into the bowl of salt water.  Replace the trach collar, trach tube, or oxygen source over the outer cannula. Do not attach the trach tube and oxygen devices to the outer cannula when the inner cannula is off.  Pick up the inner cannula out of the salt water. Scrub the inside and outside of the inner cannula with the small brush.  Hold the inner cannula over the bowl. Rinse the inner cannula with more salt water.  Replace the inner cannula. Lock it in place.  Give oxygen as needed. For inner cannulas that you can throw away:  Take the new cannula out of the package.  Take out the inner cannula. Only touch the outer part of the trach tube.  Insert the new cannula.  Lock the new cannula in  place.  Throw away the old cannula. 11. Clean the outer cannula and under the neck plate using gauze pads or cotton swabs. Clean the stoma site in a circular motion using a cotton swab. Clean from the edge of the stoma outward. 12. Dry the skin and outer cannula with a dry gauze pad. Pat the areas dry. 13. Secure the trach tube with trach ties or a trach tube holder. 14. Put a germ-free bandage around the trach site. 15. Give oxygen as needed. 16. Throw away used supplies. 17. Take off the gloves. 18. Wash hands well.   This information is not intended to replace advice given to you by your health care provider. Make sure you discuss any questions you have with your health care provider.   Document Released: 10/25/2011 Document Revised: 02/04/2013 Document Reviewed: 10/25/2011 Elsevier Interactive Patient Education [employer].

## 2015-06-23 NOTE — ED Notes (Signed)
[facility] here for transport of pt back to residence.  

## 2015-06-23 NOTE — ED Notes (Signed)
Pt reports that he has been having difficulty breathing with trach that was placed on 06/15/15 and has tried to clean it but was having issues with breathing. Pt alert and sitting in chair without any acute distress noted at this time.

## 2015-06-23 NOTE — Progress Notes (Signed)
Approximately 0040, Rn notified me of pt in ED15 with trach out.  Upon arrival to pt bedside, pt stated that he couldn't breathe earlier and removed trach about 2300.  Pt was not found in any respiratory distress, on room air, spo2 98-100%.  MD was notified and RT assisted with replacement of same size/type trach (6xl cuffless).  MD replaced trach using bougie with little difficulty.  Positive etco2 color change noted.  Pt tolerated well.  Trach secured, pt suctioned for small amount of pink-tinged secretions then placed on 28% atc.  RT will continue to monitor and assess as needed.

## 2015-06-23 NOTE — ED Notes (Signed)
[employer] notified for need of transport back to residence.

## 2015-06-24 ENCOUNTER — Other Ambulatory Visit: Payer: Self-pay | Admitting: Internal Medicine

## 2015-06-25 ENCOUNTER — Other Ambulatory Visit: Payer: Self-pay | Admitting: *Deleted

## 2015-06-25 MED ORDER — TRIAMCINOLONE ACETONIDE 0.1 % EX CREA: 1.0000 "application " | TOPICAL_CREAM | Freq: Two times a day (BID) | CUTANEOUS | Status: DC

## 2015-06-25 NOTE — Telephone Encounter (Signed)
[facility] 

## 2015-06-28 ENCOUNTER — Other Ambulatory Visit: Payer: Self-pay | Admitting: *Deleted

## 2015-06-28 MED ORDER — IBUPROFEN 400 MG PO TABS: ORAL_TABLET | ORAL | Status: DC

## 2015-06-28 NOTE — Telephone Encounter (Signed)
[facility] 

## 2015-07-02 ENCOUNTER — Ambulatory Visit (INDEPENDENT_AMBULATORY_CARE_PROVIDER_SITE_OTHER): Payer: Medicare Other | Admitting: Internal Medicine

## 2015-07-02 ENCOUNTER — Encounter: Payer: Self-pay | Admitting: Internal Medicine

## 2015-07-02 VITALS — BP 140/80 | HR 80 | Temp 98.8°F | Wt 166.0 lb

## 2015-07-02 DIAGNOSIS — I10 Essential (primary) hypertension: Secondary | ICD-10-CM | POA: Diagnosis not present

## 2015-07-02 DIAGNOSIS — Z930 Tracheostomy status: Secondary | ICD-10-CM | POA: Diagnosis not present

## 2015-07-02 DIAGNOSIS — Z931 Gastrostomy status: Secondary | ICD-10-CM | POA: Diagnosis not present

## 2015-07-02 DIAGNOSIS — C329 Malignant neoplasm of larynx, unspecified: Secondary | ICD-10-CM

## 2015-07-02 DIAGNOSIS — Y842 Radiological procedure and radiotherapy as the cause of abnormal reaction of the patient, or of later complication, without mention of misadventure at the time of the procedure: Secondary | ICD-10-CM

## 2015-07-02 DIAGNOSIS — M272 Inflammatory conditions of jaws: Secondary | ICD-10-CM | POA: Diagnosis not present

## 2015-07-02 DIAGNOSIS — J189 Pneumonia, unspecified organism: Secondary | ICD-10-CM | POA: Diagnosis not present

## 2015-07-02 DIAGNOSIS — I890 Lymphedema, not elsewhere classified: Secondary | ICD-10-CM

## 2015-07-02 MED ORDER — IBUPROFEN 400 MG PO TABS: ORAL_TABLET | ORAL | Status: DC

## 2015-07-02 MED ORDER — TRIAMCINOLONE ACETONIDE 0.1 % EX CREA: 1.0000 "application " | TOPICAL_CREAM | Freq: Two times a day (BID) | CUTANEOUS | Status: DC

## 2015-07-02 NOTE — Patient Instructions (Signed)
Continue current medications as ordered  Will call [place] regarding supplies for trach and Peg tube  Follow up with ENT and trach clinic as scheduled  Follow up as scheduled

## 2015-07-02 NOTE — Progress Notes (Signed)
Location:    [facility]   Place of Service:  OFFICE   Chief Complaint  Patient presents with  . Hospitalization Follow-up    HPI:  65 yo male seen today for hospital f/u for acute hypoxemic respiratory failure, CAP, trach dependence/osteoradionecrosis of jaw, HTN, GERD, chronic PEG with TF, DM. He presented to the ED after removing cuffless trach at home due to respiratory distress. In the ED, RT had difficult time replacing trach and he began to desat and he began to bleed from ostomy site. ETT had to be inserted to improve oxygenation. He was sent to ICU on vent. W/u revealed neg sputum/blood cx. No issues weaning him from vent. BP was greatly elevated and he had to be placed on nicardipine gtt-->gradullly tapered to home regimen. tx with IV abx changed to po azithromycin and po omnicef at d/c. Once d/c'd he returned to ED on 5/10th c/o difficulty breathing which caused him to remove trach to clean at home. It was not clogged per pt. Trach replaced in ED and he was sent back home.  He saw Dr Redmond Baseman in march 2017. No CP/SOB. He continues to be dependent on others for transportation and care. He uses TF and trach supplies. [place] agency is [facility]. He would like to change to [facility] if possible    Past Medical History  Diagnosis Date  . Hearing loss   . Change in voice   . Rash   . Trouble swallowing   . Difficulty urinating   . Cervical spondylosis 03/25/2011  . Emphysema 03/25/2011  . GERD (gastroesophageal reflux disease) 03/25/2011  . Chronic pain 03/25/2011  . 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 Xerostomia 03/25/2011  . Ulcer   . UTI (lower urinary tract infection)   . Allergy   . Chronic sinusitis 04/27/2011  . Impaired glucose tolerance 04/27/2011  . Cervical spondylolysis 04/30/2011    severe  . Cancer ([place]) 1999    throat cancer  . Essential hypertension, benign 12/03/2012  . Type II or unspecified type diabetes mellitus without mention of complication, uncontrolled  04/27/2011  . Depression     Past Surgical History  Procedure Laterality Date  . Peg tube placement  11/1999  . Tracheostomy  11/1999  . Gastrostomy tube placement      Patient Care Team: [name], NP as PCP - General (Nurse Practitioner)  Social History   Social History  . Marital Status: Single    Spouse Name: N/A  . Number of Children: N/A  . Years of Education: 12   Occupational History  . retired    Social History Main Topics  . Smoking status: Former Smoker -- 23 years    Quit date: 11/06/1996  . Smokeless tobacco: Never Used  . Alcohol Use: No  . Drug Use: No  . Sexual Activity: Not on file   Other Topics Concern  . Not on file   Social History Narrative   Pt. Lives in a apartment, 1 story, 2 people live in the home, No pet's. Pt. Does exercise (walk) and yes pt. Has a living will.     reports that he quit smoking about 18 years ago. He has never used smokeless tobacco. He reports that he does not drink alcohol or use illicit drugs.  Allergies  Allergen Reactions  . Oxybutynin Chloride Nausea And Vomiting  . Codeine Nausea And Vomiting and Rash    Medications: Patient's Medications  New Prescriptions   No medications on  file  Previous Medications   AZITHROMYCIN (ZITHROMAX) 200 MG/5ML SUSPENSION    Place 6.3 mLs (250 mg total) into feeding tube daily.   CEFDINIR (OMNICEF) 125 MG/5ML SUSPENSION    Place 12 mLs (300 mg total) into feeding tube 2 (two) times daily.   CHOLESTYRAMINE LIGHT (PREVALITE) 4 G PACKET    Take 1 packet (4 g total) by mouth 3 (three) times daily.   DIPHENOXYLATE-ATROPINE (LOMOTIL) 2.5-0.025 MG TABLET    Take 1 tablet by mouth 4 (four) times daily as needed for diarrhea or loose stools.   HYDROXYZINE (VISTARIL) 25 MG CAPSULE    Take 1 capsule (25 mg total) by mouth 3 (three) times daily as needed.   IBUPROFEN (ADVIL,MOTRIN) 400 MG TABLET    Take one tablet by mouth three times daily as needed for pain   LOSARTAN (COZAAR) 100  MG TABLET    take 1 tablet by mouth once daily   NAPHAZOLINE-PHENIRAMINE (NAPHCON-A) 0.025-0.3 % OPHTHALMIC SOLUTION    Place 2 drops into both eyes 4 (four) times daily as needed for irritation (red eye).   NUTRITIONAL SUPPLEMENTS (FEEDING SUPPLEMENT, JEVITY 1.2 CAL,) LIQD    Place 237 mLs into feeding tube See admin instructions. Pt takes 2 cans at breakfast, 2 cans at lunch, 2 cans at dinner and 2 at night   OXYMETAZOLINE (12 HOUR NASAL SPRAY) 0.05 % NASAL SPRAY    Place 2 sprays into the nose 2 (two) times daily as needed for congestion.    RANITIDINE (ZANTAC) 150 MG TABLET    Take 1 tablet (150 mg total) by mouth 2 (two) times daily.   TAMSULOSIN (FLOMAX) 0.4 MG CAPS CAPSULE    Take 1 capsule (0.4 mg total) by mouth daily.   TRIAMCINOLONE CREAM (KENALOG) 0.1 %    Apply 1 application topically 2 (two) times daily.  Modified Medications   No medications on file  Discontinued Medications   No medications on file    Review of Systems  Unable to perform ROS: Patient nonverbal    Filed Vitals:   07/02/15 1328  BP: 140/80  Pulse: 80  Temp: 98.8 F (37.1 C)  TempSrc: Oral  Weight: 166 lb (75.297 kg)  SpO2: 93%   Body mass index is 25.99 kg/(m^2).  Physical Exam  Constitutional: He appears well-developed.  Frail appearing in NAD  HENT:  Head:    Unable to open mouth  Eyes: Pupils are equal, round, and reactive to light. No scleral icterus.  Neck: Neck supple. Carotid bruit is not present. No thyromegaly present.  Trach present. No d/c or redness.  Cardiovascular: Regular rhythm and intact distal pulses.  Tachycardia present.  Exam reveals no gallop and no friction rub.   Murmur (1/6 SEM) heard. Trace LE edema b/l. No calf TTP  Pulmonary/Chest: Effort normal and breath sounds normal. No stridor. He has no wheezes. He has no rales. He exhibits no tenderness.  Abdominal: Soft. Bowel sounds are normal. He exhibits no distension, no abdominal bruit, no pulsatile midline mass and no  mass. There is no tenderness. There is no rebound and no guarding.    Musculoskeletal: He exhibits edema and tenderness.  Lymphadenopathy:    He has no cervical adenopathy.  Neurological: He is alert.  Skin: Skin is warm and dry. No rash noted.  Psychiatric: His affect is angry. He is agitated. He is noncommunicative.     Labs reviewed: Admission on 06/13/2015, Discharged on 06/15/2015  Component Date Value Ref Range Status  . WBC  06/13/2015 9.1  4.0 - 10.5 K/uL Final  . RBC 06/13/2015 4.75  4.22 - 5.81 MIL/uL Final  . Hemoglobin 06/13/2015 14.7  13.0 - 17.0 g/dL Final  . HCT 06/13/2015 44.4  39.0 - 52.0 % Final  . MCV 06/13/2015 93.5  78.0 - 100.0 fL Final  . MCH 06/13/2015 30.9  26.0 - 34.0 pg Final  . MCHC 06/13/2015 33.1  30.0 - 36.0 g/dL Final  . RDW 06/13/2015 12.8  11.5 - 15.5 % Final  . Platelets 06/13/2015 253  150 - 400 K/uL Final  . Neutrophils Relative % 06/13/2015 45   Final  . Neutro Abs 06/13/2015 4.1  1.7 - 7.7 K/uL Final  . Lymphocytes Relative 06/13/2015 36   Final  . Lymphs Abs 06/13/2015 3.3  0.7 - 4.0 K/uL Final  . Monocytes Relative 06/13/2015 17   Final  . Monocytes Absolute 06/13/2015 1.5* 0.1 - 1.0 K/uL Final  . Eosinophils Relative 06/13/2015 2   Final  . Eosinophils Absolute 06/13/2015 0.2  0.0 - 0.7 K/uL Final  . Basophils Relative 06/13/2015 0   Final  . Basophils Absolute 06/13/2015 0.0  0.0 - 0.1 K/uL Final  . Smear Review 06/13/2015 MORPHOLOGY UNREMARKABLE   Final  . Sodium 06/13/2015 134* 135 - 145 mmol/L Final  . Potassium 06/13/2015 4.0  3.5 - 5.1 mmol/L Final  . Chloride 06/13/2015 99* 101 - 111 mmol/L Final  . CO2 06/13/2015 21* 22 - 32 mmol/L Final  . Glucose, Bld 06/13/2015 202* 65 - 99 mg/dL Final  . BUN 06/13/2015 17  6 - 20 mg/dL Final  . Creatinine, Ser 06/13/2015 1.27* 0.61 - 1.24 mg/dL Final  . Calcium 06/13/2015 8.8* 8.9 - 10.3 mg/dL Final  . Total Protein 06/13/2015 7.8  6.5 - 8.1 g/dL Final  . Albumin 06/13/2015 4.2  3.5 -  5.0 g/dL Final  . AST 06/13/2015 37  15 - 41 U/L Final  . ALT 06/13/2015 27  17 - 63 U/L Final  . Alkaline Phosphatase 06/13/2015 113  38 - 126 U/L Final  . Total Bilirubin 06/13/2015 0.4  0.3 - 1.2 mg/dL Final  . GFR calc non Af Amer 06/13/2015 57* >60 mL/min Final  . GFR calc Af Amer 06/13/2015 >60  >60 mL/min Final   Comment: (NOTE) The eGFR has been calculated using the CKD EPI equation. This calculation has not been validated in all clinical situations. eGFR's persistently <60 mL/min signify possible Chronic Kidney Disease.   . Anion gap 06/13/2015 14  5 - 15 Final  . Troponin I 06/13/2015 <0.03  <0.031 ng/mL Final   Comment:        NO INDICATION OF MYOCARDIAL INJURY.   . B Natriuretic Peptide 06/13/2015 99.7  0.0 - 100.0 pg/mL Final  . Triglycerides 06/13/2015 120  <150 mg/dL Final   Performed at [facility]  . FIO2 06/13/2015 1.00   Final  . Delivery systems 06/13/2015 VENTILATOR   Final  . Mode 06/13/2015 PRESSURE REGULATED VOLUME CONTROL   Final  . VT 06/13/2015 600   Final  . LHR 06/13/2015 20   Final  . Peep/cpap 06/13/2015 5.0   Final  . pH, Arterial 06/13/2015 7.338* 7.350 - 7.450 Final  . pCO2 arterial 06/13/2015 42.4  35.0 - 45.0 mmHg Final  . pO2, Arterial 06/13/2015 323* 80.0 - 100.0 mmHg Final  . Bicarbonate 06/13/2015 22.3  20.0 - 24.0 mEq/L Final  . TCO2 06/13/2015 20.3  0 - 100 mmol/L Final  . Acid-base deficit  06/13/2015 3.0* 0.0 - 2.0 mmol/L Final  . O2 Saturation 06/13/2015 99.4   Final  . Patient temperature 06/13/2015 98.0   Final  . Collection site 06/13/2015 RIGHT RADIAL   Final  . Drawn by 06/13/2015 [id number]   Final  . Sample type 06/13/2015 ARTERIAL DRAW   Final  . Chauncey Reading test (pass/fail) 06/13/2015 PASS  PASS Final  . Color, Urine 06/13/2015 YELLOW  YELLOW Final  . APPearance 06/13/2015 CLEAR  CLEAR Final  . Specific Gravity, Urine 06/13/2015 1.015  1.005 - 1.030 Final  . pH 06/13/2015 7.0  5.0 - 8.0 Final  . Glucose, UA 06/13/2015  NEGATIVE  NEGATIVE mg/dL Final  . Hgb urine dipstick 06/13/2015 NEGATIVE  NEGATIVE Final  . Bilirubin Urine 06/13/2015 NEGATIVE  NEGATIVE Final  . Ketones, ur 06/13/2015 NEGATIVE  NEGATIVE mg/dL Final  . Protein, ur 06/13/2015 100* NEGATIVE mg/dL Final  . Nitrite 06/13/2015 NEGATIVE  NEGATIVE Final  . Leukocytes, UA 06/13/2015 NEGATIVE  NEGATIVE Final  . Magnesium 06/13/2015 2.0  1.7 - 2.4 mg/dL Final  . Phosphorus 06/13/2015 2.9  2.5 - 4.6 mg/dL Final  . Lipase 06/13/2015 26  11 - 51 U/L Final  . Lactic Acid, Venous 06/13/2015 2.2* 0.5 - 2.0 mmol/L Final   Comment: CRITICAL RESULT CALLED TO, READ BACK BY AND VERIFIED WITH: M MORGAN AT 0806 ON 04.30.2017 BY NBROOKS   . Procalcitonin 06/13/2015 <0.10   Final   Comment:        Interpretation: PCT (Procalcitonin) <= 0.5 ng/mL: Systemic infection (sepsis) is not likely. Local bacterial infection is possible. (NOTE)         ICU PCT Algorithm               Non ICU PCT Algorithm    ----------------------------     ------------------------------         PCT < 0.25 ng/mL                 PCT < 0.1 ng/mL     Stopping of antibiotics            Stopping of antibiotics       strongly encouraged.               strongly encouraged.    ----------------------------     ------------------------------       PCT level decrease by               PCT < 0.25 ng/mL       >= 80% from peak PCT       OR PCT 0.25 - 0.5 ng/mL          Stopping of antibiotics                                             encouraged.     Stopping of antibiotics           encouraged.    ----------------------------     ------------------------------       PCT level decrease by              PCT >= 0.25 ng/mL       < 80% from peak PCT        AND PCT >= 0.5 ng/mL            Continuin  g antibiotics                                              encouraged.       Continuing antibiotics            encouraged.    ----------------------------      ------------------------------     PCT level increase compared          PCT > 0.5 ng/mL         with peak PCT AND          PCT >= 0.5 ng/mL             Escalation of antibiotics                                          strongly encouraged.      Escalation of antibiotics        strongly encouraged.   Marland Kitchen Specimen Description 06/13/2015 BLOOD RIGHT HAND   Final  . Special Requests 06/13/2015 BOTTLES DRAWN AEROBIC AND ANAEROBIC Alexander   Final  . Culture 06/13/2015    Final                   Value:NO GROWTH 5 DAYS Performed at [facility]   . Report Status 06/13/2015 06/18/2015 FINAL   Final  . Specimen Description 06/13/2015 BLOOD RIGHT ANTECUBITAL   Final  . Special Requests 06/13/2015 BOTTLES DRAWN AEROBIC AND ANAEROBIC [place]ola   Final  . Culture 06/13/2015    Final                   Value:NO GROWTH 5 DAYS Performed at [facility]   . Report Status 06/13/2015 06/18/2015 FINAL   Final  . Specimen Description 06/14/2015 TRACHEAL ASPIRATE   Final  . Special Requests 06/14/2015 NONE   Final  . Gram Stain 06/14/2015    Final                   Value:MODERATE WBC PRESENT,BOTH PMN AND MONONUCLEAR RARE SQUAMOUS EPITHELIAL CELLS PRESENT FEW GRAM POSITIVE COCCI IN PAIRS Performed at [facility]   . Culture 06/14/2015    Final                   Value:FEW STAPHYLOCOCCUS AUREUS Note: RIFAMPIN AND GENTAMICIN SHOULD NOT BE USED AS SINGLE DRUGS FOR TREATMENT OF STAPH INFECTIONS. This organism is presumed to be Clindamycin resistant based on detection of inducible Clindamycin resistance. Performed at [facility]   . Report Status 06/14/2015 06/17/2015 FINAL   Final  . Organism ID, Bacteria 06/14/2015 STAPHYLOCOCCUS AUREUS   Final  . Strep Pneumo Urinary Antigen 06/13/2015 NEGATIVE  NEGATIVE Final   Comment:        Infection due to S. pneumoniae cannot be absolutely ruled out since the antigen present may be below the detection limit of the test. Performed at [facility]   . L. pneumophila Serogp 1 Ur Ag 06/13/2015 Negative  Negative Final   Comment: (NOTE) Presumptive negative for L. pneumophila serogroup 1 antigen in urine, suggesting no recent or current infection. Legionnaires' disease cannot be ruled out since other serogroups and species may also cause disease. Performed At: [facility]  [facility] [place], [place] [id number] Lindon Romp MD OA:[phone]   . Source of Sample 06/13/2015 URINE, RANDOM   Final  . WBC 06/13/2015 8.2  4.0 - 10.5 K/uL Final  . RBC 06/13/2015 4.09* 4.22 - 5.81 MIL/uL Final  . Hemoglobin 06/13/2015 12.7* 13.0 - 17.0 g/dL Final  . HCT 06/13/2015 37.0* 39.0 - 52.0 % Final  . MCV 06/13/2015 90.5  78.0 - 100.0 fL Final  . MCH 06/13/2015 31.1  26.0 - 34.0 pg Final  . MCHC 06/13/2015 34.3  30.0 - 36.0 g/dL Final  . RDW 06/13/2015 12.8  11.5 - 15.5 % Final  . Platelets 06/13/2015 186  150 - 400 K/uL Final  . Sodium 06/13/2015 135  135 - 145 mmol/L Final  . Potassium 06/13/2015 5.3* 3.5 - 5.1 mmol/L Final   Comment: RESULT REPEATED AND VERIFIED DELTA CHECK NOTED NO VISIBLE HEMOLYSIS   . Chloride 06/13/2015 104  101 - 111 mmol/L Final  . CO2 06/13/2015 21* 22 - 32 mmol/L Final  . Glucose, Bld 06/13/2015 95  65 - 99 mg/dL Final  . BUN 06/13/2015 17  6 - 20 mg/dL Final  . Creatinine, Ser 06/13/2015 0.99  0.61 - 1.24 mg/dL Final  . Calcium 06/13/2015 8.2* 8.9 - 10.3 mg/dL Final  . GFR calc non Af Amer 06/13/2015 >60  >60 mL/min Final  . GFR calc Af Amer 06/13/2015 >60  >60 mL/min Final   Comment: (NOTE) The eGFR has been calculated using the CKD EPI equation. This calculation has not been validated in all clinical situations. eGFR's persistently <60 mL/min signify possible Chronic Kidney Disease.   . Anion gap 06/13/2015 10  5 - 15 Final  . Respiratory Syncytial Virus A 06/13/2015 Negative  Negative Final  . Respiratory Syncytial Virus B 06/13/2015 Negative  Negative Final  . Influenza A  06/13/2015 Negative  Negative Final  . Influenza B 06/13/2015 Negative  Negative Final  . Parainfluenza 1 06/13/2015 Negative  Negative Final  . Parainfluenza 2 06/13/2015 Negative  Negative Final  . Parainfluenza 3 06/13/2015 Negative  Negative Final  . Metapneumovirus 06/13/2015 Negative  Negative Final  . Rhinovirus 06/13/2015 Negative  Negative Final  . Adenovirus 06/13/2015 Negative  Negative Final   Comment: (NOTE) Performed At: [facility] [place], [place] [id number] Lindon Romp MD DD:[phone]   . Squamous Epithelial / LPF 06/13/2015 0-5* NONE SEEN Final  . WBC, UA 06/13/2015 0-5  0 - 5 WBC/hpf Final  . RBC / HPF 06/13/2015 NONE SEEN  0 - 5 RBC/hpf Final  . Bacteria, UA 06/13/2015 NONE SEEN  NONE SEEN Final  . Casts 06/13/2015 HYALINE CASTS* NEGATIVE Final   GRANULAR CAST  . MRSA by PCR 06/13/2015 NEGATIVE  NEGATIVE Final   Comment:        The GeneXpert MRSA Assay (FDA approved for NASAL specimens only), is one component of a comprehensive MRSA colonization surveillance program. It is not intended to diagnose MRSA infection nor to guide or monitor treatment for MRSA infections.   . Glucose-Capillary 06/13/2015 91  65 - 99 mg/dL Final  . Glucose-Capillary 06/13/2015 84  65 - 99 mg/dL Final  . Glucose-Capillary 06/13/2015 121* 65 - 99 mg/dL Final  . Comment 1 06/13/2015 Notify RN   Final  . Comment 2 06/13/2015 Document in Chart   Final  . Sodium 06/14/2015 136  135 - 145 mmol/L Final  . Potassium 06/14/2015 3.9  3.5 - 5.1 mmol/L Final  Comment: DELTA CHECK NOTED REPEATED TO VERIFY   . Chloride 06/14/2015 101  101 - 111 mmol/L Final  . CO2 06/14/2015 26  22 - 32 mmol/L Final  . Glucose, Bld 06/14/2015 171* 65 - 99 mg/dL Final  . BUN 06/14/2015 12  6 - 20 mg/dL Final  . Creatinine, Ser 06/14/2015 0.88  0.61 - 1.24 mg/dL Final  . Calcium 06/14/2015 8.9  8.9 - 10.3 mg/dL Final  . GFR calc non Af Amer 06/14/2015 >60  >60 mL/min Final  .  GFR calc Af Amer 06/14/2015 >60  >60 mL/min Final   Comment: (NOTE) The eGFR has been calculated using the CKD EPI equation. This calculation has not been validated in all clinical situations. eGFR's persistently <60 mL/min signify possible Chronic Kidney Disease.   . Anion gap 06/14/2015 9  5 - 15 Final  . WBC 06/14/2015 6.6  4.0 - 10.5 K/uL Final  . RBC 06/14/2015 4.39  4.22 - 5.81 MIL/uL Final  . Hemoglobin 06/14/2015 13.7  13.0 - 17.0 g/dL Final  . HCT 06/14/2015 40.5  39.0 - 52.0 % Final  . MCV 06/14/2015 92.3  78.0 - 100.0 fL Final  . MCH 06/14/2015 31.2  26.0 - 34.0 pg Final  . MCHC 06/14/2015 33.8  30.0 - 36.0 g/dL Final  . RDW 06/14/2015 13.0  11.5 - 15.5 % Final  . Platelets 06/14/2015 204  150 - 400 K/uL Final  . Neutrophils Relative % 06/14/2015 75   Final  . Neutro Abs 06/14/2015 5.0  1.7 - 7.7 K/uL Final  . Lymphocytes Relative 06/14/2015 10   Final  . Lymphs Abs 06/14/2015 0.7  0.7 - 4.0 K/uL Final  . Monocytes Relative 06/14/2015 13   Final  . Monocytes Absolute 06/14/2015 0.9  0.1 - 1.0 K/uL Final  . Eosinophils Relative 06/14/2015 2   Final  . Eosinophils Absolute 06/14/2015 0.1  0.0 - 0.7 K/uL Final  . Basophils Relative 06/14/2015 0   Final  . Basophils Absolute 06/14/2015 0.0  0.0 - 0.1 K/uL Final  . Glucose-Capillary 06/13/2015 92  65 - 99 mg/dL Final  . Glucose-Capillary 06/14/2015 98  65 - 99 mg/dL Final  . Glucose-Capillary 06/14/2015 141* 65 - 99 mg/dL Final  . Glucose-Capillary 06/14/2015 99  65 - 99 mg/dL Final  . Comment 1 06/14/2015 Notify RN   Final  . Comment 2 06/14/2015 Document in Chart   Final  . Glucose-Capillary 06/14/2015 70  65 - 99 mg/dL Final  . Comment 1 06/14/2015 Notify RN   Final  . Comment 2 06/14/2015 Document in Chart   Final  . Glucose-Capillary 06/14/2015 112* 65 - 99 mg/dL Final  . Comment 1 06/14/2015 Notify RN   Final  . Comment 2 06/14/2015 Document in Chart   Final  . Glucose-Capillary 06/14/2015 155* 65 - 99 mg/dL Final    . Glucose-Capillary 06/14/2015 99  65 - 99 mg/dL Final  . Glucose-Capillary 06/15/2015 124* 65 - 99 mg/dL Final  . Glucose-Capillary 06/15/2015 106* 65 - 99 mg/dL Final  . Comment 1 06/15/2015 Notify RN   Final  . Comment 2 06/15/2015 Document in Chart   Final  . Glucose-Capillary 06/15/2015 177* 65 - 99 mg/dL Final  . Comment 1 06/15/2015 Notify RN   Final  . Comment 2 06/15/2015 Document in Chart   Final    Dg Chest Portable 1 View  06/23/2015  CLINICAL DATA:  65 year old male with tracheostomy change. EXAM: PORTABLE CHEST 1 VIEW COMPARISON:  Radiograph  dated 06/14/2015 FINDINGS: A tracheostomy is noted with tip above the carina. The lungs are clear. No pleural effusion or pneumothorax. The cardiac silhouette is within normal limits. Right mandibular fixation hardware. No acute fracture. IMPRESSION: Tracheostomy above the carina. No acute cardiopulmonary process. Electronically Signed   By: Anner Crete M.D.   On: 06/23/2015 01:45   Dg Chest Port 1 View  06/14/2015  CLINICAL DATA:  Community acquired pneumonia. History of emphysema. EXAM: PORTABLE CHEST 1 VIEW COMPARISON:  06/13/2015. FINDINGS: Tracheostomy in good position. Previously placed RIGHT mainstem bronchus tube has been removed. Normal cardiomediastinal silhouette. Mild vascular congestion without consolidation. No pneumothorax. IMPRESSION: Slight worsening aeration.  Mild vascular congestion. No evidence for additional tubes placed through tracheostomy at this time. Electronically Signed   By: Staci Righter M.D.   On: 06/14/2015 07:33   Dg Chest Port 1 View  06/13/2015  CLINICAL DATA:  Shortness of breath.  Trach placement. EXAM: PORTABLE CHEST 1 VIEW COMPARISON:  Shortness of breath FINDINGS: Patient has a stoma which was cannulated with a tube. The tip is in the right mainstem bronchus. More optimal positioning would require 4 to 5 cm of retraction. Left upper lobe opacity could be pneumonia or aspiration versus atelectasis from  intubation. Emphysema and hyperinflation. Normal heart size. Postoperative changes in the neck including plate and screw along the right mandible which has discontiguous screw, chronic based on previous chest x-ray. These results were called by telephone at the time of interpretation on 06/13/2015 at 3:10 am to Dr. Joseph Berkshire , who verbally acknowledged these results. IMPRESSION: 1. Right mainstem intubation.  Recommend retraction by 4 to 5 cm. 2. Left upper lobe pneumonia, aspiration, or atelectasis from selective intubation. Electronically Signed   By: Monte Fantasia M.D.   On: 06/13/2015 03:11     Assessment/Plan   ICD-9-CM ICD-10-CM   1. CAP (community acquired pneumonia) - resolved clinically 486 J18.9   2. Tracheostomy in place [facility]) V44.0 Z93.0   3. Lymphedema of face 457.1 I89.0   4. Osteoradionecrosis of jaw 526.89 M27.2    E926.9    5. S/P percutaneous endoscopic gastrostomy (PEG) tube placement (HCC) V44.1 Z93.1   6. Essential hypertension, benign 401.1 I10   7. Laryngeal cancer ([place]) 161.9 C32.9    Continue current medications as ordered  Will call [facility] regarding supplies for trach and Peg tube  Follow up with ENT and trach clinic as scheduled  Follow up as scheduled  Mesa Janus S. Perlie Gold  [facility] [address] [place], [place] [address] [phone] Cell (Monday-Friday 8 AM - 5 PM) [phone] After 5 PM and follow prompts

## 2015-07-08 ENCOUNTER — Other Ambulatory Visit: Payer: Self-pay | Admitting: *Deleted

## 2015-07-08 MED ORDER — AMBULATORY NON FORMULARY MEDICATION: Status: AC

## 2015-07-08 NOTE — Telephone Encounter (Signed)
[facility] [id number] Patricia Nettle) Requested Trach Supply Orders. To be signed and faxed to [facility] Fax: [phone]

## 2015-07-10 ENCOUNTER — Emergency Department (HOSPITAL_COMMUNITY)
Admission: EM | Admit: 2015-07-10 | Discharge: 2015-07-10 | Disposition: A | Discharge status: Home / Self Care | Payer: Medicare Other | Attending: Emergency Medicine | Admitting: Emergency Medicine

## 2015-07-10 ENCOUNTER — Encounter (HOSPITAL_COMMUNITY): Payer: Self-pay | Admitting: Emergency Medicine

## 2015-07-10 DIAGNOSIS — F329 Major depressive disorder, single episode, unspecified: Secondary | ICD-10-CM | POA: Diagnosis not present

## 2015-07-10 DIAGNOSIS — Z87891 Personal history of nicotine dependence: Secondary | ICD-10-CM | POA: Diagnosis not present

## 2015-07-10 DIAGNOSIS — J9509 Other tracheostomy complication: Secondary | ICD-10-CM | POA: Insufficient documentation

## 2015-07-10 DIAGNOSIS — E1165 Type 2 diabetes mellitus with hyperglycemia: Secondary | ICD-10-CM | POA: Diagnosis not present

## 2015-07-10 DIAGNOSIS — J9500 Unspecified tracheostomy complication: Secondary | ICD-10-CM

## 2015-07-10 DIAGNOSIS — I10 Essential (primary) hypertension: Secondary | ICD-10-CM | POA: Diagnosis not present

## 2015-07-10 DIAGNOSIS — Z85818 Personal history of malignant neoplasm of other sites of lip, oral cavity, and pharynx: Secondary | ICD-10-CM | POA: Insufficient documentation

## 2015-07-10 NOTE — ED Notes (Signed)
Per pt, states he needs trache collar replaced-lost it

## 2015-07-10 NOTE — ED Notes (Signed)
RT replaced trach collar and provided care as well as provided extra supplies to pt.

## 2015-07-10 NOTE — ED Provider Notes (Signed)
CSN: [id number]     Arrival date & time 07/10/15  4098 History   First MD Initiated Contact with Patient 07/10/15 [phone]     Chief Complaint  Patient presents with  . trach collar replaced       HPI Pt presents requesting a new trach collar for his tracheostomy. He currently has it tied on with a shoe string. No difficulty breathing. No other complaints    Past Medical History  Diagnosis Date  . Hearing loss   . Change in voice   . Rash   . Trouble swallowing   . Difficulty urinating   . Cervical spondylosis 03/25/2011  . Emphysema 03/25/2011  . GERD (gastroesophageal reflux disease) 03/25/2011  . Chronic pain 03/25/2011  . 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 Xerostomia 03/25/2011  . Ulcer   . UTI (lower urinary tract infection)   . Allergy   . Chronic sinusitis 04/27/2011  . Impaired glucose tolerance 04/27/2011  . Cervical spondylolysis 04/30/2011    severe  . Cancer ([place]) 1999    throat cancer  . Essential hypertension, benign 12/03/2012  . Type II or unspecified type diabetes mellitus without mention of complication, uncontrolled 04/27/2011  . Depression    Past Surgical History  Procedure Laterality Date  . Peg tube placement  11/1999  . Tracheostomy  11/1999  . Gastrostomy tube placement     Family History  Problem Relation Age of Onset  . Stroke Other   . Diabetes Other   . Cancer Other     lung cancer  . Diabetes Mother   . Heart disease Father    Social History  Substance Use Topics  . Smoking status: Former Smoker -- 23 years    Quit date: 11/06/1996  . Smokeless tobacco: Never Used  . Alcohol Use: No    Review of Systems  Respiratory: Negative for cough and shortness of breath.       Allergies  Oxybutynin chloride and Codeine  Home Medications   Prior to Admission medications   Medication Sig Start Date End Date Taking? Authorizing Provider  AMBULATORY NON FORMULARY MEDICATION Trach(Shiley 6.0 XLT UP uncuffed), [place], [place]  holders, Disposable inner cannulas 07/08/15   Gildardo Cranker, DO  azithromycin (ZITHROMAX) 200 MG/5ML suspension Place 6.3 mLs (250 mg total) into feeding tube daily. 06/15/15   [name], NP  cefdinir (OMNICEF) 125 MG/5ML suspension Place 12 mLs (300 mg total) into feeding tube 2 (two) times daily. 06/15/15   [name], NP  cholestyramine light (PREVALITE) 4 G packet Take 1 packet (4 g total) by mouth 3 (three) times daily. 01/06/15   Gildardo Cranker, DO  diphenoxylate-atropine (LOMOTIL) 2.5-0.025 MG tablet Take 1 tablet by mouth 4 (four) times daily as needed for diarrhea or loose stools. 12/02/14   Gildardo Cranker, DO  hydrOXYzine (VISTARIL) 25 MG capsule Take 1 capsule (25 mg total) by mouth 3 (three) times daily as needed. 03/04/15   [name], NP  ibuprofen (ADVIL,MOTRIN) 400 MG tablet Take one tablet by mouth three times daily as needed for pain 07/02/15   Gildardo Cranker, DO  losartan (COZAAR) 100 MG tablet take 1 tablet by mouth once daily 06/24/15   Gildardo Cranker, DO  naphazoline-pheniramine (NAPHCON-A) 0.025-0.3 % ophthalmic solution Place 2 drops into both eyes 4 (four) times daily as needed for irritation (red eye).    Historical Provider, MD  Nutritional Supplements (FEEDING SUPPLEMENT, JEVITY 1.2 CAL,) LIQD Place 237 mLs into feeding tube See  admin instructions. Pt takes 2 cans at breakfast, 2 cans at lunch, 2 cans at dinner and 2 at night 12/02/14   Gildardo Cranker, DO  oxymetazoline (12 HOUR NASAL SPRAY) 0.05 % nasal spray Place 2 sprays into the nose 2 (two) times daily as needed for congestion.     Historical Provider, MD  ranitidine (ZANTAC) 150 MG tablet Take 1 tablet (150 mg total) by mouth 2 (two) times daily. 05/17/15   Tiffany L Reed, DO  tamsulosin (FLOMAX) 0.4 MG CAPS capsule Take 1 capsule (0.4 mg total) by mouth daily. 08/06/14   [name], NP  triamcinolone cream (KENALOG) 0.1 % Apply 1 application topically 2 (two) times daily. 07/02/15   Monica Carter, DO   BP  153/81 mmHg  Pulse 85  Temp(Src) 99.1 F (37.3 C) (Oral)  Resp 20  SpO2 96% Physical Exam  Constitutional: He is oriented to person, place, and time. He appears well-developed and well-nourished.  HENT:  Head: Normocephalic.  Eyes: EOM are normal.  Neck:  tracheostomy in place  Pulmonary/Chest: Effort normal.  Abdominal: He exhibits no distension.  Musculoskeletal: Normal range of motion.  Neurological: He is alert and oriented to person, place, and time.  Psychiatric: He has a normal mood and affect.  Nursing note and vitals reviewed.   ED Course  Procedures (including critical care time) Labs Review Labs Reviewed - No data to display  Imaging Review No results found. I have personally reviewed and evaluated these images and lab results as part of my medical decision-making.   EKG Interpretation None      MDM   Final diagnoses:  Tracheostomy complication, unspecified complication type ([place])    New trach collar will be applied. Well appearing. MSE complete    Jola Schmidt, MD 07/10/15 [phone]

## 2015-07-10 NOTE — ED Notes (Signed)
Called RT to come and replace trach collar and perform care

## 2015-07-21 ENCOUNTER — Ambulatory Visit (HOSPITAL_COMMUNITY): Payer: Medicare Other

## 2015-07-22 ENCOUNTER — Inpatient Hospital Stay (HOSPITAL_COMMUNITY): Admission: RE | Admit: 2015-07-22 | Payer: Medicare Other | Source: Ambulatory Visit

## 2015-08-02 ENCOUNTER — Ambulatory Visit: Payer: Medicare Other | Admitting: Nurse Practitioner

## 2015-08-04 ENCOUNTER — Ambulatory Visit (HOSPITAL_COMMUNITY)
Admission: RE | Admit: 2015-08-04 | Discharge: 2015-08-04 | Disposition: A | Discharge status: Home / Self Care | Payer: Medicare Other | Source: Ambulatory Visit | Attending: Acute Care | Admitting: Acute Care

## 2015-08-04 DIAGNOSIS — I890 Lymphedema, not elsewhere classified: Secondary | ICD-10-CM | POA: Insufficient documentation

## 2015-08-04 DIAGNOSIS — Z85819 Personal history of malignant neoplasm of unspecified site of lip, oral cavity, and pharynx: Secondary | ICD-10-CM | POA: Diagnosis not present

## 2015-08-04 DIAGNOSIS — J449 Chronic obstructive pulmonary disease, unspecified: Secondary | ICD-10-CM | POA: Diagnosis not present

## 2015-08-04 DIAGNOSIS — Z430 Encounter for attention to tracheostomy: Secondary | ICD-10-CM | POA: Diagnosis present

## 2015-08-04 DIAGNOSIS — M272 Inflammatory conditions of jaws: Secondary | ICD-10-CM | POA: Diagnosis not present

## 2015-08-04 DIAGNOSIS — Z930 Tracheostomy status: Secondary | ICD-10-CM | POA: Diagnosis not present

## 2015-08-04 DIAGNOSIS — I10 Essential (primary) hypertension: Secondary | ICD-10-CM | POA: Diagnosis not present

## 2015-08-04 DIAGNOSIS — E119 Type 2 diabetes mellitus without complications: Secondary | ICD-10-CM | POA: Insufficient documentation

## 2015-08-04 NOTE — Progress Notes (Signed)
Patient ID: Nicholas Cobb, male    DOB: [birth year], 65 y.o.   MRN: [id number]   Subjective:   Here for routine trach care; no new complaints     HPI  65 y.o. male with PMHx of COPD, osteoradionecrosis of jaw, and chronic trach dependence. Also DM, HTN and throat cancer who presents to trach clinic today for routine trach care.   Review of Systems  Constitutional: Negative.   HENT: Positive for facial swelling.        No change from baseline since last visit   Eyes: Negative.   Respiratory: Negative.   Cardiovascular: Negative.   Gastrointestinal: Negative.   Endocrine: Negative.   Genitourinary: Negative.   Musculoskeletal: Negative.   Skin: Negative.   Neurological: Negative.   Psychiatric/Behavioral: Negative.        Objective:   Physical Exam  Constitutional: He is oriented to person, place, and time. No distress.  HENT:  Head: Atraumatic.  Right facial swelling chronic and unchanged. 6 Cuffless XLT (proximal) w/out exudate or significant tracheal secretions. Stoma is well matured. His neck is contracted and he has restricted mobility to -->unable to turn head to left   Eyes: Conjunctivae and EOM are normal. Pupils are equal, round, and reactive to light. Right eye exhibits no discharge.  Neck:  See above   Cardiovascular: Normal rate and regular rhythm.   Pulmonary/Chest: Effort normal and breath sounds normal. No respiratory distress. He has no wheezes.  Abdominal: Soft. Bowel sounds are normal. He exhibits no distension.  Musculoskeletal: Normal range of motion. He exhibits no edema.  Neurological: He is alert and oriented to person, place, and time.  Skin: Skin is warm and dry. He is not diaphoretic.  Psychiatric: He has a normal mood and affect. His behavior is normal.  Actually much more cooperative and pleasant than usual   Vitals reviewed.        Assessment & Plan:  Trach dependence w/ History of osteoradionecrosis of jaw w/ chronic facial lymphedema  s/p head and neck cancer' Plan Cont routine trach care ROV 3 months No change

## 2015-08-04 NOTE — Progress Notes (Signed)
Tracheostomy Procedure Note  Nicholas Cobb [id number] 08-30-1950  Pre Procedure Tracheostomy Information  Trach Brand: Shiley Size: 6.0 proximal xlt Style: Uncuffed Secured by: Velcro   Procedure: routine trach change    Post Procedure Tracheostomy Information  Trach Brand: Shiley Size: 6.0 xlt proximal Style: Uncuffed Secured by: Velcro   Post Procedure Evaluation:  ETCO2 positive color change from yellow to purple : Yes.   Vital signs:blood pressure 151/77, pulse 80, respirations 20 and pulse oximetry 95% Patients current condition: stable Complications: No apparent complications Trach site exam: clean, dry Wound care done: dry Patient did tolerate procedure well.   Education: none  Prescription needs: none    Additional needs: none

## 2015-09-14 ENCOUNTER — Encounter (HOSPITAL_COMMUNITY): Payer: Self-pay

## 2015-09-14 ENCOUNTER — Emergency Department (HOSPITAL_COMMUNITY): Payer: Medicare Other

## 2015-09-14 ENCOUNTER — Inpatient Hospital Stay (HOSPITAL_COMMUNITY)
Admission: EM | Admit: 2015-09-14 | Discharge: 2015-09-17 | DRG: 208 | Disposition: A | Discharge status: Home Health | Payer: Medicare Other | Attending: Internal Medicine | Admitting: Internal Medicine

## 2015-09-14 ENCOUNTER — Inpatient Hospital Stay (HOSPITAL_COMMUNITY): Payer: Medicare Other

## 2015-09-14 DIAGNOSIS — G9341 Metabolic encephalopathy: Secondary | ICD-10-CM | POA: Diagnosis present

## 2015-09-14 DIAGNOSIS — R042 Hemoptysis: Secondary | ICD-10-CM | POA: Diagnosis present

## 2015-09-14 DIAGNOSIS — J9601 Acute respiratory failure with hypoxia: Secondary | ICD-10-CM | POA: Diagnosis present

## 2015-09-14 DIAGNOSIS — J439 Emphysema, unspecified: Secondary | ICD-10-CM | POA: Diagnosis present

## 2015-09-14 DIAGNOSIS — M272 Inflammatory conditions of jaws: Secondary | ICD-10-CM | POA: Diagnosis present

## 2015-09-14 DIAGNOSIS — E118 Type 2 diabetes mellitus with unspecified complications: Secondary | ICD-10-CM | POA: Diagnosis present

## 2015-09-14 DIAGNOSIS — I469 Cardiac arrest, cause unspecified: Secondary | ICD-10-CM | POA: Diagnosis present

## 2015-09-14 DIAGNOSIS — T17498A Other foreign object in trachea causing other injury, initial encounter: Secondary | ICD-10-CM | POA: Diagnosis present

## 2015-09-14 DIAGNOSIS — R001 Bradycardia, unspecified: Secondary | ICD-10-CM | POA: Diagnosis present

## 2015-09-14 DIAGNOSIS — J9503 Malfunction of tracheostomy stoma: Secondary | ICD-10-CM | POA: Diagnosis present

## 2015-09-14 DIAGNOSIS — G8929 Other chronic pain: Secondary | ICD-10-CM | POA: Diagnosis present

## 2015-09-14 DIAGNOSIS — Z87891 Personal history of nicotine dependence: Secondary | ICD-10-CM

## 2015-09-14 DIAGNOSIS — Y842 Radiological procedure and radiotherapy as the cause of abnormal reaction of the patient, or of later complication, without mention of misadventure at the time of the procedure: Secondary | ICD-10-CM | POA: Diagnosis present

## 2015-09-14 DIAGNOSIS — E869 Volume depletion, unspecified: Secondary | ICD-10-CM | POA: Diagnosis present

## 2015-09-14 DIAGNOSIS — I10 Essential (primary) hypertension: Secondary | ICD-10-CM | POA: Diagnosis present

## 2015-09-14 DIAGNOSIS — E871 Hypo-osmolality and hyponatremia: Secondary | ICD-10-CM | POA: Diagnosis present

## 2015-09-14 DIAGNOSIS — J9621 Acute and chronic respiratory failure with hypoxia: Secondary | ICD-10-CM | POA: Diagnosis present

## 2015-09-14 DIAGNOSIS — R0602 Shortness of breath: Secondary | ICD-10-CM | POA: Diagnosis present

## 2015-09-14 DIAGNOSIS — Z930 Tracheostomy status: Secondary | ICD-10-CM

## 2015-09-14 DIAGNOSIS — F411 Generalized anxiety disorder: Secondary | ICD-10-CM | POA: Diagnosis present

## 2015-09-14 DIAGNOSIS — K219 Gastro-esophageal reflux disease without esophagitis: Secondary | ICD-10-CM | POA: Diagnosis present

## 2015-09-14 DIAGNOSIS — C140 Malignant neoplasm of pharynx, unspecified: Secondary | ICD-10-CM | POA: Diagnosis present

## 2015-09-14 DIAGNOSIS — Z85819 Personal history of malignant neoplasm of unspecified site of lip, oral cavity, and pharynx: Secondary | ICD-10-CM

## 2015-09-14 DIAGNOSIS — J690 Pneumonitis due to inhalation of food and vomit: Secondary | ICD-10-CM | POA: Diagnosis present

## 2015-09-14 DIAGNOSIS — H9190 Unspecified hearing loss, unspecified ear: Secondary | ICD-10-CM | POA: Diagnosis present

## 2015-09-14 DIAGNOSIS — E1165 Type 2 diabetes mellitus with hyperglycemia: Secondary | ICD-10-CM | POA: Diagnosis present

## 2015-09-14 DIAGNOSIS — R092 Respiratory arrest: Secondary | ICD-10-CM

## 2015-09-14 DIAGNOSIS — Z931 Gastrostomy status: Secondary | ICD-10-CM

## 2015-09-14 DIAGNOSIS — R1110 Vomiting, unspecified: Secondary | ICD-10-CM

## 2015-09-14 LAB — CBC WITH DIFFERENTIAL/PLATELET
Basophils Absolute: 0 10*3/uL (ref 0.0–0.1)
Basophils Relative: 0 %
EOS ABS: 0.1 10*3/uL (ref 0.0–0.7)
EOS PCT: 1 %
HCT: 45.5 % (ref 39.0–52.0)
Hemoglobin: 14.8 g/dL (ref 13.0–17.0)
Lymphocytes Relative: 24 %
Lymphs Abs: 2.8 10*3/uL (ref 0.7–4.0)
MCH: 30.1 pg (ref 26.0–34.0)
MCHC: 32.5 g/dL (ref 30.0–36.0)
MCV: 92.5 fL (ref 78.0–100.0)
MONO ABS: 1.2 10*3/uL — AB (ref 0.1–1.0)
Monocytes Relative: 10 %
NEUTROS PCT: 65 %
Neutro Abs: 7.4 10*3/uL (ref 1.7–7.7)
PLATELETS: 231 10*3/uL (ref 150–400)
RBC: 4.92 MIL/uL (ref 4.22–5.81)
RDW: 12.8 % (ref 11.5–15.5)
WBC: 11.5 10*3/uL — AB (ref 4.0–10.5)

## 2015-09-14 LAB — TROPONIN I
TROPONIN I: 0.05 ng/mL — AB (ref <0.03)
Troponin I: 0.06 ng/mL (ref <0.03)

## 2015-09-14 LAB — TRIGLYCERIDES: TRIGLYCERIDES: 119 mg/dL (ref <150)

## 2015-09-14 LAB — I-STAT ARTERIAL BLOOD GAS, ED
Acid-base deficit: 3 mmol/L — ABNORMAL HIGH (ref 0.0–2.0)
Bicarbonate: 25.9 mEq/L — ABNORMAL HIGH (ref 20.0–24.0)
O2 Saturation: 100 %
PH ART: 7.243 — AB (ref 7.350–7.450)
TCO2: 28 mmol/L (ref 0–100)
pCO2 arterial: 60.1 mmHg (ref 35.0–45.0)
pO2, Arterial: 283 mmHg — ABNORMAL HIGH (ref 80.0–100.0)

## 2015-09-14 LAB — URINALYSIS, ROUTINE W REFLEX MICROSCOPIC
Bilirubin Urine: NEGATIVE
GLUCOSE, UA: NEGATIVE mg/dL
Hgb urine dipstick: NEGATIVE
Ketones, ur: NEGATIVE mg/dL
LEUKOCYTES UA: NEGATIVE
NITRITE: NEGATIVE
PH: 7.5 (ref 5.0–8.0)
Protein, ur: NEGATIVE mg/dL
SPECIFIC GRAVITY, URINE: 1.013 (ref 1.005–1.030)

## 2015-09-14 LAB — GLUCOSE, CAPILLARY
GLUCOSE-CAPILLARY: 134 mg/dL — AB (ref 65–99)
GLUCOSE-CAPILLARY: 92 mg/dL (ref 65–99)
Glucose-Capillary: 124 mg/dL — ABNORMAL HIGH (ref 65–99)
Glucose-Capillary: 85 mg/dL (ref 65–99)
Glucose-Capillary: 102 mg/dL — ABNORMAL HIGH (ref 65–99)
Glucose-Capillary: 110 mg/dL — ABNORMAL HIGH (ref 65–99)

## 2015-09-14 LAB — BASIC METABOLIC PANEL
ANION GAP: 11 (ref 5–15)
BUN: 16 mg/dL (ref 6–20)
CO2: 24 mmol/L (ref 22–32)
Calcium: 9 mg/dL (ref 8.9–10.3)
Chloride: 96 mmol/L — ABNORMAL LOW (ref 101–111)
Creatinine, Ser: 1.21 mg/dL (ref 0.61–1.24)
Glucose, Bld: 252 mg/dL — ABNORMAL HIGH (ref 65–99)
POTASSIUM: 3.9 mmol/L (ref 3.5–5.1)
SODIUM: 131 mmol/L — AB (ref 135–145)

## 2015-09-14 LAB — I-STAT CHEM 8, ED
BUN: 21 mg/dL — ABNORMAL HIGH (ref 6–20)
Calcium, Ion: 1.15 mmol/L (ref 1.12–1.23)
Chloride: 92 mmol/L — ABNORMAL LOW (ref 101–111)
Creatinine, Ser: 1.1 mg/dL (ref 0.61–1.24)
GLUCOSE: 253 mg/dL — AB (ref 65–99)
HEMATOCRIT: 49 % (ref 39.0–52.0)
HEMOGLOBIN: 16.7 g/dL (ref 13.0–17.0)
POTASSIUM: 3.9 mmol/L (ref 3.5–5.1)
Sodium: 133 mmol/L — ABNORMAL LOW (ref 135–145)
TCO2: 28 mmol/L (ref 0–100)

## 2015-09-14 LAB — MAGNESIUM: Magnesium: 2.4 mg/dL (ref 1.7–2.4)

## 2015-09-14 LAB — PROCALCITONIN: Procalcitonin: 0.1 ng/mL

## 2015-09-14 LAB — LACTIC ACID, PLASMA: LACTIC ACID, VENOUS: 3.2 mmol/L — AB (ref 0.5–1.9)

## 2015-09-14 LAB — CBG MONITORING, ED: GLUCOSE-CAPILLARY: 139 mg/dL — AB (ref 65–99)

## 2015-09-14 LAB — I-STAT CG4 LACTIC ACID, ED
Lactic Acid, Venous: 5.86 mmol/L (ref 0.5–1.9)
Lactic Acid, Venous: 3.12 mmol/L (ref 0.5–1.9)

## 2015-09-14 LAB — PROTIME-INR
INR: 1
PROTHROMBIN TIME: 13.2 s (ref 11.4–15.2)

## 2015-09-14 LAB — MRSA PCR SCREENING: MRSA BY PCR: NEGATIVE

## 2015-09-14 LAB — PHOSPHORUS: PHOSPHORUS: 5.8 mg/dL — AB (ref 2.5–4.6)

## 2015-09-14 MED ORDER — TAMSULOSIN HCL 0.4 MG PO CAPS
0.4000 mg | ORAL_CAPSULE | Freq: Every day | ORAL | Status: DC
  Administered 2015-09-16: 0.4 mg via ORAL
  Filled 2015-09-14 (×4): qty 1

## 2015-09-14 MED ORDER — MIDAZOLAM HCL 2 MG/2ML IJ SOLN
2.0000 mg | Freq: Once | INTRAMUSCULAR | Status: AC
  Administered 2015-09-14: 2 mg via INTRAVENOUS

## 2015-09-14 MED ORDER — SODIUM CHLORIDE 0.9 % IV BOLUS (SEPSIS)
1000.0000 mL | Freq: Once | INTRAVENOUS | Status: AC
  Administered 2015-09-14: 1000 mL via INTRAVENOUS

## 2015-09-14 MED ORDER — MIDAZOLAM HCL 2 MG/2ML IJ SOLN
2.0000 mg | Freq: Once | INTRAMUSCULAR | Status: AC
  Administered 2015-09-14: 2 mg via INTRAVENOUS
  Filled 2015-09-14: qty 2

## 2015-09-14 MED ORDER — GUAIFENESIN 100 MG/5ML PO SOLN
5.0000 mL | ORAL | Status: DC | PRN
  Administered 2015-09-17: 100 mg via ORAL
  Filled 2015-09-14 (×2): qty 5

## 2015-09-14 MED ORDER — PROPOFOL 1000 MG/100ML IV EMUL
0.0000 ug/kg/min | INTRAVENOUS | Status: DC
  Filled 2015-09-14: qty 100

## 2015-09-14 MED ORDER — AMPICILLIN-SULBACTAM SODIUM 3 (2-1) G IJ SOLR
3.0000 g | Freq: Four times a day (QID) | INTRAMUSCULAR | Status: AC
  Administered 2015-09-14 – 2015-09-16 (×8): 3 g via INTRAVENOUS
  Filled 2015-09-14 (×9): qty 3

## 2015-09-14 MED ORDER — PRO-STAT SUGAR FREE PO LIQD
30.0000 mL | Freq: Every day | ORAL | Status: DC
  Administered 2015-09-15 – 2015-09-17 (×3): 30 mL
  Filled 2015-09-14 (×4): qty 30

## 2015-09-14 MED ORDER — FAMOTIDINE IN NACL 20-0.9 MG/50ML-% IV SOLN
20.0000 mg | Freq: Two times a day (BID) | INTRAVENOUS | Status: DC
  Administered 2015-09-14: 20 mg via INTRAVENOUS
  Filled 2015-09-14 (×3): qty 50

## 2015-09-14 MED ORDER — SALINE SPRAY 0.65 % NA SOLN
1.0000 | NASAL | Status: DC | PRN
  Administered 2015-09-14: 1 via NASAL
  Filled 2015-09-14: qty 44

## 2015-09-14 MED ORDER — JEVITY 1.2 CAL PO LIQD
1000.0000 mL | ORAL | Status: DC
  Administered 2015-09-14 – 2015-09-15 (×2): 1000 mL
  Administered 2015-09-16: 16:00:00
  Filled 2015-09-14 (×8): qty 1000

## 2015-09-14 MED ORDER — CHLORHEXIDINE GLUCONATE 0.12% ORAL RINSE (MEDLINE KIT)
15.0000 mL | Freq: Two times a day (BID) | OROMUCOSAL | Status: DC
  Administered 2015-09-14 – 2015-09-17 (×7): 15 mL via OROMUCOSAL

## 2015-09-14 MED ORDER — EPINEPHRINE HCL 0.1 MG/ML IJ SOSY
PREFILLED_SYRINGE | INTRAMUSCULAR | Status: AC | PRN
  Administered 2015-09-14: 1 via INTRAVENOUS

## 2015-09-14 MED ORDER — FREE WATER
200.0000 mL | Freq: Four times a day (QID) | Status: DC
  Administered 2015-09-14 – 2015-09-17 (×11): 200 mL

## 2015-09-14 MED ORDER — LOSARTAN POTASSIUM 50 MG PO TABS
50.0000 mg | ORAL_TABLET | Freq: Every day | ORAL | Status: DC
  Administered 2015-09-14: 50 mg via ORAL
  Filled 2015-09-14 (×2): qty 1

## 2015-09-14 MED ORDER — SODIUM CHLORIDE 0.9 % IV BOLUS (SEPSIS)
500.0000 mL | Freq: Once | INTRAVENOUS | Status: AC
  Administered 2015-09-14: 500 mL via INTRAVENOUS

## 2015-09-14 MED ORDER — FENTANYL CITRATE (PF) 100 MCG/2ML IJ SOLN
INTRAMUSCULAR | Status: AC
  Filled 2015-09-14: qty 2

## 2015-09-14 MED ORDER — ALBUTEROL SULFATE (2.5 MG/3ML) 0.083% IN NEBU: 2.5000 mg | INHALATION_SOLUTION | RESPIRATORY_TRACT | Status: DC | PRN

## 2015-09-14 MED ORDER — ANTISEPTIC ORAL RINSE SOLUTION (CORINZ)
7.0000 mL | Freq: Four times a day (QID) | OROMUCOSAL | Status: DC
  Administered 2015-09-14 – 2015-09-17 (×13): 7 mL via OROMUCOSAL

## 2015-09-14 MED ORDER — FENTANYL CITRATE (PF) 100 MCG/2ML IJ SOLN: 50.0000 ug | INTRAMUSCULAR | Status: DC | PRN

## 2015-09-14 MED ORDER — MIDAZOLAM HCL 2 MG/2ML IJ SOLN
INTRAMUSCULAR | Status: AC
  Filled 2015-09-14: qty 2

## 2015-09-14 MED ORDER — FENTANYL CITRATE (PF) 100 MCG/2ML IJ SOLN
50.0000 ug | Freq: Once | INTRAMUSCULAR | Status: AC
  Administered 2015-09-14: 50 ug via INTRAVENOUS

## 2015-09-14 MED ORDER — SODIUM CHLORIDE 0.9 % IV SOLN: INTRAVENOUS | Status: DC

## 2015-09-14 MED ORDER — SODIUM CHLORIDE 0.9 % IV SOLN: 250.0000 mL | INTRAVENOUS | Status: DC | PRN

## 2015-09-14 MED ORDER — CLINDAMYCIN PHOSPHATE 600 MG/50ML IV SOLN
600.0000 mg | Freq: Once | INTRAVENOUS | Status: DC
  Filled 2015-09-14: qty 50

## 2015-09-14 MED ORDER — SODIUM CHLORIDE 0.9 % IV SOLN
3.0000 g | INTRAVENOUS | Status: AC
  Administered 2015-09-14: 3 g via INTRAVENOUS
  Filled 2015-09-14: qty 3

## 2015-09-14 MED ORDER — FAMOTIDINE 40 MG/5ML PO SUSR
20.0000 mg | Freq: Two times a day (BID) | ORAL | Status: DC
  Administered 2015-09-14 – 2015-09-17 (×7): 20 mg
  Filled 2015-09-14 (×7): qty 2.5

## 2015-09-14 MED ORDER — INSULIN ASPART 100 UNIT/ML ~~LOC~~ SOLN
2.0000 [IU] | SUBCUTANEOUS | Status: DC
  Administered 2015-09-14 – 2015-09-15 (×3): 2 [IU] via SUBCUTANEOUS

## 2015-09-14 MED ORDER — NAPHAZOLINE-PHENIRAMINE 0.025-0.3 % OP SOLN
2.0000 [drp] | Freq: Two times a day (BID) | OPHTHALMIC | Status: DC
  Administered 2015-09-14 – 2015-09-17 (×6): 2 [drp] via OPHTHALMIC
  Filled 2015-09-14 (×2): qty 5

## 2015-09-14 MED FILL — Medication: Qty: 1 | Status: AC

## 2015-09-14 NOTE — Progress Notes (Signed)
Pt seems very frustrated. Communicated the wish to leave, because" It is different from what he get used to do at home" Refusing tube feeds and medications , because "it is not the way he used to". Daughter at the bedside. Spoke, updated, and education and emotional support  was given . Daughter is talking to patient at this time

## 2015-09-14 NOTE — ED Provider Notes (Addendum)
[facility] DEPT Provider Note   CSN: [id number] Arrival date & time: 09/14/15  0149  First Provider Contact:   First MD Initiated Contact with Patient 09/14/15 0216     By signing my name below, I, Nicholas Cobb, attest that this documentation has been prepared under the direction and in the presence of Nicholas Hacker, MD. Electronically Signed: Judithann Cobb, ED Scribe. 09/14/15. 2:37 AM.    History   Chief Complaint Chief Complaint  Patient presents with  . Shortness of Breath   HPI Comments: Level 5 Caveat due to respiratory distress Nicholas Cobb is a 65 y.o. male with a hx of throat cancer, emphysema, chronic respiratory failure, and DM brought in by ambulance, who presents to the Emergency Department with a tracheostomy in place for evaluation s/p tracheostomy tube removal that occurred PTA. Pt pulled out the tube himself PTA and according to his note, it was because he was SOB. He was getting his tube suctioned by Respiratory here in the ED when he began to cough with blood out of his stoma. Respiratory was concerned of a possible obstruction and although his O2 stats were 100% on arrival, it was 85% when I arrived.   On my initial evaluation, pulse ox 85%. Patient was alert. He is nonverbal at baseline secondary to tracheostomy. He was tripoding. It appeared to be in distress. Requested replacement tracheostomy. Patient subsequently had an aspiration event and became bradycardic and lost pulses. Unknown rhythm at that time. CPR was initiated. A 6-0 ET tube was placed without difficulty. He got 1 mg of epinephrine. Down time was approximately 90 seconds. At [facility], patient noted to be tachycardic in sinus rhythm. Breathing independently on his own and arousable but not following commands. He does not appear to be at his baseline. He was transferred to the trauma bay. He was sedated for definitive airway management. It was noted that he desatted with the 6-0 tube in place to  the 50s.  Replacement tracheostomy is at the bedside.  This was placed with bougie technique. O2 sats recovered with positive pressure ventilation. He is placed on sedation for rest. He had evidence of both blood from his stoma and tube feeds from his mouth concerning for aspiration event.  The history is provided by the patient (Note left by patient). No language interpreter was used.    Past Medical History:  Diagnosis Date  . Allergy   . Cancer ([place]) 1999   throat cancer  . Cervical spondylolysis 04/30/2011   severe  . Cervical spondylosis 03/25/2011  . Change in voice   . Chronic pain 03/25/2011  . Chronic sinusitis 04/27/2011  . Depression   . Difficulty urinating   . Emphysema 03/25/2011  . Essential hypertension, benign 12/03/2012  . GERD (gastroesophageal reflux disease) 03/25/2011  . Hearing loss   . Impaired glucose tolerance 04/27/2011  . 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 Rash   . Trouble swallowing   . Type II or unspecified type diabetes mellitus without mention of complication, uncontrolled 04/27/2011  . Ulcer   . UTI (lower urinary tract infection)   . Xerostomia 03/25/2011    Patient Active Problem List   Diagnosis Date Noted  . Chronic respiratory failure ([place]) 06/14/2015  . Respiratory failure ([place]) 06/13/2015  . Tracheostomy dependence ([place])   . Cellulitis and abscess of head   . Cellulitis of face   . Tracheostomy status ([place])   . Acute sinus infection 10/29/2013  . Rash 10/29/2013  . Hyponatremia  12/03/2012  . Essential hypertension, benign 12/03/2012  . Cervical spondylolysis 04/30/2011  . Chronic sinusitis 04/27/2011  . Diabetes 04/27/2011  . Erectile dysfunction 04/19/2011  . Preventative health care 03/25/2011  . Cervical spondylosis 03/25/2011  . Emphysema 03/25/2011  . GERD (gastroesophageal reflux disease) 03/25/2011  . Chronic pain 03/25/2011  . 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 Xerostomia 03/25/2011  . Tracheostomy in place [facility])  02/04/2011  . Laryngeal cancer ([place]) 02/04/2011  . Encounter for PEG (percutaneous endoscopic gastrostomy) ([place]) 02/04/2011  . Attention to G-tube [facility]) 11/07/2010    Past Surgical History:  Procedure Laterality Date  . GASTROSTOMY TUBE PLACEMENT    . PEG TUBE PLACEMENT  11/1999  . TRACHEOSTOMY  11/1999       Home Medications    Prior to Admission medications   Medication Sig Start Date End Date Taking? Authorizing Provider  AMBULATORY NON FORMULARY MEDICATION Trach(Shiley 6.0 XLT UP uncuffed), [place], [place] holders, Disposable inner cannulas 07/08/15   Gildardo Cranker, DO  azithromycin (ZITHROMAX) 200 MG/5ML suspension Place 6.3 mLs (250 mg total) into feeding tube daily. 06/15/15   [name], NP  cefdinir (OMNICEF) 125 MG/5ML suspension Place 12 mLs (300 mg total) into feeding tube 2 (two) times daily. 06/15/15   [name], NP  cholestyramine light (PREVALITE) 4 G packet Take 1 packet (4 g total) by mouth 3 (three) times daily. 01/06/15   Gildardo Cranker, DO  diphenoxylate-atropine (LOMOTIL) 2.5-0.025 MG tablet Take 1 tablet by mouth 4 (four) times daily as needed for diarrhea or loose stools. 12/02/14   Gildardo Cranker, DO  hydrOXYzine (VISTARIL) 25 MG capsule Take 1 capsule (25 mg total) by mouth 3 (three) times daily as needed. 03/04/15   [name], NP  ibuprofen (ADVIL,MOTRIN) 400 MG tablet Take one tablet by mouth three times daily as needed for pain 07/02/15   Gildardo Cranker, DO  losartan (COZAAR) 100 MG tablet take 1 tablet by mouth once daily 06/24/15   Gildardo Cranker, DO  naphazoline-pheniramine (NAPHCON-A) 0.025-0.3 % ophthalmic solution Place 2 drops into both eyes 4 (four) times daily as needed for irritation (red eye).    Historical Provider, MD  Nutritional Supplements (FEEDING SUPPLEMENT, JEVITY 1.2 CAL,) LIQD Place 237 mLs into feeding tube See admin instructions. Pt takes 2 cans at breakfast, 2 cans at lunch, 2 cans at dinner and 2 at night 12/02/14    Gildardo Cranker, DO  oxymetazoline (12 HOUR NASAL SPRAY) 0.05 % nasal spray Place 2 sprays into the nose 2 (two) times daily as needed for congestion.     Historical Provider, MD  ranitidine (ZANTAC) 150 MG tablet Take 1 tablet (150 mg total) by mouth 2 (two) times daily. 05/17/15   Tiffany L Reed, DO  tamsulosin (FLOMAX) 0.4 MG CAPS capsule Take 1 capsule (0.4 mg total) by mouth daily. 08/06/14   [name], NP  triamcinolone cream (KENALOG) 0.1 % Apply 1 application topically 2 (two) times daily. 07/02/15   Gildardo Cranker, DO    Family History Family History  Problem Relation Age of Onset  . Diabetes Mother   . Heart disease Father   . Stroke Other   . Diabetes Other   . Cancer Other     lung cancer    Social History Social History  Substance Use Topics  . Smoking status: Former Smoker    Years: 23.00    Quit date: 11/06/1996  . Smokeless tobacco: Never Used  . Alcohol use No  Allergies   Oxybutynin chloride and Codeine   Review of Systems Review of Systems  Unable to perform ROS: Severe respiratory distress     Physical Exam Updated Vital Signs BP 113/61   Pulse 106   Temp 99 F (37.2 C) (Oral)   Resp 19   Ht _0  (1.702 m)   Wt 166 lb 0.1 oz (75.3 kg)   SpO2 (P) 100%   BMI 26.00 kg/m   Physical Exam  Constitutional:  Chronically ill-appearing, respiratory distress  HENT:  Mandibular deformity noted  Eyes:  Pupils 4 mm and reactive bilaterally  Neck:  Neck in flexed position, unable to extend, stoma patent with blood noted  Cardiovascular: Regular rhythm and normal heart sounds.   tachycardia  Pulmonary/Chest: He is in respiratory distress.  Abdominal: Soft. Bowel sounds are normal. There is no tenderness. There is no rebound.  Mildly distended, PEG tube in place, surrounding skin clean dry and intact  Lymphadenopathy:    He has no cervical adenopathy.  Neurological:  Initially alert and able to write out some history, post arrest arousable  but not following commands  Skin: Skin is warm and dry.  Nursing note and vitals reviewed.    ED Treatments / Results  DIAGNOSTIC STUDIES: Oxygen Saturation is 100% on RA, normal by my interpretation.    COORDINATION OF CARE: 2:30 AM- Pt advised of plan for treatment and pt agrees. Pt will receive chest x-ray, lab work, and EKG for further evaluation.    Labs (all labs ordered are listed, but only abnormal results are displayed) Labs Reviewed  CULTURE, BLOOD (ROUTINE X 2)  CULTURE, BLOOD (ROUTINE X 2)  CBC WITH DIFFERENTIAL/PLATELET  BASIC METABOLIC PANEL  TRIGLYCERIDES  I-STAT CHEM 8, ED  I-STAT CG4 LACTIC ACID, ED  I-STAT ARTERIAL BLOOD GAS, ED    EKG  EKG Interpretation  Date/Time:  Tuesday September 14 2015 02:51:42 EDT Ventricular Rate:  101 PR Interval:    QRS Duration: 82 QT Interval:  363 QTC Calculation: 471 R Axis:   104 Text Interpretation:  Sinus tachycardia RAE, consider biatrial enlargement Confirmed by Eurika Sandy  MD, Obert Espindola ([id number]) on 09/14/2015 3:18:31 AM       Radiology No results found.  Procedures TRACHEOSTOMY REPLACEMENT Date/Time: 09/14/2015 3:13 AM Performed by: Nicholas Cobb Authorized by: Thayer Jew F  Consent: The procedure was performed in an emergent situation. Verbal consent not obtained. Indications: became dislodged and obstruction Local anesthesia used: no  Anesthesia: Local anesthesia used: no  Sedation: Patient sedated: no Tube cuff: single cuff Tube size: 6.0 mm Cuff inflation: inflated Cuff inflation technique: minimal leak technique used Seldinger technique: Seldinger technique used Complications: bleeding Patient tolerance: Patient tolerated the procedure well with no immediate complications    (including critical care time)   CRITICAL CARE Performed by: Nicholas Cobb   Total critical care time:40  minutes  Critical care time was exclusive of separately billable procedures and treating other  patients.  Critical care was necessary to treat or prevent imminent or life-threatening deterioration.  Critical care was time spent personally by me on the following activities: development of treatment plan with patient and/or surrogate as well as nursing, discussions with consultants, evaluation of patient's response to treatment, examination of patient, obtaining history from patient or surrogate, ordering and performing treatments and interventions, ordering and review of laboratory studies, ordering and review of radiographic studies, pulse oximetry and re-evaluation of patient's condition.  Medications Ordered in ED Medications  clindamycin (CLEOCIN) IVPB 600  mg (not administered)  fentaNYL (SUBLIMAZE) injection 50 mcg (not administered)  fentaNYL (SUBLIMAZE) injection 50 mcg (not administered)  propofol (DIPRIVAN) 1000 MG/100ML infusion (not administered)  midazolam (VERSED) injection 2 mg (2 mg Intravenous Given 09/14/15 0229)  midazolam (VERSED) injection 2 mg (2 mg Intravenous Given 09/14/15 0249)  fentaNYL (SUBLIMAZE) injection 50 mcg (50 mcg Intravenous Given 09/14/15 0249)     Initial Impression / Assessment and Plan / ED Course  Nicholas Hacker, MD has reviewed the triage vital signs and the nursing notes.  Pertinent labs & imaging results that were available during my care of the patient were reviewed by me and considered in my medical decision making (see chart for details).  Clinical Course    Patient presents with shortness of breath. Pulled his trach out prior to arrival. Report upon arrival was that he was satting 100%. On my initial evaluation he was in respiratory distress satting 85%. We did not have replacement tracheostomy at bedside. In preparing for airway, patient appeared to aspirate tube feeds became bradycardic and had a respiratory arrest. He had 90 seconds of CPR as above.  Ultimately, tracheostomy was replaced. He is currently resting comfortably on the  ventilator. From a neurologic standpoint his pupils are equal and reactive. Post CPR, he was arousable but did not follow commands which is not at his baseline. Updated critical care. ABG and lab work was sent. Chest x-ray pending. Patient was covered with clindamycin for aspiration. Lactate elevated. He will be given 30 mL/kg fluid.  Final Clinical Impressions(s) / ED Diagnoses   Final diagnoses:  Respiratory arrest ([place])  Tracheostomy malfunction ([place])   I personally performed the services described in this documentation, which was scribed in my presence. The recorded information has been reviewed and is accurate.   New Prescriptions New Prescriptions   No medications on file     Nicholas Hacker, MD 09/14/15 1791    Nicholas Hacker, MD 09/14/15 [phone]

## 2015-09-14 NOTE — H&P (Signed)
PULMONARY / CRITICAL CARE MEDICINE   Name: RACE LATOUR MRN: [id number] DOB: [birth year]    ADMISSION DATE:  09/14/2015 CONSULTATION DATE:  09/14/2015  REFERRING MD:  Dr. Dina Rich EDP  CHIEF COMPLAINT:  Respiratory arrest  HISTORY OF PRESENT ILLNESS: Patient is encephalopathic and/or intubated. Therefore history has been obtained from chart review.  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 He is followed by [name] in the tracheostomy clinic. He was reportedly at home in his usual state of health when he suddenly developed dyspnea. In effort to improve his breathing he removed his tracheostomy. Dyspnea did not improve so he contacted EMS. Upon their arrival his oxygen saturations were in the low 80s on room air. He is placed on nonrebreather en route and sats improved to 98%. Upon arrival to the emergency department saturations noted to be 85%, however, he suffered an aspiration event which resulted in suspect hypoxemia, bradycardia, and eventually loss of pulses. CPR was initiated and an endotracheal tube was placed and his tracheostomy. Total downtime estimated about 90 seconds. After ROSC he was arousable but was not following commands. Tracheostomy secretions positive for frank blood and tube feeds. He was placed on mechanical ventilation and sedation. PCCM was contacted for admission.   PAST MEDICAL HISTORY :  He  has a past medical history of Allergy; Cancer ([place]) (1999); Cervical spondylolysis (04/30/2011); Cervical spondylosis (03/25/2011); Change in voice; Chronic pain (03/25/2011); Chronic sinusitis (04/27/2011); Depression; Difficulty urinating; Emphysema (03/25/2011); Essential hypertension, benign (12/03/2012); GERD (gastroesophageal reflux disease) (03/25/2011); Hearing loss; Impaired glucose tolerance (04/27/2011); Osteoradionecrosis of jaw (03/25/2011);  Rash; Trouble swallowing; Type II or unspecified type diabetes mellitus without mention of complication, uncontrolled (04/27/2011); Ulcer; UTI (lower urinary tract infection); and Xerostomia (03/25/2011).  PAST SURGICAL HISTORY: He  has a past surgical history that includes PEG tube placement (11/1999); Tracheostomy (11/1999); and Gastrostomy tube placement.  Allergies  Allergen Reactions  . Oxybutynin Chloride Nausea And Vomiting  . Codeine Nausea And Vomiting and Rash    No current facility-administered medications on file prior to encounter.    Current Outpatient Prescriptions on File Prior to Encounter  Medication Sig  . AMBULATORY NON FORMULARY MEDICATION Trach(Shiley 6.0 XLT UP uncuffed), Trach Care Kit, Trach holders, Disposable inner cannulas  . azithromycin (ZITHROMAX) 200 MG/5ML suspension Place 6.3 mLs (250 mg total) into feeding tube daily.  . cefdinir (OMNICEF) 125 MG/5ML suspension Place 12 mLs (300 mg total) into feeding tube 2 (two) times daily.  . cholestyramine light (PREVALITE) 4 G packet Take 1 packet (4 g total) by mouth 3 (three) times daily.  . diphenoxylate-atropine (LOMOTIL) 2.5-0.025 MG tablet Take 1 tablet by mouth 4 (four) times daily as needed for diarrhea or loose stools.  . hydrOXYzine (VISTARIL) 25 MG capsule Take 1 capsule (25 mg total) by mouth 3 (three) times daily as needed.  Marland Kitchen ibuprofen (ADVIL,MOTRIN) 400 MG tablet Take one tablet by mouth three times daily as needed for pain  . losartan (COZAAR) 100 MG tablet take 1 tablet by mouth once daily  . naphazoline-pheniramine (NAPHCON-A) 0.025-0.3 % ophthalmic solution Place 2 drops into both eyes 4 (four) times daily as needed for irritation (red eye).  . Nutritional Supplements (FEEDING SUPPLEMENT, JEVITY 1.2 CAL,) LIQD Place 237 mLs into feeding tube See admin instructions. Pt takes 2 cans at breakfast, 2 cans at lunch, 2 cans at dinner and 2 at night  .  oxymetazoline (12 HOUR NASAL SPRAY) 0.05 % nasal spray  Place 2 sprays into the nose 2 (two) times daily as needed for congestion.   . ranitidine (ZANTAC) 150 MG tablet Take 1 tablet (150 mg total) by mouth 2 (two) times daily.  . tamsulosin (FLOMAX) 0.4 MG CAPS capsule Take 1 capsule (0.4 mg total) by mouth daily.  Marland Kitchen triamcinolone cream (KENALOG) 0.1 % Apply 1 application topically 2 (two) times daily.    FAMILY HISTORY:  His indicated that his mother is deceased. He indicated that his father is deceased. He indicated that both of his sisters are deceased.    SOCIAL HISTORY: He  reports that he quit smoking about 18 years ago. He quit after 23.00 years of use. He has never used smokeless tobacco. He reports that he does not drink alcohol or use drugs.  REVIEW OF SYSTEMS:   Unable as patient is encephalopathic and intubated  SUBJECTIVE:    VITAL SIGNS: BP 113/61   Pulse 106   Temp 99 F (37.2 C) (Oral)   Resp 19   Ht '5\' 7"'$  (1.702 m)   Wt 75.3 kg (166 lb 0.1 oz)   SpO2 100%   BMI 26.00 kg/m   HEMODYNAMICS:    VENTILATOR SETTINGS: Vent Mode: PRVC FiO2 (%):  [100 %] 100 % Set Rate:  [18 bmp] 18 bmp Vt Set:  [500 mL] 500 mL PEEP:  [5 cmH20] 5 cmH20 Plateau Pressure:  [22 cmH20] 22 cmH20  INTAKE / OUTPUT: No intake/output data recorded.  PHYSICAL EXAMINATION: General:  Chronically ill-appearing male in no acute distress on ventilator. Neuro:  Sedated HEENT:  Right-sided jaw deformity, facial edema which is apparently at baseline Cardiovascular:  RRR, no MRG Lungs:  Coarse bilateral breath sounds Abdomen:  Mildly distended, soft Musculoskeletal:  No acute deformity Skin:  Grossly intact  LABS:  BMET  Recent Labs Lab 09/14/15 0308  NA 133*  K 3.9  CL 92*  BUN 21*  CREATININE 1.10  GLUCOSE 253*    Electrolytes No results for input(s): CALCIUM, MG, PHOS in the last 168 hours.  CBC  Recent Labs Lab 09/14/15 0308  HGB 16.7  HCT 49.0    Coag's No results for input(s): APTT, INR in the last 168  hours.  Sepsis Markers  Recent Labs Lab 09/14/15 0309  LATICACIDVEN 5.86*    ABG No results for input(s): PHART, PCO2ART, PO2ART in the last 168 hours.  Liver Enzymes No results for input(s): AST, ALT, ALKPHOS, BILITOT, ALBUMIN in the last 168 hours.  Cardiac Enzymes No results for input(s): TROPONINI, PROBNP in the last 168 hours.  Glucose No results for input(s): GLUCAP in the last 168 hours.  Imaging Dg Chest Portable 1 View  Result Date: 09/14/2015 CLINICAL DATA:  Endotracheal tube.  Aspiration. EXAM: PORTABLE CHEST 1 VIEW COMPARISON:  Chest radiograph Jun 23, 2015 FINDINGS: Cardiomediastinal silhouette is normal. Tracheostomy tube tip projects 2.4 cm above the carina. The lungs are clear without pleural effusions or focal consolidations. Trachea projects midline and there is no pneumothorax. Soft tissue planes and included osseous structures are non-suspicious. Multiple patent wires overlie the patient. Surgical clips project at RIGHT supraclavicular fossa. RIGHT mandible ORIF. IMPRESSION: No acute cardiopulmonary process. Stable appearance of tracheostomy tube. Electronically Signed   By: Elon Alas M.D.   On: 09/14/2015 03:07     STUDIES:    CULTURES: Blood 8/1 > Tracheal asp 8/1 >  ANTIBIOTICS: Unasyn 8/1  SIGNIFICANT EVENTS: 8/1 admit, brief cardiac arrest  LINES/TUBES: #6 cuffed 8/1 >  DISCUSSION:   ASSESSMENT / PLAN:  PULMONARY A: Acute hypoxemic respiratory failure secondary to mucous plugging vs aspiration vs hemoptysis. Tracheostomy status Inability to protect airway secondary to throat cancer history  P:   Full vent support ABG Chest x-ray in the morning Vent bundle Frequent suctioning Chest PT  CARDIOVASCULAR A:  Cardiac arrest > very brief ~90 seconds, primary respiratory H/o HTN  P:  Telemetry Holding home anti-hypertensive medications Trend lactic Trend troponin  RENAL A:   Hyponatremia  P:   NS maintenance   Follow BMP  GASTROINTESTINAL A:   Vomiting  P:   KUB NPO Pepcid  HEMATOLOGIC A:   Hemoptysis > suspect due to airway trauma although some concern that bleeding preceded airway manipulation  P:  Follow CBC Coags  INFECTIOUS A:   Aspiration event  P:   ABX as above Follow cultures  ENDOCRINE A:   DM     P:   CBG monitoring and SSI  NEUROLOGIC A:   Acute metabolic encephalopathy P:   RASS goal: 0 PRN fent/versed   FAMILY  - Updates:   - Inter-disciplinary family meet or Palliative Care meeting due by: 8/7   Georgann Housekeeper, AGACNP-BC [place] [facility] Pager [phone] or [phone]  09/14/2015 3:48 AM   Attending Note:  I have examined patient, reviewed labs, studies and notes. I have discussed the case with Jaclynn Guarneri, and I agree with the data and plans as amended above. 65 yo man, chronic trach and PEG since rx for H&N CA, admitted with acute respiratory failure. Apparently he decanulated on the evening prior to admission after experiencing acute dyspnea at home. No other associated sx reported. He was hypoxemic on initial eval, experienced progressive resp distress in the ED. Noted by RT to have some blood-tinged secretions via stoma. He experienced a witnessed aspiration event w TF's and then brady arrest during attempted trach replacement. ROSC after 90 seconds of CPR. Airway secured with 6.0 ETT via stoma, subsequently changed to XLT 6.0 cuffed trach once stabilized. To the ICU on PRVC. Sedation ordered but not running currently. BP normalized and oxygenation have improved. On my eval he is awake, nods to questions. R mandible hypertrophic. He has B coarse breath sounds. Small amount of blood tinged mucous in his suctioned secretions. His CXR is clear despite reported aspiration event. I suspect that his progressive decompensation was due to the removal of his airway, additional insult of aspirated TF's and airway occlusion. He is at high  risk for aspiration pneumonitis / PNA although no evidence for this at present. Suspect his blood tinged secretions are due to stomal trauma, suctioning. We will follow CXR, cultures and clinical status on empiric Unasyn. Would like to attempt ATC this am when he is more awake given his clear CXR. If he does not return to baseline and if no evidence for evolving PNA then we will need to consider other possible causes for his original complaint of dyspnea - possibly intermittent aspiration, possibly PE.   Independent critical care time is 45 minutes.   Baltazar Apo, MD, PhD 09/14/2015, 6:33 AM [facility] [phone] or if no answer [phone]

## 2015-09-14 NOTE — ED Notes (Signed)
Respiratory at bedside.

## 2015-09-14 NOTE — Progress Notes (Signed)
PULMONARY / CRITICAL CARE MEDICINE   Name: Nicholas Cobb MRN: [id number] DOB: [birth year]    ADMISSION DATE:  09/14/2015 CONSULTATION DATE:  09/14/2015  REFERRING MD:  Dr. Dina Rich EDP  CHIEF COMPLAINT:  Respiratory arrest  HISTORY OF PRESENT ILLNESS: Patient is encephalopathic and/or intubated. Therefore history has been obtained from chart review.  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 He is followed by [name] in the tracheostomy clinic. He was reportedly at home in his usual state of health when he suddenly developed dyspnea. In effort to improve his breathing he removed his tracheostomy. Dyspnea did not improve so he contacted EMS. Upon their arrival his oxygen saturations were in the low 80s on room air. He is placed on nonrebreather en route and sats improved to 98%. Upon arrival to the emergency department saturations noted to be 85%, however, he suffered an aspiration event which resulted in suspect hypoxemia, bradycardia, and eventually loss of pulses. CPR was initiated and an endotracheal tube was placed and his tracheostomy. Total downtime estimated about 90 seconds. After ROSC he was arousable but was not following commands. Tracheostomy secretions positive for frank blood and tube feeds. He was placed on mechanical ventilation and sedation. PCCM was contacted for admission.  SUBJECTIVE:  No events overnight, off vent.  VITAL SIGNS: BP 133/82 (BP Location: Right Arm)   Pulse 79   Temp 98 F (36.7 C) (Oral)   Resp (!) 27   Ht 5\' 7"  (1.702 m)   Wt 75.3 kg (166 lb 0.1 oz)   SpO2 100%   BMI 26.00 kg/m   HEMODYNAMICS:    VENTILATOR SETTINGS: Vent Mode: PRVC FiO2 (%):  [40 %-100 %] 40 % Set Rate:  [18 bmp] 18 bmp Vt Set:  [500 mL] 500 mL PEEP:  [5 cmH20] 5 cmH20 Plateau Pressure:  [22 cmH20] 22 cmH20  INTAKE / OUTPUT: I/O last  3 completed shifts: In: -  Out: 300 [Urine:300]  PHYSICAL EXAMINATION: General:  Chronically ill-appearing male in no acute distress on TC. Neuro:  Alert and interactive, moving all ext to command. HEENT:  Right-sided jaw deformity, facial edema which is apparently at baseline (patient states is radiation burns). Cardiovascular:  RRR, no MRG Lungs:  Coarse bilateral breath sounds Abdomen:  Mildly distended, soft Musculoskeletal:  No acute deformity Skin:  Grossly intact  LABS:  BMET  Recent Labs Lab 09/14/15 0255 09/14/15 0308  NA 131* 133*  K 3.9 3.9  CL 96* 92*  CO2 24  --   BUN 16 21*  CREATININE 1.21 1.10  GLUCOSE 252* 253*   Electrolytes  Recent Labs Lab 09/14/15 0255 09/14/15 0440  CALCIUM 9.0  --   MG  --  2.4  PHOS  --  5.8*   CBC  Recent Labs Lab 09/14/15 0255 09/14/15 0308  WBC 11.5*  --   HGB 14.8 16.7  HCT 45.5 49.0  PLT 231  --    Coag's  Recent Labs Lab 09/14/15 0440  INR 1.00   Sepsis Markers  Recent Labs Lab 09/14/15 0309 09/14/15 0524 09/14/15 0535 09/14/15 0630  LATICACIDVEN 5.86* 3.12* 3.2*  --   PROCALCITON  --   --   --  0.10   ABG  Recent Labs Lab 09/14/15 0321  PHART 7.243*  PCO2ART 60.1*  PO2ART 283.0*   Liver Enzymes No results for input(s): AST, ALT, ALKPHOS, BILITOT, ALBUMIN in  the last 168 hours.  Cardiac Enzymes  Recent Labs Lab 09/14/15 0440  TROPONINI <0.03   Glucose  Recent Labs Lab 09/14/15 0416 09/14/15 0559 09/14/15 0716  GLUCAP 139* 124* 134*   Imaging Dg Chest Portable 1 View  Result Date: 09/14/2015 CLINICAL DATA:  Endotracheal tube.  Aspiration. EXAM: PORTABLE CHEST 1 VIEW COMPARISON:  Chest radiograph Jun 23, 2015 FINDINGS: Cardiomediastinal silhouette is normal. Tracheostomy tube tip projects 2.4 cm above the carina. The lungs are clear without pleural effusions or focal consolidations. Trachea projects midline and there is no pneumothorax. Soft tissue planes and included  osseous structures are non-suspicious. Multiple patent wires overlie the patient. Surgical clips project at RIGHT supraclavicular fossa. RIGHT mandible ORIF. IMPRESSION: No acute cardiopulmonary process. Stable appearance of tracheostomy tube. Electronically Signed   By: Elon Alas M.D.   On: 09/14/2015 03:07   Dg Abd Portable 1v  Result Date: 09/14/2015 CLINICAL DATA:  Vomiting EXAM: PORTABLE ABDOMEN - 1 VIEW COMPARISON:  February 13, 2015 FINDINGS: Gastrostomy catheter in region of stomach. There is no bowel dilatation or air-fluid level suggesting obstruction. No free air is seen on this supine examination. There are phleboliths in the pelvis. There is moderate stool in the colon. IMPRESSION: No bowel obstruction or free air evident on this supine examination. Gastrostomy catheter in the region of the stomach. Electronically Signed   By: Lowella Grip III M.D.   On: 09/14/2015 07:48   STUDIES:    CULTURES: Blood 8/1 > Tracheal asp 8/1 >  ANTIBIOTICS: Unasyn 8/1  SIGNIFICANT EVENTS: 8/1 admit, brief cardiac arrest  LINES/TUBES: #6 cuffed 8/1 >  DISCUSSION: 65 year old male with PMH of laryngeal cancer presenting with SOB where he pulled his trach then had a brief arrest.  ASSESSMENT / PLAN:  PULMONARY A: Acute hypoxemic respiratory failure secondary to mucous plugging vs aspiration vs hemoptysis. Tracheostomy status Inability to protect airway secondary to throat cancer history  P:   TC as tolerated. D/C vent from room. Chest x-ray PRN. Suctioning as needed. Chest PT Robitussin and saline mist.  CARDIOVASCULAR A:  Cardiac arrest > very brief ~90 seconds, primary respiratory H/o HTN  P:  Telemetry. Restart home cozaar at half dose.  RENAL A:   Hyponatremia  P:   KVO IVF. Follow BMP Replace electrolytes as indicated.  GASTROINTESTINAL A:   Vomiting  P:   KUB TF per nutrition Pepcid  HEMATOLOGIC A:   Hemoptysis > suspect due to airway  trauma although some concern that bleeding preceded airway manipulation  P:  Follow CBC Coags  INFECTIOUS A:   Aspiration event  P:   ABX as above Follow cultures  ENDOCRINE A:   DM     P:   CBG monitoring and SSI  NEUROLOGIC A:   Acute metabolic encephalopathy P:   D/C all sedation. PT/OT OOB to chair Ambulate.  FAMILY  - Updates: Patient updated bedside.  - Inter-disciplinary family meet or Palliative Care meeting due by: 8/7  The patient is critically ill with multiple organ systems failure and requires high complexity decision making for assessment and support, frequent evaluation and titration of therapies, application of advanced monitoring technologies and extensive interpretation of multiple databases.   Critical Care Time devoted to patient care services described in this note is  35  Minutes. This time reflects time of care of this signee Dr Jennet Maduro. This critical care time does not reflect procedure time, or teaching time or supervisory time of PA/NP/Med student/Med Resident  etc but could involve care discussion time.  Rush Farmer, M.D. [facility] Pulmonary/Critical Care Medicine. Pager: [phone]. After hours pager: [phone].

## 2015-09-14 NOTE — ED Notes (Signed)
Patient was having trach put back in by respiratory and then went pulseless. This RN, Fenton Malling RN, MD Horton, 2 techs, and respiratory was present.  Code lasted approx 2 min.  Pulses returned and breathing on his own.  Moved to trauma C and MD placed new trach.  Critical care called to see patient.  Will continue to monitor.

## 2015-09-14 NOTE — Progress Notes (Signed)
Pt is alert, nods appropriately. Upon assessment no extra supplement for trach being found at the bedside. RN stated she spoke to RT.  RT called again. Stated RN needs to order respiratory supplies. The needed supplies were ordered by RN STAT.  Bed in low position, alarms are on, call bell in reach, camera is on, frequent observation and reassurance provided.

## 2015-09-14 NOTE — Progress Notes (Signed)
Pharmacy Antibiotic Note  Nicholas Cobb is a 65 y.o. male admitted on 09/14/2015 with asp pna.  Pharmacy has been consulted for Unasyn dosing. Noted pt presented after trach pulled out, pt lost pulse and required CPR with ROSC ~90 sec later.  Plan: Unasyn 3gm IV q6h Will f/u micro data, renal function, and pt's clinical condition   Height: 5\' 7"  (170.2 cm) Weight: 166 lb 0.1 oz (75.3 kg) IBW/kg (Calculated) : 66.1  Temp (24hrs), Avg:99 F (37.2 C), Min:99 F (37.2 C), Max:99 F (37.2 C)   Recent Labs Lab 09/14/15 0255 09/14/15 0308 09/14/15 0309  WBC 11.5*  --   --   CREATININE 1.21 1.10  --   LATICACIDVEN  --   --  5.86*    Estimated Creatinine Clearance: 60.9 mL/min (by C-G formula based on SCr of 1.1 mg/dL).    Allergies  Allergen Reactions  . Oxybutynin Chloride Nausea And Vomiting  . Codeine Nausea And Vomiting and Rash    Antimicrobials this admission: 8/1 Unasyn >>   Microbiology results: 8/1 BCx x2:   Thank you for allowing pharmacy to be a part of this patient's care.  Sherlon Handing, PharmD, BCPS Clinical pharmacist, pager [phone] 09/14/2015 3:40 AM

## 2015-09-14 NOTE — Progress Notes (Signed)
Initial Nutrition Assessment  DOCUMENTATION CODES:   Not applicable  INTERVENTION:    Initiate TF via PEG with Jevity 1.2 at goal rate of 65 ml/h (1560 ml per day) and Prostat 30 ml once daily to provide 1972 kcals, 102 gm protein, 1264 ml free water daily.  Will need 200 ml free water flushes QID for total free water intake of 2064 ml per day.  NUTRITION DIAGNOSIS:   Inadequate oral intake related to inability to eat as evidenced by NPO status.  GOAL:   Patient will meet greater than or equal to 90% of their needs  MONITOR:   Vent status, Labs, TF tolerance, I & O's  REASON FOR ASSESSMENT:   Consult Enteral/tube feeding initiation and management  ASSESSMENT:   65 year old male with PMH of laryngeal cancer presenting with SOB where he pulled his trach then had a brief arrest.  Patient has had a trach and PEG since October 2001. Patient communicates by writing notes; says that he usually receives 2 cans of Jevity 1.2 every 8 hours. Will start with continuous feedings while he is in the ICU.  Required vent support over night, but currently on trach collar.  Labs reviewed: sodium low CBG's: [phone] Medications reviewed and include Novolog Nutrition-Focused physical exam completed. Findings are no fat depletion, no muscle depletion, and no edema.   Diet Order:  Diet NPO time specified  Skin:  Reviewed, no issues  Last BM:  unknown  Height:   Ht Readings from Last 1 Encounters:  09/14/15 5\' 7"  (1.702 m)    Weight:   Wt Readings from Last 1 Encounters:  09/14/15 166 lb 0.1 oz (75.3 kg)    Ideal Body Weight:  67.3 kg  BMI:  Body mass index is 26 kg/m.  Estimated Nutritional Needs:   Kcal:  1800-2000  Protein:  90-110 gm  Fluid:  2 L  EDUCATION NEEDS:   No education needs identified at this time  Molli Barrows, [place], [place], [place] Pager [phone] After Hours Pager [phone]

## 2015-09-14 NOTE — ED Notes (Signed)
Called carelink to activate code sepsis  

## 2015-09-14 NOTE — ED Triage Notes (Signed)
Pt arrived via [facility] c/o trach coming out, 82% on RA, EMS placed on NRB 98%.

## 2015-09-14 NOTE — Progress Notes (Signed)
The trach supplies are at the bedside.  Bed in low position, alarms are on, denies pain. Call bell in reach. Paper and pen at the bedside for communication

## 2015-09-14 NOTE — Progress Notes (Signed)
eLink Physician-Brief Progress Note Patient Name: Nicholas Cobb DOB: 04-Feb-1951 MRN: [id number]   Date of Service  09/14/2015  HPI/Events of Note  Pt admitted from ED. Seen by PCCM.   Pt with chronic trache which was removed by pt at home 2/2 SOB.  Went into resp arrest as a new trache was being placed. Brief arrest. TF seen per trache.  After the arrest, BP dropped in the 70s after he was placed on the vent with propofol.  Hypotension likely 2/2 propofol.   BP bounced back.  He also got resuscitated with 2.5 L of IVF as part of sepsis protocol.   Pt seen. Asleep. Not in distress. Was awake earlier per RN.  I think he is slowly getting  back to his baseline. 120/80, 82, 18  100%.  Per RN > good pulses and good capillary refill.   Repeat sepsis assessment completed.   Pt being treated as asp pna vs pneumonitis.  I don't think he is septic.  Pt with chronic trache.     eICU Interventions  Plan : told RN and RT to do trache collar when he is more awake. No need for more fluid resuscitation > will just keep on maintenance fluids. Cont abx pending culture. Rpt lactate. Check pct.       Intervention Category Evaluation Type: New Patient Evaluation  Rush Landmark 09/14/2015, 6:04 AM

## 2015-09-14 NOTE — ED Notes (Signed)
MD Horton informed of decreased blood pressure readings.

## 2015-09-14 NOTE — Progress Notes (Signed)
Received patient to ED via EMS secondary to his removal of the trach. Patient said he removed the trach himself because he had difficulty breathing. On initial eval, patient sats was at 100% with a non rebreather placed over the stoma. RT was gathering equipment to clean patient old trach when he starts coughing up frank bloody secretions. Patient appeared to be more and more distress after RT attempted to suction the stoma. Subsequently patient sats dropped to the low 80's, at this point, Dr Dina Rich was notified of the patient's need for an airway. See MD note.

## 2015-09-14 NOTE — Care Management Note (Signed)
Case Management Note  Patient Details  Name: CAESON GALINSKY MRN: [id number] Date of Birth: 19-Feb-1950  Subjective/Objective:   Pt admitted with resp arrest         Action/Plan:  PTA from home independent with chronic trach since 2001.  Pt alert and oriented and communicated with CM via pen and paper.  Pt stated he has a ENT and Pulmonary MD (cares for trach) in [facility] - previously affiliated with [facility] and [facility].  Pt communicated that he receives trach supplies from [facility] and that he has all the supplies that he needs.  Pt denied barriers to returning home, he uses [employer] for transportation.  CM will continue to follow for discharge    Expected Discharge Date:                  Expected Discharge Plan:  Home/Self Care  In-House Referral:     Discharge planning Services  CM Consult  Post Acute Care Choice:    Choice offered to:     DME Arranged:    DME Agency:     HH Arranged:    HH Agency:     Status of Service:  In process, will continue to follow  If discussed at Long Length of Stay Meetings, dates discussed:    Additional Comments:  Maryclare Labrador, RN 09/14/2015, 1:43 PM

## 2015-09-15 ENCOUNTER — Inpatient Hospital Stay (HOSPITAL_COMMUNITY): Payer: Medicare Other

## 2015-09-15 DIAGNOSIS — J9601 Acute respiratory failure with hypoxia: Secondary | ICD-10-CM

## 2015-09-15 LAB — GLUCOSE, CAPILLARY
GLUCOSE-CAPILLARY: 124 mg/dL — AB (ref 65–99)
GLUCOSE-CAPILLARY: 89 mg/dL (ref 65–99)
Glucose-Capillary: 132 mg/dL — ABNORMAL HIGH (ref 65–99)
Glucose-Capillary: 145 mg/dL — ABNORMAL HIGH (ref 65–99)
Glucose-Capillary: 109 mg/dL — ABNORMAL HIGH (ref 65–99)

## 2015-09-15 LAB — BLOOD CULTURE ID PANEL (REFLEXED)
ACINETOBACTER BAUMANNII: NOT DETECTED
CARBAPENEM RESISTANCE: NOT DETECTED
Candida albicans: NOT DETECTED
Candida glabrata: NOT DETECTED
Candida krusei: NOT DETECTED
Candida parapsilosis: NOT DETECTED
Candida tropicalis: NOT DETECTED
Enterobacter cloacae complex: NOT DETECTED
Enterobacteriaceae species: NOT DETECTED
Enterococcus species: NOT DETECTED
Escherichia coli: NOT DETECTED
HAEMOPHILUS INFLUENZAE: NOT DETECTED
Klebsiella oxytoca: NOT DETECTED
Klebsiella pneumoniae: NOT DETECTED
LISTERIA MONOCYTOGENES: NOT DETECTED
METHICILLIN RESISTANCE: NOT DETECTED
NEISSERIA MENINGITIDIS: NOT DETECTED
Proteus species: NOT DETECTED
Pseudomonas aeruginosa: NOT DETECTED
SERRATIA MARCESCENS: NOT DETECTED
STAPHYLOCOCCUS AUREUS BCID: NOT DETECTED
STAPHYLOCOCCUS SPECIES: NOT DETECTED
STREPTOCOCCUS PYOGENES: NOT DETECTED
STREPTOCOCCUS SPECIES: NOT DETECTED
Streptococcus agalactiae: NOT DETECTED
Streptococcus pneumoniae: NOT DETECTED
Vancomycin resistance: NOT DETECTED

## 2015-09-15 LAB — CBC
HCT: 41.8 % (ref 39.0–52.0)
HEMOGLOBIN: 14 g/dL (ref 13.0–17.0)
MCH: 30.9 pg (ref 26.0–34.0)
MCHC: 33.5 g/dL (ref 30.0–36.0)
MCV: 92.3 fL (ref 78.0–100.0)
PLATELETS: 193 10*3/uL (ref 150–400)
RBC: 4.53 MIL/uL (ref 4.22–5.81)
RDW: 13.1 % (ref 11.5–15.5)
WBC: 10.6 10*3/uL — ABNORMAL HIGH (ref 4.0–10.5)

## 2015-09-15 LAB — BASIC METABOLIC PANEL
Anion gap: 7 (ref 5–15)
BUN: 12 mg/dL (ref 6–20)
CALCIUM: 9 mg/dL (ref 8.9–10.3)
CHLORIDE: 99 mmol/L — AB (ref 101–111)
CO2: 27 mmol/L (ref 22–32)
CREATININE: 0.91 mg/dL (ref 0.61–1.24)
Glucose, Bld: 128 mg/dL — ABNORMAL HIGH (ref 65–99)
Potassium: 3.7 mmol/L (ref 3.5–5.1)
SODIUM: 133 mmol/L — AB (ref 135–145)

## 2015-09-15 LAB — URINE CULTURE: Culture: NO GROWTH

## 2015-09-15 LAB — PHOSPHORUS: PHOSPHORUS: 2.9 mg/dL (ref 2.5–4.6)

## 2015-09-15 LAB — PROCALCITONIN: PROCALCITONIN: 0.25 ng/mL

## 2015-09-15 LAB — MAGNESIUM: MAGNESIUM: 2 mg/dL (ref 1.7–2.4)

## 2015-09-15 MED ORDER — HYDROXYZINE HCL 10 MG/5ML PO SYRP: 25.0000 mg | ORAL_SOLUTION | Freq: Three times a day (TID) | ORAL | Status: DC | PRN

## 2015-09-15 MED ORDER — TAMSULOSIN HCL 0.4 MG PO CAPS: 0.4000 mg | ORAL_CAPSULE | Freq: Every day | ORAL | Status: DC

## 2015-09-15 MED ORDER — DIPHENOXYLATE-ATROPINE 2.5-0.025 MG PO TABS: 1.0000 | ORAL_TABLET | Freq: Four times a day (QID) | ORAL | Status: DC | PRN

## 2015-09-15 MED ORDER — WHITE PETROLATUM GEL
Status: AC
  Administered 2015-09-15: 11:00:00
  Filled 2015-09-15: qty 1

## 2015-09-15 MED ORDER — ACETAMINOPHEN 160 MG/5ML PO SOLN
650.0000 mg | Freq: Four times a day (QID) | ORAL | Status: DC | PRN
  Administered 2015-09-15: 650 mg
  Filled 2015-09-15: qty 20.3

## 2015-09-15 MED ORDER — LOSARTAN POTASSIUM 50 MG PO TABS
100.0000 mg | ORAL_TABLET | Freq: Every day | ORAL | Status: DC
  Administered 2015-09-15 – 2015-09-17 (×3): 100 mg
  Filled 2015-09-15 (×3): qty 2
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
## 2015-09-15 NOTE — Evaluation (Signed)
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 He was reportedly at home in his usual state of health when he suddenly developed dyspnea. In effort to improve his breathing he removed his tracheostomy. Dyspnea did not improve so he contacted EMS. In ED saturations noted to be 85%, however, he suffered an aspiration event which resulted in suspect hypoxemia, bradycardia, and eventually loss of pulses. CPR was initiated and an endotracheal tube was placed and his tracheostomy. Total downtime estimated about 90 seconds. After ROSC he was arousable but was not following commands. Tracheostomy secretions positive for frank blood and tube feeds. He was placed on mechanical ventilation and sedation. Change to trach collar 8/1    Clinical Impression  Patient evaluated by Physical Therapy with no further acute PT needs identified.  Patient maintained SaO2 >91% on room air during activity. ?cognitive status as patient would not address question at hand (ex. 1. Do you have home oxygen? Answered at length re: his "central air" and temperature he uses it, etc. 2. Did you feel short of breath while walking? Answered left chest pain when he coughs and that his face is swollen due to too much radiation) PT is signing off as pt is independent from a mobility/balance perpective. Thank you for this referral.     Follow Up Recommendations No PT follow up    Equipment Recommendations  None recommended by PT    Recommendations for Other Services       Precautions / Restrictions Precautions Precautions: Other (comment) Precaution Comments: trach, PEG Restrictions Weight Bearing Restrictions: No      Mobility  Bed Mobility                   Transfers Overall transfer level: Independent Equipment used: None                Ambulation/Gait Ambulation/Gait assistance: Independent Ambulation Distance (Feet): 120 Feet Assistive device: None Gait Pattern/deviations: WFL(Within Functional Limits)        Stairs            Wheelchair Mobility    Modified Rankin (Stroke Patients Only)       Balance Overall balance assessment: Independent (rhomberg eyes closed x 30 seconds)                                           Pertinent Vitals/Pain On 28% trach collar SaO2 99%; ambulated on room air with SaO2>91%   Pain Assessment: No/denies pain    Home Living Family/patient expects to be discharged to:: Private residence Living Arrangements: Alone Available Help at Discharge: Friend(s);Available PRN/intermittently           Home Equipment: None      Prior Function Level of Independence: Independent         Comments: states he suctions himself; does trach care; later states nurse comes to check his "signs" (he meant BP, etc)     Hand Dominance   Dominant Hand: Right    Extremity/Trunk Assessment   Upper Extremity Assessment: Overall WFL for tasks assessed           Lower Extremity Assessment: Overall WFL for tasks assessed  Communication   Communication: Other (comment);Tracheostomy (writes)  Cognition Arousal/Alertness: Awake/alert Behavior During Therapy: WFL for tasks assessed/performed Overall Cognitive Status: Difficult to assess                      General Comments General comments (skin integrity, edema, etc.): Prolonged session as pt writes to communicate and is verbose (sometimes off-topic). Insisted RN come to suction his trach prior to activity despite SaO2 97% on RA.    Exercises        Assessment/Plan    PT Assessment Patent does not need any further PT services  PT Diagnosis     PT Problem List    PT Treatment  Interventions     PT Goals (Current goals can be found in the Care Plan section) Acute Rehab PT Goals PT Goal Formulation: All assessment and education complete, DC therapy    Frequency     Barriers to discharge        Co-evaluation               End of Session Equipment Utilized During Treatment: Gait belt Activity Tolerance: Patient tolerated treatment well Patient left: in chair;with call bell/phone within reach Nurse Communication: Mobility status;Other (comment) (? cognition)         Time: [id number] PT Time Calculation (min) (ACUTE ONLY): 51 min   Charges:   PT Evaluation $PT Eval Moderate Complexity: 1 Procedure     PT G Codes:        Drayton Tieu 2015/09/18, 9:34 AM  Pager [phone]

## 2015-09-15 NOTE — Progress Notes (Signed)
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 He was reportedly at home in his usual state of health when he suddenly developed dyspnea. In an effort to improve his breathing he removed his tracheostomy. Dyspnea did not improve so he contacted EMS. Upon their arrival his oxygen saturations were in the low 80s on room air. He was placed on nonrebreather en route and sats improved to 98%. Upon arrival to the emergency department saturations noted to be 85%, however, he suffered an aspiration event which resulted in hypoxemia, bradycardia, and eventually loss of pulses. CPR was initiated and an endotracheal tube was placed in his tracheostomy. Total downtime estimated about 90 seconds. After ROSC he was arousable but was not following commands. Tracheostomy secretions positive for frank blood and tube feeds. He was placed on mechanical ventilation and sedation. PCCM was contacted for admission.  Subjective: The pt has been walking around the ICU w/ assistance.  He is anxious to go home.  He denies cp, sob, n/v, or abdom pain.  He had a bowel movement today.  He has not experienced any further hemoptysis.    Assessment & Plan:  Acute hypoxemic respiratory failure secondary to mucous plugging vs aspiration vs hemoptysis - chronic trach Trach care per PCCM - appears to be stable at present - to cont to f/u w/ Marni Griffon, NP in the [facility]    Aspiration event To complete 3 days of empiric abx tx - no evidence of ongoing infection at this time   Hemoptysis suspect due to airway trauma although some concern that bleeding preceded airway manipulation - has not recurred   Acute metabolic  encephalopathy Appears to be steadily improving - due to acute severe illness/arrest - transfer out of ICU and follow  ?Gram + rod bacteremia - 1 of 2 blood cx  Suspect contaminant - f/u speciation   Hx of throat cancer  Cardiac arrest ~90 seconds - primary respiratory - no further eval indicated at this time   HTN  BP poorly controlled, but pt is also agitated - adjust tx and follow  Hyponatremia Likely related to hyperglycemia as well as some volume depletion - address each of these issues and follow    DM CBG currently well controlled - follow   DVT prophylaxis: SCDs only (hemoptysis) Code Status: FULL CODE Family Communication: spoke w/ daughter at bedside  Disposition Plan: transfer to tele bed - possible d/c home in 24-48hrs if mental status clear, blood cx most c/w contaminant, tolerating tube feeds, does well w/ PT/OT, CBG controlled   Consultants:  PCCM  Procedures: none  Antimicrobials:  Unasyn 7/31 >  Objective: Blood pressure (!) 155/86, pulse 82, temperature 99.5 F (37.5 C), temperature source Oral, resp. rate 16, height _0  (1.702 m), weight 79 kg (174 lb 2.6 oz), SpO2 96 %.  Intake/Output Summary (Last 24 hours) at 09/15/15 0911 Last data filed at 09/15/15 0600  Gross per 24 hour  Intake            904.5 ml  Output             1625 ml  Net           -720.5 ml  Filed Weights   09/14/15 0250 09/15/15 0500  Weight: 75.3 kg (166 lb 0.1 oz) 79 kg (174 lb 2.6 oz)    Examination: General: No acute respiratory distress at rest in bedside chair  Lungs: Clear to auscultation bilaterally without wheezes or crackles Cardiovascular: Regular rate and rhythm without murmur gallop or rub normal S1 and S2 Abdomen: Nontender, nondistended, soft, bowel sounds positive, no rebound, no ascites, no appreciable mass Extremities: No significant cyanosis, clubbing, or edema bilateral lower extremities  CBC:  Recent Labs Lab 09/14/15 0255 09/14/15 0308  09/15/15 0205  WBC 11.5*  --  10.6*  NEUTROABS 7.4  --   --   HGB 14.8 16.7 14.0  HCT 45.5 49.0 41.8  MCV 92.5  --  92.3  PLT 231  --  224   Basic Metabolic Panel:  Recent Labs Lab 09/14/15 0255 09/14/15 0308 09/14/15 0440 09/15/15 0205  NA 131* 133*  --  133*  K 3.9 3.9  --  3.7  CL 96* 92*  --  99*  CO2 24  --   --  27  GLUCOSE 252* 253*  --  128*  BUN 16 21*  --  12  CREATININE 1.21 1.10  --  0.91  CALCIUM 9.0  --   --  9.0  MG  --   --  2.4 2.0  PHOS  --   --  5.8* 2.9   GFR: Estimated Creatinine Clearance: 73.6 mL/min (by C-G formula based on SCr of 0.91 mg/dL).  Liver Function Tests: No results for input(s): AST, ALT, ALKPHOS, BILITOT, PROT, ALBUMIN in the last 168 hours. No results for input(s): LIPASE, AMYLASE in the last 168 hours. No results for input(s): AMMONIA in the last 168 hours.  Coagulation Profile:  Recent Labs Lab 09/14/15 0440  INR 1.00    Cardiac Enzymes:  Recent Labs Lab 09/14/15 0440 09/14/15 0929 09/14/15 1444  TROPONINI <0.03 0.05* 0.06*    HbA1C: Hgb A1c MFr Bld  Date/Time Value Ref Range Status  03/24/2014 08:35 AM 6.6 (H) 4.8 - 5.6 % Final    Comment:             Pre-diabetes: 5.7 - 6.4          Diabetes: >6.4          Glycemic control for adults with diabetes: <7.0   10/29/2013 11:18 AM 6.8 (H) 4.6 - 6.5 % Final    Comment:    Glycemic Control Guidelines for People with Diabetes:Non Diabetic:  <6%Goal of Therapy: <7%Additional Action Suggested:  >8%     CBG:  Recent Labs Lab 09/14/15 1553 09/14/15 2019 09/14/15 2303 09/15/15 0421 09/15/15 0714  GLUCAP 85 102* 110* 132* 145*    Recent Results (from the past 240 hour(s))  Blood culture (routine x 2)     Status: None (Preliminary result)   Collection Time: 09/14/15  2:50 AM  Result Value Ref Range Status   Specimen Description BLOOD LEFT ANTECUBITAL  Final   Special Requests BOTTLES DRAWN AEROBIC ONLY 5CC  Final   Culture NO GROWTH < 12 HOURS  Final    Report Status PENDING  Incomplete  Blood culture (routine x 2)     Status: None (Preliminary result)   Collection Time: 09/14/15  2:55 AM  Result Value Ref Range Status   Specimen Description BLOOD LEFT HAND  Final   Special Requests BOTTLES DRAWN AEROBIC ONLY 5CC  Final   Culture  Setup Time   Final  GRAM POSITIVE RODS AEROBIC BOTTLE ONLY Organism ID to follow CRITICAL RESULT CALLED TO, READ BACK BY AND VERIFIED WITH: Karsten Ro PHARMD [id number] 09/15/15 A BROWNING    Culture NO GROWTH < 12 HOURS  Final   Report Status PENDING  Incomplete  Blood Culture ID Panel (Reflexed)     Status: None   Collection Time: 09/14/15  2:55 AM  Result Value Ref Range Status   Enterococcus species NOT DETECTED NOT DETECTED Final   Vancomycin resistance NOT DETECTED NOT DETECTED Final   Listeria monocytogenes NOT DETECTED NOT DETECTED Final   Staphylococcus species NOT DETECTED NOT DETECTED Final   Staphylococcus aureus NOT DETECTED NOT DETECTED Final   Methicillin resistance NOT DETECTED NOT DETECTED Final   Streptococcus species NOT DETECTED NOT DETECTED Final   Streptococcus agalactiae NOT DETECTED NOT DETECTED Final   Streptococcus pneumoniae NOT DETECTED NOT DETECTED Final   Streptococcus pyogenes NOT DETECTED NOT DETECTED Final   Acinetobacter baumannii NOT DETECTED NOT DETECTED Final   Enterobacteriaceae species NOT DETECTED NOT DETECTED Final   Enterobacter cloacae complex NOT DETECTED NOT DETECTED Final   Escherichia coli NOT DETECTED NOT DETECTED Final   Klebsiella oxytoca NOT DETECTED NOT DETECTED Final   Klebsiella pneumoniae NOT DETECTED NOT DETECTED Final   Proteus species NOT DETECTED NOT DETECTED Final   Serratia marcescens NOT DETECTED NOT DETECTED Final   Carbapenem resistance NOT DETECTED NOT DETECTED Final   Haemophilus influenzae NOT DETECTED NOT DETECTED Final   Neisseria meningitidis NOT DETECTED NOT DETECTED Final   Pseudomonas aeruginosa NOT DETECTED NOT DETECTED Final    Candida albicans NOT DETECTED NOT DETECTED Final   Candida glabrata NOT DETECTED NOT DETECTED Final   Candida krusei NOT DETECTED NOT DETECTED Final   Candida parapsilosis NOT DETECTED NOT DETECTED Final   Candida tropicalis NOT DETECTED NOT DETECTED Final  MRSA PCR Screening     Status: None   Collection Time: 09/14/15  5:50 AM  Result Value Ref Range Status   MRSA by PCR NEGATIVE NEGATIVE Final    Comment:        The GeneXpert MRSA Assay (FDA approved for NASAL specimens only), is one component of a comprehensive MRSA colonization surveillance program. It is not intended to diagnose MRSA infection nor to guide or monitor treatment for MRSA infections.      Scheduled Meds: . ampicillin-sulbactam (UNASYN) IV  3 g Intravenous Q6H  . antiseptic oral rinse  7 mL Mouth Rinse QID  . chlorhexidine gluconate (SAGE KIT)  15 mL Mouth Rinse BID  . famotidine  20 mg Per Tube BID  . feeding supplement (PRO-STAT SUGAR FREE 64)  30 mL Per Tube Daily  . free water  200 mL Per Tube QID  . insulin aspart  2-6 Units Subcutaneous Q4H  . losartan  50 mg Oral Daily  . naphazoline-pheniramine  2 drop Both Eyes BID  . tamsulosin  0.4 mg Oral QPC supper   Continuous Infusions: . sodium chloride Stopped (09/14/15 0900)  . feeding supplement (JEVITY 1.2 CAL) 1,000 mL (09/14/15 2000)     LOS: 1 day    Cherene Altes, MD [facility] Office  [phone] Pager - Text Page per Amion as per below:  On-Call/Text Page:      [email or web address]      password [email or web address]  If 7PM-7AM, please contact night-coverage [email or web address] Password [email or web address] 09/15/2015, 9:11 AM

## 2015-09-15 NOTE — Progress Notes (Signed)
PHARMACY - PHYSICIAN COMMUNICATION CRITICAL VALUE ALERT - BLOOD CULTURE IDENTIFICATION (BCID)  Gram + Rods in aerobic bottles (BCID didn't detect anything)  Results for orders placed or performed during the hospital encounter of 09/14/15  Blood Culture ID Panel (Reflexed) (Collected: 09/14/2015  2:55 AM)  Result Value Ref Range   Enterococcus species NOT DETECTED NOT DETECTED   Vancomycin resistance NOT DETECTED NOT DETECTED   Listeria monocytogenes NOT DETECTED NOT DETECTED   Staphylococcus species NOT DETECTED NOT DETECTED   Staphylococcus aureus NOT DETECTED NOT DETECTED   Methicillin resistance NOT DETECTED NOT DETECTED   Streptococcus species NOT DETECTED NOT DETECTED   Streptococcus agalactiae NOT DETECTED NOT DETECTED   Streptococcus pneumoniae NOT DETECTED NOT DETECTED   Streptococcus pyogenes NOT DETECTED NOT DETECTED   Acinetobacter baumannii NOT DETECTED NOT DETECTED   Enterobacteriaceae species NOT DETECTED NOT DETECTED   Enterobacter cloacae complex NOT DETECTED NOT DETECTED   Escherichia coli NOT DETECTED NOT DETECTED   Klebsiella oxytoca NOT DETECTED NOT DETECTED   Klebsiella pneumoniae NOT DETECTED NOT DETECTED   Proteus species NOT DETECTED NOT DETECTED   Serratia marcescens NOT DETECTED NOT DETECTED   Carbapenem resistance NOT DETECTED NOT DETECTED   Haemophilus influenzae NOT DETECTED NOT DETECTED   Neisseria meningitidis NOT DETECTED NOT DETECTED   Pseudomonas aeruginosa NOT DETECTED NOT DETECTED   Candida albicans NOT DETECTED NOT DETECTED   Candida glabrata NOT DETECTED NOT DETECTED   Candida krusei NOT DETECTED NOT DETECTED   Candida parapsilosis NOT DETECTED NOT DETECTED   Candida tropicalis NOT DETECTED NOT DETECTED    Name of physician (or Provider) Contacted: Dr. Corrie Dandy [facility])  Changes to prescribed antibiotics required: No changes, likely contaminant   Narda Bonds 09/15/2015  4:50 AM

## 2015-09-16 DIAGNOSIS — J690 Pneumonitis due to inhalation of food and vomit: Secondary | ICD-10-CM | POA: Diagnosis present

## 2015-09-16 DIAGNOSIS — I10 Essential (primary) hypertension: Secondary | ICD-10-CM

## 2015-09-16 DIAGNOSIS — C140 Malignant neoplasm of pharynx, unspecified: Secondary | ICD-10-CM | POA: Diagnosis present

## 2015-09-16 DIAGNOSIS — J9503 Malfunction of tracheostomy stoma: Secondary | ICD-10-CM | POA: Diagnosis present

## 2015-09-16 DIAGNOSIS — F411 Generalized anxiety disorder: Secondary | ICD-10-CM

## 2015-09-16 DIAGNOSIS — I469 Cardiac arrest, cause unspecified: Secondary | ICD-10-CM | POA: Diagnosis present

## 2015-09-16 DIAGNOSIS — E118 Type 2 diabetes mellitus with unspecified complications: Secondary | ICD-10-CM | POA: Diagnosis present

## 2015-09-16 LAB — GLUCOSE, CAPILLARY
GLUCOSE-CAPILLARY: 128 mg/dL — AB (ref 65–99)
GLUCOSE-CAPILLARY: 98 mg/dL (ref 65–99)
GLUCOSE-CAPILLARY: 113 mg/dL — AB (ref 65–99)
Glucose-Capillary: 123 mg/dL — ABNORMAL HIGH (ref 65–99)
Glucose-Capillary: 139 mg/dL — ABNORMAL HIGH (ref 65–99)
Glucose-Capillary: 90 mg/dL (ref 65–99)

## 2015-09-16 LAB — COMPREHENSIVE METABOLIC PANEL
ALT: 67 U/L — AB (ref 17–63)
AST: 79 U/L — AB (ref 15–41)
Albumin: 3 g/dL — ABNORMAL LOW (ref 3.5–5.0)
Alkaline Phosphatase: 101 U/L (ref 38–126)
Anion gap: 6 (ref 5–15)
BILIRUBIN TOTAL: 0.4 mg/dL (ref 0.3–1.2)
BUN: 11 mg/dL (ref 6–20)
CO2: 29 mmol/L (ref 22–32)
CREATININE: 0.9 mg/dL (ref 0.61–1.24)
Calcium: 9.1 mg/dL (ref 8.9–10.3)
Chloride: 98 mmol/L — ABNORMAL LOW (ref 101–111)
GFR calc Af Amer: >60 mL/min (ref >60)
Glucose, Bld: 130 mg/dL — ABNORMAL HIGH (ref 65–99)
POTASSIUM: 3.7 mmol/L (ref 3.5–5.1)
Sodium: 133 mmol/L — ABNORMAL LOW (ref 135–145)
TOTAL PROTEIN: 6.3 g/dL — AB (ref 6.5–8.1)

## 2015-09-16 LAB — CBC
HCT: 41.2 % (ref 39.0–52.0)
Hemoglobin: 13.4 g/dL (ref 13.0–17.0)
MCH: 29.8 pg (ref 26.0–34.0)
MCHC: 32.5 g/dL (ref 30.0–36.0)
MCV: 91.8 fL (ref 78.0–100.0)
Platelets: 184 10*3/uL (ref 150–400)
RBC: 4.49 MIL/uL (ref 4.22–5.81)
RDW: 13 % (ref 11.5–15.5)
WBC: 6.5 10*3/uL (ref 4.0–10.5)

## 2015-09-16 MED ORDER — METOPROLOL TARTRATE 25 MG/10 ML ORAL SUSPENSION
25.0000 mg | Freq: Two times a day (BID) | ORAL | Status: DC
  Administered 2015-09-16 – 2015-09-17 (×2): 25 mg
  Filled 2015-09-16 (×2): qty 10

## 2015-09-16 MED ORDER — LORAZEPAM 2 MG/ML PO CONC: 0.5000 mg | Freq: Two times a day (BID) | ORAL | Status: DC | PRN

## 2015-09-16 MED ORDER — AMOXICILLIN-POT CLAVULANATE 400-57 MG/5ML PO SUSR
875.0000 mg | Freq: Two times a day (BID) | ORAL | Status: DC
  Administered 2015-09-16 – 2015-09-17 (×2): 872 mg
  Filled 2015-09-16 (×3): qty 10.9

## 2015-09-16 NOTE — Care Management Important Message (Signed)
Important Message  Patient Details  Name: Nicholas Cobb MRN: [id number] Date of Birth: 1951/02/01   Medicare Important Message Given:  Yes    Loann Quill 09/16/2015, 10:47 AM

## 2015-09-16 NOTE — Progress Notes (Signed)
PROGRESS NOTE    CAVEN PERINE  OIN:[id number] DOB: 1950-06-22 DOA: 09/14/2015 PCP: [name], NP   Brief Narrative:  65 year old BM Depression , Throat cancer,Oosteoradionecrosis of the jaw S/P Tracheostomy, DM type II Emphysema( Pete. Babcock in the tracheostomy clinic). HTN, Cervical spondylolysis (04/30/2011); Cervical spondylosis (03/25/2011);   He was reportedly at home in his usual state of health when he suddenly developed dyspnea. In effort to improve his breathing he removed his tracheostomy. Dyspnea did not improve so he contacted EMS. Upon their arrival his oxygen saturations were in the low 80s on room air. He is placed on nonrebreather en route and sats improved to 98%. Upon arrival to the emergency department saturations noted to be 85%, however, he suffered an aspiration event which resulted in suspect hypoxemia, bradycardia, and eventually loss of pulses. CPR was initiated and an endotracheal tube was placed and his tracheostomy. Total downtime estimated about 90 seconds. After ROSC he was arousable but was not following commands. Tracheostomy secretions positive for frank blood and tube feeds. He was placed on mechanical ventilation and sedation. PCCM was contacted for admission.    Subjective:    Assessment & Plan:   Active Problems:   Acute respiratory failure with hypoxemia (HCC)   Cardiac arrest (HCC)   Tracheostomy malfunction (HCC)   Aspiration pneumonia of right lower lobe due to vomit ([place])   Throat cancer ([place])   Controlled diabetes mellitus type 2 with complications (HCC)   Anxiety state   Acute hypoxemic respiratory failure secondary to mucous plugging vs aspiration vs hemoptysis - chronic trach -Trach care per PCCM: 8/3 changed out tracheostomy  - appears to be stable at present  -schedule appointment for f/u w/ Marni Griffon, NP in the [facility] 1 week post discharge    Aspiration event -Complete 5 day course of antibiotics secondary  to patient's high risk: Will have patient complete 5th day on Augmentin.   Hemoptysis suspect due to airway trauma although some concern that bleeding preceded airway manipulation - has not recurred   Acute metabolic encephalopathy Appears to be steadily improving - due to acute severe illness/arrest - transfer out of SDU  ?Gram + rod bacteremia - 1 of 2 blood cx  Suspect contaminant - f/u speciation   Hx of throat cancer  Cardiac arrest ~90 seconds - primary respiratory - no further eval indicated at this time   HTN -BP poorly controlled, -Cozaar 100 mg daily -Metoprolol 25 mg BID  Hyponatremia Likely related to hyperglycemia as well as some volume depletion - address each of these issues and follow   DM type II controlled with complication -2/9 Hemoglobin A1c= 6.6 -CBG currently well controlled - follow  Anxiety/agitation -Ativan 0.5-1 mg BID  -Most likely secondary to being unable to easily communicate his thoughts, wants and needs except by writing on paper.  Assaulting staff -Counseled patient that there had been 2 complaints of him striking staff by 2 different RNs. Patient wrote out on paper that he was not trying to strike the staff, it was just that he can't talk and must write everything. -Explained to patient that hopefully this was a misunderstanding however if he was striking staff they would have the right to contacted [facility] PD and press charges.   DVT prophylaxis: SCD Code Status: Full Family Communication: None Disposition Plan: Discharge 8/4   Consultants:  [facility]  Procedures/Significant Events:  None  Cultures 8/1 blood left AC NGTD 8/1 blood left hand positive DIPHTHEROIDS(CORYNEBACTERIUM SPECIES),  most likely contaminant 8/1 MRSA by PCR negative 8/1 urine negative  Antimicrobials: Unasyn 8/1 > 8/3 Augmentin 8/3>>  Devices    LINES / TUBES:  PEG tube placement (11/1999)>> Tracheostomy (11/1999); >>    Continuous  Infusions: . sodium chloride 30 mL/hr at 09/15/15 2000  . feeding supplement (JEVITY 1.2 CAL) 65 mL/hr at 09/16/15 1544     Objective: Vitals:   09/16/15 1300 09/16/15 1400 09/16/15 1609 09/16/15 1658  BP: (!) 176/109 (!) 150/90 (!) 186/104 (!) 186/104  Pulse: 88 74 (!) 101 94  Resp: (!) _0 (!) 22  Temp:   97.6 F (36.4 C)   TempSrc:   Axillary   SpO2: 99% 96% 94% 95%  Weight:      Height:        Intake/Output Summary (Last 24 hours) at 09/16/15 1817 Last data filed at 09/16/15 1813  Gross per 24 hour  Intake             1935 ml  Output             2200 ml  Net             -265 ml   Filed Weights   09/14/15 0250 09/15/15 0500 09/16/15 0311  Weight: 75.3 kg (166 lb 0.1 oz) 79 kg (174 lb 2.6 oz) 76.4 kg (168 lb 6.4 oz)    Examination:  General: A/O 4, agitated, communicates well by writing questions/answers on paper, positive chronic  respiratory distress Eyes: negative scleral hemorrhage, negative anisocoria, negative icterus ENT: Negative Runny nose, negative gingival bleeding,  Neck:  Negative scars, masses, torticollis, lymphadenopathy, JVD,positive swelling right side of face/neck secondary to cancer, tracheostomy in place with dried blood/thick greenish sputum Lungs: Clear to auscultation bilaterally without wheezes or crackles Cardiovascular: Tachycardic, Regular rhythm without murmur gallop or rub normal S1 and S2 Abdomen: negative abdominal pain, nondistended, positive soft, bowel sounds, no rebound, no ascites, no appreciable mass, PEG tube in place negative sign of infection Extremities: No significant cyanosis, clubbing, or edema bilateral lower extremities Skin: Negative rashes, lesions, ulcers Psychiatric:  Negative depression, positive anxiety, positive agitation, negative mania  Central nervous system:  Cranial nerves II through XII intact, tongue/uvula midline, all extremities muscle strength 5/5, sensation intact throughout,  negative dysarthria,  negative expressive aphasia, negative receptive aphasia.  .     Data Reviewed: Care during the described time interval was provided by me .  I have reviewed this patient's available data, including medical history, events of note, physical examination, and all test results as part of my evaluation. I have personally reviewed and interpreted all radiology studies.  CBC:  Recent Labs Lab 09/14/15 0255 09/14/15 0308 09/15/15 0205 09/16/15 0327  WBC 11.5*  --  10.6* 6.5  NEUTROABS 7.4  --   --   --   HGB 14.8 16.7 14.0 13.4  HCT 45.5 49.0 41.8 41.2  MCV 92.5  --  92.3 91.8  PLT 231  --  193 683   Basic Metabolic Panel:  Recent Labs Lab 09/14/15 0255 09/14/15 0308 09/14/15 0440 09/15/15 0205 09/16/15 0327  NA 131* 133*  --  133* 133*  K 3.9 3.9  --  3.7 3.7  CL 96* 92*  --  99* 98*  CO2 24  --   --  27 29  GLUCOSE 252* 253*  --  128* 130*  BUN 16 21*  --  12 11  CREATININE 1.21 1.10  --  0.91 0.90  CALCIUM 9.0  --   --  9.0 9.1  MG  --   --  2.4 2.0  --   PHOS  --   --  5.8* 2.9  --    GFR: Estimated Creatinine Clearance: 74.5 mL/min (by C-G formula based on SCr of 0.9 mg/dL). Liver Function Tests:  Recent Labs Lab 09/16/15 0327  AST 79*  ALT 67*  ALKPHOS 101  BILITOT 0.4  PROT 6.3*  ALBUMIN 3.0*   No results for input(s): LIPASE, AMYLASE in the last 168 hours. No results for input(s): AMMONIA in the last 168 hours. Coagulation Profile:  Recent Labs Lab 09/14/15 0440  INR 1.00   Cardiac Enzymes:  Recent Labs Lab 09/14/15 0440 09/14/15 0929 09/14/15 1444  TROPONINI <0.03 0.05* 0.06*   BNP (last 3 results) No results for input(s): PROBNP in the last 8760 hours. HbA1C: No results for input(s): HGBA1C in the last 72 hours. CBG:  Recent Labs Lab 09/15/15 2355 09/16/15 0346 09/16/15 0759 09/16/15 1153 09/16/15 1611  GLUCAP 139* 128* 123* 98 90   Lipid Profile:  Recent Labs  09/14/15 0255  TRIG 119   Thyroid Function Tests: No  results for input(s): TSH, T4TOTAL, FREET4, T3FREE, THYROIDAB in the last 72 hours. Anemia Panel: No results for input(s): VITAMINB12, FOLATE, FERRITIN, TIBC, IRON, RETICCTPCT in the last 72 hours. Urine analysis:    Component Value Date/Time   COLORURINE YELLOW 09/14/2015 0910   APPEARANCEUR CLEAR 09/14/2015 0910   LABSPEC 1.013 09/14/2015 0910   PHURINE 7.5 09/14/2015 0910   GLUCOSEU NEGATIVE 09/14/2015 0910   GLUCOSEU NEGATIVE 10/29/2013 1118   HGBUR NEGATIVE 09/14/2015 0910   BILIRUBINUR NEGATIVE 09/14/2015 0910   BILIRUBINUR Negative 10/08/2014 0930   KETONESUR NEGATIVE 09/14/2015 0910   PROTEINUR NEGATIVE 09/14/2015 0910   UROBILINOGEN 0.2 10/08/2014 0930   UROBILINOGEN 0.2 10/29/2013 1118   NITRITE NEGATIVE 09/14/2015 0910   LEUKOCYTESUR NEGATIVE 09/14/2015 0910   Sepsis Labs: _0 (procalcitonin:4,lacticidven:4)  ) Recent Results (from the past 240 hour(s))  Blood culture (routine x 2)     Status: None (Preliminary result)   Collection Time: 09/14/15  2:50 AM  Result Value Ref Range Status   Specimen Description BLOOD LEFT ANTECUBITAL  Final   Special Requests BOTTLES DRAWN AEROBIC ONLY 5CC  Final   Culture NO GROWTH 2 DAYS  Final   Report Status PENDING  Incomplete  Blood culture (routine x 2)     Status: Abnormal (Preliminary result)   Collection Time: 09/14/15  2:55 AM  Result Value Ref Range Status   Specimen Description BLOOD LEFT HAND  Final   Special Requests BOTTLES DRAWN AEROBIC ONLY 5CC  Final   Culture  Setup Time   Final    GRAM POSITIVE RODS AEROBIC BOTTLE ONLY CRITICAL RESULT CALLED TO, READ BACK BY AND VERIFIED WITH: J LEDFORD PHARMD [id number] 09/15/15 A BROWNING    Culture (A)  Final    DIPHTHEROIDS(CORYNEBACTERIUM SPECIES) THE SIGNIFICANCE OF ISOLATING THIS ORGANISM FROM A SINGLE SET OF BLOOD CULTURES WHEN MULTIPLE SETS ARE DRAWN IS UNCERTAIN. PLEASE NOTIFY THE MICROBIOLOGY DEPARTMENT WITHIN ONE WEEK IF SPECIATION AND SENSITIVITIES ARE REQUIRED.      Report Status PENDING  Incomplete  Blood Culture ID Panel (Reflexed)     Status: None   Collection Time: 09/14/15  2:55 AM  Result Value Ref Range Status   Enterococcus species NOT DETECTED NOT DETECTED Final   Vancomycin resistance NOT DETECTED NOT DETECTED Final   Listeria monocytogenes NOT DETECTED NOT DETECTED Final  Staphylococcus species NOT DETECTED NOT DETECTED Final   Staphylococcus aureus NOT DETECTED NOT DETECTED Final   Methicillin resistance NOT DETECTED NOT DETECTED Final   Streptococcus species NOT DETECTED NOT DETECTED Final   Streptococcus agalactiae NOT DETECTED NOT DETECTED Final   Streptococcus pneumoniae NOT DETECTED NOT DETECTED Final   Streptococcus pyogenes NOT DETECTED NOT DETECTED Final   Acinetobacter baumannii NOT DETECTED NOT DETECTED Final   Enterobacteriaceae species NOT DETECTED NOT DETECTED Final   Enterobacter cloacae complex NOT DETECTED NOT DETECTED Final   Escherichia coli NOT DETECTED NOT DETECTED Final   Klebsiella oxytoca NOT DETECTED NOT DETECTED Final   Klebsiella pneumoniae NOT DETECTED NOT DETECTED Final   Proteus species NOT DETECTED NOT DETECTED Final   Serratia marcescens NOT DETECTED NOT DETECTED Final   Carbapenem resistance NOT DETECTED NOT DETECTED Final   Haemophilus influenzae NOT DETECTED NOT DETECTED Final   Neisseria meningitidis NOT DETECTED NOT DETECTED Final   Pseudomonas aeruginosa NOT DETECTED NOT DETECTED Final   Candida albicans NOT DETECTED NOT DETECTED Final   Candida glabrata NOT DETECTED NOT DETECTED Final   Candida krusei NOT DETECTED NOT DETECTED Final   Candida parapsilosis NOT DETECTED NOT DETECTED Final   Candida tropicalis NOT DETECTED NOT DETECTED Final  MRSA PCR Screening     Status: None   Collection Time: 09/14/15  5:50 AM  Result Value Ref Range Status   MRSA by PCR NEGATIVE NEGATIVE Final    Comment:        The GeneXpert MRSA Assay (FDA approved for NASAL specimens only), is one component of  a comprehensive MRSA colonization surveillance program. It is not intended to diagnose MRSA infection nor to guide or monitor treatment for MRSA infections.   Urine culture     Status: None   Collection Time: 09/14/15  9:10 AM  Result Value Ref Range Status   Specimen Description URINE, RANDOM  Final   Special Requests NONE  Final   Culture NO GROWTH  Final   Report Status 09/15/2015 FINAL  Final         Radiology Studies: Dg Chest Port 1 View  Result Date: 09/15/2015 CLINICAL DATA:  65 year old male with possible aspiration EXAM: PORTABLE CHEST 1 VIEW COMPARISON:  Chest radiograph dated 09/14/2015 FINDINGS: Tracheostomy with tip above the carina in stable positioning. Shallow inspiration with mild interstitial crowding. No focal consolidation, pleural effusion, or pneumothorax. The cardiac silhouette is within normal limits. No acute osseous pathology. IMPRESSION: No active disease. Electronically Signed   By: Anner Crete M.D.   On: 09/15/2015 04:54        Scheduled Meds: . amoxicillin-clavulanate  875 mg of amoxicillin Per Tube Q12H  . antiseptic oral rinse  7 mL Mouth Rinse QID  . chlorhexidine gluconate (SAGE KIT)  15 mL Mouth Rinse BID  . famotidine  20 mg Per Tube BID  . feeding supplement (PRO-STAT SUGAR FREE 64)  30 mL Per Tube Daily  . free water  200 mL Per Tube QID  . losartan  100 mg Per Tube Daily  . metoprolol tartrate  25 mg Per Tube BID  . naphazoline-pheniramine  2 drop Both Eyes BID  . tamsulosin  0.4 mg Oral QPC supper   Continuous Infusions: . sodium chloride 30 mL/hr at 09/15/15 2000  . feeding supplement (JEVITY 1.2 CAL) 65 mL/hr at 09/16/15 1544     LOS: 2 days    Time spent: 40 minutes    Ferrell Flam, Geraldo Docker, MD [facility] Pager  [phone]   If 7PM-7AM, please contact night-coverage [email or web address] Password [facility] 09/16/2015, 6:17 PM

## 2015-09-17 DIAGNOSIS — E118 Type 2 diabetes mellitus with unspecified complications: Secondary | ICD-10-CM

## 2015-09-17 DIAGNOSIS — I469 Cardiac arrest, cause unspecified: Secondary | ICD-10-CM

## 2015-09-17 DIAGNOSIS — C140 Malignant neoplasm of pharynx, unspecified: Secondary | ICD-10-CM

## 2015-09-17 DIAGNOSIS — J690 Pneumonitis due to inhalation of food and vomit: Secondary | ICD-10-CM

## 2015-09-17 DIAGNOSIS — J9503 Malfunction of tracheostomy stoma: Principal | ICD-10-CM

## 2015-09-17 LAB — GLUCOSE, CAPILLARY
GLUCOSE-CAPILLARY: 127 mg/dL — AB (ref 65–99)
GLUCOSE-CAPILLARY: 113 mg/dL — AB (ref 65–99)
GLUCOSE-CAPILLARY: 109 mg/dL — AB (ref 65–99)
Glucose-Capillary: 126 mg/dL — ABNORMAL HIGH (ref 65–99)

## 2015-09-17 LAB — CULTURE, BLOOD (ROUTINE X 2)

## 2015-09-17 NOTE — Procedures (Signed)
6 cuffed xlt proximal changed to 6 uncuffed proximal xlt. Pt tolerated well.  I will see him in trach clinic in 8 weeks  [name] ACNP-BC [place] Pager # [phone] OR # [phone] if no answer

## 2015-09-17 NOTE — Significant Event (Signed)
Reviewed AVS with patient. Patient wrote that he is in understanding of his appointments, medications, signs/symptoms to call medical provider.   Per patient's request, staff made follow-up appointment (with Barbaraann Barthel, on 10/04/2015 at 1200pm-patient wanted 3rd Monday of the month). Patient given a copy of the AVS. All belongings (cellphone, clothes, and sunglasses) are with patient. Awaiting ambulance for transportation.Marland Kitchen

## 2015-09-17 NOTE — Significant Event (Signed)
Patient discharge to home-taken via ambulance. Vital signs stable prior to discharge. Family (daughter) made aware of the discharge. Patient left unit at 1511pm.

## 2015-09-17 NOTE — Discharge Summary (Signed)
DISCHARGE SUMMARY  Nicholas Cobb  MR#: [id number]  DOB:[birth year]  Date of Admission: 09/14/2015 Date of Discharge: 09/17/2015  Attending Physician:Fidencia Mccloud T  Patient's IPJ:[id number], Nicholas K, NP  Consults:  PCCM  Disposition: D/C home   Follow-up Appts: Follow-up Information    Clementeen Graham, NP. Schedule an appointment as soon as possible for a visit in 1 week(s).   Specialty:  Nurse Practitioner Why:  schedule appointment for f/u w/ Nicholas Griffon, NP in the [facility] 1 week post discharge   Contact information: [place] [address] [phone]        Sherrie Mustache K, NP Follow up in 1 week(s).   Specialty:  Geriatric Medicine Contact information: [place] [place] [address] [phone]           Tests Needing Follow-up: -recheck of BP to determine if med titration indicated   Discharge Diagnoses: Acute hypoxemic respiratory failure secondary to mucous plugging vs aspiration vs hemoptysis - chronic trach Aspiration event Hemoptysis Acute metabolic encephalopathy ?Diptheroid bacteremia - 1 of 2 blood cx - c/w contaminant  Hx of throat cancer Cardiac arrest HTN Hyponatremia DM  Initial presentation: 65 year old male with history of throat cancer, osteoradionecrosis of the jaw status post tracheostomy, diabetes mellitus, and emphysema who is followed by [name] in the tracheostomy clinic. He was reportedly at home in his usual state of health when he suddenly developed dyspnea. In an effort to improve his breathing he removed his tracheostomy. Dyspnea did not improve so he contacted EMS. Upon their arrival his oxygen saturations were in the low 80s on room air. He was placed on nonrebreather en route and sats improved to 98%. Upon arrival to the emergency department saturations noted to be 85%, however, he suffered an aspiration event which resulted in hypoxemia, bradycardia, and eventually loss of pulses.  CPR was initiated and an endotracheal tube was placed in his tracheostomy. Total downtime estimated about 90 seconds. After ROSC he was arousable but was not following commands. Tracheostomy secretions positive for frank blood and tube feeds. He was placed on mechanical ventilation and sedation. PCCM was contacted for admission.  Hospital Course:   Acute hypoxemic respiratory failure secondary to mucous plugging vs aspiration vs hemoptysis - chronic trach Trach care per PCCM - appears to be stable at time of d/c - to cont to f/u w/ Nicholas Griffon, NP in the [facility]    Aspiration event completed 5 days of empiric abx tx - no evidence of ongoing infection at time of d/c   Hemoptysis suspect due to airway trauma although some concern that bleeding preceded airway manipulation - has not recurred during hospitalization   Acute metabolic encephalopathy due to acute severe illness/arrest - resolved prior to d/c home   ?Diptheroid bacteremia - 1 of 2 blood cx  Most c/w contaminant - no further tx required   Hx of throat cancer  Cardiac arrest ~90 seconds - primary respiratory - no further eval indicated at this time   HTN Pt was frequently agitated and unhappy during hospital stay, therefore BP meds not changed for outpt setting - will need f/u w/ his PCP   Hyponatremia Likely related to hyperglycemia as well as some volume depletion - address each of these issues and follow   DM CBG well controlled during hospital stay      Medication List    STOP taking these medications   hydrOXYzine 25 MG capsule Commonly known as:  VISTARIL     TAKE these medications   12 HOUR NASAL SPRAY 0.05 % nasal spray Generic drug:  oxymetazoline Place 2 sprays into the nose 2 (two) times daily as needed for congestion.   AMBULATORY NON FORMULARY MEDICATION Trach(Shiley 6.0 XLT UP uncuffed), Trach Care Kit, Trach holders, Disposable inner cannulas   feeding supplement (JEVITY 1.2  CAL) Liqd Place 237 mLs into feeding tube See admin instructions. Pt takes 2 cans at breakfast, 2 cans at lunch, 2 cans at dinner and 2 at night   ibuprofen 400 MG tablet Commonly known as:  ADVIL,MOTRIN Take one tablet by mouth three times daily as needed for pain   losartan 100 MG tablet Commonly known as:  COZAAR take 1 tablet by mouth once daily   naphazoline-pheniramine 0.025-0.3 % ophthalmic solution Commonly known as:  NAPHCON-A Place 2 drops into both eyes 4 (four) times daily as needed for irritation (red eye).   ranitidine 150 MG tablet Commonly known as:  ZANTAC Take 1 tablet (150 mg total) by mouth 2 (two) times daily.   tamsulosin 0.4 MG Caps capsule Commonly known as:  FLOMAX Take 1 capsule (0.4 mg total) by mouth daily.   triamcinolone cream 0.1 % Commonly known as:  KENALOG Apply 1 application topically 2 (two) times daily.       Day of Discharge BP (!) 152/91   Pulse 65   Temp 98.9 F (37.2 C) (Axillary)   Resp (!) 25   Ht 5' 7" (1.702 m)   Wt 76.7 kg (169 lb 3.2 oz)   SpO2 95%   BMI 26.50 kg/m   Physical Exam: General: No acute respiratory distress Lungs: Clear to auscultation bilaterally without wheezes or crackles Cardiovascular: Regular rate and rhythm without murmur gallop or rub normal S1 and S2 Abdomen: Nontender, nondistended, soft, bowel sounds positive, no rebound, no ascites, no appreciable mass Extremities: No significant cyanosis, clubbing, or edema bilateral lower extremities  Basic Metabolic Panel:  Recent Labs Lab 09/14/15 0255 09/14/15 0308 09/14/15 0440 09/15/15 0205 09/16/15 0327  NA 131* 133*  --  133* 133*  Cobb 3.9 3.9  --  3.7 3.7  CL 96* 92*  --  99* 98*  CO2 24  --   --  27 29  GLUCOSE 252* 253*  --  128* 130*  BUN 16 21*  --  12 11  CREATININE 1.21 1.10  --  0.91 0.90  CALCIUM 9.0  --   --  9.0 9.1  MG  --   --  2.4 2.0  --   PHOS  --   --  5.8* 2.9  --     Liver Function Tests:  Recent Labs Lab  09/16/15 0327  AST 79*  ALT 67*  ALKPHOS 101  BILITOT 0.4  PROT 6.3*  ALBUMIN 3.0*   Coags:  Recent Labs Lab 09/14/15 0440  INR 1.00   CBC:  Recent Labs Lab 09/14/15 0255 09/14/15 0308 09/15/15 0205 09/16/15 0327  WBC 11.5*  --  10.6* 6.5  NEUTROABS 7.4  --   --   --   HGB 14.8 16.7 14.0 13.4  HCT 45.5 49.0 41.8 41.2  MCV 92.5  --  92.3 91.8  PLT 231  --  193 184    Cardiac Enzymes:  Recent Labs Lab 09/14/15 0440 09/14/15 0929 09/14/15 1444  TROPONINI <0.03 0.05* 0.06*    CBG:  Recent Labs Lab 09/16/15 1958 09/16/15 2325 09/17/15 0320 09/17/15 0752 09/17/15 1200  GLUCAP 113*  127* 113* 126* 109*    Recent Results (from the past 240 hour(s))  Blood culture (routine x 2)     Status: None (Preliminary result)   Collection Time: 09/14/15  2:50 AM  Result Value Ref Range Status   Specimen Description BLOOD LEFT ANTECUBITAL  Final   Special Requests BOTTLES DRAWN AEROBIC ONLY 5CC  Final   Culture NO GROWTH 3 DAYS  Final   Report Status PENDING  Incomplete  Blood culture (routine x 2)     Status: Abnormal   Collection Time: 09/14/15  2:55 AM  Result Value Ref Range Status   Specimen Description BLOOD LEFT HAND  Final   Special Requests BOTTLES DRAWN AEROBIC ONLY 5CC  Final   Culture  Setup Time   Final    GRAM POSITIVE RODS AEROBIC BOTTLE ONLY CRITICAL RESULT CALLED TO, READ BACK BY AND VERIFIED WITHLenna Sciara [facility] PHARMD [id number] 09/15/15 A BROWNING    Culture (A)  Final    DIPHTHEROIDS(CORYNEBACTERIUM SPECIES) Standardized susceptibility testing for this organism is not available.    Report Status 09/17/2015 FINAL  Final  Blood Culture ID Panel (Reflexed)     Status: None   Collection Time: 09/14/15  2:55 AM  Result Value Ref Range Status   Enterococcus species NOT DETECTED NOT DETECTED Final   Vancomycin resistance NOT DETECTED NOT DETECTED Final   Listeria monocytogenes NOT DETECTED NOT DETECTED Final   Staphylococcus species NOT DETECTED NOT  DETECTED Final   Staphylococcus aureus NOT DETECTED NOT DETECTED Final   Methicillin resistance NOT DETECTED NOT DETECTED Final   Streptococcus species NOT DETECTED NOT DETECTED Final   Streptococcus agalactiae NOT DETECTED NOT DETECTED Final   Streptococcus pneumoniae NOT DETECTED NOT DETECTED Final   Streptococcus pyogenes NOT DETECTED NOT DETECTED Final   Acinetobacter baumannii NOT DETECTED NOT DETECTED Final   Enterobacteriaceae species NOT DETECTED NOT DETECTED Final   Enterobacter cloacae complex NOT DETECTED NOT DETECTED Final   Escherichia coli NOT DETECTED NOT DETECTED Final   Klebsiella oxytoca NOT DETECTED NOT DETECTED Final   Klebsiella pneumoniae NOT DETECTED NOT DETECTED Final   Proteus species NOT DETECTED NOT DETECTED Final   Serratia marcescens NOT DETECTED NOT DETECTED Final   Carbapenem resistance NOT DETECTED NOT DETECTED Final   Haemophilus influenzae NOT DETECTED NOT DETECTED Final   Neisseria meningitidis NOT DETECTED NOT DETECTED Final   Pseudomonas aeruginosa NOT DETECTED NOT DETECTED Final   Candida albicans NOT DETECTED NOT DETECTED Final   Candida glabrata NOT DETECTED NOT DETECTED Final   Candida krusei NOT DETECTED NOT DETECTED Final   Candida parapsilosis NOT DETECTED NOT DETECTED Final   Candida tropicalis NOT DETECTED NOT DETECTED Final  MRSA PCR Screening     Status: None   Collection Time: 09/14/15  5:50 AM  Result Value Ref Range Status   MRSA by PCR NEGATIVE NEGATIVE Final    Comment:        The GeneXpert MRSA Assay (FDA approved for NASAL specimens only), is one component of a comprehensive MRSA colonization surveillance program. It is not intended to diagnose MRSA infection nor to guide or monitor treatment for MRSA infections.   Urine culture     Status: None   Collection Time: 09/14/15  9:10 AM  Result Value Ref Range Status   Specimen Description URINE, RANDOM  Final   Special Requests NONE  Final   Culture NO GROWTH  Final    Report Status 09/15/2015 FINAL  Final     Time  spent in discharge (includes decision making & examination of pt): >30 minutes  09/17/2015, 12:38 PM   Cherene Altes, MD [facility] Office  [phone] Pager [phone]  On-Call/Text Page:      [email or web address]      password [facility]

## 2015-09-17 NOTE — Progress Notes (Addendum)
Spoke w pt. He communicates by writing. He states lives alone and takes care of trach and peg. He gets all eq from [facility]. He has suction and oxygen and gets tube feeds from them. His sister checks on him. Has on in [place]. His da has family and lives in [place]. Da can be contact person. He is agreeable to few visits by hhrn. [facility] would [facility] to use [facility] Ref made to [facility]. Will need hhrn order and face to face at disch. Pt states tries to be very indep. Alerted mary w [facility] that dc order written for today and hhc orders written

## 2015-09-17 NOTE — Discharge Instructions (Signed)
Allergies An allergy is when your body reacts to a substance in a way that is not normal. An allergic reaction can happen after you:  Eat something.  Breathe in something.  Touch something. WHAT KINDS OF ALLERGIES ARE THERE? You can be allergic to:  Things that are only around during certain seasons, like molds and pollens.  Foods.  Drugs.  Insects.  Animal dander. WHAT ARE SYMPTOMS OF ALLERGIES?  Puffiness (swelling). This may happen on the lips, face, tongue, mouth, or throat.  Sneezing.  Coughing.  Breathing loudly (wheezing).  Stuffy nose.  Tingling in the mouth.  A rash.  Itching.  Itchy, red, puffy areas of skin (hives).  Watery eyes.  Throwing up (vomiting).  Watery poop (diarrhea).  Dizziness.  Feeling faint or fainting.  Trouble breathing or swallowing.  A tight feeling in the chest.  A fast heartbeat. HOW ARE ALLERGIES DIAGNOSED? Allergies can be diagnosed with:  A medical and family history.  Skin tests.  Blood tests.  A food diary. A food diary is a record of all the foods, drinks, and symptoms you have each day.  The results of an elimination diet. This diet involves making sure not to eat certain foods and then seeing what happens when you start eating them again. HOW ARE ALLERGIES TREATED? There is no cure for allergies, but allergic reactions can be treated with medicine. Severe reactions usually need to be treated at a hospital.  HOW CAN REACTIONS BE PREVENTED? The best way to prevent an allergic reaction is to avoid the thing you are allergic to. Allergy shots and medicines can also help prevent reactions in some cases.   This information is not intended to replace advice given to you by your health care provider. Make sure you discuss any questions you have with your health care provider.   Document Released: 05/27/2012 Document Revised: 02/20/2014 Document Reviewed: 11/11/2013 Elsevier Interactive Patient Education  2016 [employer]. Respiratory failure is when your lungs are not working well and your breathing (respiratory) system fails. When respiratory failure occurs, it is difficult for your lungs to get enough oxygen, get rid of carbon dioxide, or both. Respiratory failure can be life threatening.  Respiratory failure can be acute or chronic. Acute respiratory failure is sudden, severe, and requires emergency medical treatment. Chronic respiratory failure is less severe, happens over time, and requires ongoing treatment.  WHAT ARE THE CAUSES OF ACUTE RESPIRATORY FAILURE?  Any problem affecting the heart or lungs can cause acute respiratory failure. Some of these causes include the following:  Chronic bronchitis and emphysema (COPD).   Blood clot going to a lung (pulmonary embolism).   Having water in the lungs caused by heart failure, lung injury, or infection (pulmonary edema).   Collapsed lung (pneumothorax).   Pneumonia.   Pulmonary fibrosis.   Obesity.   Asthma.   Heart failure.   Any type of trauma to the chest that can make breathing difficult.   Nerve or muscle diseases making chest movements difficult. HOW WILL MY ACUTE RESPIRATORY FAILURE BE TREATED?  Treatment of acute respiratory failure depends on the cause of the respiratory failure. Usually, you will stay in the intensive care unit so your breathing can be watched closely. Treatment can include the following:  Oxygen. Oxygen can be delivered through the following:  Nasal cannula. This is small tubing that goes in your nose to give you oxygen.  Face mask. A face mask covers your nose and mouth to give you oxygen.  Medicine. Different medicines can be given to help with breathing. These can include:  Nebulizers. Nebulizers deliver medicines to open the air passages (bronchodilators). These medicines help to open or relax the airways in the lungs so you can breathe better. They can also help loosen mucus from  your lungs.  Diuretics. Diuretic medicines can help you breathe better by getting rid of extra water in your body.  Steroids. Steroid medicines can help decrease swelling (inflammation) in your lungs.  Antibiotics.  Chest tube. If you have a collapsed lung (pneumothorax), a chest tube is placed to help reinflate the lung.  Noninvasive positive pressure ventilation (NPPV). This is a tight-fitting mask that goes over your nose and mouth. The mask has tubing that is attached to a machine. The machine blows air into the tubing, which helps to keep the tiny air sacs (alveoli) in your lungs open. This machine allows you to breathe on your own.  Ventilator. A ventilator is a breathing machine. When on a ventilator, a breathing tube is put into the lungs. A ventilator is used when you can no longer breathe well enough on your own. You may have low oxygen levels or high carbon dioxide (CO2) levels in your blood. When you are on a ventilator, sedation and pain medicines are given to make you sleep so your lungs can heal. SEEK IMMEDIATE MEDICAL CARE IF:  You have shortness of breath (dyspnea) with or without activity.  You have rapid breathing (tachypnea).  You are wheezing.  You are unable to say more than a few words without having to catch your breath.  You find it very difficult to function normally.  You have a fast heart rate.  You have a bluish color to your finger or toe nail beds.  You have confusion or drowsiness or both.   This information is not intended to replace advice given to you by your health care provider. Make sure you discuss any questions you have with your health care provider.   Document Released: 02/04/2013 Document Revised: 10/21/2014 Document Reviewed: 02/04/2013 Elsevier Interactive Patient Education [employer].

## 2015-09-17 NOTE — Significant Event (Signed)
1400-All lines removed from patient, PIVs removed, EKG discontinued. Ambulance is here to pick up patient.   Patient started mouthing and wrote that he does not want the cuffed trach. This RN told him that staff will need to call medical team-patient became upset and slammed his hands on the table. He wrote that he will be filling a malpractice suit against the staff and hospital for this and that he does not want the cuffed trach. RN called and spoke with Dr. Thereasa Solo regarding changing trach to cuffless. MD advised RN to call pulmonary team. RN encouraged patient to remain in the room and will work on getting the trach changed. Patient eventually agreed and remain calmed.   Pete with pulmonary called back-stated he will be in the unit to change the trach to shiley 6 proximal cuffless.

## 2015-09-17 NOTE — Progress Notes (Signed)
No obturator present on 04:00 assessment, respiratory paged for replacement

## 2015-09-19 DIAGNOSIS — J439 Emphysema, unspecified: Secondary | ICD-10-CM | POA: Diagnosis not present

## 2015-09-19 DIAGNOSIS — Z430 Encounter for attention to tracheostomy: Secondary | ICD-10-CM | POA: Diagnosis not present

## 2015-09-19 DIAGNOSIS — Z431 Encounter for attention to gastrostomy: Secondary | ICD-10-CM | POA: Diagnosis not present

## 2015-09-19 DIAGNOSIS — M4302 Spondylolysis, cervical region: Secondary | ICD-10-CM | POA: Diagnosis not present

## 2015-09-19 DIAGNOSIS — E119 Type 2 diabetes mellitus without complications: Secondary | ICD-10-CM | POA: Diagnosis not present

## 2015-09-19 DIAGNOSIS — I10 Essential (primary) hypertension: Secondary | ICD-10-CM | POA: Diagnosis not present

## 2015-09-19 LAB — CULTURE, BLOOD (ROUTINE X 2): CULTURE: NO GROWTH

## 2015-10-04 ENCOUNTER — Inpatient Hospital Stay: Payer: Medicare Other | Admitting: Acute Care

## 2015-10-06 ENCOUNTER — Ambulatory Visit (HOSPITAL_COMMUNITY): Payer: Medicare Other

## 2015-10-20 ENCOUNTER — Ambulatory Visit (INDEPENDENT_AMBULATORY_CARE_PROVIDER_SITE_OTHER): Payer: Medicare Other | Admitting: Internal Medicine

## 2015-10-20 ENCOUNTER — Encounter: Payer: Self-pay | Admitting: Internal Medicine

## 2015-10-20 VITALS — BP 130/78 | HR 80 | Temp 98.9°F | Ht 69.0 in | Wt 168.2 lb

## 2015-10-20 DIAGNOSIS — L309 Dermatitis, unspecified: Secondary | ICD-10-CM

## 2015-10-20 DIAGNOSIS — Y842 Radiological procedure and radiotherapy as the cause of abnormal reaction of the patient, or of later complication, without mention of misadventure at the time of the procedure: Principal | ICD-10-CM

## 2015-10-20 DIAGNOSIS — C329 Malignant neoplasm of larynx, unspecified: Secondary | ICD-10-CM | POA: Diagnosis not present

## 2015-10-20 DIAGNOSIS — M272 Inflammatory conditions of jaws: Secondary | ICD-10-CM | POA: Diagnosis not present

## 2015-10-20 DIAGNOSIS — Z23 Encounter for immunization: Secondary | ICD-10-CM

## 2015-10-20 DIAGNOSIS — I10 Essential (primary) hypertension: Secondary | ICD-10-CM

## 2015-10-20 DIAGNOSIS — E871 Hypo-osmolality and hyponatremia: Secondary | ICD-10-CM

## 2015-10-20 DIAGNOSIS — I890 Lymphedema, not elsewhere classified: Secondary | ICD-10-CM | POA: Diagnosis not present

## 2015-10-20 DIAGNOSIS — R739 Hyperglycemia, unspecified: Secondary | ICD-10-CM

## 2015-10-20 DIAGNOSIS — Z930 Tracheostomy status: Secondary | ICD-10-CM

## 2015-10-20 DIAGNOSIS — Z931 Gastrostomy status: Secondary | ICD-10-CM | POA: Diagnosis not present

## 2015-10-20 LAB — COMPLETE METABOLIC PANEL WITH GFR
ALBUMIN: 4.2 g/dL (ref 3.6–5.1)
ALK PHOS: 112 U/L (ref 40–115)
ALT: 19 U/L (ref 9–46)
AST: 27 U/L (ref 10–35)
BILIRUBIN TOTAL: 0.4 mg/dL (ref 0.2–1.2)
BUN: 17 mg/dL (ref 7–25)
CALCIUM: 9.5 mg/dL (ref 8.6–10.3)
CO2: 29 mmol/L (ref 20–31)
Chloride: 92 mmol/L — ABNORMAL LOW (ref 98–110)
Creat: 1 mg/dL (ref 0.70–1.25)
GFR, EST NON AFRICAN AMERICAN: 78 mL/min (ref >=60)
Glucose, Bld: 90 mg/dL (ref 65–99)
Potassium: 4.4 mmol/L (ref 3.5–5.3)
Sodium: 131 mmol/L — ABNORMAL LOW (ref 135–146)
TOTAL PROTEIN: 7.5 g/dL (ref 6.1–8.1)

## 2015-10-20 MED ORDER — BETAMETHASONE DIPROPIONATE 0.05 % EX LOTN: TOPICAL_LOTION | Freq: Two times a day (BID) | CUTANEOUS | 2 refills | Status: AC

## 2015-10-20 MED ORDER — ZOSTER VACCINE LIVE 19400 UNT/0.65ML ~~LOC~~ SUSR: 0.6500 mL | Freq: Once | SUBCUTANEOUS | 0 refills | Status: AC

## 2015-10-20 NOTE — Patient Instructions (Addendum)
Pneumovax injection given today  Will call with referral to ENT at [facility] current medications as ordered  Follow up with specialists as scheduled  Have CAP program contact PCP office to discuss appropriate referral needs for patient  Follow up in 2 mos with Sherrie Mustache, NP for routine visit

## 2015-10-20 NOTE — Progress Notes (Signed)
Patient ID: Nicholas Cobb, male   DOB: 02/17/1950, 65 y.o.   MRN: [id number]    Location:  [facility] Place of Service: OFFICE  Chief Complaint  Patient presents with  . Referral    wants referral to different ENT, here with daughter Nicholas Cobb  . Other    discuss pneu. 23 vacc.    HPI:  65 yo male seen today to discuss ENT referral. He is unhappy with tx offered by Dr Redmond Baseman. His daughter, Nicholas Cobb, is present. She scheduled him an appt in [place] next month. Pt has appt with [facility] (Dr Feliz Beam) in [facility] on Oct 19th. He needs referral sent to that office.  He was admitted to hospital last month and went into cardiac arrest and had to receive CPR. He was revived after 90 sec. He was placed on vent but was subsequently weaned. No further issues with breathing. He has appt with the trach clinic at the end of October. Lurline Idol was replaced during hospital admission. Na 133 at d/c. BP was elevated during admission. He has [place] that checks BP at home and it has been stable.   He has dry skin on LLE and requests a lotion which he used in childhood. He does not recall the name of it.  No other concerns. He is getting adequate nutrition via TF.  He is a poor historian due to nonverbal. Hx obtained from chart, handwritten messages from pt and daughter   He continues to be dependent on others for transportation and care. He uses TF and trach supplies. [place] agency is [facility]. He would like to change to [facility] if possible  Past Medical History:  Diagnosis Date  . Allergy   . Cancer ([place]) 1999   throat cancer  . Cervical spondylolysis 04/30/2011   severe  . Cervical spondylosis 03/25/2011  . Change in voice   . Chronic pain 03/25/2011  . Chronic sinusitis 04/27/2011  . Depression   . Difficulty urinating   . Emphysema 03/25/2011  . Essential hypertension, benign 12/03/2012  . GERD (gastroesophageal reflux disease) 03/25/2011  . Hearing loss   . Impaired glucose tolerance 04/27/2011  .  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 Rash   . Trouble swallowing   . Type II or unspecified type diabetes mellitus without mention of complication, uncontrolled 04/27/2011  . Ulcer   . UTI (lower urinary tract infection)   . Xerostomia 03/25/2011    Past Surgical History:  Procedure Laterality Date  . GASTROSTOMY TUBE PLACEMENT    . PEG TUBE PLACEMENT  11/1999  . TRACHEOSTOMY  11/1999    Patient Care Team: [name], NP as PCP - General (Nurse Practitioner)  Social History   Social History  . Marital status: Single    Spouse name: N/A  . Number of children: N/A  . Years of education: 40   Occupational History  . retired Disabled   Social History Main Topics  . Smoking status: Former Smoker    Years: 23.00    Quit date: 11/06/1996  . Smokeless tobacco: Never Used  . Alcohol use No  . Drug use: No  . Sexual activity: Not on file   Other Topics Concern  . Not on file   Social History Narrative   Pt. Lives in a apartment, 1 story, 2 people live in the home, No pet's. Pt. Does exercise (walk) and yes pt. Has a living will.     reports that he quit smoking about 18  years ago. He quit after 23.00 years of use. He has never used smokeless tobacco. He reports that he does not drink alcohol or use drugs.  Family History  Problem Relation Age of Onset  . Diabetes Mother   . Heart disease Father   . Stroke Other   . Diabetes Other   . Cancer Other     lung cancer   Family Status  Relation Status  . Mother Deceased at age 38  . Father Deceased at age 51  . Sister Deceased  . Sister Deceased  . Other   . Other      Allergies  Allergen Reactions  . Oxybutynin Chloride Nausea And Vomiting  . Codeine Nausea And Vomiting and Rash    Medications: Patient's Medications  New Prescriptions   No medications on file  Previous Medications   AMBULATORY NON FORMULARY MEDICATION    Trach(Shiley 6.0 XLT UP uncuffed), Trach Care Kit, [place] holders, Disposable  inner cannulas   IBUPROFEN (ADVIL,MOTRIN) 400 MG TABLET    Take one tablet by mouth three times daily as needed for pain   LOSARTAN (COZAAR) 100 MG TABLET    take 1 tablet by mouth once daily   NAPHAZOLINE-PHENIRAMINE (NAPHCON-A) 0.025-0.3 % OPHTHALMIC SOLUTION    Place 2 drops into both eyes 4 (four) times daily as needed for irritation (red eye).   NUTRITIONAL SUPPLEMENTS (FEEDING SUPPLEMENT, JEVITY 1.2 CAL,) LIQD    Place 237 mLs into feeding tube See admin instructions. Pt takes 2 cans at breakfast, 2 cans at lunch, 2 cans at dinner and 2 at night   OXYMETAZOLINE (12 HOUR NASAL SPRAY) 0.05 % NASAL SPRAY    Place 2 sprays into the nose 2 (two) times daily as needed for congestion.    RANITIDINE (ZANTAC) 150 MG TABLET    Take 1 tablet (150 mg total) by mouth 2 (two) times daily.   TAMSULOSIN (FLOMAX) 0.4 MG CAPS CAPSULE    Take 1 capsule (0.4 mg total) by mouth daily.   TRIAMCINOLONE CREAM (KENALOG) 0.1 %    Apply 1 application topically 2 (two) times daily.  Modified Medications   Modified Medication Previous Medication   ZOSTER VACCINE LIVE, PF, (ZOSTAVAX) 53664 UNT/0.65ML INJECTION Zoster Vaccine Live, PF, (ZOSTAVAX) 40347 UNT/0.65ML injection      Inject 19,400 Units into the skin once.    Inject 0.65 mLs into the skin once.  Discontinued Medications   No medications on file    Review of Systems  Unable to perform ROS: Patient nonverbal    Vitals:   10/20/15 0826  BP: 130/78  Pulse: 80  Temp: 98.9 F (37.2 C)  SpO2: 93%  Weight: 168 lb 3.2 oz (76.3 kg)  Height: '5\' 9"'$  (1.753 m)   Body mass index is 24.84 kg/m.  Physical Exam  Constitutional: He appears well-developed.  Frail appearing in NAD  HENT:  Head:    Unable to open mouth  Eyes: Pupils are equal, round, and reactive to light. No scleral icterus.  Neck: Neck supple. Carotid bruit is not present. No thyromegaly present.  Trach present. No d/c or redness.  Cardiovascular: Regular rhythm and intact distal pulses.   Tachycardia present.  Exam reveals no gallop and no friction rub.   Murmur (1/6 SEM) heard. Trace LE edema b/l. No calf TTP  Pulmonary/Chest: Effort normal and breath sounds normal. No stridor. He has no wheezes. He has no rales. He exhibits no tenderness.  Abdominal: Soft. Bowel sounds are normal. He exhibits no  distension, no abdominal bruit, no pulsatile midline mass and no mass. There is no tenderness. There is no rebound and no guarding.    Musculoskeletal: He exhibits edema and tenderness.  Lymphadenopathy:    He has no cervical adenopathy.  Neurological: He is alert.  Skin: Skin is warm and dry. No rash noted.  Psychiatric: His mood appears anxious. His affect is not angry. He is agitated. He is noncommunicative.     Labs reviewed: Admission on 09/14/2015, Discharged on 09/17/2015  No results displayed because visit has over 200 results.  CBC Latest Ref Rng & Units 09/16/2015 09/15/2015 09/14/2015  WBC 4.0 - 10.5 K/uL 6.5 10.6(H) -  Hemoglobin 13.0 - 17.0 g/dL 13.4 14.0 16.7  Hematocrit 39.0 - 52.0 % 41.2 41.8 49.0  Platelets 150 - 400 K/uL 184 193 -   CMP Latest Ref Rng & Units 09/16/2015 09/15/2015 09/14/2015  Glucose 65 - 99 mg/dL 130(H) 128(H) 253(H)  BUN 6 - 20 mg/dL 11 12 21(H)  Creatinine 0.61 - 1.24 mg/dL 0.90 0.91 1.10  Sodium 135 - 145 mmol/L 133(L) 133(L) 133(L)  Potassium 3.5 - 5.1 mmol/L 3.7 3.7 3.9  Chloride 101 - 111 mmol/L 98(L) 99(L) 92(L)  CO2 22 - 32 mmol/L 29 27 -  Calcium 8.9 - 10.3 mg/dL 9.1 9.0 -  Total Protein 6.5 - 8.1 g/dL 6.3(L) - -  Total Bilirubin 0.3 - 1.2 mg/dL 0.4 - -  Alkaline Phos 38 - 126 U/L 101 - -  AST 15 - 41 U/L 79(H) - -  ALT 17 - 63 U/L 67(H) - -       No results found.   Assessment/Plan   ICD-9-CM ICD-10-CM   1. Osteoradionecrosis of jaw 526.89 M27.2 Ambulatory referral to ENT   E926.9    2. Dermatitis 692.9 L30.9 betamethasone dipropionate 0.05 % lotion  3. Tracheostomy in place [facility]) V44.0 Z93.0 Ambulatory referral to ENT  4.  Lymphedema of face 457.1 I89.0 Ambulatory referral to ENT  5. Essential hypertension, benign 401.1 I10   6. S/P percutaneous endoscopic gastrostomy (PEG) tube placement (HCC) V44.1 Z93.1   7. Hyponatremia 276.1 E87.1 CMP with eGFR  8. Hyperglycemia 790.29 R73.9 CMP with eGFR     Hemoglobin A1c  9. Need for 23-polyvalent pneumococcal polysaccharide vaccine V03.82 Z23 Pneumococcal polysaccharide vaccine 23-valent greater than or equal to 2yo subcutaneous/IM  10. Laryngeal cancer ([place]) 161.9 C32.9 Ambulatory referral to ENT    Pneumovax injection given today  Will call with referral to ENT at [facility] current medications as ordered  Follow up with specialists as scheduled  Have CAP program contact PCP office to discuss appropriate referral needs for patient  Follow up in 2 mos with Sherrie Mustache, NP for routine visit   Genecis Veley S. Perlie Gold  [facility] and Adult Medicine [address] [place], [place] [address] [phone] Cell (Monday-Friday 8 AM - 5 PM) [phone] After 5 PM and follow prompts

## 2015-10-21 LAB — HEMOGLOBIN A1C
HEMOGLOBIN A1C: 6.5 % — AB (ref <5.7)
MEAN PLASMA GLUCOSE: 140 mg/dL

## 2015-11-14 DIAGNOSIS — C153 Malignant neoplasm of upper third of esophagus: Secondary | ICD-10-CM | POA: Diagnosis not present

## 2015-11-26 ENCOUNTER — Other Ambulatory Visit: Payer: Self-pay | Admitting: Nurse Practitioner

## 2015-12-06 DIAGNOSIS — Z930 Tracheostomy status: Secondary | ICD-10-CM | POA: Diagnosis not present

## 2015-12-06 DIAGNOSIS — M272 Inflammatory conditions of jaws: Secondary | ICD-10-CM | POA: Diagnosis not present

## 2015-12-06 DIAGNOSIS — I890 Lymphedema, not elsewhere classified: Secondary | ICD-10-CM | POA: Diagnosis not present

## 2015-12-08 ENCOUNTER — Ambulatory Visit (HOSPITAL_COMMUNITY)
Admission: RE | Admit: 2015-12-08 | Discharge: 2015-12-08 | Disposition: A | Discharge status: Home / Self Care | Payer: Medicare Other | Source: Ambulatory Visit | Attending: Acute Care | Admitting: Acute Care

## 2015-12-08 DIAGNOSIS — Z930 Tracheostomy status: Secondary | ICD-10-CM | POA: Diagnosis not present

## 2015-12-08 DIAGNOSIS — M272 Inflammatory conditions of jaws: Secondary | ICD-10-CM | POA: Insufficient documentation

## 2015-12-08 DIAGNOSIS — R220 Localized swelling, mass and lump, head: Secondary | ICD-10-CM | POA: Diagnosis not present

## 2015-12-08 NOTE — Progress Notes (Signed)
Tracheostomy Procedure Note  Nicholas Cobb [id number] 25-Dec-1950  Pre Procedure Tracheostomy Information  Trach Brand: Shiley Size: 6.0 Style: Proximal and Uncuffed Secured by: Velcro   Procedure: trach change    Post Procedure Tracheostomy Information  Trach Brand: Shiley Size: 6.0 Style: Proximal and Uncuffed Secured by: Velcro   Post Procedure Evaluation:  ETCO2 positive color change from yellow to purple : Yes.   Vital signs:blood pressure 151/98, pulse 81, respirations 20 and pulse oximetry 97 % Patients current condition: stable Complications: No apparent complications Trach site exam: clean, dry Wound care done: dry Patient did tolerate procedure well.   Education: none  Prescription needs: None     Additional needs: none

## 2015-12-09 NOTE — Progress Notes (Signed)
Routine trach care   Subjective:  Nicholas Cobb returns for return trach care visit   Patient ID: Nicholas Cobb, male    DOB: 1950-07-23, 65 y.o.   MRN: [id number]  HPI [name] is well known to me. Returns today for routine trach care in the setting of severe osteoradionecrosis of the jaw. Since last time I saw him he has been followed up at [facility] & recently placed on septra for presumed cellulitis involving his chronic lymphedema of his right face. He has his typical complaints, but was complementary of [facility] and is hopeful that his swelling may improve some w/ anticipated hyperbaric treatment in the near future. Apparently he had undergone this before w/ improvement. His only real complaint is his right facial swelling that extends over really the entire right side of his face (he has had this before to this extent as well).   Review of Systems  Constitutional: Negative for activity change and appetite change.  HENT: Positive for drooling and facial swelling.   Eyes:       Right facial swelling   Respiratory: Negative for apnea, cough, choking, chest tightness, shortness of breath, wheezing and stridor.   Cardiovascular: Negative.   Gastrointestinal: Negative.   Endocrine: Negative.   Genitourinary: Negative.   Musculoskeletal: Negative.   Neurological: Negative.   Hematological: Negative.   Psychiatric/Behavioral: Negative.     Vital signs:blood pressure 151/98, pulse 81, respirations 20 and pulse oximetry 97 %  Objective:   Physical Exam  Constitutional: He is oriented to person, place, and time. He appears well-developed and well-nourished. He appears distressed.  HENT:  Head:    Essentially the entire right side of face up to eye, and lower jaw.  Boxed off areas affected.   Cardiovascular: Normal rate, regular rhythm, S1 normal, S2 normal and normal pulses.  Exam reveals no gallop and no friction rub.   No murmur heard. Pulmonary/Chest: Effort normal and breath sounds  normal. No accessory muscle usage. No respiratory distress.  Abdominal: Soft. Normal appearance. He exhibits no shifting dullness. There is generalized tenderness.  Lymphadenopathy:       Head (right side): Submental and submandibular adenopathy present.  Neurological: He is alert and oriented to person, place, and time. He has normal strength. No cranial nerve deficit. GCS eye subscore is 4. GCS verbal subscore is 5. GCS motor subscore is 6.  Skin: Skin is warm and dry. He is not diaphoretic. No pallor.  Psychiatric: Thought content normal. His mood appears anxious. His speech is not slurred. Cognition and memory are normal. He does not exhibit a depressed mood.    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   [name] ACNP-BC [place] Pager # [phone] OR # [phone] if no answer

## 2015-12-15 DIAGNOSIS — C153 Malignant neoplasm of upper third of esophagus: Secondary | ICD-10-CM | POA: Diagnosis not present

## 2015-12-20 DIAGNOSIS — J988 Other specified respiratory disorders: Secondary | ICD-10-CM | POA: Diagnosis not present

## 2015-12-20 DIAGNOSIS — T66XXXS Radiation sickness, unspecified, sequela: Secondary | ICD-10-CM | POA: Diagnosis not present

## 2015-12-20 DIAGNOSIS — Z87891 Personal history of nicotine dependence: Secondary | ICD-10-CM | POA: Diagnosis not present

## 2015-12-20 DIAGNOSIS — R220 Localized swelling, mass and lump, head: Secondary | ICD-10-CM | POA: Diagnosis not present

## 2015-12-21 ENCOUNTER — Ambulatory Visit: Payer: Medicare Other | Admitting: Nurse Practitioner

## 2015-12-21 ENCOUNTER — Encounter: Payer: Self-pay | Admitting: Nurse Practitioner

## 2015-12-25 ENCOUNTER — Encounter (HOSPITAL_COMMUNITY): Payer: Self-pay | Admitting: Emergency Medicine

## 2015-12-25 ENCOUNTER — Ambulatory Visit (HOSPITAL_COMMUNITY)
Admission: EM | Admit: 2015-12-25 | Discharge: 2015-12-25 | Disposition: A | Discharge status: Home / Self Care | Payer: Medicare Other | Attending: Family Medicine | Admitting: Family Medicine

## 2015-12-25 DIAGNOSIS — J4541 Moderate persistent asthma with (acute) exacerbation: Secondary | ICD-10-CM | POA: Diagnosis not present

## 2015-12-25 MED ORDER — METHYLPREDNISOLONE ACETATE 80 MG/ML IJ SUSP
80.0000 mg | Freq: Once | INTRAMUSCULAR | Status: AC
  Administered 2015-12-25: 80 mg via INTRAMUSCULAR

## 2015-12-25 MED ORDER — ALBUTEROL SULFATE (2.5 MG/3ML) 0.083% IN NEBU
INHALATION_SOLUTION | RESPIRATORY_TRACT | Status: AC
  Filled 2015-12-25: qty 3

## 2015-12-25 MED ORDER — ALBUTEROL SULFATE (2.5 MG/3ML) 0.083% IN NEBU
2.5000 mg | INHALATION_SOLUTION | Freq: Once | RESPIRATORY_TRACT | Status: AC
  Administered 2015-12-25: 2.5 mg via RESPIRATORY_TRACT

## 2015-12-25 MED ORDER — METHYLPREDNISOLONE ACETATE 80 MG/ML IJ SUSP
INTRAMUSCULAR | Status: AC
  Filled 2015-12-25: qty 1

## 2015-12-25 MED ORDER — METHYLPREDNISOLONE ACETATE 80 MG/ML IJ SUSP
INTRAMUSCULAR | Status: AC
Start: 2015-12-25 — End: 2015-12-25
  Filled 2015-12-25: qty 1

## 2015-12-25 NOTE — Discharge Instructions (Signed)
If you have breathing worsens, please call EMS and be transported to the emergency room of choice.

## 2015-12-25 NOTE — ED Triage Notes (Signed)
Here for SOB and dyspnea... Pt is non verbal... Brought in a note book explaining his situation  Written down on book: "This collar has a twist in it. It came loose and I tried put it back and lock it and got a twist in it. Plus I have breathing problems. Nothing to do with the trac it is clean. I spray it with sterile water and suction it every 8 hours like I were instructed to do by my ENT specialist I use to have at the ENT clinic in [place] from 2001 until 2015. I had the trac changed on 12/08/2015 at the trac clinic at [facility]. The Collar came loose today at 1330. I tried to lock back and got a twist in it. I have some respiratory problem not trace. Maybe asthma or some thing respiratory problem. I need to get the collar on straight plus check this"   Has a trach in place  A&O x4... NAD

## 2015-12-25 NOTE — ED Provider Notes (Signed)
[facility]    CSN: [id number] Arrival date & time: 12/25/15  1623     History   Chief Complaint Chief Complaint  Patient presents with  . Shortness of Breath    HPI Nicholas Cobb is a 65 y.o. male.   This is a 65 year old gentleman with a tracheostomy. At 1:30 today he was reattaching his trach after cleaning it is usually does and he had a twist in the collar. He feels that he's having some tightness in his chest that may be asthma. After straighten the collar, he said he still has some shortness of breath like a breathing treatment.  Patient has had chronic breathing problems since his surgery on his neck and subsequent radiation.      Past Medical History:  Diagnosis Date  . Allergy   . Cancer ([place]) 1999   throat cancer  . Cervical spondylolysis 04/30/2011   severe  . Cervical spondylosis 03/25/2011  . Change in voice   . Chronic pain 03/25/2011  . Chronic sinusitis 04/27/2011  . Depression   . Difficulty urinating   . Emphysema 03/25/2011  . Essential hypertension, benign 12/03/2012  . GERD (gastroesophageal reflux disease) 03/25/2011  . Hearing loss   . Impaired glucose tolerance 04/27/2011  . 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 Rash   . Trouble swallowing   . Type II or unspecified type diabetes mellitus without mention of complication, uncontrolled 04/27/2011  . Ulcer ([place])   . UTI (lower urinary tract infection)   . Xerostomia 03/25/2011    Patient Active Problem List   Diagnosis Date Noted  . Throat cancer ([place])   . Controlled diabetes mellitus type 2 with complications ([place])   . Chronic respiratory failure ([place]) 06/14/2015  . Respiratory failure ([place]) 06/13/2015  . Tracheostomy dependence ([place])   . Cellulitis and abscess of head   . Cellulitis of face   . Tracheostomy status ([place])   . Acute sinus infection 10/29/2013  . Rash 10/29/2013  . Hyponatremia 12/03/2012  . Essential hypertension 12/03/2012  . Cervical spondylolysis 04/30/2011   . Chronic sinusitis 04/27/2011  . Diabetes 04/27/2011  . Erectile dysfunction 04/19/2011  . Preventative health care 03/25/2011  . Cervical spondylosis 03/25/2011  . Emphysema 03/25/2011  . GERD (gastroesophageal reflux disease) 03/25/2011  . Chronic pain 03/25/2011  . 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 Xerostomia 03/25/2011  . Tracheostomy in place [facility]) 02/04/2011  . Laryngeal cancer ([place]) 02/04/2011  . Encounter for PEG (percutaneous endoscopic gastrostomy) ([place]) 02/04/2011  . Attention to G-tube [facility]) 11/07/2010    Past Surgical History:  Procedure Laterality Date  . GASTROSTOMY TUBE PLACEMENT    . PEG TUBE PLACEMENT  11/1999  . TRACHEOSTOMY  11/1999       Home Medications    Prior to Admission medications   Medication Sig Start Date End Date Taking? Authorizing Provider  losartan (COZAAR) 100 MG tablet take 1 tablet by mouth once daily 06/24/15  Yes Gildardo Cranker, DO  ranitidine (ZANTAC) 150 MG tablet take 1 tablet by mouth twice a day 11/26/15  Yes [name], NP  AMBULATORY NON FORMULARY MEDICATION Trach(Shiley 6.0 XLT UP uncuffed), [place], [place] holders, Disposable inner cannulas 07/08/15   Gildardo Cranker, DO  betamethasone dipropionate 0.05 % lotion Apply topically 2 (two) times daily. To rash on leg 10/20/15   Gildardo Cranker, DO  ibuprofen (ADVIL,MOTRIN) 400 MG tablet Take one tablet by mouth three times daily as needed for pain 07/02/15   [facility]  Carter, DO  naphazoline-pheniramine (NAPHCON-A) 0.025-0.3 % ophthalmic solution Place 2 drops into both eyes 4 (four) times daily as needed for irritation (red eye).    Historical Provider, MD  Nutritional Supplements (FEEDING SUPPLEMENT, JEVITY 1.2 CAL,) LIQD Place 237 mLs into feeding tube See admin instructions. Pt takes 2 cans at breakfast, 2 cans at lunch, 2 cans at dinner and 2 at night 12/02/14   Gildardo Cranker, DO  oxymetazoline (12 HOUR NASAL SPRAY) 0.05 % nasal spray Place 2 sprays into the nose 2  (two) times daily as needed for congestion.     Historical Provider, MD  tamsulosin (FLOMAX) 0.4 MG CAPS capsule Take 1 capsule (0.4 mg total) by mouth daily. 08/06/14   [name], NP  triamcinolone cream (KENALOG) 0.1 % Apply 1 application topically 2 (two) times daily. 07/02/15   Gildardo Cranker, DO    Family History Family History  Problem Relation Age of Onset  . Diabetes Mother   . Heart disease Father   . Stroke Other   . Diabetes Other   . Cancer Other     lung cancer    Social History Social History  Substance Use Topics  . Smoking status: Former Smoker    Years: 23.00    Quit date: 11/06/1996  . Smokeless tobacco: Never Used  . Alcohol use No     Allergies   Oxybutynin chloride and Codeine   Review of Systems Review of Systems   Physical Exam Triage Vital Signs ED Triage Vitals  Enc Vitals Group     BP      Pulse      Resp      Temp      Temp src      SpO2      Weight      Height      Head Circumference      Peak Flow      Pain Score      Pain Loc      Pain Edu?      Excl. in [place]?    No data found.   Updated Vital Signs BP (!) 178/105 (BP Location: Left Arm)   Pulse 107   Temp 99.1 F (37.3 C) (Oral)   Resp (!) 34   SpO2 93%    Physical Exam  Constitutional: He is oriented to person, place, and time. He appears well-developed and well-nourished. He appears distressed.  HENT:  Head: Normocephalic.  Right Ear: External ear normal.  Left Ear: External ear normal.  Mouth/Throat: Oropharynx is clear and moist.  Eyes: Conjunctivae and EOM are normal. Pupils are equal, round, and reactive to light.  Neck:  Patient's obviously had surgical intervention on his neck and so does not rotate his neck well. He has a tracheostomy in place but has very noisy breathing.  Cardiovascular: Normal rate and regular rhythm.   Pulmonary/Chest: He is in respiratory distress.  Stridor through the tracheostomy appliance.  Musculoskeletal: Normal range of  motion.  Neurological: He is alert and oriented to person, place, and time.  Skin: Skin is warm and dry.  Nursing note and vitals reviewed.    UC Treatments / Results  Labs (all labs ordered are listed, but only abnormal results are displayed) Labs Reviewed - No data to display  EKG  EKG Interpretation None       Radiology No results found.  Procedures Procedures (including critical care time)  Medications Ordered in UC Medications  methylPREDNISolone acetate (DEPO-MEDROL) injection  80 mg (not administered)  albuterol (PROVENTIL) (2.5 MG/3ML) 0.083% nebulizer solution 2.5 mg (2.5 mg Nebulization Given 12/25/15 1642)     Initial Impression / Assessment and Plan / UC Course  I have reviewed the triage vital signs and the nursing notes.  Pertinent labs & imaging results that were available during my care of the patient were reviewed by me and considered in my medical decision making (see chart for details).  Clinical Course    Patient had his tracheostomy collar straightened. He then underwent a nebulizer treatment. He felt somewhat better but not perfect. He felt comfortable going home but would like a little additional medicine if possible. He said he would not like to go to the emergency department at [facility], but would call EMS if he gets any worse and try to get [facility]  Final Clinical Impressions(s) / UC Diagnoses   Final diagnoses:  Moderate persistent asthma with exacerbation    New Prescriptions Current Discharge Medication List       Robyn Haber, MD 12/25/15 1710

## 2015-12-25 NOTE — ED Notes (Signed)
Escorted to registration to finish registration and clerical staff calling cab for patient

## 2016-01-04 ENCOUNTER — Encounter: Payer: Self-pay | Admitting: Nurse Practitioner

## 2016-01-04 ENCOUNTER — Ambulatory Visit (INDEPENDENT_AMBULATORY_CARE_PROVIDER_SITE_OTHER): Payer: Medicare Other | Admitting: Nurse Practitioner

## 2016-01-04 VITALS — BP 126/74 | HR 85 | Temp 98.6°F | Resp 18 | Ht 69.0 in | Wt 166.0 lb

## 2016-01-04 DIAGNOSIS — R197 Diarrhea, unspecified: Secondary | ICD-10-CM | POA: Diagnosis not present

## 2016-01-04 DIAGNOSIS — Z930 Tracheostomy status: Secondary | ICD-10-CM

## 2016-01-04 DIAGNOSIS — E871 Hypo-osmolality and hyponatremia: Secondary | ICD-10-CM | POA: Diagnosis not present

## 2016-01-04 DIAGNOSIS — L853 Xerosis cutis: Secondary | ICD-10-CM | POA: Diagnosis not present

## 2016-01-04 DIAGNOSIS — J398 Other specified diseases of upper respiratory tract: Secondary | ICD-10-CM

## 2016-01-04 DIAGNOSIS — E119 Type 2 diabetes mellitus without complications: Secondary | ICD-10-CM

## 2016-01-04 DIAGNOSIS — Z1159 Encounter for screening for other viral diseases: Secondary | ICD-10-CM

## 2016-01-04 DIAGNOSIS — Z1211 Encounter for screening for malignant neoplasm of colon: Secondary | ICD-10-CM

## 2016-01-04 DIAGNOSIS — I10 Essential (primary) hypertension: Secondary | ICD-10-CM

## 2016-01-04 LAB — BASIC METABOLIC PANEL WITH GFR
BUN: 16 mg/dL (ref 7–25)
CHLORIDE: 95 mmol/L — AB (ref 98–110)
CO2: 28 mmol/L (ref 20–31)
Calcium: 9.2 mg/dL (ref 8.6–10.3)
Creat: 0.92 mg/dL (ref 0.70–1.25)
GFR, EST NON AFRICAN AMERICAN: 86 mL/min (ref >=60)
GLUCOSE: 90 mg/dL (ref 65–99)
POTASSIUM: 4.5 mmol/L (ref 3.5–5.3)
Sodium: 132 mmol/L — ABNORMAL LOW (ref 135–146)

## 2016-01-04 NOTE — Progress Notes (Signed)
Careteam: Patient Care Team: Nicholas Chandler, NP as PCP - General (Nurse Practitioner)   Allergies  Allergen Reactions  . Oxybutynin Chloride Nausea And Vomiting  . Codeine Nausea And Vomiting and Rash    Chief Complaint  Patient presents with  . Medical Management of Chronic Issues    2 month follow-up on DM and HTN  . Breathing Problem    Patient c/o breathing problems not related to Trache per ENT   . Immunizations    All immunizations up to date   . Orders    Opthamologist referral      HPI: Patient is a 65 y.o. male seen in the office today for a routine follow-up.  Increased mucus production.  Went to ENT at [facility] on 11/7 due to a respiratory problem. He reports he was told the respiratory problem had nothing to do with his trach and that he should discuss the problem with his PCP.  On 11/11 the patient went to Urgent care who administered a nebulizer treatment while the patient was present that provided some relief, they recommended that he make his PCP aware.  The patient does have a respirator at home that he wears over the trach, a trach mask and suction machine that ENT at [facility] had ordered for him. He uses the suction machine 3x/day as ordered by his ENT.  Initially denies shortness of breath stating that he just has the increased mucus due to a radiation overdose. After further questioning, the patient admits that he had shortness of breath and chest tightness prior to the breathing treatment he received at the urgent care.    In addition the patient reports diarrhea that has been going on for more than a year. He has used all sorts of OTC medication, none of which seem to help. If they do, it is only temporary. He is unable to take in food by mouth and only has his tube feedings, but he has been using them for 15 years and the diarrhea used to only be every once in a while.   He has had dry itching skin for more than a year as well.   Review of Systems:    Review of Systems  HENT:       Increased mucus production  Respiratory: Positive for chest tightness and shortness of breath.   Cardiovascular: Negative for chest pain and leg swelling.  Gastrointestinal: Positive for diarrhea.  Genitourinary: Positive for frequency.  Skin:       Dry, itchy skin    Past Medical History:  Diagnosis Date  . Allergy   . Cancer ([place]) 1999   throat cancer  . Cervical spondylolysis 04/30/2011   severe  . Cervical spondylosis 03/25/2011  . Change in voice   . Chronic pain 03/25/2011  . Chronic sinusitis 04/27/2011  . Depression   . Difficulty urinating   . Emphysema 03/25/2011  . Essential hypertension, benign 12/03/2012  . GERD (gastroesophageal reflux disease) 03/25/2011  . Hearing loss   . Impaired glucose tolerance 04/27/2011  . 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 Rash   . Trouble swallowing   . Type II or unspecified type diabetes mellitus without mention of complication, uncontrolled 04/27/2011  . Ulcer ([place])   . UTI (lower urinary tract infection)   . Xerostomia 03/25/2011   Past Surgical History:  Procedure Laterality Date  . GASTROSTOMY TUBE PLACEMENT    . PEG TUBE PLACEMENT  11/1999  . TRACHEOSTOMY  11/1999   Social  History:   reports that he quit smoking about 19 years ago. He quit after 23.00 years of use. He has never used smokeless tobacco. He reports that he does not drink alcohol or use drugs.  Family History  Problem Relation Age of Onset  . Diabetes Mother   . Heart disease Father   . Stroke Other   . Diabetes Other   . Cancer Other     lung cancer    Medications: Patient's Medications  New Prescriptions   No medications on file  Previous Medications   AMBULATORY NON FORMULARY MEDICATION    Trach(Shiley 6.0 XLT UP uncuffed), Trach Care Kit, [place] holders, Disposable inner cannulas   BETAMETHASONE DIPROPIONATE 0.05 % LOTION    Apply topically 2 (two) times daily. To rash on leg   IBUPROFEN (ADVIL,MOTRIN) 400 MG TABLET     Take one tablet by mouth three times daily as needed for pain   LOSARTAN (COZAAR) 100 MG TABLET    take 1 tablet by mouth once daily   NAPHAZOLINE-PHENIRAMINE (NAPHCON-A) 0.025-0.3 % OPHTHALMIC SOLUTION    Place 2 drops into both eyes 4 (four) times daily as needed for irritation (red eye).   NUTRITIONAL SUPPLEMENTS (FEEDING SUPPLEMENT, JEVITY 1.2 CAL,) LIQD    Place 237 mLs into feeding tube See admin instructions. Pt takes 2 cans at breakfast, 2 cans at lunch, 2 cans at dinner and 2 at night   OXYMETAZOLINE (12 HOUR NASAL SPRAY) 0.05 % NASAL SPRAY    Place 2 sprays into the nose 2 (two) times daily as needed for congestion.    RANITIDINE (ZANTAC) 150 MG TABLET    take 1 tablet by mouth twice a day   TAMSULOSIN (FLOMAX) 0.4 MG CAPS CAPSULE    Take 1 capsule (0.4 mg total) by mouth daily.   TRIAMCINOLONE CREAM (KENALOG) 0.1 %    Apply 1 application topically 2 (two) times daily.  Modified Medications   No medications on file  Discontinued Medications   No medications on file     Physical Exam:  Vitals:   01/04/16 1359  BP: 126/74  Pulse: 85  Resp: 18  Temp: 98.6 F (37 C)  TempSrc: Oral  SpO2: 93%  Weight: 166 lb (75.3 kg)  Height: 5' 9" (1.753 m)   Body mass index is 24.51 kg/m.  Physical Exam  Constitutional: He is oriented to person, place, and time. He appears well-developed and well-nourished.  HENT:  Facial swelling R>L  Neck:  Trach present  Cardiovascular: Normal rate, regular rhythm and normal heart sounds.   Pulmonary/Chest: Effort normal and breath sounds normal.  Abdominal: Soft. Bowel sounds are normal.  PEG tube in place  Musculoskeletal: Normal range of motion. He exhibits no edema.  Neurological: He is alert and oriented to person, place, and time.  Skin: Skin is warm and dry.  Psychiatric: Judgment and thought content normal.  Writes to communicate   Patient refused diabetic foot exam.   Labs reviewed: Basic Metabolic Panel:  Recent Labs   06/13/15 0701  09/14/15 0440 09/15/15 0205 09/16/15 0327 10/20/15 0938  NA 135  < >  --  133* 133* 131*  K 5.3*  < >  --  3.7 3.7 4.4  CL 104  < >  --  99* 98* 92*  CO2 21*  < >  --  _0 GLUCOSE 95  < >  --  128* 130* 90  BUN 17  < >  --  12 11  17  CREATININE 0.99  < >  --  0.91 0.90 1.00  CALCIUM 8.2*  < >  --  9.0 9.1 9.5  MG 2.0  --  2.4 2.0  --   --   PHOS 2.9  --  5.8* 2.9  --   --   < > = values in this interval not displayed. Liver Function Tests:  Recent Labs  06/13/15 0218 09/16/15 0327 10/20/15 0938  AST 37 79* 27  ALT 27 67* 19  ALKPHOS 113 101 112  BILITOT 0.4 0.4 0.4  PROT 7.8 6.3* 7.5  ALBUMIN 4.2 3.0* 4.2    Recent Labs  06/13/15 0701  LIPASE 26   No results for input(s): AMMONIA in the last 8760 hours. CBC:  Recent Labs  06/13/15 0218  06/14/15 0416 09/14/15 0255 09/14/15 0308 09/15/15 0205 09/16/15 0327  WBC 9.1  < > 6.6 11.5*  --  10.6* 6.5  NEUTROABS 4.1  --  5.0 7.4  --   --   --   HGB 14.7  < > 13.7 14.8 16.7 14.0 13.4  HCT 44.4  < > 40.5 45.5 49.0 41.8 41.2  MCV 93.5  < > 92.3 92.5  --  92.3 91.8  PLT 253  < > 204 231  --  193 184  < > = values in this interval not displayed. Lipid Panel:  Recent Labs  06/13/15 0218 09/14/15 0255  TRIG 120 119   TSH: No results for input(s): TSH in the last 8760 hours. A1C: Lab Results  Component Value Date   HGBA1C 6.5 (H) 10/20/2015     Assessment/Plan 1. Essential hypertension, benign - Controlled. BP 126/74 today. - Continue losartan. - BMP with eGFR  2. Increased tracheal secretions - The patient reports increased mucus. He has seen the ENT and been to the urgent care regarding this issue. He is being referred to pulmonology. Denies shortness of breath or chest tightness today.   3. Diarrhea, unspecified type - Ongoing problem. It used to only be every once in a while, now more frequently - Ambulatory referral to Gastroenterology-- this is the second time this referral  has been placed, missed appt last time  4. Special screening for malignant neoplasms, colon - Patient agreeable for colonoscopy.  - Ambulatory referral to Gastroenterology  5. Type 2 diabetes mellitus without complication, without long-term current use of insulin (HCC) - Diet controlled. HgbA1c 6.5 on 10/20/15 - Ambulatory referral to Ophthalmology  6. Tracheostomy in place  [facility]) - Current complaints are regarding the increased mucus production. - Has trach mask, respirator, and suction machine at home. Uses suction 3x/day. - Ambulatory referral to Pulmonology  7. Hyponatremia - Sodium 131 on 10/20/15, will follow-up today. Free water may need to be decreased through his PEG tube. He currently uses 8 oz with each feeding. - BMP with eGFR  8. Need for hepatitis C screening test - Recommended screening for those born between 1945-1965 - Hep C Antibody  9. Dry skin - Recommended Eucerin cream daily   Follow-up in 4 months  Tisheena Maguire K. Harle Battiest  [facility] [phone] 8 am - 5 pm) [phone] (after hours)

## 2016-01-05 ENCOUNTER — Other Ambulatory Visit: Payer: Self-pay | Admitting: Internal Medicine

## 2016-01-05 LAB — HEPATITIS C ANTIBODY: HCV Ab: NEGATIVE

## 2016-01-14 DIAGNOSIS — C153 Malignant neoplasm of upper third of esophagus: Secondary | ICD-10-CM | POA: Diagnosis not present

## 2016-01-15 DIAGNOSIS — C153 Malignant neoplasm of upper third of esophagus: Secondary | ICD-10-CM | POA: Diagnosis not present

## 2016-01-19 ENCOUNTER — Other Ambulatory Visit: Payer: Self-pay | Admitting: Internal Medicine

## 2016-01-26 ENCOUNTER — Ambulatory Visit (INDEPENDENT_AMBULATORY_CARE_PROVIDER_SITE_OTHER)
Admission: RE | Admit: 2016-01-26 | Discharge: 2016-01-26 | Disposition: A | Discharge status: Home / Self Care | Payer: Medicare Other | Source: Ambulatory Visit | Attending: Pulmonary Disease | Admitting: Pulmonary Disease

## 2016-01-26 ENCOUNTER — Ambulatory Visit (INDEPENDENT_AMBULATORY_CARE_PROVIDER_SITE_OTHER): Payer: Medicare Other | Admitting: Pulmonary Disease

## 2016-01-26 ENCOUNTER — Encounter: Payer: Self-pay | Admitting: Pulmonary Disease

## 2016-01-26 ENCOUNTER — Telehealth: Payer: Self-pay | Admitting: Pulmonary Disease

## 2016-01-26 ENCOUNTER — Other Ambulatory Visit (INDEPENDENT_AMBULATORY_CARE_PROVIDER_SITE_OTHER): Payer: Medicare Other

## 2016-01-26 VITALS — BP 182/92 | HR 82 | Ht 69.0 in | Wt 169.6 lb

## 2016-01-26 DIAGNOSIS — Z930 Tracheostomy status: Secondary | ICD-10-CM | POA: Diagnosis not present

## 2016-01-26 DIAGNOSIS — R042 Hemoptysis: Secondary | ICD-10-CM

## 2016-01-26 DIAGNOSIS — R0602 Shortness of breath: Secondary | ICD-10-CM | POA: Diagnosis not present

## 2016-01-26 LAB — CBC WITH DIFFERENTIAL/PLATELET
BASOS PCT: 0.3 % (ref 0.0–3.0)
Basophils Absolute: 0 10*3/uL (ref 0.0–0.1)
Eosinophils Absolute: 0 10*3/uL (ref 0.0–0.7)
Eosinophils Relative: 0.8 % (ref 0.0–5.0)
HCT: 45.2 % (ref 39.0–52.0)
HEMOGLOBIN: 15.2 g/dL (ref 13.0–17.0)
LYMPHS ABS: 0.9 10*3/uL (ref 0.7–4.0)
Lymphocytes Relative: 18 % (ref 12.0–46.0)
MCHC: 33.6 g/dL (ref 30.0–36.0)
MCV: 89.7 fl (ref 78.0–100.0)
MONO ABS: 1 10*3/uL (ref 0.1–1.0)
Monocytes Relative: 18.8 % — ABNORMAL HIGH (ref 3.0–12.0)
NEUTROS PCT: 62.1 % (ref 43.0–77.0)
Neutro Abs: 3.2 10*3/uL (ref 1.4–7.7)
Platelets: 221 10*3/uL (ref 150.0–400.0)
RBC: 5.03 Mil/uL (ref 4.22–5.81)
RDW: 15 % (ref 11.5–15.5)
WBC: 5.2 10*3/uL (ref 4.0–10.5)

## 2016-01-26 LAB — APTT: aPTT: 34.4 s — ABNORMAL HIGH (ref 23.4–32.7)

## 2016-01-26 LAB — PROTIME-INR
INR: 1.1 ratio — AB (ref 0.8–1.0)
PROTHROMBIN TIME: 11.3 s (ref 9.6–13.1)

## 2016-01-26 NOTE — Progress Notes (Signed)
Subjective:    Patient ID: Nicholas Cobb, male    DOB: 29-Mar-1950, 65 y.o.   MRN: [id number]  [facility].:  Follow-up for Tracheostomy with Increased Mucus.  HPI Patient follows with our tracheostomy clinic with severe osteoradionecrosis of jaw. Currently followed at [facility] by ENT. Has a history of presumed cellulitis as well as chronic lymphedema of his right face and tracheostomy and was previously on Bactrim in October. Tracheostomy last changed in October. The patient denies any fever or chills. He has noticed increased mucus production from his tracheostomy. He has noticed bloody mucus at times and sometimes mixed in with his usual mucus that is yellow with some white for the last 2-3 months. He has noticed increased dyspnea. No recent sick contacts.   Review of Systems Denies any chest pain but does have tightness at times. He has noticed increased sinus congestion & drainage. Denies any nausea or emesis.   Allergies  Allergen Reactions  . Oxybutynin Chloride Nausea And Vomiting  . Codeine Nausea And Vomiting and Rash    Current Outpatient Prescriptions on File Prior to Visit  Medication Sig Dispense Refill  . AMBULATORY NON FORMULARY MEDICATION Trach(Shiley 6.0 XLT UP uncuffed), Trach Care Kit, Trach holders, Disposable inner cannulas 1 each 0  . betamethasone dipropionate 0.05 % lotion Apply topically 2 (two) times daily. To rash on leg 60 mL 2  . ibuprofen (ADVIL,MOTRIN) 400 MG tablet take 1 tablet by mouth three times a day if needed for pain 90 tablet 3  . losartan (COZAAR) 100 MG tablet take 1 tablet by mouth once daily 30 tablet 5  . naphazoline-pheniramine (NAPHCON-A) 0.025-0.3 % ophthalmic solution Place 2 drops into both eyes 4 (four) times daily as needed for irritation (red eye).    . Nutritional Supplements (FEEDING SUPPLEMENT, JEVITY 1.2 CAL,) LIQD Place 237 mLs into feeding tube See admin instructions. Pt takes 2 cans at breakfast, 2 cans at  lunch, 2 cans at dinner and 2 at night 56880 mL 11  . oxymetazoline (12 HOUR NASAL SPRAY) 0.05 % nasal spray Place 2 sprays into the nose 2 (two) times daily as needed for congestion.     . ranitidine (ZANTAC) 150 MG tablet take 1 tablet by mouth twice a day 180 tablet 1  . tamsulosin (FLOMAX) 0.4 MG CAPS capsule Take 1 capsule (0.4 mg total) by mouth daily. 30 capsule 3  . triamcinolone cream (KENALOG) 0.1 % Apply 1 application topically 2 (two) times daily. 45 g 5   No current facility-administered medications on file prior to visit.     Past Medical History:  Diagnosis Date  . Allergy   . Cancer ([place]) 1999   throat cancer  . Cervical spondylolysis 04/30/2011   severe  . Cervical spondylosis 03/25/2011  . Change in voice   . Chronic pain 03/25/2011  . Chronic sinusitis 04/27/2011  . Depression   . Difficulty urinating   . Emphysema 03/25/2011  . Essential hypertension, benign 12/03/2012  . GERD (gastroesophageal reflux disease) 03/25/2011  . Hearing loss   . Impaired glucose tolerance 04/27/2011  . 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 Rash   . Trouble swallowing   . Type II or unspecified type diabetes mellitus without mention of complication, uncontrolled 04/27/2011  . Ulcer ([place])   . UTI (lower urinary tract infection)   . Xerostomia 03/25/2011    Past Surgical History:  Procedure Laterality Date  . GASTROSTOMY TUBE PLACEMENT    . PEG TUBE PLACEMENT  11/1999  . TRACHEOSTOMY  11/1999    Family History  Problem Relation Age of Onset  . Diabetes Mother   . Heart disease Father   . Stroke Other   . Diabetes Other   . Cancer Other     lung cancer    Social History   Social History  . Marital status: Single    Spouse name: N/A  . Number of children: N/A  . Years of education: 98   Occupational History  . retired Disabled   Social History Main Topics  . Smoking status: Former Smoker    Packs/day: 1.00    Years: 23.00    Quit date: 11/06/1996  . Smokeless tobacco:  Never Used  . Alcohol use No  . Drug use: No  . Sexual activity: Not on file   Other Topics Concern  . Not on file   Social History Narrative   Pt. Lives in a apartment, 1 story, 2 people live in the home, No pet's. Pt. Does exercise (walk) and yes pt. Has a living will.      Objective:   Physical Exam BP (!) 182/92 (BP Location: Left Arm, Cuff Size: Normal)   Pulse 82   Ht '5\' 9"'$  (1.753 m)   Wt 169 lb 9.6 oz (76.9 kg)   SpO2 94%   BMI 25.05 kg/m  General:  Awake. Alert. No acute distress.   Integument:  Warm & dry. No rash on exposed skin.  Lymphatics:  No appreciated cervical or supraclavicular lymphadenoapthy. HEENT:  Moist mucus membranes. No oral ulcers. No scleral injection or icterus. Swelling in right face/neck. Tracheostomy in place. Cardiovascular:  Regular rate. No edema. Unable to appreciate JVD given facial edema.  Pulmonary:  Good aeration & clear to auscultation bilaterally. Symmetric chest wall expansion. No accessory muscle use on room air. Abdomen: Soft. Normal bowel sounds. Nontender.  IMAGING PORT CXR 09/15/15 (personally reviewed by me): Tracheostomy noted to be in place. Lordotic view somewhat rotated to the right. No appreciated pleural effusion. No parenchymal mass or opacity appreciated.  LABS 03/28/14 ABG on FiO2 0.8 (T-collar):  7.315 / 47.6 / 114 / 97.5%    Assessment & Plan:  65 y.o. male with chronic tracheostomy as well as right facial edema from osteonecrosis of the jaw. Patient is having hemoptysis which could signify a possible infectious etiology but given the fact that this started previously with him on Bactrim I am somewhat skeptical. It is also unclear whether or not this is coming from his lungs or possibly from his retropharyngeal space but as he reports it is coming out of his tracheostomy I feel evaluating with chest x-ray imaging is quite reasonable. I did instruct the patient that he should seek immediate medical attention if the  hemoptysis worsens in any way.  1. Hemoptysis:  Checking sputum/tracheal aspirate culture for AFB, fungus, and bacteria. Checking serum CBC with differential, Procalcitonin, INR, & PTT. Also checking chest x-ray PA/LAT today. Holding off on antibiotics pending result. 2. Tracheostomy: Status post change in October. Holding off on repeat imaging of neck/retropharyngeal space. 3. Follow-up: Patient to return to clinic in 2 weeks to be seen by either myself or P. Babcock in tracheostomy clinic.  Sonia Baller Ashok Cordia, M.D. [facility] Pager:  [phone] After 3pm or if no response, call [phone] 5:07 PM 01/26/16

## 2016-01-26 NOTE — Patient Instructions (Signed)
   E-mail if you have any questions.  Seek immediate medical attention if your bloody mucus gets worse.  We will schedule you back to see either me or Marni Griffon in a couple of weeks.  TESTS ORDERED: 1. CXR PA/LAT TODAY 2. Serum CBC with differential, Procalcitonin, INR and PTT.  3. Sputum/Tracheal Aspirate culture for AFB, Fungus, & Bacteria

## 2016-01-26 NOTE — Telephone Encounter (Signed)
IMAGING PORT CXR 09/15/15 (personally reviewed by me): Tracheostomy noted to be in place. Lordotic view somewhat rotated to the right. No appreciated pleural effusion. No parenchymal mass or opacity appreciated.  LABS 03/28/14 ABG on FiO2 0.8 (T-collar):  7.315 / 47.6 / 114 / 97.5%

## 2016-01-28 DIAGNOSIS — H9319 Tinnitus, unspecified ear: Secondary | ICD-10-CM | POA: Diagnosis not present

## 2016-01-28 DIAGNOSIS — C153 Malignant neoplasm of upper third of esophagus: Secondary | ICD-10-CM | POA: Diagnosis not present

## 2016-01-30 ENCOUNTER — Encounter (HOSPITAL_COMMUNITY): Payer: Self-pay | Admitting: Nurse Practitioner

## 2016-01-30 ENCOUNTER — Emergency Department (HOSPITAL_COMMUNITY): Payer: Medicare Other

## 2016-01-30 ENCOUNTER — Emergency Department (HOSPITAL_COMMUNITY)
Admission: EM | Admit: 2016-01-30 | Discharge: 2016-01-30 | Disposition: A | Discharge status: Home / Self Care | Payer: Medicare Other | Attending: Emergency Medicine | Admitting: Emergency Medicine

## 2016-01-30 DIAGNOSIS — Z930 Tracheostomy status: Secondary | ICD-10-CM | POA: Insufficient documentation

## 2016-01-30 DIAGNOSIS — R0600 Dyspnea, unspecified: Secondary | ICD-10-CM | POA: Diagnosis not present

## 2016-01-30 DIAGNOSIS — Z85819 Personal history of malignant neoplasm of unspecified site of lip, oral cavity, and pharynx: Secondary | ICD-10-CM | POA: Diagnosis not present

## 2016-01-30 DIAGNOSIS — R062 Wheezing: Secondary | ICD-10-CM | POA: Diagnosis not present

## 2016-01-30 DIAGNOSIS — Z79899 Other long term (current) drug therapy: Secondary | ICD-10-CM | POA: Insufficient documentation

## 2016-01-30 DIAGNOSIS — E119 Type 2 diabetes mellitus without complications: Secondary | ICD-10-CM | POA: Diagnosis not present

## 2016-01-30 DIAGNOSIS — I10 Essential (primary) hypertension: Secondary | ICD-10-CM | POA: Diagnosis not present

## 2016-01-30 DIAGNOSIS — Z87891 Personal history of nicotine dependence: Secondary | ICD-10-CM | POA: Diagnosis not present

## 2016-01-30 DIAGNOSIS — R069 Unspecified abnormalities of breathing: Secondary | ICD-10-CM | POA: Diagnosis not present

## 2016-01-30 DIAGNOSIS — R0602 Shortness of breath: Secondary | ICD-10-CM | POA: Diagnosis not present

## 2016-01-30 LAB — CBC WITH DIFFERENTIAL/PLATELET
BASOS ABS: 0 10*3/uL (ref 0.0–0.1)
Basophils Relative: 0 %
EOS ABS: 0.1 10*3/uL (ref 0.0–0.7)
EOS PCT: 2 %
HCT: 43.1 % (ref 39.0–52.0)
Hemoglobin: 14.5 g/dL (ref 13.0–17.0)
LYMPHS PCT: 19 %
Lymphs Abs: 0.9 10*3/uL (ref 0.7–4.0)
MCH: 30 pg (ref 26.0–34.0)
MCHC: 33.6 g/dL (ref 30.0–36.0)
MCV: 89 fL (ref 78.0–100.0)
Monocytes Absolute: 0.7 10*3/uL (ref 0.1–1.0)
Monocytes Relative: 14 %
NEUTROS PCT: 65 %
Neutro Abs: 3.3 10*3/uL (ref 1.7–7.7)
PLATELETS: 197 10*3/uL (ref 150–400)
RBC: 4.84 MIL/uL (ref 4.22–5.81)
RDW: 13.5 % (ref 11.5–15.5)
WBC: 5.1 10*3/uL (ref 4.0–10.5)

## 2016-01-30 LAB — BASIC METABOLIC PANEL
ANION GAP: 7 (ref 5–15)
BUN: 19 mg/dL (ref 6–20)
CHLORIDE: 93 mmol/L — AB (ref 101–111)
CO2: 31 mmol/L (ref 22–32)
Calcium: 8.9 mg/dL (ref 8.9–10.3)
Creatinine, Ser: 1.08 mg/dL (ref 0.61–1.24)
Glucose, Bld: 168 mg/dL — ABNORMAL HIGH (ref 65–99)
POTASSIUM: 4.4 mmol/L (ref 3.5–5.1)
SODIUM: 131 mmol/L — AB (ref 135–145)

## 2016-01-30 LAB — PROCALCITONIN: Procalcitonin: <0.1 ng/mL (ref <0.10)

## 2016-01-30 MED ORDER — PREDNISONE 50 MG PO TABS: ORAL_TABLET | ORAL | 0 refills | Status: DC

## 2016-01-30 MED ORDER — ALBUTEROL SULFATE (2.5 MG/3ML) 0.083% IN NEBU
5.0000 mg | INHALATION_SOLUTION | Freq: Once | RESPIRATORY_TRACT | Status: AC
  Administered 2016-01-30: 5 mg via RESPIRATORY_TRACT
  Filled 2016-01-30: qty 6

## 2016-01-30 MED ORDER — METHYLPREDNISOLONE SODIUM SUCC 125 MG IJ SOLR
125.0000 mg | Freq: Once | INTRAMUSCULAR | Status: AC
  Administered 2016-01-30: 125 mg via INTRAVENOUS
  Filled 2016-01-30: qty 2

## 2016-01-30 MED ORDER — IPRATROPIUM BROMIDE 0.02 % IN SOLN
0.5000 mg | Freq: Once | RESPIRATORY_TRACT | Status: AC
  Administered 2016-01-30: 0.5 mg via RESPIRATORY_TRACT
  Filled 2016-01-30: qty 2.5

## 2016-01-30 NOTE — ED Provider Notes (Signed)
[facility] DEPT Provider Note   CSN: [id number] Arrival date & time: 01/30/16  2353    By signing my name below, I, Macon Large, attest that this documentation has been prepared under the direction and in the presence of Ripley Fraise, MD. Electronically Signed: Macon Large, ED Scribe. 01/30/16. 3:04 AM.  History   Chief Complaint Chief Complaint  Patient presents with  . Respiratory Distress   HPI  LEVEL 5 CAVEAT: HPI and ROS limited due to acuity of condition.  Per nurse note, pt called EMS complaining of respiratory distress. PTA he was able to write down that he presents with a hx of asthma and has difficulty breathing. On EMS arrival, pt received nebulizer. O2 sats 87% and was placed on non-rebreather mask and O2 sats 100%. Pt denies hx of CHF. Pt notes tracheostomy in place and has facial lymphedema. Pt able to ambulate without difficulty. Pt notes feeling out of breathe in Ed. He denies swelling in the lower extremities. No additional complaints at this time.   Past Medical History:  Diagnosis Date  . Allergy   . Cancer ([place]) 1999   throat cancer  . Cervical spondylolysis 04/30/2011   severe  . Cervical spondylosis 03/25/2011  . Change in voice   . Chronic pain 03/25/2011  . Chronic sinusitis 04/27/2011  . Depression   . Difficulty urinating   . Emphysema 03/25/2011  . Essential hypertension, benign 12/03/2012  . GERD (gastroesophageal reflux disease) 03/25/2011  . Hearing loss   . Impaired glucose tolerance 04/27/2011  . 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 Rash   . Trouble swallowing   . Type II or unspecified type diabetes mellitus without mention of complication, uncontrolled 04/27/2011  . Ulcer ([place])   . UTI (lower urinary tract infection)   . Xerostomia 03/25/2011    Patient Active Problem List   Diagnosis Date Noted  . Hemoptysis 01/26/2016  . Throat cancer ([place])   . Controlled diabetes mellitus type 2 with complications ([place])   . Chronic  respiratory failure ([place]) 06/14/2015  . Respiratory failure ([place]) 06/13/2015  . Tracheostomy dependence ([place])   . Cellulitis and abscess of head   . Cellulitis of face   . Tracheostomy status ([place])   . Acute sinus infection 10/29/2013  . Rash 10/29/2013  . Hyponatremia 12/03/2012  . Essential hypertension 12/03/2012  . Cervical spondylolysis 04/30/2011  . Chronic sinusitis 04/27/2011  . Diabetes 04/27/2011  . Erectile dysfunction 04/19/2011  . Preventative health care 03/25/2011  . Cervical spondylosis 03/25/2011  . Emphysema 03/25/2011  . GERD (gastroesophageal reflux disease) 03/25/2011  . Chronic pain 03/25/2011  . 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 Xerostomia 03/25/2011  . Tracheostomy in place [facility]) 02/04/2011  . Laryngeal cancer ([place]) 02/04/2011  . Encounter for PEG (percutaneous endoscopic gastrostomy) ([place]) 02/04/2011  . Attention to G-tube [facility]) 11/07/2010    Past Surgical History:  Procedure Laterality Date  . GASTROSTOMY TUBE PLACEMENT    . PEG TUBE PLACEMENT  11/1999  . TRACHEOSTOMY  11/1999       Home Medications    Prior to Admission medications   Medication Sig Start Date End Date Taking? Authorizing Provider  AMBULATORY NON FORMULARY MEDICATION Trach(Shiley 6.0 XLT UP uncuffed), [place], [place] holders, Disposable inner cannulas 07/08/15  Yes Gildardo Cranker, DO  ibuprofen (ADVIL,MOTRIN) 400 MG tablet take 1 tablet by mouth three times a day if needed for pain Patient taking differently: Take 400 mg by mouth three times daily as needed for pain  01/05/16  Yes Gildardo Cranker, DO  losartan (COZAAR) 100 MG tablet take 1 tablet by mouth once daily Patient taking differently: Take 100 mg by mouth once daily 01/19/16  Yes Gildardo Cranker, DO  naphazoline-pheniramine (NAPHCON-A) 0.025-0.3 % ophthalmic solution Place 2 drops into both eyes 4 (four) times daily as needed for irritation (red eye).   Yes Historical Provider, MD  oxymetazoline (12 HOUR NASAL  SPRAY) 0.05 % nasal spray Place 2 sprays into the nose 2 (two) times daily as needed for congestion.    Yes Historical Provider, MD  ranitidine (ZANTAC) 150 MG tablet take 1 tablet by mouth twice a day Patient taking differently: Take 150 mg by mouth twice daily 11/26/15  Yes [name], NP  triamcinolone cream (KENALOG) 0.1 % Apply 1 application topically 2 (two) times daily. Patient taking differently: Apply 1 application topically 2 (two) times daily as needed (rash).  07/02/15  Yes Gildardo Cranker, DO  betamethasone dipropionate 0.05 % lotion Apply topically 2 (two) times daily. To rash on leg Patient not taking: Reported on 01/30/2016 10/20/15   Gildardo Cranker, DO  Nutritional Supplements (FEEDING SUPPLEMENT, JEVITY 1.2 CAL,) LIQD Place 237 mLs into feeding tube See admin instructions. Pt takes 2 cans at breakfast, 2 cans at lunch, 2 cans at dinner and 2 at night Patient not taking: Reported on 01/30/2016 12/02/14   Gildardo Cranker, DO    Family History Family History  Problem Relation Age of Onset  . Diabetes Mother   . Heart disease Father   . Stroke Other   . Diabetes Other   . Cancer Other     lung cancer    Social History Social History  Substance Use Topics  . Smoking status: Former Smoker    Packs/day: 1.00    Years: 23.00    Quit date: 11/06/1996  . Smokeless tobacco: Never Used  . Alcohol use No     Allergies   Oxybutynin chloride and Codeine   Review of Systems Review of Systems  Unable to perform ROS: Acuity of condition     Physical Exam Updated Vital Signs BP 154/95   Pulse 96   Resp 26   Ht '5\' 9"'$  (1.753 m)   Wt 169 lb (76.7 kg)   SpO2 100%   BMI 24.96 kg/m   Physical Exam  CONSTITUTIONAL: chronically ill appearing HEAD: Normocephalic/atraumatic EYES: EOMI ENMT: Mucous membranes moist. right chronic facial lymphedema noted NECK: supple no meningeal signs. Tracheostomy in place, no blood noted, no drainage noted SPINE/BACK:entire spine  nontender CV: S1/S2 noted, no murmurs/rubs/gallops noted LUNGS: tachypneic and wheezing bilaterally  ABDOMEN: soft, nontender, peg tube in place  GU:no cva tenderness NEURO: Pt is awake/alert/appropriate, moves all extremitiesx4.   EXTREMITIES: pulses normal/equal, full ROM SKIN: warm, color normal PSYCH: no abnormalities of mood noted, alert and oriented to situation  ED Treatments / Results    COORDINATION OF CARE: 3:01 AM Discussed treatment plan with pt at bedside which includes labs, chest XR, EKG, albuterol and steroids and pt agreed to plan.   Labs (all labs ordered are listed, but only abnormal results are displayed) Labs Reviewed  BASIC METABOLIC PANEL - Abnormal; Notable for the following:       Result Value   Sodium 131 (*)    Chloride 93 (*)    Glucose, Bld 168 (*)    All other components within normal limits  CBC WITH DIFFERENTIAL/PLATELET    EKG  EKG Interpretation  Date/Time:  Sunday January 30 2016  03:46:18 EST Ventricular Rate:  97 PR Interval:    QRS Duration: 80 QT Interval:  348 QTC Calculation: 442 R Axis:   61 Text Interpretation:  Sinus rhythm Consider right atrial enlargement Probable inferior infarct, old Probable anterolateral infarct, old Confirmed by Christy Gentles  MD, [place] ([id number]) on 01/30/2016 4:03:17 AM       Radiology Dg Chest Portable 1 View  Result Date: 01/30/2016 CLINICAL DATA:  Shortness of breath. History of head and neck cancer, emphysema, tracheostomy and gastrostomy tube. EXAM: PORTABLE CHEST 1 VIEW COMPARISON:  Chest radiograph January 26, 2016 FINDINGS: Cardiomediastinal silhouette is unremarkable for this low inspiratory examination with crowded vasculature markings. Calcified aortic knob. Tracheostomy tube tip projects 2.2 cm above the carina. The lungs are clear without pleural effusions or focal consolidations. Trachea projects midline and there is no pneumothorax. Included soft tissue planes and osseous structures are  non-suspicious. Surgical clips RIGHT neck. RIGHT mandible ORIF. IMPRESSION: No acute cardiopulmonary process. Electronically Signed   By: Elon Alas M.D.   On: 01/30/2016 03:55    Procedures Procedures (including critical care time)  Medications Ordered in ED Medications  albuterol (PROVENTIL) (2.5 MG/3ML) 0.083% nebulizer solution 5 mg (5 mg Nebulization Given 01/30/16 0319)  ipratropium (ATROVENT) nebulizer solution 0.5 mg (0.5 mg Nebulization Given 01/30/16 0319)  methylPREDNISolone sodium succinate (SOLU-MEDROL) 125 mg/2 mL injection 125 mg (125 mg Intravenous Given 01/30/16 0312)     Initial Impression / Assessment and Plan / ED Course  I have reviewed the triage vital signs and the nursing notes.  Pertinent labs & imaging results that were available during my care of the patient were reviewed by me and considered in my medical decision making (see chart for details).  Clinical Course     3:29 AM Pt with h/o osteoradionecrosis of jaw with chronic lymphedema and has trach in place here with wheezing He is unable to verbally communicate Nebs ordered Imaging/labs ordered 4:37 AM Pt improved No distress Lung sounds clear He has been suctioned by respiratory 6:33 AM Pt improved No signs of distress noted He has improved He feels comfortable for d/c home He communicates via writing as he is nonverbal I would like to start steroids for his wheezing.  He requests tablet form that he can crush and use via PEG. EPIC message sent to his pulmonologist as well  Final Clinical Impressions(s) / ED Diagnoses   Final diagnoses:  Wheezing  Dyspnea, unspecified type  Tracheostomy dependence (HCC)    New Prescriptions New Prescriptions   PREDNISONE (DELTASONE) 50 MG TABLET    1 tablet PO QD X4 days    I personally performed the services described in this documentation, which was scribed in my presence. The recorded information has been reviewed and is accurate.         Ripley Fraise, MD 01/30/16 [phone]

## 2016-01-30 NOTE — ED Triage Notes (Signed)
Patient called EMS with respiratory distress. On arrival he was able to write down that he has a history of asthma and having difficulty breathing. When EMS arrived he was partially dressed and requested to be taken to [facility]. Was informed he needed to come to closest facility for care. He was able to get completely dressed while EMS awaited to transport. He received nebulizer. O2 sats 87% and was placed on NRB and O2 sats 100% NRB.  128 HR down to 103 180/110, denies history of CHF. He has facial lymphedema and tracheostomy in place. He is A&O x4, no neuro complications. Patient was able to ambulate independently from ambulance to [place] room.

## 2016-01-30 NOTE — ED Notes (Signed)
Bed: RESB Expected date:  Expected time:  Means of arrival:  Comments: 65yo M resp distress

## 2016-02-01 ENCOUNTER — Telehealth: Payer: Self-pay

## 2016-02-01 NOTE — Telephone Encounter (Signed)
lmtcb X1 for pt to schedule hospital follow-up per JN's recs.

## 2016-02-01 NOTE — Telephone Encounter (Signed)
-----   Message from Javier Glazier, MD sent at 01/31/2016  7:15 AM EST ----- I saw Nicholas Cobb who has also seen in [place] in the trach clinic. He was seen in the ED after that appointment and I noticed that he has not been scheduled for a follow-up either in our clinic or with [facility] as I had planned in a couple of weeks. Can you look into make this happen. Thanks.

## 2016-02-05 ENCOUNTER — Encounter (HOSPITAL_COMMUNITY): Payer: Self-pay

## 2016-02-05 ENCOUNTER — Emergency Department (HOSPITAL_COMMUNITY): Payer: Medicare Other

## 2016-02-05 ENCOUNTER — Emergency Department (HOSPITAL_COMMUNITY)
Admission: EM | Admit: 2016-02-05 | Discharge: 2016-02-05 | Disposition: A | Discharge status: Home / Self Care | Payer: Medicare Other | Attending: Emergency Medicine | Admitting: Emergency Medicine

## 2016-02-05 DIAGNOSIS — Z8521 Personal history of malignant neoplasm of larynx: Secondary | ICD-10-CM | POA: Insufficient documentation

## 2016-02-05 DIAGNOSIS — Z87891 Personal history of nicotine dependence: Secondary | ICD-10-CM | POA: Diagnosis not present

## 2016-02-05 DIAGNOSIS — E119 Type 2 diabetes mellitus without complications: Secondary | ICD-10-CM | POA: Diagnosis not present

## 2016-02-05 DIAGNOSIS — J9509 Other tracheostomy complication: Secondary | ICD-10-CM | POA: Diagnosis not present

## 2016-02-05 DIAGNOSIS — Z930 Tracheostomy status: Secondary | ICD-10-CM | POA: Diagnosis not present

## 2016-02-05 DIAGNOSIS — R030 Elevated blood-pressure reading, without diagnosis of hypertension: Secondary | ICD-10-CM | POA: Diagnosis not present

## 2016-02-05 DIAGNOSIS — J9500 Unspecified tracheostomy complication: Secondary | ICD-10-CM | POA: Diagnosis not present

## 2016-02-05 DIAGNOSIS — J9503 Malfunction of tracheostomy stoma: Secondary | ICD-10-CM | POA: Diagnosis not present

## 2016-02-05 DIAGNOSIS — I10 Essential (primary) hypertension: Secondary | ICD-10-CM | POA: Insufficient documentation

## 2016-02-05 DIAGNOSIS — Z85818 Personal history of malignant neoplasm of other sites of lip, oral cavity, and pharynx: Secondary | ICD-10-CM | POA: Diagnosis not present

## 2016-02-05 DIAGNOSIS — Z430 Encounter for attention to tracheostomy: Secondary | ICD-10-CM

## 2016-02-05 NOTE — ED Notes (Signed)
Pt states that he only wants the critical care MD, or ENT MD to replace his Trach .

## 2016-02-05 NOTE — ED Provider Notes (Signed)
[facility] DEPT Provider Note   CSN: [id number] Arrival date & time: 02/05/16  0153   By signing my name below, I, Eunice Blase, attest that this documentation has been prepared under the direction and in the presence of No att. providers found. Electronically signed, Eunice Blase, ED Scribe. 02/05/16. 7:47 AM.   History   Chief Complaint Chief Complaint  Patient presents with  . Tracheostomy Tube Change   LEVEL 5 CAVEAT DUE TO Communication Barrier (tracheostomy)  The history is provided by the patient, medical records and the EMS personnel. The history is limited by the condition of the patient. No language interpreter was used.    HPI Comments: Nicholas Cobb is a 65 y.o. male who presents to the Emergency Department s/p a trachea tube dislodgement. Per EMS and pt notes, his trachea tube was clogged, it caused shortness of breath, and he took it out. Pt notes that he would either like a new trachea tube or he would like his current tube cleaned. Current trachea tube is shiley uncuffed extra long.He denies any recent illness, fever, chest pain.  Past Medical History:  Diagnosis Date  . Allergy   . Cancer ([place]) 1999   throat cancer  . Cervical spondylolysis 04/30/2011   severe  . Cervical spondylosis 03/25/2011  . Change in voice   . Chronic pain 03/25/2011  . Chronic sinusitis 04/27/2011  . Depression   . Difficulty urinating   . Emphysema 03/25/2011  . Essential hypertension, benign 12/03/2012  . GERD (gastroesophageal reflux disease) 03/25/2011  . Hearing loss   . Impaired glucose tolerance 04/27/2011  . 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 Rash   . Trouble swallowing   . Type II or unspecified type diabetes mellitus without mention of complication, uncontrolled 04/27/2011  . Ulcer ([place])   . UTI (lower urinary tract infection)   . Xerostomia 03/25/2011    Patient Active Problem List   Diagnosis Date Noted  . Hemoptysis 01/26/2016  . Throat cancer ([place])   .  Controlled diabetes mellitus type 2 with complications ([place])   . Chronic respiratory failure ([place]) 06/14/2015  . Respiratory failure ([place]) 06/13/2015  . Tracheostomy dependence ([place])   . Cellulitis and abscess of head   . Cellulitis of face   . Tracheostomy status ([place])   . Acute sinus infection 10/29/2013  . Rash 10/29/2013  . Hyponatremia 12/03/2012  . Essential hypertension 12/03/2012  . Cervical spondylolysis 04/30/2011  . Chronic sinusitis 04/27/2011  . Diabetes 04/27/2011  . Erectile dysfunction 04/19/2011  . Preventative health care 03/25/2011  . Cervical spondylosis 03/25/2011  . Emphysema 03/25/2011  . GERD (gastroesophageal reflux disease) 03/25/2011  . Chronic pain 03/25/2011  . 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 Xerostomia 03/25/2011  . Tracheostomy in place [facility]) 02/04/2011  . Laryngeal cancer ([place]) 02/04/2011  . Encounter for PEG (percutaneous endoscopic gastrostomy) ([place]) 02/04/2011  . Attention to G-tube [facility]) 11/07/2010    Past Surgical History:  Procedure Laterality Date  . GASTROSTOMY TUBE PLACEMENT    . PEG TUBE PLACEMENT  11/1999  . TRACHEOSTOMY  11/1999       Home Medications    Prior to Admission medications   Medication Sig Start Date End Date Taking? Authorizing Provider  AMBULATORY NON FORMULARY MEDICATION Trach(Shiley 6.0 XLT UP uncuffed), [place], [place] holders, Disposable inner cannulas 07/08/15   Gildardo Cranker, DO  betamethasone dipropionate 0.05 % lotion Apply topically 2 (two) times daily. To rash on leg Patient not taking: Reported on 01/30/2016  10/20/15   Gildardo Cranker, DO  ibuprofen (ADVIL,MOTRIN) 400 MG tablet take 1 tablet by mouth three times a day if needed for pain Patient taking differently: Take 400 mg by mouth three times daily as needed for pain 01/05/16   Gildardo Cranker, DO  losartan (COZAAR) 100 MG tablet take 1 tablet by mouth once daily Patient taking differently: Take 100 mg by mouth once daily 01/19/16    Gildardo Cranker, DO  naphazoline-pheniramine (NAPHCON-A) 0.025-0.3 % ophthalmic solution Place 2 drops into both eyes 4 (four) times daily as needed for irritation (red eye).    Historical Provider, MD  Nutritional Supplements (FEEDING SUPPLEMENT, JEVITY 1.2 CAL,) LIQD Place 237 mLs into feeding tube See admin instructions. Pt takes 2 cans at breakfast, 2 cans at lunch, 2 cans at dinner and 2 at night Patient not taking: Reported on 01/30/2016 12/02/14   Gildardo Cranker, DO  oxymetazoline (12 HOUR NASAL SPRAY) 0.05 % nasal spray Place 2 sprays into the nose 2 (two) times daily as needed for congestion.     Historical Provider, MD  predniSONE (DELTASONE) 50 MG tablet 1 tablet PO QD X4 days 01/30/16   Ripley Fraise, MD  ranitidine (ZANTAC) 150 MG tablet take 1 tablet by mouth twice a day Patient taking differently: Take 150 mg by mouth twice daily 11/26/15   [name], NP  triamcinolone cream (KENALOG) 0.1 % Apply 1 application topically 2 (two) times daily. Patient taking differently: Apply 1 application topically 2 (two) times daily as needed (rash).  07/02/15   Gildardo Cranker, DO    Family History Family History  Problem Relation Age of Onset  . Diabetes Mother   . Heart disease Father   . Stroke Other   . Diabetes Other   . Cancer Other     lung cancer    Social History Social History  Substance Use Topics  . Smoking status: Former Smoker    Packs/day: 1.00    Years: 23.00    Quit date: 11/06/1996  . Smokeless tobacco: Never Used  . Alcohol use No     Allergies   Oxybutynin chloride and Codeine   Review of Systems Review of Systems  Unable to perform ROS: Patient nonverbal    Physical Exam Updated Vital Signs BP 166/95 (BP Location: Left Arm)   Pulse 81   Temp 98.6 F (37 C) (Oral)   Resp 18   SpO2 94%   Physical Exam  Constitutional: He appears well-developed and well-nourished.  HENT:  Head: Normocephalic.  Chronic appearing swelling of the right  mandibular region.  Neck:  Stoma in the anterior neck with mucoid secretions  Cardiovascular: Normal rate and regular rhythm.   No murmur heard. Pulmonary/Chest: Effort normal and breath sounds normal. No respiratory distress.  Abdominal: Soft. There is no tenderness. There is no rebound and no guarding.  Musculoskeletal: He exhibits no edema or tenderness.  Neurological: He is alert.  Moves all extremities symmetrically  Skin: Skin is warm and dry.  Psychiatric: He has a normal mood and affect. His behavior is normal.  Nursing note and vitals reviewed.    ED Treatments / Results  DIAGNOSTIC STUDIES: Oxygen Saturation is 99% on RA, normal by my interpretation.    COORDINATION OF CARE: 7:47 AM Discussed treatment plan with pt at bedside and pt agreed to plan.  Labs (all labs ordered are listed, but only abnormal results are displayed) Labs Reviewed - No data to display  EKG  EKG Interpretation None  Radiology Dg Chest Port 1 View  Result Date: 02/05/2016 CLINICAL DATA:  Tracheostomy replacement EXAM: PORTABLE CHEST 1 VIEW COMPARISON:  Chest radiograph 01/30/2016 FINDINGS: The tip of the tracheostomy tube is approximately 3.5 cm above the inferior margin of the carina. There is shallow lung inflation with bibasilar atelectasis. No focal airspace consolidation or pulmonary edema. IMPRESSION: Tracheostomy tube tip 3.5 cm above the inferior margin of the carina. Electronically Signed   By: Ulyses Jarred M.D.   On: 02/05/2016 05:26    Procedures Procedures (including critical care time)  Medications Ordered in ED Medications - No data to display   Initial Impression / Assessment and Plan / ED Course  I have reviewed the triage vital signs and the nursing notes.  Pertinent labs & imaging results that were available during my care of the patient were reviewed by me and considered in my medical decision making (see chart for details).  Clinical Course     Patient  with history of tracheostomy here after the tracheostomy tube came out due to clogging. He has a history of difficult replacements in the past and refuses placement by EDP. Pulmonary critical care consulted for replacements and was replaced at bedside by pulmonary service. Postplacement chest x-ray with no acute disease. Post procedure patient with no respiratory distress, breathing comfortable on room air. Plan to DC home with outpatient pulmonary and ENT follow-up.  Final Clinical Impressions(s) / ED Diagnoses   Final diagnoses:  Tracheostomy malfunction [facility])    New Prescriptions Discharge Medication List as of 02/05/2016  5:30 AM    I personally performed the services described in this documentation, which was scribed in my presence. The recorded information has been reviewed and is accurate.    Quintella Reichert, MD 02/05/16 [phone]

## 2016-02-05 NOTE — ED Notes (Signed)
Bed: RESB Expected date:  Expected time:  Means of arrival:  Comments: Trach dislodged

## 2016-02-05 NOTE — ED Triage Notes (Signed)
Pt brought in by EMS from home reports that pt Nicholas Cobb was clogged and he was trying to unclog it and it came out. Pt appers comfortable  And without distress

## 2016-02-05 NOTE — Procedures (Addendum)
Trach change.  Called by ED for trach change. Pt is well know to our service with chronic trach. reccurent occlusions requiring trach changes. He took the trach out today since it was occluded and could not put put it back in He insisted on either ENT or PCCM to do the trach change  Blood pressure 130/79, pulse 75, temperature 98.6 F (37 C), temperature source Oral, resp. rate 22, SpO2 95 %. Awake, alert, oriented CVS-RRR RS-Clear Abd- Soft, +BS Ext- No edema  New 6 proximal XLT cuffless inserted over bougie without issue. Lurline Idol is in good place. Colour change noted over the ET indicator Suctioned by RT. CXR pending  Marshell Garfinkel MD [facility] Pager [phone] If no answer or after 3pm call: [phone] 02/05/2016, 4:15 AM

## 2016-02-10 ENCOUNTER — Telehealth: Payer: Self-pay | Admitting: Pulmonary Disease

## 2016-02-10 NOTE — Telephone Encounter (Signed)
Baxter Flattery from resp calling back stating that the only dated they have for jan for a trach is 1/10@ 12 @ she can be reached @ [place].Hillery Hunter

## 2016-02-10 NOTE — Telephone Encounter (Signed)
Spoke with pt's daughter and advised that we will get pt an appt with Trach clinic and call them with appt time.  She verbalized understanding. [place] x 1 for trach clinic

## 2016-02-10 NOTE — Telephone Encounter (Signed)
Called and spoke with pts daughter [name] and she is aware of appt with trach clinic on 1/10 at 45.  lmom to have tara call back to confirm this appt for the pt.

## 2016-02-14 DIAGNOSIS — Z923 Personal history of irradiation: Secondary | ICD-10-CM | POA: Diagnosis not present

## 2016-02-14 DIAGNOSIS — J0100 Acute maxillary sinusitis, unspecified: Secondary | ICD-10-CM | POA: Diagnosis not present

## 2016-02-14 DIAGNOSIS — R0602 Shortness of breath: Secondary | ICD-10-CM | POA: Diagnosis not present

## 2016-02-14 DIAGNOSIS — K119 Disease of salivary gland, unspecified: Secondary | ICD-10-CM | POA: Diagnosis not present

## 2016-02-14 DIAGNOSIS — J9509 Other tracheostomy complication: Secondary | ICD-10-CM | POA: Diagnosis not present

## 2016-02-14 DIAGNOSIS — Z9889 Other specified postprocedural states: Secondary | ICD-10-CM | POA: Diagnosis not present

## 2016-02-14 DIAGNOSIS — R609 Edema, unspecified: Secondary | ICD-10-CM | POA: Diagnosis not present

## 2016-02-14 DIAGNOSIS — J0130 Acute sphenoidal sinusitis, unspecified: Secondary | ICD-10-CM | POA: Diagnosis not present

## 2016-02-14 DIAGNOSIS — R220 Localized swelling, mass and lump, head: Secondary | ICD-10-CM | POA: Diagnosis not present

## 2016-02-14 DIAGNOSIS — Z79899 Other long term (current) drug therapy: Secondary | ICD-10-CM | POA: Diagnosis not present

## 2016-02-14 DIAGNOSIS — Z930 Tracheostomy status: Secondary | ICD-10-CM | POA: Diagnosis not present

## 2016-02-14 DIAGNOSIS — J9501 Hemorrhage from tracheostomy stoma: Secondary | ICD-10-CM | POA: Diagnosis not present

## 2016-02-14 DIAGNOSIS — Z8521 Personal history of malignant neoplasm of larynx: Secondary | ICD-10-CM | POA: Diagnosis not present

## 2016-02-14 DIAGNOSIS — Z87891 Personal history of nicotine dependence: Secondary | ICD-10-CM | POA: Diagnosis not present

## 2016-02-14 DIAGNOSIS — Z931 Gastrostomy status: Secondary | ICD-10-CM | POA: Diagnosis not present

## 2016-02-14 DIAGNOSIS — R918 Other nonspecific abnormal finding of lung field: Secondary | ICD-10-CM | POA: Diagnosis not present

## 2016-02-14 DIAGNOSIS — D110 Benign neoplasm of parotid gland: Secondary | ICD-10-CM | POA: Diagnosis not present

## 2016-02-14 DIAGNOSIS — J329 Chronic sinusitis, unspecified: Secondary | ICD-10-CM | POA: Diagnosis not present

## 2016-02-14 DIAGNOSIS — M8660 Other chronic osteomyelitis, unspecified site: Secondary | ICD-10-CM | POA: Diagnosis not present

## 2016-02-14 DIAGNOSIS — C154 Malignant neoplasm of middle third of esophagus: Secondary | ICD-10-CM | POA: Diagnosis not present

## 2016-02-14 DIAGNOSIS — I10 Essential (primary) hypertension: Secondary | ICD-10-CM | POA: Diagnosis not present

## 2016-02-14 DIAGNOSIS — J320 Chronic maxillary sinusitis: Secondary | ICD-10-CM | POA: Diagnosis not present

## 2016-02-14 DIAGNOSIS — J019 Acute sinusitis, unspecified: Secondary | ICD-10-CM | POA: Diagnosis not present

## 2016-02-14 DIAGNOSIS — J189 Pneumonia, unspecified organism: Secondary | ICD-10-CM | POA: Diagnosis not present

## 2016-02-14 DIAGNOSIS — E871 Hypo-osmolality and hyponatremia: Secondary | ICD-10-CM | POA: Diagnosis not present

## 2016-02-14 DIAGNOSIS — K111 Hypertrophy of salivary gland: Secondary | ICD-10-CM | POA: Diagnosis not present

## 2016-02-14 DIAGNOSIS — Z886 Allergy status to analgesic agent status: Secondary | ICD-10-CM | POA: Diagnosis not present

## 2016-02-14 DIAGNOSIS — R042 Hemoptysis: Secondary | ICD-10-CM | POA: Diagnosis not present

## 2016-02-14 DIAGNOSIS — K219 Gastro-esophageal reflux disease without esophagitis: Secondary | ICD-10-CM | POA: Diagnosis not present

## 2016-02-14 DIAGNOSIS — Z823 Family history of stroke: Secondary | ICD-10-CM | POA: Diagnosis not present

## 2016-02-14 DIAGNOSIS — J439 Emphysema, unspecified: Secondary | ICD-10-CM | POA: Diagnosis not present

## 2016-02-14 DIAGNOSIS — J432 Centrilobular emphysema: Secondary | ICD-10-CM | POA: Diagnosis not present

## 2016-02-14 DIAGNOSIS — H9319 Tinnitus, unspecified ear: Secondary | ICD-10-CM | POA: Diagnosis not present

## 2016-02-14 DIAGNOSIS — Z833 Family history of diabetes mellitus: Secondary | ICD-10-CM | POA: Diagnosis not present

## 2016-02-14 DIAGNOSIS — C153 Malignant neoplasm of upper third of esophagus: Secondary | ICD-10-CM | POA: Diagnosis not present

## 2016-02-15 NOTE — Telephone Encounter (Signed)
Called Baxter Flattery and appt confirmed 1/10 at 12 noon  Pt's daughter already aware

## 2016-02-18 ENCOUNTER — Ambulatory Visit: Payer: Medicare Other | Admitting: Internal Medicine

## 2016-02-19 DIAGNOSIS — C153 Malignant neoplasm of upper third of esophagus: Secondary | ICD-10-CM | POA: Diagnosis not present

## 2016-02-19 DIAGNOSIS — C154 Malignant neoplasm of middle third of esophagus: Secondary | ICD-10-CM | POA: Diagnosis not present

## 2016-02-19 DIAGNOSIS — H9319 Tinnitus, unspecified ear: Secondary | ICD-10-CM | POA: Diagnosis not present

## 2016-02-23 ENCOUNTER — Ambulatory Visit (HOSPITAL_COMMUNITY)
Admission: RE | Admit: 2016-02-23 | Discharge: 2016-02-23 | Disposition: A | Discharge status: Home / Self Care | Payer: Medicare Other | Source: Ambulatory Visit | Attending: Acute Care | Admitting: Acute Care

## 2016-02-23 DIAGNOSIS — Z430 Encounter for attention to tracheostomy: Secondary | ICD-10-CM | POA: Insufficient documentation

## 2016-02-23 DIAGNOSIS — Z930 Tracheostomy status: Secondary | ICD-10-CM

## 2016-02-23 NOTE — Progress Notes (Signed)
Tracheostomy Procedure Note  Nicholas Cobb [id number] [birth year]  Pre Procedure Tracheostomy Information  Trach Brand: Shiley Size: 6.0 Style: Proximal and Uncuffed Secured by: Velcro   Procedure: trach change    Post Procedure Tracheostomy Information  Trach Brand: Shiley Size: 6.0 Style: Proximal and Uncuffed Secured by: Velcro   Post Procedure Evaluation:  ETCO2 positive color change from yellow to purple : Yes.   Vital signs: pulse 79, respirations 20 and pulse oximetry 97 % Patients current condition: stable Complications: No apparent complications Trach site exam: clean Wound care done: 4 x 4 gauze Patient did tolerate procedure well.   Education: None   Prescription needs: none    Additional needs: none

## 2016-02-23 NOTE — Progress Notes (Signed)
   Subjective:  ROV visit   Patient ID: Nicholas Cobb, male    DOB: [birth year], 66 y.o.   MRN: [id number]  HPI  Airam returns for routine trach change. Since our last visit he had one visit to the ER due to plugged trach which resulted in trach coming out and needing emergent placement. He tells me that this was a little traumatic and then resulted in an overnight visit to the [facility] ER for bleeding. He has also been seen by ENT at [facility] as well as HB team at [facility]. They felt that he no longer would benefit from HB therapy. His most recent imaging does not suggest any new changes.   Review of Systems  Constitutional: Negative.   HENT: Negative.   Eyes: Negative.   Respiratory: Negative.   Cardiovascular: Negative.   Gastrointestinal: Negative.   Endocrine: Negative.   Musculoskeletal: Negative.   Neurological: Negative.         pulse 79, respirations 20 and pulse oximetry 97 % Objective:   Physical Exam  Constitutional: He is oriented to person, place, and time. He appears well-developed and well-nourished.  HENT:  Chronic right jaw, facial swelling/edema unchanged. His trach is set back, difficult to fully assess stoma but appears unremarkable.   Eyes: Conjunctivae and EOM are normal. Pupils are equal, round, and reactive to light.  Neck: Normal range of motion.  Cardiovascular: Normal rate, regular rhythm and normal heart sounds.   Pulmonary/Chest: Effort normal and breath sounds normal.  Abdominal: Soft. Bowel sounds are normal.  Musculoskeletal: Normal range of motion. He exhibits no edema, tenderness or deformity.  Neurological: He is alert and oriented to person, place, and time.  Skin: Skin is warm and dry.  Psychiatric: He has a normal mood and affect.   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

## 2016-02-24 ENCOUNTER — Encounter (HOSPITAL_COMMUNITY): Payer: Self-pay

## 2016-02-24 ENCOUNTER — Emergency Department (HOSPITAL_COMMUNITY)
Admission: EM | Admit: 2016-02-24 | Discharge: 2016-02-25 | Disposition: A | Discharge status: Home / Self Care | Payer: Medicare Other | Attending: Emergency Medicine | Admitting: Emergency Medicine

## 2016-02-24 DIAGNOSIS — Z85818 Personal history of malignant neoplasm of other sites of lip, oral cavity, and pharynx: Secondary | ICD-10-CM | POA: Diagnosis not present

## 2016-02-24 DIAGNOSIS — Z430 Encounter for attention to tracheostomy: Secondary | ICD-10-CM | POA: Diagnosis not present

## 2016-02-24 DIAGNOSIS — Z79899 Other long term (current) drug therapy: Secondary | ICD-10-CM | POA: Insufficient documentation

## 2016-02-24 DIAGNOSIS — J9503 Malfunction of tracheostomy stoma: Secondary | ICD-10-CM | POA: Insufficient documentation

## 2016-02-24 DIAGNOSIS — E119 Type 2 diabetes mellitus without complications: Secondary | ICD-10-CM | POA: Diagnosis not present

## 2016-02-24 DIAGNOSIS — R0902 Hypoxemia: Secondary | ICD-10-CM | POA: Diagnosis not present

## 2016-02-24 DIAGNOSIS — Z87891 Personal history of nicotine dependence: Secondary | ICD-10-CM | POA: Insufficient documentation

## 2016-02-24 DIAGNOSIS — Z930 Tracheostomy status: Secondary | ICD-10-CM | POA: Diagnosis not present

## 2016-02-24 DIAGNOSIS — Z4682 Encounter for fitting and adjustment of non-vascular catheter: Secondary | ICD-10-CM | POA: Diagnosis not present

## 2016-02-24 DIAGNOSIS — I10 Essential (primary) hypertension: Secondary | ICD-10-CM | POA: Insufficient documentation

## 2016-02-24 NOTE — ED Triage Notes (Signed)
Pt reports his trach became hooked on the mask and when he took the mask off he trach came out. O2 sats 99% on RA, RR-25 breaths per minute. Pt not speaking but is able to write down what he would like to say.

## 2016-02-24 NOTE — ED Provider Notes (Signed)
[facility] DEPT Provider Note   CSN: [id number] Arrival date & time: 02/24/16  2248   By signing my name below, I, Gruver, attest that this documentation has been prepared under the direction and in the presence of Everlene Balls, MD . Electronically Signed: Evelene Croon, Scribe. 02/24/2016. 11:46 PM.   History   Chief Complaint Chief Complaint  Patient presents with  . Tracheostomy Tube Change    HPI AMERICO VALLERY is a 66 y.o. male who presents to the ED stating that he accidentally pulled out his tracheostomy tube this evening after removing his face mask. He presents to this facility for it to be replaced. Pt had this trach placed yesterday at the trach clinic. He has no other complaints at this time. No modifying factors.   The history is provided by the patient. No language interpreter was used.    Past Medical History:  Diagnosis Date  . Allergy   . Cancer ([place]) 1999   throat cancer  . Cervical spondylolysis 04/30/2011   severe  . Cervical spondylosis 03/25/2011  . Change in voice   . Chronic pain 03/25/2011  . Chronic sinusitis 04/27/2011  . Depression   . Difficulty urinating   . Emphysema 03/25/2011  . Essential hypertension, benign 12/03/2012  . GERD (gastroesophageal reflux disease) 03/25/2011  . Hearing loss   . Impaired glucose tolerance 04/27/2011  . 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 Rash   . Trouble swallowing   . Type II or unspecified type diabetes mellitus without mention of complication, uncontrolled 04/27/2011  . Ulcer ([place])   . UTI (lower urinary tract infection)   . Xerostomia 03/25/2011    Patient Active Problem List   Diagnosis Date Noted  . Tracheostomy care ([place])   . Hypoxemia   . Hemoptysis 01/26/2016  . Throat cancer ([place])   . Controlled diabetes mellitus type 2 with complications ([place])   . Chronic respiratory failure ([place]) 06/14/2015  . Respiratory failure ([place]) 06/13/2015  . Tracheostomy dependence ([place])     . Cellulitis and abscess of head   . Cellulitis of face   . Tracheostomy status ([place])   . Acute sinus infection 10/29/2013  . Rash 10/29/2013  . Hyponatremia 12/03/2012  . Essential hypertension 12/03/2012  . Cervical spondylolysis 04/30/2011  . Chronic sinusitis 04/27/2011  . Diabetes 04/27/2011  . Erectile dysfunction 04/19/2011  . Preventative health care 03/25/2011  . Cervical spondylosis 03/25/2011  . Emphysema 03/25/2011  . GERD (gastroesophageal reflux disease) 03/25/2011  . Chronic pain 03/25/2011  . 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 Xerostomia 03/25/2011  . Tracheostomy in place [facility]) 02/04/2011  . Laryngeal cancer ([place]) 02/04/2011  . Encounter for PEG (percutaneous endoscopic gastrostomy) ([place]) 02/04/2011  . Attention to G-tube [facility]) 11/07/2010    Past Surgical History:  Procedure Laterality Date  . GASTROSTOMY TUBE PLACEMENT    . PEG TUBE PLACEMENT  11/1999  . TRACHEOSTOMY  11/1999       Home Medications    Prior to Admission medications   Medication Sig Start Date End Date Taking? Authorizing Provider  AMBULATORY NON FORMULARY MEDICATION Trach(Shiley 6.0 XLT UP uncuffed), [place], [place] holders, Disposable inner cannulas 07/08/15   Gildardo Cranker, DO  betamethasone dipropionate 0.05 % lotion Apply topically 2 (two) times daily. To rash on leg Patient not taking: Reported on 01/30/2016 10/20/15   Gildardo Cranker, DO  ibuprofen (ADVIL,MOTRIN) 400 MG tablet take 1 tablet by mouth three times a day if needed for  pain Patient taking differently: Take 400 mg by mouth three times daily as needed for pain 01/05/16   Gildardo Cranker, DO  losartan (COZAAR) 100 MG tablet take 1 tablet by mouth once daily Patient taking differently: Take 100 mg by mouth once daily 01/19/16   Gildardo Cranker, DO  naphazoline-pheniramine (NAPHCON-A) 0.025-0.3 % ophthalmic solution Place 2 drops into both eyes 4 (four) times daily as needed for irritation (red eye).    Historical  Provider, MD  Nutritional Supplements (FEEDING SUPPLEMENT, JEVITY 1.2 CAL,) LIQD Place 237 mLs into feeding tube See admin instructions. Pt takes 2 cans at breakfast, 2 cans at lunch, 2 cans at dinner and 2 at night Patient not taking: Reported on 01/30/2016 12/02/14   Gildardo Cranker, DO  oxymetazoline (12 HOUR NASAL SPRAY) 0.05 % nasal spray Place 2 sprays into the nose 2 (two) times daily as needed for congestion.     Historical Provider, MD  predniSONE (DELTASONE) 50 MG tablet 1 tablet PO QD X4 days 01/30/16   Ripley Fraise, MD  ranitidine (ZANTAC) 150 MG tablet take 1 tablet by mouth twice a day Patient taking differently: Take 150 mg by mouth twice daily 11/26/15   [name], NP  triamcinolone cream (KENALOG) 0.1 % Apply 1 application topically 2 (two) times daily. Patient taking differently: Apply 1 application topically 2 (two) times daily as needed (rash).  07/02/15   Gildardo Cranker, DO    Family History Family History  Problem Relation Age of Onset  . Diabetes Mother   . Heart disease Father   . Stroke Other   . Diabetes Other   . Cancer Other     lung cancer    Social History Social History  Substance Use Topics  . Smoking status: Former Smoker    Packs/day: 1.00    Years: 23.00    Quit date: 11/06/1996  . Smokeless tobacco: Never Used  . Alcohol use No     Allergies   Oxybutynin chloride and Codeine   Review of Systems Review of Systems  10 systems reviewed and all are negative for acute change except as noted in the HPI.   Physical Exam Updated Vital Signs BP (!) 178/107   Pulse 89   Resp 23   SpO2 95%   Physical Exam  Constitutional: He is oriented to person, place, and time. Vital signs are normal. He appears well-developed and well-nourished.  Non-toxic appearance. He does not appear ill. No distress.  HENT:  Head: Normocephalic and atraumatic.  Nose: Nose normal.  Mouth/Throat: Oropharynx is clear and moist. No oropharyngeal exudate.    Eyes: Conjunctivae and EOM are normal. Pupils are equal, round, and reactive to light. No scleral icterus.  Neck: Normal range of motion. Neck supple. No tracheal deviation, no edema, no erythema and normal range of motion present. No thyroid mass and no thyromegaly present.  Cardiovascular: Normal rate, regular rhythm, S1 normal, S2 normal, normal heart sounds, intact distal pulses and normal pulses.  Exam reveals no gallop and no friction rub.   No murmur heard. 2+ bilateral pitting edema  Pulmonary/Chest: Effort normal and breath sounds normal. No respiratory distress. He has no wheezes. He has no rhonchi. He has no rales.  There is a tracheostromy site without trach in place.   Abdominal: Soft. Normal appearance and bowel sounds are normal. He exhibits no distension, no ascites and no mass. There is no hepatosplenomegaly. There is no tenderness. There is no rebound, no guarding and no CVA tenderness.  PEG tube in place in the RUQ  Musculoskeletal: Normal range of motion. He exhibits edema. He exhibits no tenderness.  Lymphadenopathy:    He has no cervical adenopathy.  Neurological: He is alert and oriented to person, place, and time. He has normal strength. No cranial nerve deficit or sensory deficit.  Skin: Skin is warm, dry and intact. No petechiae and no rash noted. He is not diaphoretic. No erythema. No pallor.  Nursing note and vitals reviewed.    ED Treatments / Results  DIAGNOSTIC STUDIES:  Oxygen Saturation is 95% on RA, adequate by my interpretation.    COORDINATION OF CARE:  11:30 PM Discussed treatment plan with pt at bedside and pt agreed to plan.  Labs (all labs ordered are listed, but only abnormal results are displayed) Labs Reviewed - No data to display  EKG  EKG Interpretation None       Radiology Dg Chest [facility] 1 View  Result Date: 02/25/2016 CLINICAL DATA:  New tracheal tube placed. EXAM: PORTABLE CHEST 1 VIEW COMPARISON:  02/05/2016 FINDINGS:  Endotracheal tube present with tip measuring 4 cm above the carina. Shallow inspiration. Heart size and pulmonary vascularity are normal. Lungs are clear and expanded. Calcification of the aorta. Postoperative changes in the right mandible. IMPRESSION: Endotracheal tube appears in satisfactory position. Shallow inspiration. No evidence of active pulmonary disease. Electronically Signed   By: Lucienne Capers M.D.   On: 02/25/2016 05:28    Procedures Procedures (including critical care time)  Medications Ordered in ED Medications - No data to display   Initial Impression / Assessment and Plan / ED Course  I have reviewed the triage vital signs and the nursing notes.  Pertinent labs & imaging results that were available during my care of the patient were reviewed by me and considered in my medical decision making (see chart for details).  Clinical Course    Patient presents to the ED for trach change.  We have tried multiple times to reassure him that we can change his trach but he is out right refusing.  States that his care team told him that only CC can change the trach if he comes to the ED.   12:00 AM Discussed case with Dr. Chase Caller with critical care who will send someone from his team to evaluate the trach site.    6:05 AM CXR confirms trach in good position. I appreciate CC help in this situation.  Patient will be DC'ed in good condition with PCP fu.  Final Clinical Impressions(s) / ED Diagnoses   Final diagnoses:  Tracheostomy malfunction [facility])    New Prescriptions New Prescriptions   No medications on file      I personally performed the services described in this documentation, which was scribed in my presence. The recorded information has been reviewed and is accurate.       Everlene Balls, MD 02/25/16 [phone]

## 2016-02-24 NOTE — Progress Notes (Signed)
RT note:  Patient seen reference to dislodged tracheostomy.  On exam he is in no immediate distress.  RR stable with good air movement through stoma.  SpO2 94% RA.  Patient is able to write notes and states that he accidentally pulled the trach out while moving his trach collar.  Additionally, he states that he had a previously complication from insertion of the trach and was informed by PCCM that they are the only ones to replace the trach if if becomes dislodged again.  #6 XLT proximal cuffless with patient.  Trach cleaned.  Order sent to central supply to try and obtain a new trach.  PCCM to be consulted.  Patient set up on a 28% ATC.  RT will continue to monitor.

## 2016-02-25 ENCOUNTER — Emergency Department (HOSPITAL_COMMUNITY)
Admission: EM | Admit: 2016-02-25 | Discharge: 2016-02-25 | Disposition: A | Discharge status: Home / Self Care | Payer: Medicare Other | Source: Home / Self Care | Attending: Emergency Medicine | Admitting: Emergency Medicine

## 2016-02-25 ENCOUNTER — Encounter (HOSPITAL_COMMUNITY): Payer: Self-pay | Admitting: *Deleted

## 2016-02-25 ENCOUNTER — Emergency Department (HOSPITAL_COMMUNITY): Payer: Medicare Other

## 2016-02-25 DIAGNOSIS — Z430 Encounter for attention to tracheostomy: Secondary | ICD-10-CM

## 2016-02-25 DIAGNOSIS — Z8521 Personal history of malignant neoplasm of larynx: Secondary | ICD-10-CM | POA: Insufficient documentation

## 2016-02-25 DIAGNOSIS — Z930 Tracheostomy status: Secondary | ICD-10-CM

## 2016-02-25 DIAGNOSIS — I10 Essential (primary) hypertension: Secondary | ICD-10-CM

## 2016-02-25 DIAGNOSIS — E119 Type 2 diabetes mellitus without complications: Secondary | ICD-10-CM

## 2016-02-25 DIAGNOSIS — Z87891 Personal history of nicotine dependence: Secondary | ICD-10-CM | POA: Insufficient documentation

## 2016-02-25 DIAGNOSIS — Z85818 Personal history of malignant neoplasm of other sites of lip, oral cavity, and pharynx: Secondary | ICD-10-CM

## 2016-02-25 DIAGNOSIS — R0902 Hypoxemia: Secondary | ICD-10-CM | POA: Diagnosis not present

## 2016-02-25 DIAGNOSIS — Z4682 Encounter for fitting and adjustment of non-vascular catheter: Secondary | ICD-10-CM | POA: Diagnosis not present

## 2016-02-25 NOTE — Procedures (Signed)
Trach replacement  Patient dislodged tracheostomy, unable to replace it bedside.  Tube changer placed.  Size 6 cuffless distal XLT lubricated and placed over the changer.  Good air movement bilaterally.  Good color change.  CXR ordered and pending.  Rush Farmer, M.D. [facility] Pulmonary/Critical Care Medicine. Pager: [phone]. After hours pager: [phone].

## 2016-02-25 NOTE — Progress Notes (Signed)
RT note:  Assisted Dr. Nelda Marseille with replacement of tracheostomy.  Tube exchanger utilized.  Positive color change on capnography post procedure.  BBS clear and equal.  Patient admits to pain at site, but denies difficulty breathing through trach.  Suction returned moderate amount of thick, bloody secretions.  Pt spontaneously expectorated a large mucus plug.

## 2016-02-25 NOTE — Progress Notes (Signed)
Pt complaining trach ties are bloody and "does not want to go out in public looking like that". Lurline Idol ties changed with no problem. Pt reports it feels better. Pt requested [name] NP CCM be notified. NP called and informed, will be down to see him when free. Pt informed. RN at bedside. RT will continue to monitor.

## 2016-02-25 NOTE — ED Triage Notes (Signed)
Pt shows handwritten note stating his trach collar is bloody and needs to be changed.  RT at pt's side and will change collar.  Pt denies denies any sob or any other s/s.

## 2016-02-25 NOTE — ED Provider Notes (Signed)
[facility] DEPT Provider Note    By signing my name below, I, Bea Graff, attest that this documentation has been prepared under the direction and in the presence of Harlene Ramus, PA-C. Electronically Signed: Bea Graff, ED Scribe. 02/25/16. 2:09 PM.   History   Chief Complaint Chief Complaint  Patient presents with  . Tracheostomy Tube Change    The history is provided by the patient and medical records. No language interpreter was used.    Nicholas Cobb is a 66 y.o. male with PMHx of throat cancer who presents to the Emergency Department needing his tracheostomy tube collar changed. He reports associated bleeding around the tracheostomy tube that got on the collar secondary to having it changed here last night. He has not done anything to treat the complaint. Respiratory therapy saw patient in triage and changed the collar for him. Pt denies modifying factors. He denies fever, chills, nausea, vomiting, SOB, CP, bleeding. Pt reports he was told by respiratory tech that his "trach doctor" want to see him to evaluate his new trach.   Past Medical History:  Diagnosis Date  . Allergy   . Cancer ([place]) 1999   throat cancer  . Cervical spondylolysis 04/30/2011   severe  . Cervical spondylosis 03/25/2011  . Change in voice   . Chronic pain 03/25/2011  . Chronic sinusitis 04/27/2011  . Depression   . Difficulty urinating   . Emphysema 03/25/2011  . Essential hypertension, benign 12/03/2012  . GERD (gastroesophageal reflux disease) 03/25/2011  . Hearing loss   . Impaired glucose tolerance 04/27/2011  . 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 Rash   . Trouble swallowing   . Type II or unspecified type diabetes mellitus without mention of complication, uncontrolled 04/27/2011  . Ulcer ([place])   . UTI (lower urinary tract infection)   . Xerostomia 03/25/2011    Patient Active Problem List   Diagnosis Date Noted  . Tracheostomy care ([place])   . Hypoxemia   . Hemoptysis  01/26/2016  . Throat cancer ([place])   . Controlled diabetes mellitus type 2 with complications ([place])   . Chronic respiratory failure ([place]) 06/14/2015  . Respiratory failure ([place]) 06/13/2015  . Tracheostomy dependence ([place])   . Cellulitis and abscess of head   . Cellulitis of face   . Tracheostomy status ([place])   . Acute sinus infection 10/29/2013  . Rash 10/29/2013  . Hyponatremia 12/03/2012  . Essential hypertension 12/03/2012  . Cervical spondylolysis 04/30/2011  . Chronic sinusitis 04/27/2011  . Diabetes 04/27/2011  . Erectile dysfunction 04/19/2011  . Preventative health care 03/25/2011  . Cervical spondylosis 03/25/2011  . Emphysema 03/25/2011  . GERD (gastroesophageal reflux disease) 03/25/2011  . Chronic pain 03/25/2011  . 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 Xerostomia 03/25/2011  . Tracheostomy in place [facility]) 02/04/2011  . Laryngeal cancer ([place]) 02/04/2011  . Encounter for PEG (percutaneous endoscopic gastrostomy) ([place]) 02/04/2011  . Attention to G-tube [facility]) 11/07/2010    Past Surgical History:  Procedure Laterality Date  . GASTROSTOMY TUBE PLACEMENT    . PEG TUBE PLACEMENT  11/1999  . TRACHEOSTOMY  11/1999       Home Medications    Prior to Admission medications   Medication Sig Start Date End Date Taking? Authorizing Provider  AMBULATORY NON FORMULARY MEDICATION Trach(Shiley 6.0 XLT UP uncuffed), [place], [place] holders, Disposable inner cannulas 07/08/15   Gildardo Cranker, DO  betamethasone dipropionate 0.05 % lotion Apply topically 2 (two) times daily. To rash on leg Patient  not taking: Reported on 01/30/2016 10/20/15   Gildardo Cranker, DO  ibuprofen (ADVIL,MOTRIN) 400 MG tablet take 1 tablet by mouth three times a day if needed for pain Patient taking differently: Take 400 mg by mouth three times daily as needed for pain 01/05/16   Gildardo Cranker, DO  losartan (COZAAR) 100 MG tablet take 1 tablet by mouth once daily Patient taking differently: Take  100 mg by mouth once daily 01/19/16   Gildardo Cranker, DO  naphazoline-pheniramine (NAPHCON-A) 0.025-0.3 % ophthalmic solution Place 2 drops into both eyes 4 (four) times daily as needed for irritation (red eye).    Historical Provider, MD  Nutritional Supplements (FEEDING SUPPLEMENT, JEVITY 1.2 CAL,) LIQD Place 237 mLs into feeding tube See admin instructions. Pt takes 2 cans at breakfast, 2 cans at lunch, 2 cans at dinner and 2 at night Patient not taking: Reported on 01/30/2016 12/02/14   Gildardo Cranker, DO  oxymetazoline (12 HOUR NASAL SPRAY) 0.05 % nasal spray Place 2 sprays into the nose 2 (two) times daily as needed for congestion.     Historical Provider, MD  predniSONE (DELTASONE) 50 MG tablet 1 tablet PO QD X4 days 01/30/16   Ripley Fraise, MD  ranitidine (ZANTAC) 150 MG tablet take 1 tablet by mouth twice a day Patient taking differently: Take 150 mg by mouth twice daily 11/26/15   [name], NP  triamcinolone cream (KENALOG) 0.1 % Apply 1 application topically 2 (two) times daily. Patient taking differently: Apply 1 application topically 2 (two) times daily as needed (rash).  07/02/15   Gildardo Cranker, DO    Family History Family History  Problem Relation Age of Onset  . Diabetes Mother   . Heart disease Father   . Stroke Other   . Diabetes Other   . Cancer Other     lung cancer    Social History Social History  Substance Use Topics  . Smoking status: Former Smoker    Packs/day: 1.00    Years: 23.00    Quit date: 11/06/1996  . Smokeless tobacco: Never Used  . Alcohol use No     Allergies   Oxybutynin chloride and Codeine   Review of Systems Review of Systems  Constitutional: Negative for chills and fever.  Respiratory: Negative for shortness of breath.   Cardiovascular: Negative for chest pain.  Gastrointestinal: Negative for nausea and vomiting.  Skin: Positive for wound.     Physical Exam Updated Vital Signs BP 129/67 (BP Location: Right Arm)    Pulse 98   Temp 98.3 F (36.8 C) (Oral)   Resp 22   Ht '5\' 9"'$  (1.753 m)   Wt 169 lb (76.7 kg)   SpO2 99%   BMI 24.96 kg/m   Physical Exam  Constitutional: He is oriented to person, place, and time. He appears well-developed and well-nourished.  HENT:  Head: Normocephalic and atraumatic.  Trach present.  Eyes: Conjunctivae and EOM are normal. Right eye exhibits no discharge. Left eye exhibits no discharge. No scleral icterus.  Cardiovascular: Normal rate, regular rhythm, normal heart sounds and intact distal pulses.   Pulmonary/Chest: Effort normal and breath sounds normal.  Pt without signs of respiratory distress.  Abdominal: Soft. Bowel sounds are normal. There is no tenderness.  PEG tube in place in RUQ.  Musculoskeletal: He exhibits no edema.  Neurological: He is alert and oriented to person, place, and time.  Skin: Skin is warm and dry.  Nursing note and vitals reviewed.    ED Treatments /  Results  DIAGNOSTIC STUDIES: Oxygen Saturation is 99% on RA, normal by my interpretation.   COORDINATION OF CARE: 1:30 PM- Critical care NP contacted while pt in triage. Will contact critical care team again so pt can be evaluated by them prior to discharge. Pt verbalizes understanding and agrees to plan.  Medications - No data to display  Labs (all labs ordered are listed, but only abnormal results are displayed) Labs Reviewed - No data to display  EKG  EKG Interpretation None       Radiology Dg Chest [facility] 1 View  Result Date: 02/25/2016 CLINICAL DATA:  New tracheal tube placed. EXAM: PORTABLE CHEST 1 VIEW COMPARISON:  02/05/2016 FINDINGS: Endotracheal tube present with tip measuring 4 cm above the carina. Shallow inspiration. Heart size and pulmonary vascularity are normal. Lungs are clear and expanded. Calcification of the aorta. Postoperative changes in the right mandible. IMPRESSION: Endotracheal tube appears in satisfactory position. Shallow inspiration. No evidence of  active pulmonary disease. Electronically Signed   By: Lucienne Capers M.D.   On: 02/25/2016 05:28    Procedures Procedures (including critical care time)  Medications Ordered in ED Medications - No data to display   Initial Impression / Assessment and Plan / ED Course  I have reviewed the triage vital signs and the nursing notes.  Pertinent labs & imaging results that were available during my care of the patient were reviewed by me and considered in my medical decision making (see chart for details).  Clinical Course     Patient presents to have his trach collar changed after having his trach replaced last night in the emergency department. Patient denies any complications associated with this trach. Denies difficulty breathing. Patient states he was told by respiratory tech after having his trach collar changed in triage that his critical care physician wanted to evaluate his new trach. Salvadore Dom, critical care NP, came to evaluate pt in the ED. Pt cleared and trach intact. Salvadore Dom reports that is pt comes back to the ED in the future regarding his trach, to page him during the day to come down to evaluate the pt due to pt with hx of trach complications. Pt d/c home.   I personally performed the services described in this documentation, which was scribed in my presence. The recorded information has been reviewed and is accurate.   Final Clinical Impressions(s) / ED Diagnoses   Final diagnoses:  Tracheostomy care [facility])    New Prescriptions Discharge Medication List as of 02/25/2016  3:01 PM       Nona Dell, PA-C 02/25/16 [place], MD 02/26/16 1208

## 2016-02-25 NOTE — Consult Note (Signed)
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  Reports increased sputum production but no fever/chills or signs of infection.  Presents only for loosing trach and needing replacement.  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  Reports increased sputum production but no fever/chills or signs of infection.  Presents only for loosing trach and needing replacement.  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  has a past surgical history that includes PEG tube placement (11/1999); Tracheostomy (11/1999); and Gastrostomy tube placement. Prior to Admission medications   Medication Sig Start Date End Date Taking? Authorizing  Provider  AMBULATORY NON FORMULARY MEDICATION Trach(Shiley 6.0 XLT UP uncuffed), [place], [place] holders, Disposable inner cannulas 07/08/15   Gildardo Cranker, DO  betamethasone dipropionate 0.05 % lotion Apply topically 2 (two) times daily. To rash on leg Patient not taking: Reported on 01/30/2016 10/20/15   Gildardo Cranker, DO  ibuprofen (ADVIL,MOTRIN) 400 MG tablet take 1 tablet by mouth three times a day if needed for pain Patient taking differently: Take 400 mg by mouth three times daily as needed for pain 01/05/16   Gildardo Cranker, DO  losartan (COZAAR) 100 MG tablet take 1 tablet by mouth once daily Patient taking differently: Take 100 mg by mouth once daily 01/19/16   Gildardo Cranker, DO  naphazoline-pheniramine (NAPHCON-A) 0.025-0.3 % ophthalmic solution Place 2 drops into both eyes 4 (four) times daily as needed for irritation (red eye).    Historical Provider, MD  Nutritional Supplements (FEEDING SUPPLEMENT, JEVITY 1.2 CAL,) LIQD Place 237 mLs into feeding tube See admin instructions. Pt takes 2 cans at breakfast, 2 cans at lunch, 2 cans at dinner and 2 at night Patient not taking: Reported on 01/30/2016 12/02/14   Gildardo Cranker, DO  oxymetazoline (12 HOUR NASAL SPRAY) 0.05 % nasal spray Place 2 sprays into the nose 2 (two) times daily as needed for congestion.     Historical Provider, MD  predniSONE (DELTASONE) 50 MG tablet 1 tablet PO QD X4 days 01/30/16   Ripley Fraise, MD  ranitidine (ZANTAC) 150 MG tablet take 1 tablet by mouth twice a day Patient taking differently: Take 150 mg by mouth twice daily 11/26/15   [name], NP  triamcinolone cream (KENALOG) 0.1 % Apply 1 application topically 2 (two) times daily. Patient taking differently: Apply 1 application topically 2 (two) times daily as needed (rash).  07/02/15   Gildardo Cranker, DO   Allergies  Allergen Reactions  . Oxybutynin Chloride Nausea And Vomiting  . Codeine Nausea And Vomiting and Rash    FAMILY HISTORY:    family history includes Cancer in his other; Diabetes in his mother and other; Heart disease in his father; Stroke in his other. SOCIAL HISTORY:  reports that he quit smoking about 19 years ago. He has a 23.00 pack-year smoking history. He has never used smokeless tobacco. He reports that he does not drink alcohol or use drugs.  REVIEW OF SYSTEMS:   Constitutional: Negative for fever, chills, weight loss, malaise/fatigue and diaphoresis.  HENT: Negative for hearing loss, ear pain, nosebleeds, congestion, sore throat, neck pain, tinnitus and ear discharge.   Eyes: Negative for blurred vision, double vision, photophobia, pain, discharge and redness.  Respiratory: Negative for cough, hemoptysis, sputum production, shortness of breath, wheezing and stridor.   Cardiovascular: Negative for chest pain, palpitations, orthopnea, claudication, leg swelling and PND.  Gastrointestinal: Negative for heartburn, nausea, vomiting, abdominal pain, diarrhea, constipation, blood in stool and melena.  Genitourinary: Negative for dysuria, urgency, frequency, hematuria and flank pain.  Musculoskeletal: Negative for myalgias, back pain, joint pain and falls.  Skin: Negative for itching and rash.  Neurological: Negative for dizziness, tingling, tremors, sensory change, speech change, focal weakness, seizures, loss of consciousness, weakness and headaches.  Endo/Heme/Allergies: Negative for environmental allergies and polydipsia. Does not bruise/bleed easily.  SUBJECTIVE:   VITAL SIGNS: Pulse Rate:  [65-90] 68 (01/12 0430) Resp:  [10-28] 16 (01/12 0430) BP: (112-222)/(74-136) 178/107 (01/12 0430) SpO2:  [90 %-100 %] 100 % (01/12 0430) FiO2 (%):  [28 %] 28 % (01/11 2320)  PHYSICAL EXAMINATION: General:  Chronically ill appearing male, stoma open, NAD Neuro:  Alert and interactive, moving all ext to commands HEENT:  [place]/AT, PERRL, EOM-I, swollen jaw and fixed neck. Cardiovascular:  RRR, Nl S1/S2, -M/R/G. Lungs:   CTA bilaterally. Abdomen:  Soft, NT, ND and +BS Musculoskeletal:  -edema and -tenderness Skin:  Intact  No results for input(s): NA, K, CL, CO2, BUN, CREATININE, GLUCOSE in the last 168 hours. No results for input(s): HGB, HCT, WBC, PLT in the last 168 hours. No results found.  ASSESSMENT / PLAN:  66 year old male with radiation to the neck requiring a tracheostomy in 2001.  Presenting now with loss of trach that needed to be replaced bedside.  Hypoxemic.  Trach status:  - Replace trach over a changer since unable to replace with obturator  Hypoxemia:  - Titrate O2 for sat of 88-92%.  Disposition: further planning per EDP, likely discharge, does not need admission in my opinion unless BP remains an issue.  PCCM will sign off, please call back if needed.  Rush Farmer, M.D. [facility] Pulmonary/Critical Care Medicine. Pager: [phone]. After hours pager: [phone].  02/25/2016, 4:56 AM

## 2016-02-25 NOTE — ED Notes (Signed)
Pt left after cleared by Collier [place], NP without receiving his d/c instructions.

## 2016-02-25 NOTE — ED Notes (Signed)
Intensive care MD at the bedside. 

## 2016-02-28 ENCOUNTER — Encounter (HOSPITAL_COMMUNITY): Payer: Self-pay | Admitting: Emergency Medicine

## 2016-02-28 ENCOUNTER — Ambulatory Visit (HOSPITAL_COMMUNITY)
Admission: EM | Admit: 2016-02-28 | Discharge: 2016-02-28 | Disposition: A | Discharge status: Home / Self Care | Payer: Medicare Other | Attending: Emergency Medicine | Admitting: Emergency Medicine

## 2016-02-28 DIAGNOSIS — K9423 Gastrostomy malfunction: Secondary | ICD-10-CM | POA: Diagnosis not present

## 2016-02-28 DIAGNOSIS — R062 Wheezing: Secondary | ICD-10-CM

## 2016-02-28 MED ORDER — IPRATROPIUM-ALBUTEROL 0.5-2.5 (3) MG/3ML IN SOLN
RESPIRATORY_TRACT | Status: AC
  Filled 2016-02-28: qty 3

## 2016-02-28 MED ORDER — IPRATROPIUM-ALBUTEROL 0.5-2.5 (3) MG/3ML IN SOLN
3.0000 mL | Freq: Once | RESPIRATORY_TRACT | Status: AC
  Administered 2016-02-28: 3 mL via RESPIRATORY_TRACT

## 2016-02-28 MED ORDER — ALBUTEROL SULFATE HFA 108 (90 BASE) MCG/ACT IN AERS
INHALATION_SPRAY | RESPIRATORY_TRACT | Status: AC
  Filled 2016-02-28: qty 6.7

## 2016-02-28 MED ORDER — ALBUTEROL SULFATE HFA 108 (90 BASE) MCG/ACT IN AERS: 2.0000 | INHALATION_SPRAY | RESPIRATORY_TRACT | 0 refills | Status: DC | PRN

## 2016-02-28 MED ORDER — ALBUTEROL SULFATE HFA 108 (90 BASE) MCG/ACT IN AERS
2.0000 | INHALATION_SPRAY | Freq: Once | RESPIRATORY_TRACT | Status: AC
Start: 2016-02-28 — End: 2016-02-28
  Administered 2016-02-28: 2 via RESPIRATORY_TRACT

## 2016-02-28 NOTE — ED Triage Notes (Signed)
Patient has been seen at [facility] before and appreciates breathing treatments, they help.  Has seen pcp and a specialist but no help.  Patient has an appt on Friday and hoping for a referral to someone that he feels is helpful. Patient repeatedly says breathing treatment is helpful

## 2016-02-28 NOTE — ED Provider Notes (Signed)
[facility]    CSN: [id number] Arrival date & time: 02/28/16  1057     History   Chief Complaint Chief Complaint  Patient presents with  . Shortness of Breath    HPI Nicholas Cobb is a 66 y.o. male.   HPI  He is a 66 year old man here for evaluation of shortness of breath. He has a tracheostomy secondary to throat cancer. He states for the last 3 months he has had trouble with shortness of breath. He describes it as tightness and wheezing. He was seen here previously and given breathing treatment which he states helped a lot. He has followed up with his PCP in pulmonology, but he states they have not done anything that has helped his breathing. Nothing is new or different today.  Past Medical History:  Diagnosis Date  . Allergy   . Cancer ([place]) 1999   throat cancer  . Cervical spondylolysis 04/30/2011   severe  . Cervical spondylosis 03/25/2011  . Change in voice   . Chronic pain 03/25/2011  . Chronic sinusitis 04/27/2011  . Depression   . Difficulty urinating   . Emphysema 03/25/2011  . Essential hypertension, benign 12/03/2012  . GERD (gastroesophageal reflux disease) 03/25/2011  . Hearing loss   . Impaired glucose tolerance 04/27/2011  . 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 Rash   . Trouble swallowing   . Type II or unspecified type diabetes mellitus without mention of complication, uncontrolled 04/27/2011  . Ulcer ([place])   . UTI (lower urinary tract infection)   . Xerostomia 03/25/2011    Patient Active Problem List   Diagnosis Date Noted  . Tracheostomy care ([name])   . Hypoxemia   . Hemoptysis 01/26/2016  . Throat cancer ([name])   . Controlled diabetes mellitus type 2 with complications ([place])   . Chronic respiratory failure ([name]) 06/14/2015  . Respiratory failure ([name]) 06/13/2015  . Tracheostomy dependence ([name])   . Cellulitis and abscess of head   . Cellulitis of face   . Tracheostomy status ([name])   . Acute sinus infection 10/29/2013  . Rash  10/29/2013  . Hyponatremia 12/03/2012  . Essential hypertension 12/03/2012  . Cervical spondylolysis 04/30/2011  . Chronic sinusitis 04/27/2011  . Diabetes 04/27/2011  . Erectile dysfunction 04/19/2011  . Preventative health care 03/25/2011  . Cervical spondylosis 03/25/2011  . Emphysema 03/25/2011  . GERD (gastroesophageal reflux disease) 03/25/2011  . Chronic pain 03/25/2011  . 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 Xerostomia 03/25/2011  . Tracheostomy in place [facility]) 02/04/2011  . Laryngeal cancer ([name]) 02/04/2011  . Encounter for PEG (percutaneous endoscopic gastrostomy) ([name]) 02/04/2011  . Attention to G-tube [facility]) 11/07/2010    Past Surgical History:  Procedure Laterality Date  . GASTROSTOMY TUBE PLACEMENT    . PEG TUBE PLACEMENT  11/1999  . TRACHEOSTOMY  11/1999       Home Medications    Prior to Admission medications   Medication Sig Start Date End Date Taking? Authorizing Provider  albuterol (PROVENTIL HFA;VENTOLIN HFA) 108 (90 Base) MCG/ACT inhaler Inhale 2 puffs into the lungs every 4 (four) hours as needed for wheezing or shortness of breath. 02/28/16   Melony Overly, MD  AMBULATORY NON FORMULARY MEDICATION Trach(Shiley 6.0 XLT UP uncuffed), [place], [place] holders, Disposable inner cannulas 07/08/15   Gildardo Cranker, DO  betamethasone dipropionate 0.05 % lotion Apply topically 2 (two) times daily. To rash on leg Patient not taking: Reported on 01/30/2016 10/20/15   Gildardo Cranker, DO  ibuprofen (ADVIL,MOTRIN) 400 MG tablet take 1 tablet by mouth three times a day if needed for pain Patient taking differently: Take 400 mg by mouth three times daily as needed for pain 01/05/16   Gildardo Cranker, DO  losartan (COZAAR) 100 MG tablet take 1 tablet by mouth once daily Patient taking differently: Take 100 mg by mouth once daily 01/19/16   Gildardo Cranker, DO  naphazoline-pheniramine (NAPHCON-A) 0.025-0.3 % ophthalmic solution Place 2 drops into both eyes 4 (four) times  daily as needed for irritation (red eye).    Historical Provider, MD  Nutritional Supplements (FEEDING SUPPLEMENT, JEVITY 1.2 CAL,) LIQD Place 237 mLs into feeding tube See admin instructions. Pt takes 2 cans at breakfast, 2 cans at lunch, 2 cans at dinner and 2 at night Patient not taking: Reported on 01/30/2016 12/02/14   Gildardo Cranker, DO  oxymetazoline (12 HOUR NASAL SPRAY) 0.05 % nasal spray Place 2 sprays into the nose 2 (two) times daily as needed for congestion.     Historical Provider, MD  predniSONE (DELTASONE) 50 MG tablet 1 tablet PO QD X4 days 01/30/16   Ripley Fraise, MD  ranitidine (ZANTAC) 150 MG tablet take 1 tablet by mouth twice a day Patient taking differently: Take 150 mg by mouth twice daily 11/26/15   [name], NP  triamcinolone cream (KENALOG) 0.1 % Apply 1 application topically 2 (two) times daily. Patient taking differently: Apply 1 application topically 2 (two) times daily as needed (rash).  07/02/15   Gildardo Cranker, DO    Family History Family History  Problem Relation Age of Onset  . Diabetes Mother   . Heart disease Father   . Stroke Other   . Diabetes Other   . Cancer Other     lung cancer    Social History Social History  Substance Use Topics  . Smoking status: Former Smoker    Packs/day: 1.00    Years: 23.00    Quit date: 11/06/1996  . Smokeless tobacco: Never Used  . Alcohol use No     Allergies   Oxybutynin chloride and Codeine   Review of Systems Review of Systems As in history of present illness  Physical Exam Triage Vital Signs ED Triage Vitals  Enc Vitals Group     BP 02/28/16 1146 (!) 157/105     Pulse Rate 02/28/16 1146 93     Resp 02/28/16 1141 (!) 32     Temp 02/28/16 1146 98.3 F (36.8 C)     Temp Source 02/28/16 1146 Oral     SpO2 02/28/16 1146 95 %     Weight --      Height --      Head Circumference --      Peak Flow --      Pain Score --      Pain Loc --      Pain Edu? --      Excl. in [place]? --    No  data found.   Updated Vital Signs BP (!) 157/105 (BP Location: Right Arm)   Pulse 93   Temp 98.3 F (36.8 C) (Oral)   Resp (!) 32 Comment: post ambulation  SpO2 95%   Visual Acuity Right Eye Distance:   Left Eye Distance:   Bilateral Distance:    Right Eye Near:   Left Eye Near:    Bilateral Near:     Physical Exam  Constitutional: He is oriented to person, place, and time. He appears well-developed and well-nourished.  No distress.  Neck:  Trach in place  Cardiovascular: Normal rate.   Pulmonary/Chest: Effort normal. No respiratory distress. He has wheezes (expiratory).  Neurological: He is alert and oriented to person, place, and time.     UC Treatments / Results  Labs (all labs ordered are listed, but only abnormal results are displayed) Labs Reviewed - No data to display  EKG  EKG Interpretation None       Radiology No results found.  Procedures Procedures (including critical care time)  Medications Ordered in UC Medications  albuterol (PROVENTIL HFA;VENTOLIN HFA) 108 (90 Base) MCG/ACT inhaler 2 puff (not administered)  ipratropium-albuterol (DUONEB) 0.5-2.5 (3) MG/3ML nebulizer solution 3 mL (3 mLs Nebulization Given 02/28/16 1216)     Initial Impression / Assessment and Plan / UC Course  I have reviewed the triage vital signs and the nursing notes.  Pertinent labs & imaging results that were available during my care of the patient were reviewed by me and considered in my medical decision making (see chart for details).  Clinical Course     Wheezing resolved after DuoNeb. Patient discharged with an albuterol inhaler to use as needed with his trach mask. Prescription sent to [facility] as well. Follow-up as needed.  Final Clinical Impressions(s) / UC Diagnoses   Final diagnoses:  Wheezing    New Prescriptions New Prescriptions   ALBUTEROL (PROVENTIL HFA;VENTOLIN HFA) 108 (90 BASE) MCG/ACT INHALER    Inhale 2 puffs into the lungs every 4 (four)  hours as needed for wheezing or shortness of breath.     Melony Overly, MD 02/28/16 1321

## 2016-02-28 NOTE — Discharge Instructions (Signed)
I suspect you have some reactive airways or asthma. Use the albuterol inhaler every 4 hours as needed for wheezing or shortness of breath. Use 2-4 puffs. Use this with the trach mask. I have sent a prescription to the pharmacy. Follow up as needed.

## 2016-02-28 NOTE — ED Notes (Signed)
Patient's tracheostomy was suctioned using a 14 french catheter per physician's order. No obvious secretions were noted.

## 2016-02-29 ENCOUNTER — Other Ambulatory Visit (HOSPITAL_COMMUNITY): Payer: Self-pay | Admitting: General Surgery

## 2016-02-29 DIAGNOSIS — R633 Feeding difficulties, unspecified: Secondary | ICD-10-CM

## 2016-03-01 ENCOUNTER — Inpatient Hospital Stay (HOSPITAL_COMMUNITY): Admission: RE | Admit: 2016-03-01 | Payer: Medicare Other | Source: Ambulatory Visit

## 2016-03-03 ENCOUNTER — Ambulatory Visit: Payer: Medicare Other | Admitting: Internal Medicine

## 2016-03-08 ENCOUNTER — Other Ambulatory Visit (HOSPITAL_COMMUNITY): Payer: Medicare Other

## 2016-03-09 ENCOUNTER — Encounter: Payer: Self-pay | Admitting: Nurse Practitioner

## 2016-03-09 ENCOUNTER — Ambulatory Visit (INDEPENDENT_AMBULATORY_CARE_PROVIDER_SITE_OTHER): Payer: Medicare Other | Admitting: Nurse Practitioner

## 2016-03-09 ENCOUNTER — Telehealth: Payer: Self-pay

## 2016-03-09 VITALS — BP 138/76 | HR 80 | Temp 98.1°F | Resp 18 | Ht 69.0 in | Wt 171.6 lb

## 2016-03-09 DIAGNOSIS — R062 Wheezing: Secondary | ICD-10-CM | POA: Diagnosis not present

## 2016-03-09 DIAGNOSIS — C329 Malignant neoplasm of larynx, unspecified: Secondary | ICD-10-CM

## 2016-03-09 DIAGNOSIS — R4701 Aphasia: Secondary | ICD-10-CM

## 2016-03-09 MED ORDER — DOXYCYCLINE HYCLATE 100 MG PO TABS: 100.0000 mg | ORAL_TABLET | Freq: Two times a day (BID) | ORAL | 0 refills | Status: DC

## 2016-03-09 MED ORDER — PREDNISONE 20 MG PO TABS: ORAL_TABLET | ORAL | 0 refills | Status: DC

## 2016-03-09 MED ORDER — ALBUTEROL SULFATE HFA 108 (90 BASE) MCG/ACT IN AERS: 2.0000 | INHALATION_SPRAY | RESPIRATORY_TRACT | 0 refills | Status: AC | PRN

## 2016-03-09 NOTE — Progress Notes (Signed)
Careteam: Patient Care Team: [name], NP as PCP - General (Nurse Practitioner)  Advanced Directive information Does Patient Have a Medical Advance Directive?: No  Allergies  Allergen Reactions  . Oxybutynin Chloride Nausea And Vomiting  . Codeine Nausea And Vomiting and Rash    Chief Complaint  Patient presents with  . Medical Management of Chronic Issues    Complete paperwork for home assistance.      HPI: Patient is a 66 y.o. male seen in the office today due to having paperwork completed. pts daughter is trying to help Nicholas Cobb get more assistance in the home due to his medial conditions.  Pt has had multiple Ed visits due to difficulty with trach and wheezing.  Pt is nonverbal due to throat cancer s/p radiation and needs more assistance. Today he is in office with increase shortness of breath and states he has been using albuterol frequently.  Out of inhaler that he got filled 10 days ago.  No changes in the color of sputum. He has never been seen in a pulmonary office.  Pt went to urgent care 4 weeks ago and they stated wheezing was from asthma vs URI Not currently taking prednisone but has been on in the past.   Review of Systems:  Review of Systems  Constitutional: Negative for fever.  HENT:       Increased mucus production  Respiratory: Positive for shortness of breath and wheezing. Negative for chest tightness.   Cardiovascular: Negative for chest pain and leg swelling.  Musculoskeletal: Positive for arthralgias.  Skin:       Dry, itchy skin    Past Medical History:  Diagnosis Date  . Allergy   . Cancer ([place]) 1999   throat cancer  . Cervical spondylolysis 04/30/2011   severe  . Cervical spondylosis 03/25/2011  . Change in voice   . Chronic pain 03/25/2011  . Chronic sinusitis 04/27/2011  . Depression   . Difficulty urinating   . Emphysema 03/25/2011  . Essential hypertension, benign 12/03/2012  . GERD (gastroesophageal reflux disease)  03/25/2011  . Hearing loss   . Impaired glucose tolerance 04/27/2011  . 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 Rash   . Trouble swallowing   . Type II or unspecified type diabetes mellitus without mention of complication, uncontrolled 04/27/2011  . Ulcer ([place])   . UTI (lower urinary tract infection)   . Xerostomia 03/25/2011   Past Surgical History:  Procedure Laterality Date  . GASTROSTOMY TUBE PLACEMENT    . PEG TUBE PLACEMENT  11/1999  . TRACHEOSTOMY  11/1999   Social History:   reports that he quit smoking about 19 years ago. He has a 23.00 pack-year smoking history. He has never used smokeless tobacco. He reports that he does not drink alcohol or use drugs.  Family History  Problem Relation Age of Onset  . Diabetes Mother   . Heart disease Father   . Stroke Other   . Diabetes Other   . Cancer Other     lung cancer    Medications: Patient's Medications  New Prescriptions   No medications on file  Previous Medications   ALBUTEROL (PROVENTIL HFA;VENTOLIN HFA) 108 (90 BASE) MCG/ACT INHALER    Inhale 2 puffs into the lungs every 4 (four) hours as needed for wheezing or shortness of breath.   AMBULATORY NON FORMULARY MEDICATION    Trach(Shiley 6.0 XLT UP uncuffed), Trach Care Kit, Trach holders, Disposable inner cannulas   BETAMETHASONE DIPROPIONATE 0.05 %  LOTION    Apply topically 2 (two) times daily. To rash on leg   IBUPROFEN (ADVIL,MOTRIN) 400 MG TABLET    take 1 tablet by mouth three times a day if needed for pain   LOSARTAN (COZAAR) 100 MG TABLET    take 1 tablet by mouth once daily   NAPHAZOLINE-PHENIRAMINE (NAPHCON-A) 0.025-0.3 % OPHTHALMIC SOLUTION    Place 2 drops into both eyes 4 (four) times daily as needed for irritation (red eye).   NUTRITIONAL SUPPLEMENTS (FEEDING SUPPLEMENT, JEVITY 1.2 CAL,) LIQD    Place 237 mLs into feeding tube See admin instructions. Pt takes 2 cans at breakfast, 2 cans at lunch, 2 cans at dinner and 2 at night   OXYMETAZOLINE (12 HOUR NASAL  SPRAY) 0.05 % NASAL SPRAY    Place 2 sprays into the nose 2 (two) times daily as needed for congestion.    PREDNISONE (DELTASONE) 50 MG TABLET    1 tablet PO QD X4 days   RANITIDINE (ZANTAC) 150 MG TABLET    take 1 tablet by mouth twice a day   TRIAMCINOLONE CREAM (KENALOG) 0.1 %    Apply 1 application topically 2 (two) times daily.  Modified Medications   No medications on file  Discontinued Medications   No medications on file     Physical Exam:  Vitals:   03/09/16 1103  BP: 138/76  Pulse: 80  Resp: 18  Temp: 98.1 F (36.7 C)  TempSrc: Oral  SpO2: 90%  Weight: 171 lb 9.6 oz (77.8 kg)  Height: _0  (1.753 m)   Body mass index is 25.34 kg/m.  Physical Exam  Constitutional: He is oriented to person, place, and time. He appears well-developed and well-nourished.  HENT:  Facial swelling R>L  Neck:  Trach present  Cardiovascular: Normal rate, regular rhythm and normal heart sounds.   Pulmonary/Chest: Effort normal. He has wheezes (throughout all lobes).  Abdominal: Soft. Bowel sounds are normal.  PEG tube in place  Musculoskeletal: Normal range of motion. He exhibits no edema.  Neurological: He is alert and oriented to person, place, and time.  Skin: Skin is warm and dry.  Psychiatric: Judgment and thought content normal.  Writes to communicate    Labs reviewed: Basic Metabolic Panel:  Recent Labs  06/13/15 0701  09/14/15 0440 09/15/15 0205  10/20/15 0938 01/04/16 1528 01/30/16 0257  NA 135  < >  --  133*  < > 131* 132* 131*  K 5.3*  < >  --  3.7  < > 4.4 4.5 4.4  CL 104  < >  --  99*  < > 92* 95* 93*  CO2 21*  < >  --  27  < > _1 GLUCOSE 95  < >  --  128*  < > 90 90 168*  BUN 17  < >  --  12  < > _2 CREATININE 0.99  < >  --  0.91  < > 1.00 0.92 1.08  CALCIUM 8.2*  < >  --  9.0  < > 9.5 9.2 8.9  MG 2.0  --  2.4 2.0  --   --   --   --   PHOS 2.9  --  5.8* 2.9  --   --   --   --   < > = values in this interval not displayed. Liver Function  Tests:  Recent Labs  06/13/15 0218 09/16/15 0327 10/20/15 0938  AST 37 79*  27  ALT 27 67* 19  ALKPHOS 113 101 112  BILITOT 0.4 0.4 0.4  PROT 7.8 6.3* 7.5  ALBUMIN 4.2 3.0* 4.2    Recent Labs  06/13/15 0701  LIPASE 26   No results for input(s): AMMONIA in the last 8760 hours. CBC:  Recent Labs  09/14/15 0255  09/16/15 0327 01/26/16 1726 01/30/16 0257  WBC 11.5*  < > 6.5 5.2 5.1  NEUTROABS 7.4  --   --  3.2 3.3  HGB 14.8  < > 13.4 15.2 14.5  HCT 45.5  < > 41.2 45.2 43.1  MCV 92.5  < > 91.8 89.7 89.0  PLT 231  < > 184 221.0 197  < > = values in this interval not displayed. Lipid Panel:  Recent Labs  06/13/15 0218 09/14/15 0255  TRIG 120 119   TSH: No results for input(s): TSH in the last 8760 hours. A1C: Lab Results  Component Value Date   HGBA1C 6.5 (H) 10/20/2015     Assessment/Plan 1. Laryngeal cancer [facility]) S/p radiation and trach. Frequently followed with trach clinic. Pt would benefit from increase care. Paper work completed.   2. Nonverbal Needing increased assistance, form completed for additional resource   3. Wheezing Ongoing wheezing with shortness of breath. Using albuterol frequently and inhaler is empty after being filled just 10 days ago. Pt has been to the ED due to wheezing on multiple occasions. - predniSONE (DELTASONE) 20 MG tablet; 2 tablets daily for 6 days  Dispense: 12 tablet; Refill: 0 - albuterol (PROVENTIL HFA;VENTOLIN HFA) 108 (90 Base) MCG/ACT inhaler; Inhale 2 puffs into the lungs every 4 (four) hours as needed for wheezing or shortness of breath.  Dispense: 1 Inhaler; Refill: 0 - will treat for URI with doxycycline (VIBRA-TABS) 100 MG tablet; Take 1 tablet (100 mg total) by mouth 2 (two) times daily.  Dispense: 14 tablet; Refill: 0 -appt made with pulmonary to further evaluation of ongoing wheezing.  -return precautions discussed -has follow up scheduled.   Carlos American. Harle Battiest  [facility] [phone] 8 am - 5 pm) [phone] (after hours)

## 2016-03-09 NOTE — Telephone Encounter (Signed)
Left message on voicemail for patient to return call when available   Reason for call: Per patient's father I need to call his daughter to get available dates and times to schedule pulmonary follow-up.

## 2016-03-09 NOTE — Patient Instructions (Signed)
TO start prednisone 40 mg daily for 6 days  Albuterol has been refilled but will need pulmonary referral before further refills are provided  Start doxycycline (VIBRA-TABS) 100 MG tablet; Take 1 tablet (100 mg total) by mouth 2 (two) times daily for URI

## 2016-03-10 NOTE — Telephone Encounter (Signed)
Left message on voicemail for patient's daughter informing her patient was advised to follow-up with pulmonary doctor. Per patient, his daughter will need to schedule the appointment.

## 2016-03-14 ENCOUNTER — Other Ambulatory Visit (HOSPITAL_COMMUNITY): Payer: Medicare Other

## 2016-03-17 DIAGNOSIS — C153 Malignant neoplasm of upper third of esophagus: Secondary | ICD-10-CM | POA: Diagnosis not present

## 2016-03-20 ENCOUNTER — Encounter (HOSPITAL_COMMUNITY): Payer: Self-pay | Admitting: *Deleted

## 2016-03-20 ENCOUNTER — Ambulatory Visit (HOSPITAL_COMMUNITY)
Admission: EM | Admit: 2016-03-20 | Discharge: 2016-03-20 | Disposition: A | Discharge status: Nursing Home (Includes ICF) | Payer: Medicare Other

## 2016-03-20 ENCOUNTER — Emergency Department (HOSPITAL_COMMUNITY)
Admission: EM | Admit: 2016-03-20 | Discharge: 2016-03-20 | Disposition: A | Discharge status: Home / Self Care | Payer: Medicare Other | Attending: Emergency Medicine | Admitting: Emergency Medicine

## 2016-03-20 DIAGNOSIS — J9509 Other tracheostomy complication: Secondary | ICD-10-CM | POA: Insufficient documentation

## 2016-03-20 DIAGNOSIS — C154 Malignant neoplasm of middle third of esophagus: Secondary | ICD-10-CM | POA: Diagnosis not present

## 2016-03-20 DIAGNOSIS — H9319 Tinnitus, unspecified ear: Secondary | ICD-10-CM | POA: Diagnosis not present

## 2016-03-20 DIAGNOSIS — M8660 Other chronic osteomyelitis, unspecified site: Secondary | ICD-10-CM | POA: Diagnosis not present

## 2016-03-20 DIAGNOSIS — C153 Malignant neoplasm of upper third of esophagus: Secondary | ICD-10-CM | POA: Diagnosis not present

## 2016-03-20 DIAGNOSIS — K117 Disturbances of salivary secretion: Secondary | ICD-10-CM | POA: Diagnosis not present

## 2016-03-20 DIAGNOSIS — E1169 Type 2 diabetes mellitus with other specified complication: Secondary | ICD-10-CM | POA: Insufficient documentation

## 2016-03-20 DIAGNOSIS — Z85818 Personal history of malignant neoplasm of other sites of lip, oral cavity, and pharynx: Secondary | ICD-10-CM | POA: Insufficient documentation

## 2016-03-20 DIAGNOSIS — Z930 Tracheostomy status: Secondary | ICD-10-CM | POA: Diagnosis not present

## 2016-03-20 DIAGNOSIS — Z79899 Other long term (current) drug therapy: Secondary | ICD-10-CM | POA: Diagnosis not present

## 2016-03-20 DIAGNOSIS — I10 Essential (primary) hypertension: Secondary | ICD-10-CM | POA: Insufficient documentation

## 2016-03-20 DIAGNOSIS — Z87891 Personal history of nicotine dependence: Secondary | ICD-10-CM | POA: Insufficient documentation

## 2016-03-20 DIAGNOSIS — J398 Other specified diseases of upper respiratory tract: Secondary | ICD-10-CM | POA: Diagnosis not present

## 2016-03-20 DIAGNOSIS — R609 Edema, unspecified: Secondary | ICD-10-CM | POA: Diagnosis not present

## 2016-03-20 NOTE — ED Provider Notes (Signed)
Nicholas Cobb Provider Note   CSN: [id number] Arrival date & time: 03/20/16  1027     History   Chief Complaint Chief Complaint  Patient presents with  . Hurt    HPI Nicholas Cobb is a 66 y.o. male.  HPI  Pt presenting with request for suctioning of trach.  He states he usually does it daily, but his suction machine broke yesterday.  He otherwise has no acute problems.  There are no other associated systemic symptoms, there are no other alleviating or modifying factors.  He is requesting that we call the medical equipment company to let them know that his machine is broken.   Past Medical History:  Diagnosis Date  . Allergy   . Cancer ([place]) 1999   throat cancer  . Cervical spondylolysis 04/30/2011   severe  . Cervical spondylosis 03/25/2011  . Change in voice   . Chronic pain 03/25/2011  . Chronic sinusitis 04/27/2011  . Depression   . Difficulty urinating   . Emphysema 03/25/2011  . Essential hypertension, benign 12/03/2012  . GERD (gastroesophageal reflux disease) 03/25/2011  . Hearing loss   . Impaired glucose tolerance 04/27/2011  . 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 Rash   . Trouble swallowing   . Type II or unspecified type diabetes mellitus without mention of complication, uncontrolled 04/27/2011  . Ulcer ([place])   . UTI (lower urinary tract infection)   . Xerostomia 03/25/2011    Patient Active Problem List   Diagnosis Date Noted  . Tracheostomy care ([name])   . Hypoxemia   . Hemoptysis 01/26/2016  . Throat cancer ([place])   . Controlled diabetes mellitus type 2 with complications ([place])   . Chronic respiratory failure ([place]) 06/14/2015  . Respiratory failure ([place]) 06/13/2015  . Tracheostomy dependence ([place])   . Cellulitis and abscess of head   . Cellulitis of face   . Tracheostomy status ([place])   . Acute sinus infection 10/29/2013  . Rash 10/29/2013  . Hyponatremia 12/03/2012  . Essential hypertension 12/03/2012  . Cervical spondylolysis 04/30/2011    . Chronic sinusitis 04/27/2011  . Diabetes 04/27/2011  . Erectile dysfunction 04/19/2011  . Preventative health care 03/25/2011  . Cervical spondylosis 03/25/2011  . Emphysema 03/25/2011  . GERD (gastroesophageal reflux disease) 03/25/2011  . Chronic pain 03/25/2011  . 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 Xerostomia 03/25/2011  . Tracheostomy in place [facility]) 02/04/2011  . Laryngeal cancer ([place]) 02/04/2011  . Encounter for PEG (percutaneous endoscopic gastrostomy) ([place]) 02/04/2011  . Attention to G-tube [facility]) 11/07/2010    Past Surgical History:  Procedure Laterality Date  . GASTROSTOMY TUBE PLACEMENT    . PEG TUBE PLACEMENT  11/1999  . TRACHEOSTOMY  11/1999       Home Medications    Prior to Admission medications   Medication Sig Start Date End Date Taking? Authorizing Provider  albuterol (PROVENTIL HFA;VENTOLIN HFA) 108 (90 Base) MCG/ACT inhaler Inhale 2 puffs into the lungs every 4 (four) hours as needed for wheezing or shortness of breath. 03/09/16   [name], NP  AMBULATORY NON FORMULARY MEDICATION Trach(Shiley 6.0 XLT UP uncuffed), [place], [place] holders, Disposable inner cannulas 07/08/15   Gildardo Cranker, DO  betamethasone dipropionate 0.05 % lotion Apply topically 2 (two) times daily. To rash on leg 10/20/15   Gildardo Cranker, DO  doxycycline (VIBRA-TABS) 100 MG tablet Take 1 tablet (100 mg total) by mouth 2 (two) times daily. 03/09/16   [name], NP  ibuprofen (  ADVIL,MOTRIN) 400 MG tablet take 1 tablet by mouth three times a day if needed for pain 01/05/16   Gildardo Cranker, DO  losartan (COZAAR) 100 MG tablet take 1 tablet by mouth once daily 01/19/16   Gildardo Cranker, DO  naphazoline-pheniramine (NAPHCON-A) 0.025-0.3 % ophthalmic solution Place 2 drops into both eyes 4 (four) times daily as needed for irritation (red eye).    Historical Provider, MD  Nutritional Supplements (FEEDING SUPPLEMENT, JEVITY 1.2 CAL,) LIQD Place 237 mLs into feeding tube  See admin instructions. Pt takes 2 cans at breakfast, 2 cans at lunch, 2 cans at dinner and 2 at night 12/02/14   Gildardo Cranker, DO  oxymetazoline (12 HOUR NASAL SPRAY) 0.05 % nasal spray Place 2 sprays into the nose 2 (two) times daily as needed for congestion.     Historical Provider, MD  predniSONE (DELTASONE) 20 MG tablet 2 tablets daily for 6 days 03/09/16   [name], NP  ranitidine (ZANTAC) 150 MG tablet take 1 tablet by mouth twice a day 11/26/15   [name], NP  triamcinolone cream (KENALOG) 0.1 % Apply 1 application topically 2 (two) times daily. 07/02/15   Gildardo Cranker, DO    Family History Family History  Problem Relation Age of Onset  . Diabetes Mother   . Heart disease Father   . Stroke Other   . Diabetes Other   . Cancer Other     lung cancer    Social History Social History  Substance Use Topics  . Smoking status: Former Smoker    Packs/day: 1.00    Years: 23.00    Quit date: 11/06/1996  . Smokeless tobacco: Never Used  . Alcohol use No     Allergies   Oxybutynin chloride and Codeine   Review of Systems Review of Systems  ROS reviewed and all otherwise negative except for mentioned in HPI   Physical Exam Updated Vital Signs BP 177/95 (BP Location: Left Arm)   Pulse 91   Temp 98.5 F (36.9 C) (Oral)   Resp 22   SpO2 95%  Vitals reviewed Physical Exam Physical Examination: General appearance - alert, well appearing, and in no distress Mental status - alert, interactive, writing his communications down Eyes - no scleral icterus, no conjunctival injection Mouth - mucous membranes moist, pharynx normal without lesions Neck - trach in place Chest - clear to auscultation, no wheezes, rales or rhonchi, symmetric air entry Heart - normal rate, regular rhythm, normal S1, S2, no murmurs, rubs, clicks or gallops Neurological - alert, oriented, normal gait Extremities - peripheral pulses normal, no pedal edema, no clubbing or cyanosis Skin -  normal coloration and turgor, no rashes  ED Treatments / Results  Labs (all labs ordered are listed, but only abnormal results are displayed) Labs Reviewed - No data to display  EKG  EKG Interpretation None       Radiology No results found.  Procedures Procedures (including critical care time)  Medications Ordered in ED Medications - No data to display   Initial Impression / Assessment and Plan / ED Course  I have reviewed the triage vital signs and the nursing notes.  Pertinent labs & imaging results that were available during my care of the patient were reviewed by me and considered in my medical decision making (see chart for details).    12:09 PM respiratory has been down and suctioned trach.  Pt states he is ready to go as he is afraid he will miss another  appointment at [facility] later today.  He requests a new suction machine.   Nursing has made the call to the equipment company.  Discharged with strict return precautions.  Pt agreeable with plan.  Final Clinical Impressions(s) / ED Diagnoses   Final diagnoses:  Increased tracheal secretions    New Prescriptions Discharge Medication List as of 03/20/2016 12:19 PM       Alfonzo Beers, MD 03/20/16 1256

## 2016-03-20 NOTE — ED Triage Notes (Signed)
Pt states he needs to have his trach cleaned.  States his suction machine at home broke this am and he just needs help cleaning it.  Appears in no distress.

## 2016-03-20 NOTE — Discharge Instructions (Signed)
Return to the ED with any concerns including difficulty breathing, fever/chills, increased mucous production, or any other alarming symptoms

## 2016-03-20 NOTE — ED Notes (Signed)
Respiratory suctioned.

## 2016-03-27 ENCOUNTER — Encounter (HOSPITAL_COMMUNITY): Payer: Self-pay | Admitting: Emergency Medicine

## 2016-03-27 ENCOUNTER — Emergency Department (HOSPITAL_COMMUNITY)
Admission: EM | Admit: 2016-03-27 | Discharge: 2016-03-28 | Disposition: A | Discharge status: Home / Self Care | Payer: Medicare Other | Attending: Emergency Medicine | Admitting: Emergency Medicine

## 2016-03-27 DIAGNOSIS — Z85818 Personal history of malignant neoplasm of other sites of lip, oral cavity, and pharynx: Secondary | ICD-10-CM | POA: Insufficient documentation

## 2016-03-27 DIAGNOSIS — E119 Type 2 diabetes mellitus without complications: Secondary | ICD-10-CM | POA: Insufficient documentation

## 2016-03-27 DIAGNOSIS — R0602 Shortness of breath: Secondary | ICD-10-CM | POA: Diagnosis not present

## 2016-03-27 DIAGNOSIS — Z87891 Personal history of nicotine dependence: Secondary | ICD-10-CM | POA: Diagnosis not present

## 2016-03-27 DIAGNOSIS — J9503 Malfunction of tracheostomy stoma: Secondary | ICD-10-CM | POA: Insufficient documentation

## 2016-03-27 DIAGNOSIS — I10 Essential (primary) hypertension: Secondary | ICD-10-CM | POA: Diagnosis not present

## 2016-03-27 DIAGNOSIS — Z79899 Other long term (current) drug therapy: Secondary | ICD-10-CM | POA: Diagnosis not present

## 2016-03-27 DIAGNOSIS — J9509 Other tracheostomy complication: Secondary | ICD-10-CM

## 2016-03-27 NOTE — ED Provider Notes (Signed)
[facility] DEPT Provider Note   CSN: [id number] Arrival date & time: 03/27/16  2049     History   Chief Complaint Chief Complaint  Patient presents with  . Respiratory Distress    trach pulled out    HPI Nicholas Cobb is a 66 y.o. male.  Patient is a 66 year old male with multiple medical problems including chronic tracheostomy tube who is a difficult airway presenting today with trach out. Patient states that his trach became clogged with some blood and mucus and he was unable to breathe so he pulled it out. He currently denies any shortness of breath and is in no acute distress. However patient has had multiple issues with placement of his trach in the past even leading to cardiac arrest. He currently denies any shortness of breath, fever or new productive cough. He always has some secretions from his trach but it is at baseline.   The history is provided by the patient.    Past Medical History:  Diagnosis Date  . Allergy   . Cancer ([place]) 1999   throat cancer  . Cervical spondylolysis 04/30/2011   severe  . Cervical spondylosis 03/25/2011  . Change in voice   . Chronic pain 03/25/2011  . Chronic sinusitis 04/27/2011  . Depression   . Difficulty urinating   . Emphysema 03/25/2011  . Essential hypertension, benign 12/03/2012  . GERD (gastroesophageal reflux disease) 03/25/2011  . Hearing loss   . Impaired glucose tolerance 04/27/2011  . 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 Rash   . Trouble swallowing   . Type II or unspecified type diabetes mellitus without mention of complication, uncontrolled 04/27/2011  . Ulcer ([place])   . UTI (lower urinary tract infection)   . Xerostomia 03/25/2011    Patient Active Problem List   Diagnosis Date Noted  . Tracheostomy care ([place])   . Hypoxemia   . Hemoptysis 01/26/2016  . Throat cancer ([place])   . Controlled diabetes mellitus type 2 with complications ([place])   . Chronic respiratory failure ([place]) 06/14/2015  . Respiratory failure  ([place]) 06/13/2015  . Tracheostomy dependence ([place])   . Cellulitis and abscess of head   . Cellulitis of face   . Tracheostomy status ([place])   . Acute sinus infection 10/29/2013  . Rash 10/29/2013  . Hyponatremia 12/03/2012  . Essential hypertension 12/03/2012  . Cervical spondylolysis 04/30/2011  . Chronic sinusitis 04/27/2011  . Diabetes 04/27/2011  . Erectile dysfunction 04/19/2011  . Preventative health care 03/25/2011  . Cervical spondylosis 03/25/2011  . Emphysema 03/25/2011  . GERD (gastroesophageal reflux disease) 03/25/2011  . Chronic pain 03/25/2011  . 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 Xerostomia 03/25/2011  . Tracheostomy in place [facility]) 02/04/2011  . Laryngeal cancer ([place]) 02/04/2011  . Encounter for PEG (percutaneous endoscopic gastrostomy) ([place]) 02/04/2011  . Attention to G-tube [facility]) 11/07/2010    Past Surgical History:  Procedure Laterality Date  . GASTROSTOMY TUBE PLACEMENT    . PEG TUBE PLACEMENT  11/1999  . TRACHEOSTOMY  11/1999       Home Medications    Prior to Admission medications   Medication Sig Start Date End Date Taking? Authorizing Provider  albuterol (PROVENTIL HFA;VENTOLIN HFA) 108 (90 Base) MCG/ACT inhaler Inhale 2 puffs into the lungs every 4 (four) hours as needed for wheezing or shortness of breath. 03/09/16   [name], NP  AMBULATORY NON FORMULARY MEDICATION Trach(Shiley 6.0 XLT UP uncuffed), [place], [place] holders, Disposable inner cannulas 07/08/15   Monica  Eulas Post, DO  betamethasone dipropionate 0.05 % lotion Apply topically 2 (two) times daily. To rash on leg 10/20/15   Gildardo Cranker, DO  doxycycline (VIBRA-TABS) 100 MG tablet Take 1 tablet (100 mg total) by mouth 2 (two) times daily. 03/09/16   [name], NP  ibuprofen (ADVIL,MOTRIN) 400 MG tablet take 1 tablet by mouth three times a day if needed for pain 01/05/16   Gildardo Cranker, DO  losartan (COZAAR) 100 MG tablet take 1 tablet by mouth once daily 01/19/16    Gildardo Cranker, DO  naphazoline-pheniramine (NAPHCON-A) 0.025-0.3 % ophthalmic solution Place 2 drops into both eyes 4 (four) times daily as needed for irritation (red eye).    Historical Provider, MD  Nutritional Supplements (FEEDING SUPPLEMENT, JEVITY 1.2 CAL,) LIQD Place 237 mLs into feeding tube See admin instructions. Pt takes 2 cans at breakfast, 2 cans at lunch, 2 cans at dinner and 2 at night 12/02/14   Gildardo Cranker, DO  oxymetazoline (12 HOUR NASAL SPRAY) 0.05 % nasal spray Place 2 sprays into the nose 2 (two) times daily as needed for congestion.     Historical Provider, MD  predniSONE (DELTASONE) 20 MG tablet 2 tablets daily for 6 days 03/09/16   [name], NP  ranitidine (ZANTAC) 150 MG tablet take 1 tablet by mouth twice a day 11/26/15   [name], NP  triamcinolone cream (KENALOG) 0.1 % Apply 1 application topically 2 (two) times daily. 07/02/15   Gildardo Cranker, DO    Family History Family History  Problem Relation Age of Onset  . Diabetes Mother   . Heart disease Father   . Stroke Other   . Diabetes Other   . Cancer Other     lung cancer    Social History Social History  Substance Use Topics  . Smoking status: Former Smoker    Packs/day: 1.00    Years: 23.00    Quit date: 11/06/1996  . Smokeless tobacco: Never Used  . Alcohol use No     Allergies   Oxybutynin chloride and Codeine   Review of Systems Review of Systems  All other systems reviewed and are negative.    Physical Exam Updated Vital Signs BP 147/79   Pulse 62   Resp 22   SpO2 100%   Physical Exam  Constitutional: He is oriented to person, place, and time. He appears well-developed and well-nourished. No distress.  HENT:  Head: Normocephalic and atraumatic.  Facial deformity and swelling on the right.    Eyes: Conjunctivae and EOM are normal. Pupils are equal, round, and reactive to light.  Neck: Normal range of motion. Neck supple.  Ostomy with friable tissue and some mild  bleeding.  Restricted neck movement and skin changes of radiation  Cardiovascular: Normal rate, regular rhythm and intact distal pulses.   No murmur heard. Pulmonary/Chest: Effort normal and breath sounds normal. No respiratory distress. He has no wheezes. He has no rales.  Musculoskeletal: Normal range of motion. He exhibits no edema or tenderness.  Neurological: He is alert and oriented to person, place, and time.  Skin: Skin is warm and dry. No rash noted. No erythema.  Psychiatric: He has a normal mood and affect. His behavior is normal.  Nursing note and vitals reviewed.    ED Treatments / Results  Labs (all labs ordered are listed, but only abnormal results are displayed) Labs Reviewed - No data to display  EKG  EKG Interpretation None  Radiology No results found.  Procedures Procedures (including critical care time)  Medications Ordered in ED Medications - No data to display   Initial Impression / Assessment and Plan / ED Course  I have reviewed the triage vital signs and the nursing notes.  Pertinent labs & imaging results that were available during my care of the patient were reviewed by me and considered in my medical decision making (see chart for details).    Patient presents today with his tracheostomy tube out. It apparently got clogged earlier today and he removed it so he can breathe. He denies any new infectious symptoms, fever but has had ongoing issues with clogging of his trach, bleeding and secretions. Patient has had multiple issues with his trach being placed resulting in cardiac arrest more than one occasion. He has very limited neck motion due to his ongoing medical conditions and when attempts to place the trach at bedside I met significant resistance with some mild bleeding so critical care was consult it. They are the team who cares for patients trach. They came and placed the trach under bronchoscopy without complication. Some minor bleeding  was present but it was suctioned and his airway is clear. Oxygen saturation 100% and he is otherwise in no acute distress. Will discharge patient home.  Final Clinical Impressions(s) / ED Diagnoses   Final diagnoses:  Tracheostomy obstruction [facility])    New Prescriptions New Prescriptions   No medications on file     Blanchie Dessert, MD 03/28/16 0000

## 2016-03-27 NOTE — ED Notes (Signed)
44 XLT cuffless trach arrived from materials; EDP to place d/t patient being flagged for difficult airway

## 2016-03-27 NOTE — ED Notes (Signed)
EDP plunkett at bedside; A11 form sent to materials requesting new trach to be placed

## 2016-03-27 NOTE — ED Notes (Signed)
bronchoscope at bedside

## 2016-03-27 NOTE — ED Triage Notes (Signed)
Pt presents from home with trach removed; pt nonverbal but wrote note saying trach was full of blood and he could not breathe therefore he pulled it out; pt alert and oriented at this time; O2sats 100%

## 2016-03-28 ENCOUNTER — Emergency Department (HOSPITAL_COMMUNITY): Payer: Medicare Other

## 2016-03-28 DIAGNOSIS — J9503 Malfunction of tracheostomy stoma: Secondary | ICD-10-CM | POA: Diagnosis not present

## 2016-03-28 DIAGNOSIS — R0602 Shortness of breath: Secondary | ICD-10-CM | POA: Diagnosis not present

## 2016-03-28 NOTE — Consult Note (Signed)
PULMONARY / CRITICAL CARE MEDICINE   Name: Nicholas Cobb MRN: [id number] DOB: 10/02/1950    ADMISSION DATE:  03/27/2016 CONSULTATION DATE:  03/28/16  REFERRING MD:  W Plunkett MD  CHIEF COMPLAINT:  Trach change  HISTORY OF PRESENT ILLNESS:   66-year-old with history of osteonecrosis of the jaw, chronic trach. He is well-known to our service as he has need for recurrent trach changes secondary to occlusions. He is known to have a difficult airway He presents to the ED today after trach got clogged with blood and mucus. He pulled the trach out and it needs to be replaced.  PAST MEDICAL HISTORY :  He  has a past medical history of Allergy; Cancer (HCC) (1999); Cervical spondylolysis (04/30/2011); Cervical spondylosis (03/25/2011); Change in voice; Chronic pain (03/25/2011); Chronic sinusitis (04/27/2011); Depression; Difficulty urinating; Emphysema (03/25/2011); Essential hypertension, benign (12/03/2012); GERD (gastroesophageal reflux disease) (03/25/2011); Hearing loss; Impaired glucose tolerance (04/27/2011); Osteoradionecrosis of jaw (03/25/2011); Rash; Trouble swallowing; Type II or unspecified type diabetes mellitus without mention of complication, uncontrolled (04/27/2011); Ulcer (HCC); UTI (lower urinary tract infection); and Xerostomia (03/25/2011).  PAST SURGICAL HISTORY: He  has a past surgical history that includes PEG tube placement (11/1999); Tracheostomy (11/1999); and Gastrostomy tube placement.  Allergies  Allergen Reactions  . Oxybutynin Chloride Nausea And Vomiting  . Codeine Nausea And Vomiting and Rash    No current facility-administered medications on file prior to encounter.    Current Outpatient Prescriptions on File Prior to Encounter  Medication Sig  . albuterol (PROVENTIL HFA;VENTOLIN HFA) 108 (90 Base) MCG/ACT inhaler Inhale 2 puffs into the lungs every 4 (four) hours as needed for wheezing or shortness of breath.  . AMBULATORY NON FORMULARY MEDICATION Trach(Shiley 6.0 XLT  UP uncuffed), Trach Care Kit, Trach holders, Disposable inner cannulas  . betamethasone dipropionate 0.05 % lotion Apply topically 2 (two) times daily. To rash on leg  . doxycycline (VIBRA-TABS) 100 MG tablet Take 1 tablet (100 mg total) by mouth 2 (two) times daily.  . ibuprofen (ADVIL,MOTRIN) 400 MG tablet take 1 tablet by mouth three times a day if needed for pain  . losartan (COZAAR) 100 MG tablet take 1 tablet by mouth once daily  . naphazoline-pheniramine (NAPHCON-A) 0.025-0.3 % ophthalmic solution Place 2 drops into both eyes 4 (four) times daily as needed for irritation (red eye).  . Nutritional Supplements (FEEDING SUPPLEMENT, JEVITY 1.2 CAL,) LIQD Place 237 mLs into feeding tube See admin instructions. Pt takes 2 cans at breakfast, 2 cans at lunch, 2 cans at dinner and 2 at night  . oxymetazoline (12 HOUR NASAL SPRAY) 0.05 % nasal spray Place 2 sprays into the nose 2 (two) times daily as needed for congestion.   . predniSONE (DELTASONE) 20 MG tablet 2 tablets daily for 6 days  . ranitidine (ZANTAC) 150 MG tablet take 1 tablet by mouth twice a day  . triamcinolone cream (KENALOG) 0.1 % Apply 1 application topically 2 (two) times daily.    FAMILY HISTORY:  His indicated that his mother is deceased. He indicated that his father is deceased. He indicated that both of his sisters are deceased.    SOCIAL HISTORY: He  reports that he quit smoking about 19 years ago. He has a 23.00 pack-year smoking history. He has never used smokeless tobacco. He reports that he does not drink alcohol or use drugs.  REVIEW OF SYSTEMS:   Denies any cough, dyspnea, wheezing. Has some bleeding from the trach which is chronic Denies any chest pain,   palpitation. Denies any fevers, chills All other review of systems are negative  SUBJECTIVE:   VITAL SIGNS: BP 147/79   Pulse 62   Resp 22   SpO2 100%   HEMODYNAMICS:    VENTILATOR SETTINGS:    INTAKE / OUTPUT: No intake/output data  recorded.  PHYSICAL EXAMINATION: Blood pressure 152/84, pulse 86, resp. rate 22, SpO2 97 %. Gen:      No acute distress HEENT:  EOMI, sclera anicteric Neck:     No masses; no thyromegaly Lungs:    Clear to auscultation bilaterally; normal respiratory effort CV:         Regular rate and rhythm; no murmurs Abd:      + bowel sounds; soft, non-tender; no palpable masses, no distension Ext:    No edema; adequate peripheral perfusion Skin:      Warm and dry; no rash Neuro: alert and oriented x 3 Psych: normal mood and affect  LABS:  BMET No results for input(s): NA, K, CL, CO2, BUN, CREATININE, GLUCOSE in the last 168 hours.  Electrolytes No results for input(s): CALCIUM, MG, PHOS in the last 168 hours.  CBC No results for input(s): WBC, HGB, HCT, PLT in the last 168 hours.  Coag's No results for input(s): APTT, INR in the last 168 hours.  Sepsis Markers No results for input(s): LATICACIDVEN, PROCALCITON, O2SATVEN in the last 168 hours.  ABG No results for input(s): PHART, PCO2ART, PO2ART in the last 168 hours.  Liver Enzymes No results for input(s): AST, ALT, ALKPHOS, BILITOT, ALBUMIN in the last 168 hours.  Cardiac Enzymes No results for input(s): TROPONINI, PROBNP in the last 168 hours.  Glucose No results for input(s): GLUCAP in the last 168 hours.  Imaging No results found.  ASSESSMENT / PLAN: 66 year old with radiation to the neck, osteonecrosis of the jaw. Chronic trach (6 cuff less XLT) since 2001 Pulled out the trach due to clogging. It was unable to be replaced intially due to resistance and mild bleeding  1) Trach change Will replace trach over bronchoscope.  2) Hypoxemia. Supplemental O2 for sats > 90%  Should be able to go home after trach change.  Marshell Garfinkel MD [place] [facility] Pager [phone] If no answer or after 3pm call: [phone] 03/28/2016, 12:15 AM

## 2016-03-28 NOTE — Procedures (Signed)
Trach replacement.  Trach changed with the bronchoscope at bedside.  6 XLT lubed and inserted into position  CO2 indicator shows color change. Good air movement bilaterally Bronch reinserted through the trach. Appears to be in good position over trachea. Some bleeding noted from the trach which as suctioned out.  CXR pending to confirm trach position.  Marshell Garfinkel MD [place] [facility] Pager [phone] If no answer or after 3pm call: [phone] 03/28/2016, 12:17 AM

## 2016-03-29 ENCOUNTER — Other Ambulatory Visit: Payer: Self-pay

## 2016-03-29 NOTE — Patient Outreach (Signed)
[facility]) Care Management  03/29/2016  MALEKO PATE 05-12-1950 [id number]   Unsuccessful screening call placed to patient referred from the ED Census List. HIPAA appropriate message left with call back information.  If no response RN will make another attempt within one week.  Candie Mile, RN, MSN [place] [phone] Fax [phone]

## 2016-03-30 ENCOUNTER — Other Ambulatory Visit: Payer: Self-pay

## 2016-03-30 NOTE — Patient Outreach (Signed)
[facility]) Care Management  03/30/2016  Nicholas Cobb 1950/08/10 [id number]   Second unsuccessful attempt to reach patient.  HIPAA appropriate message left requesting call back. If no response RN will make another attempt within one week.  Candie Mile, RN, MSN [place] [phone] Fax [phone]

## 2016-04-03 ENCOUNTER — Encounter (HOSPITAL_COMMUNITY): Payer: Self-pay | Admitting: Interventional Radiology

## 2016-04-03 ENCOUNTER — Ambulatory Visit (HOSPITAL_COMMUNITY)
Admission: RE | Admit: 2016-04-03 | Discharge: 2016-04-03 | Disposition: A | Discharge status: Home / Self Care | Payer: Medicare Other | Source: Ambulatory Visit | Attending: General Surgery | Admitting: General Surgery

## 2016-04-03 ENCOUNTER — Other Ambulatory Visit (HOSPITAL_COMMUNITY): Payer: Self-pay | Admitting: General Surgery

## 2016-04-03 DIAGNOSIS — R633 Feeding difficulties, unspecified: Secondary | ICD-10-CM

## 2016-04-03 DIAGNOSIS — Z431 Encounter for attention to gastrostomy: Secondary | ICD-10-CM | POA: Diagnosis not present

## 2016-04-03 DIAGNOSIS — K9423 Gastrostomy malfunction: Secondary | ICD-10-CM | POA: Diagnosis not present

## 2016-04-03 HISTORY — PX: IR GENERIC HISTORICAL: IMG1180011

## 2016-04-03 MED ORDER — IOPAMIDOL (ISOVUE-300) INJECTION 61%
INTRAVENOUS | Status: AC
  Administered 2016-04-03: 20 mL
  Filled 2016-04-03: qty 50

## 2016-04-03 MED ORDER — IOPAMIDOL (ISOVUE-300) INJECTION 61%
50.0000 mL | Freq: Once | INTRAVENOUS | Status: AC | PRN
  Administered 2016-04-03: 20 mL

## 2016-04-03 NOTE — Procedures (Signed)
Appropriately positioned and functioning G-tube.    No exchange performed.  Ronny Bacon, MD Pager #: [phone]

## 2016-04-03 NOTE — Telephone Encounter (Signed)
Left message for patient to contact office for hospital follow up

## 2016-04-04 ENCOUNTER — Other Ambulatory Visit: Payer: Self-pay

## 2016-04-04 NOTE — Patient Outreach (Signed)
[facility]) Care Management  04/04/2016  BOLIVAR FIGURA 07/29/1950 [id number]   Received message from the niece of Ramiel Perniciaro.  She stated that the number listed as the patient's home number is her number, but that she has no contact with the patient.  She stated that her mother, W. Redmon, used to care for patient, but that she is now deceased.  The niece suggested contacting the patient's dau, Talbert Nan.  Plan: RN will contact Talbert Nan, but per HIPPA guidelines, will share no information with her due to lack of consent from patient.  Candie Mile, RN, MSN [place] [phone] Fax [phone]

## 2016-04-04 NOTE — Patient Outreach (Signed)
[facility]) Care Management  04/04/2016  BENET KITCHELL April 07, 1950 [id number]   Telephone to patient's daughter.  She reports she does not have POA to speak for the patient (He is non-verbal.)  However, she reports she could be at the patient's house and that she could communicate/relay his responses.  Appointment set up for 10AM tomorrow to get permission from patient to communicate with his daughter's assistance, and explain [facility] services to patient.  Candie Mile, RN, MSN [place] [phone] Fax [phone]

## 2016-04-05 ENCOUNTER — Other Ambulatory Visit: Payer: Self-pay

## 2016-04-05 NOTE — Patient Outreach (Signed)
[facility]) Care Management  04/05/2016  Nicholas Cobb [birth year] [id number]   Telephone call to patient's daughter as arranged yesterday.  She was to be at the patient's house to facilitate a screening call.  Patient is non-verbal due to tracheostomy.  Daughter reports her father is not at home this morning, that he used [employer] to go to an appointment.  Rescheduled screening call for 04-12-16 at [facility], based on daughter's work schedule and availability.  Candie Mile, RN, MSN [place] [phone] Fax [phone]

## 2016-04-08 ENCOUNTER — Encounter (HOSPITAL_COMMUNITY): Payer: Self-pay | Admitting: Emergency Medicine

## 2016-04-08 ENCOUNTER — Emergency Department (HOSPITAL_COMMUNITY)
Admission: EM | Admit: 2016-04-08 | Discharge: 2016-04-08 | Disposition: A | Discharge status: Home / Self Care | Payer: Medicare Other | Attending: Emergency Medicine | Admitting: Emergency Medicine

## 2016-04-08 DIAGNOSIS — Z79899 Other long term (current) drug therapy: Secondary | ICD-10-CM | POA: Diagnosis not present

## 2016-04-08 DIAGNOSIS — Z87891 Personal history of nicotine dependence: Secondary | ICD-10-CM | POA: Diagnosis not present

## 2016-04-08 DIAGNOSIS — R0602 Shortness of breath: Secondary | ICD-10-CM | POA: Diagnosis present

## 2016-04-08 DIAGNOSIS — Z8521 Personal history of malignant neoplasm of larynx: Secondary | ICD-10-CM | POA: Diagnosis not present

## 2016-04-08 DIAGNOSIS — E119 Type 2 diabetes mellitus without complications: Secondary | ICD-10-CM | POA: Insufficient documentation

## 2016-04-08 DIAGNOSIS — I10 Essential (primary) hypertension: Secondary | ICD-10-CM | POA: Insufficient documentation

## 2016-04-08 DIAGNOSIS — R0600 Dyspnea, unspecified: Secondary | ICD-10-CM | POA: Diagnosis not present

## 2016-04-08 LAB — BASIC METABOLIC PANEL
Anion gap: 8 (ref 5–15)
BUN: 12 mg/dL (ref 6–20)
CO2: 29 mmol/L (ref 22–32)
Calcium: 9.3 mg/dL (ref 8.9–10.3)
Chloride: 92 mmol/L — ABNORMAL LOW (ref 101–111)
Creatinine, Ser: 0.79 mg/dL (ref 0.61–1.24)
GFR calc Af Amer: >60 mL/min (ref >60)
GFR calc non Af Amer: >60 mL/min (ref >60)
Glucose, Bld: 96 mg/dL (ref 65–99)
Potassium: 4.2 mmol/L (ref 3.5–5.1)
Sodium: 129 mmol/L — ABNORMAL LOW (ref 135–145)

## 2016-04-08 LAB — CBC WITH DIFFERENTIAL/PLATELET
Basophils Absolute: 0 10*3/uL (ref 0.0–0.1)
Basophils Relative: 0 %
Eosinophils Absolute: 0.1 10*3/uL (ref 0.0–0.7)
Eosinophils Relative: 2 %
HCT: 38.4 % — ABNORMAL LOW (ref 39.0–52.0)
Hemoglobin: 13 g/dL (ref 13.0–17.0)
Lymphocytes Relative: 15 %
Lymphs Abs: 0.8 10*3/uL (ref 0.7–4.0)
MCH: 30.2 pg (ref 26.0–34.0)
MCHC: 33.9 g/dL (ref 30.0–36.0)
MCV: 89.1 fL (ref 78.0–100.0)
Monocytes Absolute: 1.4 10*3/uL — ABNORMAL HIGH (ref 0.1–1.0)
Monocytes Relative: 25 %
Neutro Abs: 3.4 10*3/uL (ref 1.7–7.7)
Neutrophils Relative %: 58 %
Platelets: 369 10*3/uL (ref 150–400)
RBC: 4.31 MIL/uL (ref 4.22–5.81)
RDW: 12.8 % (ref 11.5–15.5)
WBC: 5.7 10*3/uL (ref 4.0–10.5)

## 2016-04-08 LAB — BRAIN NATRIURETIC PEPTIDE: B Natriuretic Peptide: 5.8 pg/mL (ref 0.0–100.0)

## 2016-04-08 MED ORDER — ALBUTEROL SULFATE (2.5 MG/3ML) 0.083% IN NEBU
5.0000 mg | INHALATION_SOLUTION | Freq: Once | RESPIRATORY_TRACT | Status: AC
  Administered 2016-04-08: 5 mg via RESPIRATORY_TRACT
  Filled 2016-04-08: qty 6

## 2016-04-08 MED ORDER — IPRATROPIUM-ALBUTEROL 0.5-2.5 (3) MG/3ML IN SOLN
3.0000 mL | Freq: Four times a day (QID) | RESPIRATORY_TRACT | Status: DC
  Administered 2016-04-08: 3 mL via RESPIRATORY_TRACT
  Filled 2016-04-08: qty 3

## 2016-04-08 MED ORDER — ALBUTEROL SULFATE HFA 108 (90 BASE) MCG/ACT IN AERS
2.0000 | INHALATION_SPRAY | Freq: Once | RESPIRATORY_TRACT | Status: AC
  Administered 2016-04-08: 2 via RESPIRATORY_TRACT
  Filled 2016-04-08: qty 6.7

## 2016-04-08 NOTE — ED Provider Notes (Signed)
[facility] DEPT Provider Note   CSN: [id number] Arrival date & time: 04/08/16  1118  By signing my name below, I, Sonum Patel, attest that this documentation has been prepared under the direction and in the presence of Virgel Manifold, MD. Electronically Signed: Sonum Patel, Education administrator. 04/08/16. 1:44 PM.  History   Chief Complaint Chief Complaint  Patient presents with  . Shortness of Breath    The history is provided by the patient. No language interpreter was used.    LEVEL 5 CAVEAT: Patient nonverbal HPI Comments: Nicholas Cobb is a 66 y.o. male who presents to the Emergency Department complaining of 3 months of unchanged breathing problems with associated cough and wheezing that worsened recently. He has a history of asthma but needs another inhaler as well as a breathing treatment. He denies taking any medications at home prior to arrival. He has facial swelling but states this is normal for him since he had radiation treatment for throat cancer. He denies fever, pain.    Past Medical History:  Diagnosis Date  . Allergy   . Cancer ([place]) 1999   throat cancer  . Cervical spondylolysis 04/30/2011   severe  . Cervical spondylosis 03/25/2011  . Change in voice   . Chronic pain 03/25/2011  . Chronic sinusitis 04/27/2011  . Depression   . Difficulty urinating   . Emphysema 03/25/2011  . Essential hypertension, benign 12/03/2012  . GERD (gastroesophageal reflux disease) 03/25/2011  . Hearing loss   . Impaired glucose tolerance 04/27/2011  . 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 Rash   . Trouble swallowing   . Type II or unspecified type diabetes mellitus without mention of complication, uncontrolled 04/27/2011  . Ulcer ([place])   . UTI (lower urinary tract infection)   . Xerostomia 03/25/2011    Patient Active Problem List   Diagnosis Date Noted  . Tracheostomy care ([place])   . Hypoxemia   . Hemoptysis 01/26/2016  . Throat cancer ([place])   . Controlled diabetes mellitus type 2 with  complications ([place])   . Chronic respiratory failure ([place]) 06/14/2015  . Respiratory failure ([place]) 06/13/2015  . Tracheostomy dependence ([place])   . Cellulitis and abscess of head   . Cellulitis of face   . Tracheostomy status ([place])   . Acute sinus infection 10/29/2013  . Rash 10/29/2013  . Hyponatremia 12/03/2012  . Essential hypertension 12/03/2012  . Cervical spondylolysis 04/30/2011  . Chronic sinusitis 04/27/2011  . Diabetes 04/27/2011  . Erectile dysfunction 04/19/2011  . Preventative health care 03/25/2011  . Cervical spondylosis 03/25/2011  . Emphysema 03/25/2011  . GERD (gastroesophageal reflux disease) 03/25/2011  . Chronic pain 03/25/2011  . 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 Xerostomia 03/25/2011  . Tracheostomy in place [facility]) 02/04/2011  . Laryngeal cancer ([place]) 02/04/2011  . Encounter for PEG (percutaneous endoscopic gastrostomy) ([place]) 02/04/2011  . Attention to G-tube [facility]) 11/07/2010    Past Surgical History:  Procedure Laterality Date  . GASTROSTOMY TUBE PLACEMENT    . IR GENERIC HISTORICAL  04/03/2016   IR CM INJ ANY COLONIC TUBE W/FLUORO 04/03/2016 [name], [name]  . PEG TUBE PLACEMENT  11/1999  . TRACHEOSTOMY  11/1999       Home Medications    Prior to Admission medications   Medication Sig Start Date End Date Taking? Authorizing Provider  albuterol (PROVENTIL HFA;VENTOLIN HFA) 108 (90 Base) MCG/ACT inhaler Inhale 2 puffs into the lungs every 4 (four) hours as needed for wheezing or shortness of breath. 03/09/16  [name], NP  AMBULATORY NON FORMULARY MEDICATION Trach([place] 6.0 XLT UP uncuffed), [place], [place] holders, Disposable inner cannulas 07/08/15   Gildardo Cranker, DO  betamethasone dipropionate 0.05 % lotion Apply topically 2 (two) times daily. To rash on leg 10/20/15   Gildardo Cranker, DO  doxycycline (VIBRA-TABS) 100 MG tablet Take 1 tablet (100 mg total) by mouth 2 (two) times daily. 03/09/16   [name],  NP  ibuprofen (ADVIL,MOTRIN) 400 MG tablet take 1 tablet by mouth three times a day if needed for pain 01/05/16   Gildardo Cranker, DO  losartan (COZAAR) 100 MG tablet take 1 tablet by mouth once daily 01/19/16   Gildardo Cranker, DO  naphazoline-pheniramine (NAPHCON-A) 0.025-0.3 % ophthalmic solution Place 2 drops into both eyes 4 (four) times daily as needed for irritation (red eye).    Historical Provider, MD  Nutritional Supplements (FEEDING SUPPLEMENT, JEVITY 1.2 CAL,) LIQD Place 237 mLs into feeding tube See admin instructions. Pt takes 2 cans at breakfast, 2 cans at lunch, 2 cans at dinner and 2 at night 12/02/14   Gildardo Cranker, DO  oxymetazoline (12 HOUR NASAL SPRAY) 0.05 % nasal spray Place 2 sprays into the nose 2 (two) times daily as needed for congestion.     Historical Provider, MD  predniSONE (DELTASONE) 20 MG tablet 2 tablets daily for 6 days 03/09/16   [name], NP  ranitidine (ZANTAC) 150 MG tablet take 1 tablet by mouth twice a day 11/26/15   [name], NP  triamcinolone cream (KENALOG) 0.1 % Apply 1 application topically 2 (two) times daily. 07/02/15   Gildardo Cranker, DO    Family History Family History  Problem Relation Age of Onset  . Diabetes Mother   . Heart disease Father   . Stroke Other   . Diabetes Other   . Cancer Other     lung cancer    Social History Social History  Substance Use Topics  . Smoking status: Former Smoker    Packs/day: 1.00    Years: 23.00    Quit date: 11/06/1996  . Smokeless tobacco: Never Used  . Alcohol use No     Allergies   Oxybutynin chloride and Codeine   Review of Systems Review of Systems  Unable to perform ROS: Patient nonverbal     Physical Exam Updated Vital Signs BP (!) 152/108   Pulse 93   Temp 99.4 F (37.4 C) (Oral)   Resp (!) 27   SpO2 100%   Physical Exam  Constitutional: He is oriented to person, place, and time. He appears well-developed and well-nourished.  HENT:  Head: Normocephalic and  atraumatic.  Right facial swelling.  Eyes: EOM are normal.  Neck: Normal range of motion.  Cardiovascular: Normal rate, regular rhythm, normal heart sounds and intact distal pulses.   Pulmonary/Chest: Effort normal and breath sounds normal. Tachypnea noted. No respiratory distress.  Tracheostomy. Coarse breath sounds bilaterally. Mild tachypnea.   Abdominal: Soft. He exhibits no distension. There is no tenderness.  G tube.   Musculoskeletal: Normal range of motion. He exhibits edema.  Symmetric lower extremity edema.   Neurological: He is alert and oriented to person, place, and time.  Skin: Skin is warm and dry.  Psychiatric: He has a normal mood and affect. Judgment normal.  Nursing note and vitals reviewed.    ED Treatments / Results  DIAGNOSTIC STUDIES: Oxygen Saturation is 97% on RA, normal by my interpretation.    COORDINATION OF CARE: 1:28  PM Discussed treatment plan with pt at bedside and pt agreed to plan.   Labs (all labs ordered are listed, but only abnormal results are displayed) Labs Reviewed  CBC WITH DIFFERENTIAL/PLATELET - Abnormal; Notable for the following:       Result Value   HCT 38.4 (*)    Monocytes Absolute 1.4 (*)    All other components within normal limits  BASIC METABOLIC PANEL - Abnormal; Notable for the following:    Sodium 129 (*)    Chloride 92 (*)    All other components within normal limits  BRAIN NATRIURETIC PEPTIDE    EKG  EKG Interpretation None      66yM with dyspnea. Seems generally unhappy and says all he wants is an inhaler and to be suctioned. Refusing further treatments. Chronic illness. Doesn't seem acutely ill. It has been determined that no acute conditions requiring further emergency intervention are present at this time. The patient has been advised of the diagnosis and plan. I reviewed any labs and imaging including any potential incidental findings. We have discussed signs and symptoms that warrant return to the ED and  they are listed in the discharge instructions.   Radiology No results found.  Procedures Procedures (including critical care time)  Medications Ordered in ED Medications  albuterol (PROVENTIL) (2.5 MG/3ML) 0.083% nebulizer solution 5 mg (5 mg Nebulization Given 04/08/16 1220)  albuterol (PROVENTIL HFA;VENTOLIN HFA) 108 (90 Base) MCG/ACT inhaler 2 puff (2 puffs Inhalation Given 04/08/16 1624)     Initial Impression / Assessment and Plan / ED Course  I have reviewed the triage vital signs and the nursing notes.  Pertinent labs & imaging results that were available during my care of the patient were reviewed by me and considered in my medical decision making (see chart for details).     Final Clinical Impressions(s) / ED Diagnoses   Final diagnoses:  Dyspnea, unspecified type    New Prescriptions New Prescriptions   No medications on file   I personally preformed the services scribed in my presence. The recorded information has been reviewed is accurate. Virgel Manifold, MD.    Virgel Manifold, MD 04/24/16 [phone]

## 2016-04-08 NOTE — ED Notes (Signed)
Pt suctioned with 14.

## 2016-04-08 NOTE — ED Notes (Signed)
Pt refused to go to xray. States he just wants and inhaler and wants to go home. EDP made aware.

## 2016-04-08 NOTE — ED Triage Notes (Signed)
Pt c/o needing a breathing treatment. Pt presents with severe facial swelling but reports that it is due to previous radiation treatment. Pt communicates with pen and paper.

## 2016-04-08 NOTE — ED Notes (Signed)
Pt trach suctioned with 14. Pt writes on pad of paper to communicate; states his facial swelling is "normal for him" since December r/t  "a side effect from radiation." PEG tube noted on assessment.

## 2016-04-08 NOTE — ED Notes (Signed)
Pt suctioned with 14 french.

## 2016-04-08 NOTE — ED Notes (Signed)
Pt stated still short of breath, and tightness in chest. Notified Jessica(RN)

## 2016-04-08 NOTE — Progress Notes (Signed)
Late entry: Pt w/ trach- pt refuses to wear ATC.  On several request for pt to wear ATC, Pt adamantly states he does "not want humidity" on trach.  Pt states humidity "makes have to be suctioned more often".  Co-worker RT [employer] eval pt earlier w/ same results & pt refused to have trach suctioned earlier.  Pt did allow me to administer [facility] after lengthy discussion because pt states he "does not want humidity on his trach" but he agreed to take [facility] after explaining to pt multiple times that a HHN is medicine x 5 minutes and is not just humidity only.  RN aware.

## 2016-04-10 ENCOUNTER — Telehealth: Payer: Self-pay | Admitting: Nurse Practitioner

## 2016-04-10 ENCOUNTER — Ambulatory Visit: Payer: Medicare Other | Admitting: Nurse Practitioner

## 2016-04-10 NOTE — Telephone Encounter (Signed)
Per Sherrie Mustache, NP. Nicholas Cobb needs to be seen by the Pulmonary Specialist for the Wheezing and SOB. Told Nicholas Cobb, I would schedule an appointment and call him with the details. Says she contact his daughter at [phone]  Says he is also  having pain and swelling in his leg, he declined an appointment, he will go to [facility] or [facility]. Stated they will refer him to a doctor. I stressed to Nicholas Cobb, he should see his primary care before going to an ER. Again declined an appointment.  The Pulmonary appointment was scheduled for Tuesday, April 11, 2016 at 11:00am.  Spoke with [name], patient daughter. Says she make sure Nicholas Cobb is aware. Patient is not verbal because of other medical issue.  He communicated by writing notes. Marland Kitchen

## 2016-04-11 ENCOUNTER — Encounter: Payer: Self-pay | Admitting: Pulmonary Disease

## 2016-04-11 ENCOUNTER — Ambulatory Visit (INDEPENDENT_AMBULATORY_CARE_PROVIDER_SITE_OTHER): Payer: Medicare Other | Admitting: Pulmonary Disease

## 2016-04-11 VITALS — BP 154/82 | HR 82 | Ht 69.0 in | Wt 171.0 lb

## 2016-04-11 DIAGNOSIS — J45901 Unspecified asthma with (acute) exacerbation: Secondary | ICD-10-CM

## 2016-04-11 DIAGNOSIS — J438 Other emphysema: Secondary | ICD-10-CM | POA: Diagnosis not present

## 2016-04-11 MED ORDER — IPRATROPIUM-ALBUTEROL 0.5-2.5 (3) MG/3ML IN SOLN
3.0000 mL | RESPIRATORY_TRACT | 1 refills | Status: DC | PRN
Start: 2016-04-11 — End: 2016-08-01

## 2016-04-11 NOTE — Patient Instructions (Signed)
It was a pleasure taking care of you today!  We will order you a nebulizer machine. Start using DuoNeb 4x/day and see if it helps her with her breathing.  Please call the office your symptoms are getting worse despite the meds/antibiotics.    Return to clinic as scheduled.

## 2016-04-11 NOTE — Progress Notes (Signed)
Subjective:    Patient ID: Nicholas Cobb, male    DOB: 1950/11/06, 66 y.o.   MRN: [id number]  HPI Patient follows with our tracheostomy clinic with severe osteoradionecrosis of jaw. Currently followed at [facility] by ENT. He was last seen by Dr. Ashok Cordia in December 2017. Since that time, he has been to the emergency room a couple times because of dyspnea. He went to the ED start of February when his trache got dislodged. He went there yesterday because of SOB flareup. Chest x-ray done on 03/28/2016: No acute changes. Tracheostomy in place.  Patient has chronic tracheostomy. He has chronic mucus production through his tracheostomy, not necessarily increased. He has been more winded than usual the last 3 months. Recent wheezing. Significant smoking history but quit many many years ago. He is not on any maintenance medication. He is not on oxygen. He had asthma as a child but it has been stable. Denies history of COPD.  Review of Systems  Constitutional: Negative.   HENT: Positive for congestion.   Eyes: Negative.   Respiratory: Positive for cough, shortness of breath and wheezing.   Cardiovascular: Negative.   Gastrointestinal: Negative.   Endocrine: Negative.   Genitourinary: Negative.   Musculoskeletal: Negative.   Skin: Negative.   Allergic/Immunologic: Negative.   Neurological: Negative.   Hematological: Negative.   Psychiatric/Behavioral: Negative.   All other systems reviewed and are negative.      Objective:   Physical Exam  Vitals:  Vitals:   04/11/16 1114  BP: (!) 154/82  Pulse: 82  SpO2: 93%  Weight: 171 lb (77.6 kg)  Height: 5\' 9"  (1.753 m)    Constitutional/General:   Chronically ill, cachectic, has a tracheostomy in place. Not in distress.  Body mass index is 25.25 kg/m. Wt Readings from Last 3 Encounters:  04/11/16 171 lb (77.6 kg)  03/09/16 171 lb 9.6 oz (77.8 kg)  02/25/16 169 lb (76.7 kg)    Neck circumference:   HEENT:  Pupils equal and reactive to light and accommodation. Anicteric sclerae. Normal nasal mucosa.    Neck: Right jaw swollen and disfigured. No JVD, (-) LAD. (-) bruits appreciated. Tracheostomy in place.   Respiratory/Chest: Grossly normal chest. (-) deformity. (-) Accessory muscle use.  Symmetric expansion. (-) Tenderness on palpation.  Resonant on percussion.  Diminished BS on both lower lung zones. (-) wheezing, crackles, rhonchi (-) egophony Breath sounds were clear. I did not hear any wheezing or rhonchi.  Cardiovascular: Regular rate and  rhythm, heart sounds normal, no murmur or gallops, no peripheral edema  Gastrointestinal:  Normal bowel sounds. Soft, non-tender. No hepatosplenomegaly.  (-) masses.   Musculoskeletal:  Normal muscle tone. Normal gait.   Extremities: Grossly normal. (-) clubbing, cyanosis.  (-) edema  Skin: (-) rash,lesions seen.   Neurological/Psychiatric : alert, oriented to time, place, person. Normal mood and affect          Assessment & Plan:  Pulmonary emphysema ([place]) Patient with significant smoking history. Had asthma as a child. Patient with tracheostomy because of severe osteonecrosis of the jaw. Being followed by ENT.   Recent dyspnea with "wheezing" last 3 mos.  No increase in secretions. Has chronic mucus per tracheostomy. On my exam today, breath sounds were clear.  I will treat him as uncontrolled asthma/copd and go from there.  We will order a nebulizer machine. We will order DuoNeb 4 times a day. May also consider Pulmicort 0.5 mg twice a day. If breathing is not  better on the nebulizer meds, will consider workup for CAD/CHF or even DVT.     Patient will follow up in 4-6 weeks with Dr. Ashok Cordia or APP. Need to follow up whether the "dyspnea" gets better.   Monica Becton, MD 04/11/2016   12:01 PM Pulmonary and [place] Pager: [phone] Office: [phone], Fax: [phone]

## 2016-04-11 NOTE — Assessment & Plan Note (Signed)
Patient with significant smoking history. Had asthma as a child. Patient with tracheostomy because of severe osteonecrosis of the jaw. Being followed by ENT.   Recent dyspnea with "wheezing" last 3 mos.  No increase in secretions. Has chronic mucus per tracheostomy. On my exam today, breath sounds were clear.  I will treat him as uncontrolled asthma/copd and go from there.  We will order a nebulizer machine. We will order DuoNeb 4 times a day. May also consider Pulmicort 0.5 mg twice a day. If breathing is not better on the nebulizer meds, will consider workup for CAD/CHF or even DVT.

## 2016-04-12 ENCOUNTER — Other Ambulatory Visit: Payer: Self-pay

## 2016-04-12 NOTE — Patient Outreach (Signed)
[facility]) Care Management  04/12/2016  [name] 01-Dec-1950 [id number]  Completed screening call for patient referred due to frequent ED use.  Patient is non-verbal due to a tracheostomy, but indicated he had his daughter present to assist with the screening. HIPAA identifiers confirmed with assistance of his daughter. Patient has throat cancer, diabetes, HTN, and emphysema, in addition to other chronic health issues.  Screening call conducted with patient's daughter Talbert Nan, but patient was with her, listened to my comments, and asked his daughter to relay his communications to me.  Explanation of [facility] services provided.  Patient indicated he would like to have a nurse visit him at home for care coordination and case management.  Patient's daughter stated she is off work next week and would be available to be present when the nurse visits.  She tentatively requested a visit on Thursday, March 8th at 10 am.  She can be contacted at [phone] to confirm an appointment time.  Plan: Refer to community RN for home visit.  Candie Mile, RN, MSN [place] [phone] Fax [phone]

## 2016-04-13 DIAGNOSIS — C153 Malignant neoplasm of upper third of esophagus: Secondary | ICD-10-CM | POA: Diagnosis not present

## 2016-04-14 ENCOUNTER — Other Ambulatory Visit: Payer: Self-pay

## 2016-04-14 NOTE — Telephone Encounter (Signed)
lmomtcb x 2  

## 2016-04-14 NOTE — Patient Outreach (Signed)
   Unsuccessful telephone contact made with patient's daughter, Ms. Wooden. Ms. Delcie Roch agreed to meet this RNCM at patient's home later this month. Ms. Delcie Roch has been consented to speak on patient's behalf. Patient has trach in place.   Plan: Home visit later this month for community care coordination.

## 2016-04-17 DIAGNOSIS — C153 Malignant neoplasm of upper third of esophagus: Secondary | ICD-10-CM | POA: Diagnosis not present

## 2016-04-20 ENCOUNTER — Other Ambulatory Visit: Payer: Self-pay

## 2016-04-20 DIAGNOSIS — M8660 Other chronic osteomyelitis, unspecified site: Secondary | ICD-10-CM | POA: Diagnosis not present

## 2016-04-20 DIAGNOSIS — H9319 Tinnitus, unspecified ear: Secondary | ICD-10-CM | POA: Diagnosis not present

## 2016-04-20 DIAGNOSIS — C154 Malignant neoplasm of middle third of esophagus: Secondary | ICD-10-CM | POA: Diagnosis not present

## 2016-04-20 DIAGNOSIS — Z930 Tracheostomy status: Secondary | ICD-10-CM | POA: Diagnosis not present

## 2016-04-20 DIAGNOSIS — J4531 Mild persistent asthma with (acute) exacerbation: Secondary | ICD-10-CM | POA: Diagnosis not present

## 2016-04-20 DIAGNOSIS — R609 Edema, unspecified: Secondary | ICD-10-CM | POA: Diagnosis not present

## 2016-04-20 DIAGNOSIS — C153 Malignant neoplasm of upper third of esophagus: Secondary | ICD-10-CM | POA: Diagnosis not present

## 2016-04-21 DIAGNOSIS — J4531 Mild persistent asthma with (acute) exacerbation: Secondary | ICD-10-CM | POA: Diagnosis not present

## 2016-04-21 NOTE — Addendum Note (Signed)
Addended by: Tobi Bastos on: 04/21/2016 12:20 PM   Modules accepted: Orders

## 2016-04-21 NOTE — Patient Outreach (Signed)
[facility]) Care Management   04/21/2016  Nicholas Cobb [birth year] [id number]  Nicholas Cobb is an 66 y.o. male  Subjective:  Patient writes, "I would like to find another doctor, the one I have does not treat me good"  Objective:   ROS  Slim gentleman with a trach Patient communicates by writing information on pad  Physical Exam  Permanent trach intake  Encounter Medications:   Outpatient Encounter Prescriptions as of 04/20/2016  Medication Sig  . albuterol (PROVENTIL HFA;VENTOLIN HFA) 108 (90 Base) MCG/ACT inhaler Inhale 2 puffs into the lungs every 4 (four) hours as needed for wheezing or shortness of breath.  . AMBULATORY NON FORMULARY MEDICATION Trach(Shiley 6.0 XLT UP uncuffed), [place], Trach holders, Disposable inner cannulas  . betamethasone dipropionate 0.05 % lotion Apply topically 2 (two) times daily. To rash on leg  . doxycycline (VIBRA-TABS) 100 MG tablet Take 1 tablet (100 mg total) by mouth 2 (two) times daily.  Marland Kitchen ibuprofen (ADVIL,MOTRIN) 400 MG tablet take 1 tablet by mouth three times a day if needed for pain  . ipratropium-albuterol (DUONEB) 0.5-2.5 (3) MG/3ML SOLN Take 3 mLs by nebulization every 4 (four) hours as needed.  Marland Kitchen losartan (COZAAR) 100 MG tablet take 1 tablet by mouth once daily  . naphazoline-pheniramine (NAPHCON-A) 0.025-0.3 % ophthalmic solution Place 2 drops into both eyes 4 (four) times daily as needed for irritation (red eye).  . Nutritional Supplements (FEEDING SUPPLEMENT, JEVITY 1.2 CAL,) LIQD Place 237 mLs into feeding tube See admin instructions. Pt takes 2 cans at breakfast, 2 cans at lunch, 2 cans at dinner and 2 at night  . oxymetazoline (12 HOUR NASAL SPRAY) 0.05 % nasal spray Place 2 sprays into the nose 2 (two) times daily as needed for congestion.   . predniSONE (DELTASONE) 20 MG tablet 2 tablets daily for 6 days  . ranitidine (ZANTAC) 150 MG tablet take 1 tablet by mouth twice a day  . triamcinolone  cream (KENALOG) 0.1 % Apply 1 application topically 2 (two) times daily.   No facility-administered encounter medications on file as of 04/20/2016.     Functional Status:   In your present state of health, do you have any difficulty performing the following activities: 04/20/2016 06/14/2015  Hearing? N Y  Vision? Y N  Difficulty concentrating or making decisions? N N  Walking or climbing stairs? Y Y  Dressing or bathing? N N  Doing errands, shopping? Y N  Preparing Food and eating ? Y -  Using the Toilet? N -  In the past six months, have you accidently leaked urine? N -  Do you have problems with loss of bowel control? N -  Managing your Medications? N -  Managing your Finances? N -  Housekeeping or managing your Housekeeping? Y -  Some recent data might be hidden    Fall/Depression Screening:    PHQ 2/9 Scores 04/20/2016 01/06/2015 10/29/2013 12/03/2012  PHQ - 2 Score 0 0 1 1   THN CM Care Plan Problem One   Flowsheet Row Most Recent Value  Care Plan Problem One  I need a new doctor  Role Documenting the Problem One  Care Management [place] for Problem One  Active  THN Long Term Goal (31-90 days)  In the next 31 days, patient will have made an appointment with physician of his choice  THN Long Term Goal Start Date  04/20/16  Interventions for Problem One Long Term Goal  Initia  home visit to assess patient's community care coordination needs  THN CM Short Term Goal #1 (0-30 days)  In the next 21 days, patient will have reviewed list of providers meeting his criteria and selected final 2 to contact  [facility] CM Short Term Goal #1 Start Date  04/20/16  Interventions for Short Term Goal #1  list of primary care providers provided to patient. list obtained from [facility] Provider list and was based on patient's search criteria    [facility] CM Care Plan Problem Two   Flowsheet Row Most Recent Value  Care Plan Problem Two  patient can only communicate using written language due to  throat cancer  Role Documenting the Problem Two  Care Management [place] for Problem Two  Active  Interventions for Problem Two Long Term Goal   referral placed for [facility]N LCSW   THN Long Term Goal (31-90) days  in the next 31 days, patient will have a [facility] LCSW consult to assess community resources for communications devices  THN Long Term Goal Start Date  04/20/16    [facility] CM Care Plan Problem Three   Flowsheet Row Most Recent Value  Care Plan Problem Three  patient needs assistance iwth light housekeeping, however, his income levelis above medicaid eligbility for [facility] Program  Role Documenting the Problem Three  Care Management [place] for Problem Three  Active  THN Long Term Goal (31-90) days  In the next 31 days, patient will meet wtih [facility] LCSW to assess community resources for assistance with light housekeeping  THN Long Term Goal Start Date  04/21/16  Interventions for Problem Three Long Term Goal  [facility] LCSW referral made to assist with researching community resources.     Fall Risk  04/20/2016 03/09/2016 01/04/2016 10/20/2015 06/09/2015  Falls in the past year? Yes No No No No  Number falls in past yr: 2 or more - - - -  Injury with Fall? No - - - -  Risk Factor Category  High Fall Risk - - - -  Risk for fall due to : History of fall(s) - - - -  Follow up Education provided - - - -   Assessment:   Patient has trach, extensive throat tissue removed, has no voice box. Patient can only communicate through written communication. Patient lives alone, has tube feedings and nebulizers he is able to able to perform for himself. Patient has one daughter, Nicholas Cobb "[name]" who is very dedicated to him, however, she is many other obligations. Patient refuses assisted living placement. Patient receives his DME supplies from [place]. RNCM called [facility] to request visit from Respiratory to ensure patient is able to correctly administer his breathing treatments given the  extensive loss of tissues around trach. RNCM gave patlent list of primary care physicians in this area retrieved from [facility] site.  Patient stated he would take the list to the senior center with him Cobb he can look them up online, read about them before he makes a decision.  Plan:   Home visit in the next 28 days for community care coordination.

## 2016-04-24 ENCOUNTER — Encounter: Payer: Self-pay | Admitting: Licensed Clinical Social Worker

## 2016-04-24 ENCOUNTER — Other Ambulatory Visit: Payer: Self-pay | Admitting: Licensed Clinical Social Worker

## 2016-04-24 NOTE — Patient Outreach (Addendum)
[facility]) Care Management  04/24/2016  Nicholas Cobb Apr 28, 1950 [id number]  Assessment- CSW completed initial outreach to daughter on 04/24/16 after receiving new referral on 04/21/16. Patient was referred to [facility] for frequent ED use. Patient is non verbal due to a tracheostomy. CSW was able to reach daughter successfully. HIPPA verifications were received. She reports that patient needs assistance with light house cleaning, someone to assist with scheduling physician appointments and communication assistance device to improve his communication as patient can only communicate through writing down information. CSW introduced self, reason for call and of [facility] social work services. Family is agreeable to social work assistance. Patient has a chronic mucus production through his tracheostomy. He has experienced SOB more recently within the last few months. He has a significant smoking history but quit many many years ago. 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 Patient is not on oxygen at this time and had asthma as a child. Patient's daughter reports that patient does have stable transportation through [place]. CSW would like to schedule home visit but due to inclement weather daughter suggests scheduling later in the week.  CSW provided education on programs [facility] and [facility] to daughter. Daughter is agreeable to referrals. Daughter understands that both programs have wait list at this time.  CSW completed call to [facility] located in [place], [place] at [phone] and left a HIPPA compliant voice message requesting return call with program information.   CSW completed call to [facility] (number [phone]) in order to speak to a speech therapist in regards to gaining resources for trach. Office was closed today due to inclement weather.   Plan-CSW will follow up within one  week to schedule initial home visit. CSW will send involvement letter to PCP. CSW will await for return call from [facility].  [name], BSW, MSW, [email or web address] Phone: [phone] Fax: [phone]

## 2016-04-25 ENCOUNTER — Other Ambulatory Visit: Payer: Self-pay | Admitting: Licensed Clinical Social Worker

## 2016-04-25 NOTE — Patient Outreach (Addendum)
[facility] [facility]) Care Management  04/25/2016  VALERIAN JEWEL August 17, 1950 [id number]   Assessment- CSW completed additional outreach to [facility] and spoke to Page. She stated that unfortunately she would not be able to evaluate patient due to him having Medicare. She reports that they cause as a barrier for her to assist and evaluate him. However she reported that she works closely with a Andris Flurry who is a Social worker with their [place] and who could assist patient. She stated that if patient was to be working with their [employer] (which is through [facility]) then Alyce Pagan would be able to make a referral back to Page through [employer] and then complete an evaluation and determine which equipment would work best for him. She provided Josem Kaufmann number which is [phone]. She also stated that there is a Electrical engineer Ileene Hutchinson [phone]) that works for a company that sells communication devices that is able to do home visits and show different communication assistance devices to patient for the family to consider purchasing but that she does not do an evaluation. She also strongly encouraged that a HH order be completed for Speech Therapy.  CSW left a HIPPA compliant voice message with Andris Flurry requesting return call.   CSW sent in basket message to [place] in regards to suggestion for Speech Therapy. Plan-CSW will provide updates to family during home visit. CSW will schedule home visit within one week.  [name], BSW, MSW, [email or web address] Phone: [phone] Fax: [phone]

## 2016-04-26 ENCOUNTER — Other Ambulatory Visit: Payer: Self-pay | Admitting: Licensed Clinical Social Worker

## 2016-04-26 ENCOUNTER — Telehealth: Payer: Self-pay | Admitting: *Deleted

## 2016-04-26 DIAGNOSIS — C329 Malignant neoplasm of larynx, unspecified: Secondary | ICD-10-CM

## 2016-04-26 DIAGNOSIS — R4701 Aphasia: Secondary | ICD-10-CM

## 2016-04-26 DIAGNOSIS — R062 Wheezing: Secondary | ICD-10-CM

## 2016-04-26 NOTE — Patient Outreach (Signed)
[place]) Care Management  04/26/2016  ESPEN BETHEL 1950/11/27 [id number]   Assessment- CSW received return call from Andris Flurry with through the state Independent Living Program. She reports that their program assist people with chronic disabilities to gain medical equipment and resources. She shared that there are certain eligibility requirements that patient must meet. She stated that patient would more than likely qualify for the chronic conditions eligibility part but she is unsure if he will for the financial requirement. She reports that for a household of 1 member that they cannot make over $1,200 per month. Alyce Pagan stated that they will count medical deductions for total income as well. She shared that she would need 3 months of bank statements for patient to enroll in program as well. If patient was eligible for program then he would be able to be referred to [facility] and be evaluated and could be provided with a communication assistance device.   CSW completed call to daughter to schedule home visit for tomorrow on 04/27/16. She in unsure of patient's total monthly income.  Plan-CSW will complete home visit tomorrow and provide social work resource information that has been gathered this week.  [name], BSW, MSW, [email or web address] Phone: [phone] Fax: [phone]

## 2016-04-26 NOTE — Telephone Encounter (Signed)
Home Health Referral placed.

## 2016-04-26 NOTE — Telephone Encounter (Signed)
[name], Social Worker with [facility], called and stated that patient has A Lot of issues and unable to communicate with Trach. Nurse wants to know if you will place a referral for patient to get Home Health for Speech Therapy to Access patient stated that referral has to come from his PCP. Please Advise.

## 2016-04-26 NOTE — Patient Outreach (Signed)
[facility]) Care Management  04/26/2016  COLLIE WERNICK 10/30/1950 [id number]   Assessment- CSW completed call to patient's PCP office and left a message with nurse seeing if PCP would consider making HH order for speech therapy as several resource programs have encouraged CSW to do this.  Plan-CSW will complete home visit tomorrow and await to hear back from PCP office about request for speech therapy involvement.  [name], BSW, MSW, [email or web address] Phone: [phone] Fax: [phone]

## 2016-04-26 NOTE — Patient Outreach (Signed)
[facility]) Care Management  04/26/2016  Nicholas Cobb 1950-09-08 [id number]   Assessment- CSW received voice message from [place] with PCP office. She stated that referral for Home Health Speech Therapy was placed today.  Plan-CSW will complete home visit tomorrow and continue to provide social work assistance and support.  [name], BSW, MSW, [email or web address] Phone: [phone] Fax: [phone]

## 2016-04-26 NOTE — Telephone Encounter (Signed)
Okay to place order for ST. Thank you.

## 2016-04-27 ENCOUNTER — Ambulatory Visit: Payer: Self-pay | Admitting: Licensed Clinical Social Worker

## 2016-04-27 ENCOUNTER — Other Ambulatory Visit: Payer: Self-pay | Admitting: Licensed Clinical Social Worker

## 2016-04-27 ENCOUNTER — Ambulatory Visit: Payer: Self-pay

## 2016-04-27 NOTE — Patient Outreach (Signed)
[facility]) Care Management  04/27/2016  Nicholas Cobb 1950-03-14 [id number]  Assessment- CSW received incoming call from patient's daughter who stated that patient would need to cancel home visit today. CSW was able to reschedule home visit for tomorrow on 04/28/16.  Plan-CSW will complete home visit tomorrow.  [name], BSW, MSW, [email or web address] Phone: [phone] Fax: [phone]

## 2016-04-28 ENCOUNTER — Other Ambulatory Visit: Payer: Self-pay

## 2016-04-28 ENCOUNTER — Telehealth: Payer: Self-pay

## 2016-04-28 ENCOUNTER — Other Ambulatory Visit: Payer: Self-pay | Admitting: Licensed Clinical Social Worker

## 2016-04-28 NOTE — Telephone Encounter (Signed)
Kesia called to inform [name], NP that patient will see Speech Therapy on Wednesday 05/03/16 Juluis Rainier)  I called Kesia back to inform her that message was received and will be forwarded to [name], NP

## 2016-04-28 NOTE — Patient Outreach (Signed)
[place] [facility] Care Management   04/28/2016  Nicholas Cobb [birth year] [id number]  Nicholas Cobb is an 66 y.o. male  Subjective:   Objective:   ROS  Physical Exam  Encounter Medications:   Outpatient Encounter Prescriptions as of 04/28/2016  Medication Sig  . albuterol (PROVENTIL HFA;VENTOLIN HFA) 108 (90 Base) MCG/ACT inhaler Inhale 2 puffs into the lungs every 4 (four) hours as needed for wheezing or shortness of breath.  . AMBULATORY NON FORMULARY MEDICATION Trach(Shiley 6.0 XLT UP uncuffed), [place], Trach holders, Disposable inner cannulas  . betamethasone dipropionate 0.05 % lotion Apply topically 2 (two) times daily. To rash on leg  . doxycycline (VIBRA-TABS) 100 MG tablet Take 1 tablet (100 mg total) by mouth 2 (two) times daily.  Marland Kitchen ibuprofen (ADVIL,MOTRIN) 400 MG tablet take 1 tablet by mouth three times a day if needed for pain  . ipratropium-albuterol (DUONEB) 0.5-2.5 (3) MG/3ML SOLN Take 3 mLs by nebulization every 4 (four) hours as needed.  Marland Kitchen losartan (COZAAR) 100 MG tablet take 1 tablet by mouth once daily  . naphazoline-pheniramine (NAPHCON-A) 0.025-0.3 % ophthalmic solution Place 2 drops into both eyes 4 (four) times daily as needed for irritation (red eye).  . Nutritional Supplements (FEEDING SUPPLEMENT, JEVITY 1.2 CAL,) LIQD Place 237 mLs into feeding tube See admin instructions. Pt takes 2 cans at breakfast, 2 cans at lunch, 2 cans at dinner and 2 at night  . oxymetazoline (12 HOUR NASAL SPRAY) 0.05 % nasal spray Place 2 sprays into the nose 2 (two) times daily as needed for congestion.   . predniSONE (DELTASONE) 20 MG tablet 2 tablets daily for 6 days  . ranitidine (ZANTAC) 150 MG tablet take 1 tablet by mouth twice a day  . triamcinolone cream (KENALOG) 0.1 % Apply 1 application topically 2 (two) times daily.   No facility-administered encounter medications on file as of 04/28/2016.     Functional Status:   In your present state  of health, do you have any difficulty performing the following activities: 04/20/2016 06/14/2015  Hearing? N Y  Vision? Y N  Difficulty concentrating or making decisions? N N  Walking or climbing stairs? Y Y  Dressing or bathing? N N  Doing errands, shopping? Y N  Preparing Food and eating ? Y -  Using the Toilet? N -  In the past six months, have you accidently leaked urine? N -  Do you have problems with loss of bowel control? N -  Managing your Medications? N -  Managing your Finances? N -  Housekeeping or managing your Housekeeping? Y -  Some recent data might be hidden    Fall/Depression Screening:    PHQ 2/9 Scores 04/28/2016 04/20/2016 01/06/2015 10/29/2013 12/03/2012  PHQ - 2 Score 0 0 0 1 1    Assessment:    Plan:

## 2016-04-28 NOTE — Patient Outreach (Signed)
[facility] [facility]) Care Management  [facility] Social Work  04/28/2016  Nicholas Cobb 09-Jul-1950 [id number]   Encounter Medications:  Outpatient Encounter Prescriptions as of 04/28/2016  Medication Sig  . albuterol (PROVENTIL HFA;VENTOLIN HFA) 108 (90 Base) MCG/ACT inhaler Inhale 2 puffs into the lungs every 4 (four) hours as needed for wheezing or shortness of breath.  . AMBULATORY NON FORMULARY MEDICATION Trach(Shiley 6.0 XLT UP uncuffed), [place], Trach holders, Disposable inner cannulas  . betamethasone dipropionate 0.05 % lotion Apply topically 2 (two) times daily. To rash on leg  . doxycycline (VIBRA-TABS) 100 MG tablet Take 1 tablet (100 mg total) by mouth 2 (two) times daily.  Marland Kitchen ibuprofen (ADVIL,MOTRIN) 400 MG tablet take 1 tablet by mouth three times a day if needed for pain  . ipratropium-albuterol (DUONEB) 0.5-2.5 (3) MG/3ML SOLN Take 3 mLs by nebulization every 4 (four) hours as needed.  Marland Kitchen losartan (COZAAR) 100 MG tablet take 1 tablet by mouth once daily  . naphazoline-pheniramine (NAPHCON-A) 0.025-0.3 % ophthalmic solution Place 2 drops into both eyes 4 (four) times daily as needed for irritation (red eye).  . Nutritional Supplements (FEEDING SUPPLEMENT, JEVITY 1.2 CAL,) LIQD Place 237 mLs into feeding tube See admin instructions. Pt takes 2 cans at breakfast, 2 cans at lunch, 2 cans at dinner and 2 at night  . oxymetazoline (12 HOUR NASAL SPRAY) 0.05 % nasal spray Place 2 sprays into the nose 2 (two) times daily as needed for congestion.   . predniSONE (DELTASONE) 20 MG tablet 2 tablets daily for 6 days  . ranitidine (ZANTAC) 150 MG tablet take 1 tablet by mouth twice a day  . triamcinolone cream (KENALOG) 0.1 % Apply 1 application topically 2 (two) times daily.   No facility-administered encounter medications on file as of 04/28/2016.     Functional Status:  In your present state of health, do you have any difficulty performing the following activities:  04/20/2016 06/14/2015  Hearing? N Y  Vision? Y N  Difficulty concentrating or making decisions? N N  Walking or climbing stairs? Y Y  Dressing or bathing? N N  Doing errands, shopping? Y N  Preparing Food and eating ? Y -  Using the Toilet? N -  In the past six months, have you accidently leaked urine? N -  Do you have problems with loss of bowel control? N -  Managing your Medications? N -  Managing your Finances? N -  Housekeeping or managing your Housekeeping? Y -  Some recent data might be hidden    Fall/Depression Screening:  PHQ 2/9 Scores 04/28/2016 04/20/2016 01/06/2015 10/29/2013 12/03/2012  PHQ - 2 Score 0 0 0 1 1    Assessment: CSW completed joint home visit with [facility] RNCM on 04/28/16 at patient's residence. CSW educated patient on the [place] and what their program includes. CSW also informed patient that if he is agreeable to program and if he qualifies then they will make referral to [place] and [employer] Program in order to assist patient with gaining communication assistance device. Patient reports that his monthly income is $1, 81 which makes him eligible for the Independent Living Program. Patient was informed that he will need documents of 3 months of bank statements. He is agreeable to go to the bank tomorrow. CSW completed call to Andris Flurry and spoke to her in regards to referring patient to program. She reports that she will need to talk to a family member in order to complete  intake and then she will be able to schedule a home visit and complete assessment. Daughter asked that CSW send a text message to her with all of the information that was discussed on the phone today. CSW sent text message to her for her to refer back to. CSW contacted patient's daughter and provided updates listed above. Daughter is willing to contact Andris Flurry within one week and complete intake. She reports that she is currently at work and cannot do it today. She is very  Patent attorney of social work assistance. CSW provided patient a handout on programs with information.  CSW also reviewed Development worker, community, Financial controller and Socialization Resources with patient and provided patient with a copy of this information. Patient's main focus at this time is to gain an communication assistance device.  CSW informed patient that a HH order was placed for Speech Therapy. He expressed appreciation.   Plan: CSW will follow up within one-two weeks and will continue to provide social work assistance and support.  [name], BSW, MSW, [email or web address] Phone: [phone] Fax: [phone]

## 2016-05-01 ENCOUNTER — Other Ambulatory Visit (HOSPITAL_COMMUNITY): Payer: Medicare Other

## 2016-05-01 ENCOUNTER — Other Ambulatory Visit: Payer: Self-pay

## 2016-05-01 ENCOUNTER — Other Ambulatory Visit: Payer: Self-pay | Admitting: Licensed Clinical Social Worker

## 2016-05-01 NOTE — Patient Outreach (Addendum)
[facility]) Care Management  05/01/2016  SAMVEL ZINN [birth year] [id number]   Assessment-CSW completed outreach attempt today to daughter to ensure that daughter completed intake for patient with the Independent Living Program. CSW unable to reach patient's daughter successfully and the voice mail box was full. CSW sent a HIPPA compliant text message to daughter encouraging her to complete intake and provided information to her. CSW completed chart review and saw that speech therapy will start 05/03/16. CSW and [facility] RNCM spoke today on 05/01/16 and discussed case. Patient's medical equipment was able to be fixed today.   Plan-CSW will await return call or complete an additional outreach if needed within one week.  [name], BSW, MSW, [email or web address] Phone: [phone] Fax: [phone]

## 2016-05-02 ENCOUNTER — Other Ambulatory Visit: Payer: Self-pay | Admitting: Licensed Clinical Social Worker

## 2016-05-02 NOTE — Patient Outreach (Signed)
[facility]) Care Management  05/02/2016  Nicholas Cobb 07/06/1950 [id number]   Assessment-CSW completed outreach attempt today to daughter. CSW unable to reach patient's daughter successfully. CSW left a HIPPA compliant voice message encouraging patient's daughter to return call once available and to complete intake over the phone for the [place].  Plan-CSW will await return call or complete an additional outreach if needed within one week.  [name], BSW, MSW, [email or web address] Phone: [phone] Fax: [phone]

## 2016-05-02 NOTE — Patient Outreach (Signed)
    This RNCM met with patient and delivery driver for [place]. Delivery driver was able to assist patient with putting the nebulizer together. Patient was able to demonstrate use of the machine.  Patient advised this RNCM by witting, he has not made a selection of primary medical providers.  Plan: Meet with patient within the next 21 days for further assessment of community care coordination needs

## 2016-05-04 ENCOUNTER — Ambulatory Visit: Payer: Medicare Other | Admitting: Nurse Practitioner

## 2016-05-05 ENCOUNTER — Emergency Department (HOSPITAL_COMMUNITY)
Admission: EM | Admit: 2016-05-05 | Discharge: 2016-05-05 | Disposition: A | Discharge status: Home / Self Care | Payer: Medicare Other | Attending: Emergency Medicine | Admitting: Emergency Medicine

## 2016-05-05 ENCOUNTER — Other Ambulatory Visit: Payer: Self-pay | Admitting: Licensed Clinical Social Worker

## 2016-05-05 ENCOUNTER — Encounter (HOSPITAL_COMMUNITY): Payer: Self-pay | Admitting: Emergency Medicine

## 2016-05-05 ENCOUNTER — Emergency Department (HOSPITAL_COMMUNITY): Payer: Medicare Other

## 2016-05-05 DIAGNOSIS — I10 Essential (primary) hypertension: Secondary | ICD-10-CM | POA: Diagnosis not present

## 2016-05-05 DIAGNOSIS — Z85818 Personal history of malignant neoplasm of other sites of lip, oral cavity, and pharynx: Secondary | ICD-10-CM | POA: Diagnosis not present

## 2016-05-05 DIAGNOSIS — Z87891 Personal history of nicotine dependence: Secondary | ICD-10-CM | POA: Insufficient documentation

## 2016-05-05 DIAGNOSIS — R0602 Shortness of breath: Secondary | ICD-10-CM

## 2016-05-05 DIAGNOSIS — E119 Type 2 diabetes mellitus without complications: Secondary | ICD-10-CM | POA: Diagnosis not present

## 2016-05-05 DIAGNOSIS — Z79899 Other long term (current) drug therapy: Secondary | ICD-10-CM | POA: Diagnosis not present

## 2016-05-05 DIAGNOSIS — J9509 Other tracheostomy complication: Secondary | ICD-10-CM | POA: Insufficient documentation

## 2016-05-05 NOTE — Progress Notes (Signed)
Called by RN to suction pt. Pt is clear/diminished in all lung fields. He is in no distress and resting comfortably. Pt would not let me suction him because he needed to use the restroom.

## 2016-05-05 NOTE — ED Notes (Signed)
Respiratory called to suction per Will, PA

## 2016-05-05 NOTE — ED Notes (Signed)
Patient refused to sit on bed for safety. Chair with arms provided. Patient nonverbal.Communicating via writing on tablet.

## 2016-05-05 NOTE — ED Triage Notes (Signed)
Pt lives at home alone. Pt reports his trach suction machine has stopped working at 4:30 this afternoon. Pt feeling SOB. No apparent distress in triage. Sats. 98% on room air. Pt writing notes to staff to communicate and states he needs to have his trach suctioned.

## 2016-05-05 NOTE — ED Notes (Signed)
Pt standing in doorway, continues to disconnect from monitor

## 2016-05-05 NOTE — Patient Outreach (Signed)
[facility]) Care Management  05/05/2016  AARYN SERMON 06-Jul-1950 [id number]  Assessment-CSW completed outreach attempt today to patient's daughter to see if she had completed intake referral to [facility]. CSW unable to reach patient's daughter successfully. CSW left a HIPPA compliant voice message encouraging patient to return call once available.  Plan-CSW will await return call or complete an additional outreach if needed within one-two weeks.   [name], BSW, MSW, [email or web address] Phone: [phone] Fax: [phone]

## 2016-05-05 NOTE — ED Provider Notes (Signed)
[facility] DEPT Provider Note   CSN: [id number] Arrival date & time: 05/05/16  [place]     History   Chief Complaint Chief Complaint  Patient presents with  . Shortness of Breath    HPI Nicholas Cobb is a 66 y.o. male.  Level V caveat: Patient is non-verbal.   Nicholas Cobb is a 66 y.o. Male with a history of a tracheostomy who presents to the ED because his suction machine has broken today. Patient reports around 4:30 PM today his suctioning machine stopped working. He has been unable to suction himself this evening. Patient has been unable to take his evening medications due to this. He requires suctioning multiple times a day. He tells me that the medical supplier [employer] does not open until 3 days from now. He needs suctioning multiple times a day and will not be able to go without this. He denies any other complaints. He rates down answers to my questions as he is nonverbal. He denies any recent illness, fevers or other complaints. He tells me he is just here for suctioning and help with getting a new suction machine.   The history is provided by the patient and medical records. The history is limited by a language barrier. No language interpreter was used.  Shortness of Breath  Pertinent negatives include no fever, no chest pain, no vomiting and no rash.    Past Medical History:  Diagnosis Date  . Allergy   . Cancer ([place]) 1999   throat cancer  . Cervical spondylolysis 04/30/2011   severe  . Cervical spondylosis 03/25/2011  . Change in voice   . Chronic pain 03/25/2011  . Chronic sinusitis 04/27/2011  . Depression   . Difficulty urinating   . Emphysema 03/25/2011  . Essential hypertension, benign 12/03/2012  . GERD (gastroesophageal reflux disease) 03/25/2011  . Hearing loss   . Impaired glucose tolerance 04/27/2011  . 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 Rash   . Trouble swallowing   . Type II or unspecified type diabetes mellitus without mention of complication,  uncontrolled 04/27/2011  . Ulcer ([place])   . UTI (lower urinary tract infection)   . Xerostomia 03/25/2011    Patient Active Problem List   Diagnosis Date Noted  . Tracheostomy care ([place])   . Hypoxemia   . Hemoptysis 01/26/2016  . Throat cancer ([place])   . Controlled diabetes mellitus type 2 with complications ([place])   . Chronic respiratory failure ([place]) 06/14/2015  . Respiratory failure ([place]) 06/13/2015  . Tracheostomy dependence ([place])   . Cellulitis and abscess of head   . Cellulitis of face   . Tracheostomy status ([place])   . Acute sinus infection 10/29/2013  . Rash 10/29/2013  . Hyponatremia 12/03/2012  . Essential hypertension 12/03/2012  . Cervical spondylolysis 04/30/2011  . Chronic sinusitis 04/27/2011  . Diabetes 04/27/2011  . Erectile dysfunction 04/19/2011  . Preventative health care 03/25/2011  . Cervical spondylosis 03/25/2011  . Pulmonary emphysema ([place]) 03/25/2011  . GERD (gastroesophageal reflux disease) 03/25/2011  . Chronic pain 03/25/2011  . 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 Xerostomia 03/25/2011  . Tracheostomy in place [facility]) 02/04/2011  . Laryngeal cancer ([place]) 02/04/2011  . Encounter for PEG (percutaneous endoscopic gastrostomy) ([place]) 02/04/2011  . Attention to G-tube [facility]) 11/07/2010    Past Surgical History:  Procedure Laterality Date  . GASTROSTOMY TUBE PLACEMENT    . IR GENERIC HISTORICAL  04/03/2016   IR CM INJ ANY COLONIC TUBE W/FLUORO 04/03/2016 [name], MD  [facility]-INTERV RAD  . PEG TUBE PLACEMENT  11/1999  . TRACHEOSTOMY  11/1999       Home Medications    Prior to Admission medications   Medication Sig Start Date End Date Taking? Authorizing Provider  albuterol (PROVENTIL HFA;VENTOLIN HFA) 108 (90 Base) MCG/ACT inhaler Inhale 2 puffs into the lungs every 4 (four) hours as needed for wheezing or shortness of breath. 03/09/16   [name], NP  AMBULATORY NON FORMULARY MEDICATION Trach(Shiley 6.0 XLT UP uncuffed), [place], [place] holders, Disposable inner cannulas 07/08/15   Gildardo Cranker, DO  betamethasone dipropionate 0.05 % lotion Apply topically 2 (two) times daily. To rash on leg 10/20/15   Gildardo Cranker, DO  doxycycline (VIBRA-TABS) 100 MG tablet Take 1 tablet (100 mg total) by mouth 2 (two) times daily. 03/09/16   [name], NP  ibuprofen (ADVIL,MOTRIN) 400 MG tablet take 1 tablet by mouth three times a day if needed for pain 01/05/16   Gildardo Cranker, DO  ipratropium-albuterol (DUONEB) 0.5-2.5 (3) MG/3ML SOLN Take 3 mLs by nebulization every 4 (four) hours as needed. 04/11/16   [place], MD  losartan (COZAAR) 100 MG tablet take 1 tablet by mouth once daily 01/19/16   Gildardo Cranker, DO  naphazoline-pheniramine (NAPHCON-A) 0.025-0.3 % ophthalmic solution Place 2 drops into both eyes 4 (four) times daily as needed for irritation (red eye).    Historical Provider, MD  Nutritional Supplements (FEEDING SUPPLEMENT, JEVITY 1.2 CAL,) LIQD Place 237 mLs into feeding tube See admin instructions. Pt takes 2 cans at breakfast, 2 cans at lunch, 2 cans at dinner and 2 at night 12/02/14   Gildardo Cranker, DO  oxymetazoline (12 HOUR NASAL SPRAY) 0.05 % nasal spray Place 2 sprays into the nose 2 (two) times daily as needed for congestion.     Historical Provider, MD  predniSONE (DELTASONE) 20 MG tablet 2 tablets daily for 6 days 03/09/16   [name], NP  ranitidine (ZANTAC) 150 MG tablet take 1 tablet by mouth twice a day 11/26/15   [name], NP  triamcinolone cream (KENALOG) 0.1 % Apply 1 application topically 2 (two) times daily. 07/02/15   Gildardo Cranker, DO    Family History Family History  Problem Relation Age of Onset  . Diabetes Mother   . Heart disease Father   . Stroke Other   . Diabetes Other   . Cancer Other     lung cancer    Social History Social History  Substance Use Topics  . Smoking status: Former Smoker    Packs/day: 1.00    Years: 23.00    Quit date: 11/06/1996  .  Smokeless tobacco: Never Used  . Alcohol use No     Allergies   Oxybutynin chloride and Codeine   Review of Systems Review of Systems  Unable to perform ROS: Patient nonverbal  Constitutional: Negative for fever.  Respiratory: Positive for shortness of breath.   Cardiovascular: Negative for chest pain.  Gastrointestinal: Negative for vomiting.  Skin: Negative for rash.     Physical Exam Updated Vital Signs BP (!) 145/99   Pulse 74   Temp 99.1 F (37.3 C) (Oral)   Resp (!) 22   SpO2 97%   Physical Exam  Constitutional: He appears well-developed and well-nourished. No distress.  Nontoxic appearing.  HENT:  Head: Atraumatic.  Chronic facial swelling noted.   Eyes: Conjunctivae are normal. Pupils are equal, round, and reactive to light. Right eye exhibits  no discharge. Left eye exhibits no discharge.  Neck: Neck supple.  Trach in place.  Cardiovascular: Normal rate, regular rhythm, normal heart sounds and intact distal pulses.   Pulmonary/Chest: Effort normal. No respiratory distress. He has wheezes. He has no rales.  Scattered wheezes noted bilaterally. No increased work of breathing.   Abdominal: Soft. There is no tenderness. There is no guarding.  Lymphadenopathy:    He has no cervical adenopathy.  Neurological: He is alert. Coordination normal.  Skin: Skin is warm and dry. Capillary refill takes less than 2 seconds. No rash noted. He is not diaphoretic. No erythema. No pallor.  Psychiatric: He has a normal mood and affect. His behavior is normal.  Nursing note and vitals reviewed.    ED Treatments / Results  Labs (all labs ordered are listed, but only abnormal results are displayed) Labs Reviewed - No data to display  EKG  EKG Interpretation  Date/Time:  Friday May 05 2016 18:54:28 EDT Ventricular Rate:  87 PR Interval:  132 QRS Duration: 72 QT Interval:  356 QTC Calculation: 428 R Axis:   102 Text Interpretation:  Normal sinus rhythm Rightward axis  Borderline ECG since last tracing no significant change Confirmed by BELFI  MD, MELANIE ([id number]) on 05/05/2016 8:14:24 PM       Radiology No results found.  Procedures Procedures (including critical care time)  Medications Ordered in ED Medications - No data to display   Initial Impression / Assessment and Plan / ED Course  I have reviewed the triage vital signs and the nursing notes.  Pertinent labs & imaging results that were available during my care of the patient were reviewed by me and considered in my medical decision making (see chart for details).    This  is a 66 y.o. Male with a history of a tracheostomy who presents to the ED because his suction machine has broken today. Patient reports around 4:30 PM today his suctioning machine stopped working. He has been unable to suction himself this evening. Patient has been unable to take his evening medications due to this. He requires suctioning multiple times a day. He tells me that the medical supplier [employer] does not open until 3 days from now. He needs suctioning multiple times a day and will not be able to go without this. He denies any other complaints. On exam the patient is afebrile nontoxic appearing. Tracheostomy is in place. He has no increased work of breathing. He has slight scattered wheezes noted bilaterally. He is requesting to be suction. Respiratory therapy was called for suctioning. I called [facility] and had the on-call technician paged by the answering service. On-call technician [name] return my call from [place]. He tells me he can come by his house and about an hour to troubleshoot his suction device and ensure it is working properly. Reevaluation patient has had suctioning performed. He reports feeling better. I explained to him this plan and he agrees with plan for discharge and for [employer] to come by his house. Will discharge at this time. I advised the patient to follow-up with their primary care provider this  week. I advised the patient to return to the emergency department with new or worsening symptoms or new concerns. The patient verbalized understanding and agreement with plan.     Final Clinical Impressions(s) / ED Diagnoses   Final diagnoses:  Other tracheostomy complication (HCC)  Shortness of breath    New Prescriptions New Prescriptions   No medications on file  Waynetta Pean, PA-C 05/05/16 2055    Malvin Johns, MD 05/05/16 2221

## 2016-05-07 ENCOUNTER — Other Ambulatory Visit: Payer: Self-pay | Admitting: Internal Medicine

## 2016-05-08 ENCOUNTER — Other Ambulatory Visit: Payer: Self-pay | Admitting: Licensed Clinical Social Worker

## 2016-05-08 DIAGNOSIS — C153 Malignant neoplasm of upper third of esophagus: Secondary | ICD-10-CM | POA: Diagnosis not present

## 2016-05-08 DIAGNOSIS — H9319 Tinnitus, unspecified ear: Secondary | ICD-10-CM | POA: Diagnosis not present

## 2016-05-08 DIAGNOSIS — C154 Malignant neoplasm of middle third of esophagus: Secondary | ICD-10-CM | POA: Diagnosis not present

## 2016-05-08 NOTE — Patient Outreach (Signed)
[facility]) Care Management  05/08/2016  Nicholas Cobb February 10, 1951 [id number]   Assessment- Patient presented to the ED on 05/05/16 and reported that his trach suction machine had stopped working that afternoon and he was experiencing SOB. [employer] was contacted.  Plan-CSW will follow up within two weeks. CSW has been unable to maintain contact with daughter.  [name], BSW, MSW, [email or web address] Phone: [phone] Fax: [phone]

## 2016-05-09 ENCOUNTER — Other Ambulatory Visit: Payer: Self-pay

## 2016-05-09 ENCOUNTER — Other Ambulatory Visit: Payer: Self-pay | Admitting: Licensed Clinical Social Worker

## 2016-05-09 ENCOUNTER — Other Ambulatory Visit: Payer: Self-pay | Admitting: Internal Medicine

## 2016-05-09 NOTE — Patient Outreach (Signed)
[facility] [facility]) Care Management  05/09/2016  THOMSON HERBERS 1950-12-26 [id number]   Assessment- CSW has been unable to reach patient's daughter by phone in order to follow up on the Independent Living Program intake. CSW sent HIPPA compliant text message to see if this form of communication will be better for family.  Plan-CSW will await to hear back from patient's daughter and follow up on program intake.  [name], BSW, MSW, [email or web address] Phone: [phone] Fax: [phone]

## 2016-05-10 NOTE — Patient Outreach (Signed)
[facility]) Care Management  05/10/2016   Nicholas Cobb 1951/02/02 [id number]  Subjective:  Patient communicates by witting.  Wrote:  "can you make an appointment for me with this new doctor I want to go to.  His name is [name], MD on [facility].  Objective:  Tall slim gentleman, well groomed, face swollen (patientwrites it is normally that way)   Current Medications:  Current Outpatient Prescriptions  Medication Sig Dispense Refill  . albuterol (PROVENTIL HFA;VENTOLIN HFA) 108 (90 Base) MCG/ACT inhaler Inhale 2 puffs into the lungs every 4 (four) hours as needed for wheezing or shortness of breath. 1 Inhaler 0  . AMBULATORY NON FORMULARY MEDICATION Trach(Shiley 6.0 XLT UP uncuffed), Trach Care Kit, Trach holders, Disposable inner cannulas 1 each 0  . betamethasone dipropionate 0.05 % lotion Apply topically 2 (two) times daily. To rash on leg 60 mL 2  . doxycycline (VIBRA-TABS) 100 MG tablet Take 1 tablet (100 mg total) by mouth 2 (two) times daily. 14 tablet 0  . ibuprofen (ADVIL,MOTRIN) 400 MG tablet take 1 tablet by mouth three times a day if needed for pain 90 tablet 3  . ibuprofen (ADVIL,MOTRIN) 400 MG tablet take 1 tablet by mouth three times a day if needed for pain 90 tablet 3  . ipratropium-albuterol (DUONEB) 0.5-2.5 (3) MG/3ML SOLN Take 3 mLs by nebulization every 4 (four) hours as needed. 360 mL 1  . losartan (COZAAR) 100 MG tablet take 1 tablet by mouth once daily 30 tablet 5  . naphazoline-pheniramine (NAPHCON-A) 0.025-0.3 % ophthalmic solution Place 2 drops into both eyes 4 (four) times daily as needed for irritation (red eye).    . Nutritional Supplements (FEEDING SUPPLEMENT, JEVITY 1.2 CAL,) LIQD Place 237 mLs into feeding tube See admin instructions. Pt takes 2 cans at breakfast, 2 cans at lunch, 2 cans at dinner and 2 at night 56880 mL 11  . oxymetazoline (12 HOUR NASAL SPRAY) 0.05 % nasal spray Place 2 sprays into the nose 2 (two)  times daily as needed for congestion.     . predniSONE (DELTASONE) 20 MG tablet 2 tablets daily for 6 days 12 tablet 0  . ranitidine (ZANTAC) 150 MG tablet take 1 tablet by mouth twice a day 180 tablet 1  . triamcinolone cream (KENALOG) 0.1 % Apply 1 application topically 2 (two) times daily. 45 g 5   No current facility-administered medications for this visit.     Functional Status:  In your present state of health, do you have any difficulty performing the following activities: 04/20/2016 06/14/2015  Hearing? N Y  Vision? Y N  Difficulty concentrating or making decisions? N N  Walking or climbing stairs? Y Y  Dressing or bathing? N N  Doing errands, shopping? Y N  Preparing Food and eating ? Y -  Using the Toilet? N -  In the past six months, have you accidently leaked urine? N -  Do you have problems with loss of bowel control? N -  Managing your Medications? N -  Managing your Finances? N -  Housekeeping or managing your Housekeeping? Y -  Some recent data might be hidden    Fall/Depression Screening: PHQ 2/9 Scores 04/28/2016 04/20/2016 01/06/2015 10/29/2013 12/03/2012  PHQ - 2 Score 0 0 0 1 1     Assessment:  Call made as patient requested to schedule an appointment for initial office visit. Call also made to [facility] to reorder Enteral Tube Feedings and supplies.  Plan:  Home visit in the next 30 days for further assessment of community care coordination, assessment of needs.

## 2016-05-11 ENCOUNTER — Encounter (HOSPITAL_COMMUNITY): Payer: Self-pay

## 2016-05-11 ENCOUNTER — Encounter: Payer: Self-pay | Admitting: Nurse Practitioner

## 2016-05-11 ENCOUNTER — Emergency Department (HOSPITAL_COMMUNITY)
Admission: EM | Admit: 2016-05-11 | Discharge: 2016-05-11 | Disposition: A | Discharge status: Home / Self Care | Payer: Medicare Other | Attending: Emergency Medicine | Admitting: Emergency Medicine

## 2016-05-11 ENCOUNTER — Other Ambulatory Visit: Payer: Self-pay | Admitting: Licensed Clinical Social Worker

## 2016-05-11 DIAGNOSIS — J9509 Other tracheostomy complication: Secondary | ICD-10-CM | POA: Diagnosis not present

## 2016-05-11 DIAGNOSIS — E119 Type 2 diabetes mellitus without complications: Secondary | ICD-10-CM | POA: Diagnosis not present

## 2016-05-11 DIAGNOSIS — R109 Unspecified abdominal pain: Secondary | ICD-10-CM | POA: Diagnosis not present

## 2016-05-11 DIAGNOSIS — Z79899 Other long term (current) drug therapy: Secondary | ICD-10-CM | POA: Insufficient documentation

## 2016-05-11 DIAGNOSIS — Z87891 Personal history of nicotine dependence: Secondary | ICD-10-CM | POA: Diagnosis not present

## 2016-05-11 DIAGNOSIS — I10 Essential (primary) hypertension: Secondary | ICD-10-CM | POA: Diagnosis not present

## 2016-05-11 DIAGNOSIS — Z85818 Personal history of malignant neoplasm of other sites of lip, oral cavity, and pharynx: Secondary | ICD-10-CM | POA: Diagnosis not present

## 2016-05-11 NOTE — Patient Outreach (Signed)
[facility]) Care Management  05/11/2016  Nicholas Cobb 04-04-1950 [id number]   Assessment- Per chart review, patient went to the ED with trach issues today on 05/11/16.  The patient states that his tracheostomy needs to be suctioned.  He states his home suction unit and is not working appropriately. CSW has been unable to maintain contact with family but will follow up with patient once discharged.  Plan-CSW will make outreach within 48 hours of discharge.  [name], BSW, MSW, [email or web address] Phone: [phone] Fax: [phone]

## 2016-05-11 NOTE — Discharge Instructions (Signed)
Follow-up with the trach clinic.  Return here as needed.  [facility] will come out to her house today at 1 PM to evaluate your suction device

## 2016-05-11 NOTE — Discharge Planning (Signed)
Pt states trach suction machine in need of repair.  [place] contacted [place], [name] who states he repaired it on Friday 3/23 and it was in working order.  [name] states that he can return TODAY at 1:00pm- if the pt is not available, he will need to take it to [place] office for repair.  Information relayed to [place], [place] and RN caring for pt.

## 2016-05-11 NOTE — ED Triage Notes (Addendum)
Pt is here from home reporting his suction machine stopped working on Friday. He reports he last suctioned his trach around midnight. Pt appears tachypnic but no distress noted. SPO2 is 96% in triage. Pt communicating by writing notes.

## 2016-05-11 NOTE — ED Provider Notes (Signed)
[facility] DEPT Provider Note   CSN: [id number] Arrival date & time: 05/11/16  0867     History   Chief Complaint Chief Complaint  Patient presents with  . Tracheostomy Tube Change    Suction    HPI Nicholas Cobb is a 66 y.o. male.  HPI Patient presents to the emergency department with trach issues.  The patient states that his tracheostomy needs to be suctioned.  He states his home suction unit and is not working appropriately curious, but when he needed his trach changed.  Patient has no other complaints.  He has no shortness breath, no chest pain, fever, cough, headache, blurred vision, or syncope Past Medical History:  Diagnosis Date  . Allergy   . Cancer ([place]) 1999   throat cancer  . Cervical spondylolysis 04/30/2011   severe  . Cervical spondylosis 03/25/2011  . Change in voice   . Chronic pain 03/25/2011  . Chronic sinusitis 04/27/2011  . Depression   . Difficulty urinating   . Emphysema 03/25/2011  . Essential hypertension, benign 12/03/2012  . GERD (gastroesophageal reflux disease) 03/25/2011  . Hearing loss   . Impaired glucose tolerance 04/27/2011  . 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 Rash   . Trouble swallowing   . Type II or unspecified type diabetes mellitus without mention of complication, uncontrolled 04/27/2011  . Ulcer ([place])   . UTI (lower urinary tract infection)   . Xerostomia 03/25/2011    Patient Active Problem List   Diagnosis Date Noted  . Tracheostomy care ([place])   . Hypoxemia   . Hemoptysis 01/26/2016  . Throat cancer ([place])   . Controlled diabetes mellitus type 2 with complications ([place])   . Chronic respiratory failure ([place]) 06/14/2015  . Respiratory failure ([place]) 06/13/2015  . Tracheostomy dependence ([place])   . Cellulitis and abscess of head   . Cellulitis of face   . Tracheostomy status ([place])   . Acute sinus infection 10/29/2013  . Rash 10/29/2013  . Hyponatremia 12/03/2012  . Essential hypertension 12/03/2012  . Cervical  spondylolysis 04/30/2011  . Chronic sinusitis 04/27/2011  . Diabetes 04/27/2011  . Erectile dysfunction 04/19/2011  . Preventative health care 03/25/2011  . Cervical spondylosis 03/25/2011  . Pulmonary emphysema ([place]) 03/25/2011  . GERD (gastroesophageal reflux disease) 03/25/2011  . Chronic pain 03/25/2011  . 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 Xerostomia 03/25/2011  . Tracheostomy in place [facility]) 02/04/2011  . Laryngeal cancer ([place]) 02/04/2011  . Encounter for PEG (percutaneous endoscopic gastrostomy) ([place]) 02/04/2011  . Attention to G-tube [facility]) 11/07/2010    Past Surgical History:  Procedure Laterality Date  . GASTROSTOMY TUBE PLACEMENT    . IR GENERIC HISTORICAL  04/03/2016   IR CM INJ ANY COLONIC TUBE W/FLUORO 04/03/2016 [name], [name]  . PEG TUBE PLACEMENT  11/1999  . TRACHEOSTOMY  11/1999       Home Medications    Prior to Admission medications   Medication Sig Start Date End Date Taking? Authorizing Provider  albuterol (PROVENTIL HFA;VENTOLIN HFA) 108 (90 Base) MCG/ACT inhaler Inhale 2 puffs into the lungs every 4 (four) hours as needed for wheezing or shortness of breath. 03/09/16   [name], NP  AMBULATORY NON FORMULARY MEDICATION Trach(Shiley 6.0 XLT UP uncuffed), [place], [place] holders, Disposable inner cannulas 07/08/15   Gildardo Cranker, DO  betamethasone dipropionate 0.05 % lotion Apply topically 2 (two) times daily. To rash on leg 10/20/15   Gildardo Cranker, DO  doxycycline (VIBRA-TABS) 100 MG  tablet Take 1 tablet (100 mg total) by mouth 2 (two) times daily. 03/09/16   [name], NP  ibuprofen (ADVIL,MOTRIN) 400 MG tablet take 1 tablet by mouth three times a day if needed for pain 05/08/16   [name], NP  ibuprofen (ADVIL,MOTRIN) 400 MG tablet take 1 tablet by mouth three times a day if needed for pain 05/10/16   Gildardo Cranker, DO  ipratropium-albuterol (DUONEB) 0.5-2.5 (3) MG/3ML SOLN Take 3 mLs by nebulization every 4  (four) hours as needed. 04/11/16   [place], MD  losartan (COZAAR) 100 MG tablet take 1 tablet by mouth once daily 01/19/16   Gildardo Cranker, DO  naphazoline-pheniramine (NAPHCON-A) 0.025-0.3 % ophthalmic solution Place 2 drops into both eyes 4 (four) times daily as needed for irritation (red eye).    Historical Provider, MD  Nutritional Supplements (FEEDING SUPPLEMENT, JEVITY 1.2 CAL,) LIQD Place 237 mLs into feeding tube See admin instructions. Pt takes 2 cans at breakfast, 2 cans at lunch, 2 cans at dinner and 2 at night 12/02/14   Gildardo Cranker, DO  oxymetazoline (12 HOUR NASAL SPRAY) 0.05 % nasal spray Place 2 sprays into the nose 2 (two) times daily as needed for congestion.     Historical Provider, MD  predniSONE (DELTASONE) 20 MG tablet 2 tablets daily for 6 days 03/09/16   [name], NP  ranitidine (ZANTAC) 150 MG tablet take 1 tablet by mouth twice a day 11/26/15   [name], NP  triamcinolone cream (KENALOG) 0.1 % Apply 1 application topically 2 (two) times daily. 07/02/15   Gildardo Cranker, DO    Family History Family History  Problem Relation Age of Onset  . Diabetes Mother   . Heart disease Father   . Stroke Other   . Diabetes Other   . Cancer Other     lung cancer    Social History Social History  Substance Use Topics  . Smoking status: Former Smoker    Packs/day: 1.00    Years: 23.00    Quit date: 11/06/1996  . Smokeless tobacco: Never Used  . Alcohol use No     Allergies   Oxybutynin chloride and Codeine   Review of Systems Review of Systems All other systems negative except as documented in the HPI. All pertinent positives and negatives as reviewed in the HPI.  Physical Exam Updated Vital Signs BP 138/90 (BP Location: Left Arm)   Pulse 90   Temp 98.5 F (36.9 C) (Axillary)   Resp (!) 26   SpO2 96%   Physical Exam  Constitutional: He is oriented to person, place, and time. He appears well-developed and well-nourished. No  distress.  HENT:  Head: Normocephalic and atraumatic.  Mouth/Throat: Oropharynx is clear and moist.  Eyes: Pupils are equal, round, and reactive to light.  Neck: Normal range of motion. Neck supple.  Cardiovascular: Normal rate, regular rhythm and normal heart sounds.  Exam reveals no gallop and no friction rub.   No murmur heard. Pulmonary/Chest: Effort normal and breath sounds normal. No respiratory distress. He has no wheezes.  Abdominal: He exhibits no distension. There is no tenderness.  Neurological: He is alert and oriented to person, place, and time. He exhibits normal muscle tone. Coordination normal.  Skin: Skin is warm and dry. Capillary refill takes less than 2 seconds. No rash noted. No erythema.  Psychiatric: He has a normal mood and affect. His behavior is normal.  Nursing note and vitals reviewed.  ED Treatments / Results  Labs (all labs ordered are listed, but only abnormal results are displayed) Labs Reviewed - No data to display  EKG  EKG Interpretation None       Radiology No results found.  Procedures Procedures (including critical care time)  Medications Ordered in ED Medications - No data to display   Initial Impression / Assessment and Plan / ED Course  I have reviewed the triage vital signs and the nursing notes.  Pertinent labs & imaging results that were available during my care of the patient were reviewed by me and considered in my medical decision making (see chart for details).     Have the case manager speak with [facility] about repairing the patient suction device.  The respiratory therapist came and suctioned the trach. Patient will be referred back to the trach clinic. [facility] states that he will have to bring them the suction unit Final Clinical Impressions(s) / ED Diagnoses   Final diagnoses:  None    New Prescriptions New Prescriptions   No medications on file     Dalia Heading, PA-C 05/11/16 [place], MD 05/12/16 [phone]

## 2016-05-14 DIAGNOSIS — C153 Malignant neoplasm of upper third of esophagus: Secondary | ICD-10-CM | POA: Diagnosis not present

## 2016-05-15 ENCOUNTER — Other Ambulatory Visit: Payer: Self-pay | Admitting: Licensed Clinical Social Worker

## 2016-05-15 DIAGNOSIS — K9423 Gastrostomy malfunction: Secondary | ICD-10-CM | POA: Diagnosis not present
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
## 2016-05-18 DIAGNOSIS — C153 Malignant neoplasm of upper third of esophagus: Secondary | ICD-10-CM | POA: Diagnosis not present

## 2016-05-19 ENCOUNTER — Other Ambulatory Visit: Payer: Self-pay | Admitting: Licensed Clinical Social Worker

## 2016-05-19 NOTE — Patient Outreach (Signed)
[facility]) Care Management  05/19/2016  Nicholas Cobb 06-25-1950 [id number]  Assessment-CSW completed outreach attempt today to patient's daughter in order to follow up on the [place] intake. CSW unable to reach patient's daughter successfully. CSW left a HIPPA compliant voice message requesting a call back.  Plan-CSW will await return call or complete an additional outreach if needed within one week.  [name], BSW, MSW, [email or web address] Phone: [phone] Fax: [phone]

## 2016-05-21 DIAGNOSIS — J4531 Mild persistent asthma with (acute) exacerbation: Secondary | ICD-10-CM | POA: Diagnosis not present

## 2016-05-22 ENCOUNTER — Other Ambulatory Visit: Payer: Self-pay | Admitting: Licensed Clinical Social Worker

## 2016-05-22 NOTE — Patient Outreach (Signed)
[place] [facility]) Care Management  05/22/2016  Nicholas Cobb [birth year] [id number]   Assessment- CSW completed call to the [place] and was transferred to [facility]. Ezzard Flax reports that she was able to reach patient's daughter on 05/19/16 and that the patient's daughter reported that she would contact [facility] today during her lunch break and complete intake. Ezzard Flax reports that once she has the intake completed then she can complete home assessment to determine if patient is eligible for their program.  Plan-CSW will continue to provide social work assistance and help coordinate services.  [name], BSW, MSW, [email or web address] Phone: [phone] Fax: [phone]

## 2016-05-23 ENCOUNTER — Other Ambulatory Visit: Payer: Self-pay

## 2016-05-23 DIAGNOSIS — J4531 Mild persistent asthma with (acute) exacerbation: Secondary | ICD-10-CM | POA: Diagnosis not present

## 2016-05-23 NOTE — Patient Outreach (Signed)
Nicholas Cobb) Care Management   05/23/2016  Nicholas Cobb 07/15/1950 [id number]  Nicholas Cobb is an 66 y.o. male  Subjective:  Patient communicates by writing notes. Patient wrote I have several appointments I need to make.  Objective:   ROS Well dressed, nonverbal slim framed gentleman.  Physical Exam  ROS ROS  Encounter Medications:   Outpatient Encounter Prescriptions as of 05/23/2016  Medication Sig  . albuterol (PROVENTIL HFA;VENTOLIN HFA) 108 (90 Base) MCG/ACT inhaler Inhale 2 puffs into the lungs every 4 (four) hours as needed for wheezing or shortness of breath.  . AMBULATORY NON FORMULARY MEDICATION Trach(Shiley 6.0 XLT UP uncuffed), [place], Trach holders, Disposable inner cannulas  . betamethasone dipropionate 0.05 % lotion Apply topically 2 (two) times daily. To rash on leg  . doxycycline (VIBRA-TABS) 100 MG tablet Take 1 tablet (100 mg total) by mouth 2 (two) times daily.  Marland Kitchen ibuprofen (ADVIL,MOTRIN) 400 MG tablet take 1 tablet by mouth three times a day if needed for pain  . ibuprofen (ADVIL,MOTRIN) 400 MG tablet take 1 tablet by mouth three times a day if needed for pain  . ipratropium-albuterol (DUONEB) 0.5-2.5 (3) MG/3ML SOLN Take 3 mLs by nebulization every 4 (four) hours as needed.  Marland Kitchen losartan (COZAAR) 100 MG tablet take 1 tablet by mouth once daily  . naphazoline-pheniramine (NAPHCON-A) 0.025-0.3 % ophthalmic solution Place 2 drops into both eyes 4 (four) times daily as needed for irritation (red eye).  . Nutritional Supplements (FEEDING SUPPLEMENT, JEVITY 1.2 CAL,) LIQD Place 237 mLs into feeding tube See admin instructions. Pt takes 2 cans at breakfast, 2 cans at lunch, 2 cans at dinner and 2 at night  . oxymetazoline (12 HOUR NASAL SPRAY) 0.05 % nasal spray Place 2 sprays into the nose 2 (two) times daily as needed for congestion.   . predniSONE (DELTASONE) 20 MG tablet 2 tablets daily for 6 days  . ranitidine (ZANTAC) 150 MG  tablet take 1 tablet by mouth twice a day  . triamcinolone cream (KENALOG) 0.1 % Apply 1 application topically 2 (two) times daily.   No facility-administered encounter medications on file as of 05/23/2016.     Functional Status:   In your present state of health, do you have any difficulty performing the following activities: 04/20/2016 06/14/2015  Hearing? N Y  Vision? Y N  Difficulty concentrating or making decisions? N N  Walking or climbing stairs? Y Y  Dressing or bathing? N N  Doing errands, shopping? Y N  Preparing Food and eating ? Y -  Using the Toilet? N -  In the past six months, have you accidently leaked urine? N -  Do you have problems with loss of bowel control? N -  Managing your Medications? N -  Managing your Finances? N -  Housekeeping or managing your Housekeeping? Y -  Some recent data might be hidden    Fall/Depression Screening:    PHQ 2/9 Scores 04/28/2016 04/20/2016 01/06/2015 10/29/2013 12/03/2012  PHQ - 2 Score 0 0 0 1 1    Assessment:   RNCM assist patient by reordering nebulizer supplies from [facility] Reminded patient about appointment with [name] on Apirl 27 at [place] made appointment for [facility] transportation for appointment on Friday, April 13.  Plan:  Home visit in the next 30 days for discharge.

## 2016-05-29 ENCOUNTER — Other Ambulatory Visit: Payer: Self-pay | Admitting: Licensed Clinical Social Worker

## 2016-05-29 NOTE — Patient Outreach (Addendum)
[place] [facility]) Care Management  05/29/2016  Nicholas Cobb 1950-06-30 [id number]  Assessment- CSW completed outreach call to the [place] and requested to speak to [facility]. CSW left a HIPPA compliant voice message for [facility] requesting a return call once available in order to gain updates on program enrollment.   Plan-CSW will continue to follow case and provide social work assistance and await for return call from Nicholas Cobb.    [name], BSW, MSW, [email or web address] Phone: [phone] Fax: [phone]

## 2016-05-30 ENCOUNTER — Other Ambulatory Visit: Payer: Self-pay | Admitting: Licensed Clinical Social Worker

## 2016-05-30 NOTE — Patient Outreach (Signed)
[place] [facility]) Care Management  05/30/2016  Nicholas Cobb 1950-10-06 [id number]   Assessment-CSW completed outreach attempt today. CSW unable to reach patient successfully. CSW left a HIPPA compliant voice message encouraging patient to return call once available.  Plan-CSW will await return call or complete an additional outreach if needed.  [name], BSW, MSW, [email or web address] Phone: [phone] Fax: [phone]

## 2016-05-31 ENCOUNTER — Ambulatory Visit (HOSPITAL_COMMUNITY)
Admission: RE | Admit: 2016-05-31 | Discharge: 2016-05-31 | Disposition: A | Discharge status: Home / Self Care | Payer: Medicare Other | Source: Ambulatory Visit | Attending: Acute Care | Admitting: Acute Care

## 2016-05-31 DIAGNOSIS — Z930 Tracheostomy status: Secondary | ICD-10-CM | POA: Insufficient documentation

## 2016-05-31 DIAGNOSIS — R062 Wheezing: Secondary | ICD-10-CM

## 2016-05-31 NOTE — Progress Notes (Signed)
   Subjective:  ROV   Patient ID: Nicholas Cobb, male    DOB: 29-Sep-1950, 66 y.o.   MRN: [id number]  HPI 66 year old male well known to me. Followed for trach dependence in setting of severe radio-osteonecrosis resulting in marked mandibular deformity and trach dependence. He is here for routine trach care.   Review of Systems  All other systems reviewed and are negative.     Vital signs:blood pressure 160/95, pulse 90, respirations 20 and pulse oximetry 96% Objective:   Physical Exam  Constitutional: He is oriented to person, place, and time. He appears well-developed and well-nourished.  HENT:  Severely deformed mandibular deformity Trach stoma essentially unremarkable    Cardiovascular: Normal rate and regular rhythm.   Pulmonary/Chest: Effort normal and breath sounds normal. No respiratory distress.  Abdominal: Soft. Bowel sounds are normal. He exhibits no distension.  Musculoskeletal: Normal range of motion. He exhibits no edema or deformity.  Neurological: He is alert and oriented to person, place, and time.   Trach BrandCristy Hilts Size: 6 xlt Style: Proximal and Uncuffed Secured by: Velcro    Assessment & Plan:  Chronic radio-osteonecrosis mandnible  Tracheostomy dependent  Possible cellulitis   Discussion Stable trach. No sig change. He thinks he is more swollen than at baseline. He is convinced that a brief course of antibiotics will help. I am not convinced that he is infected but will give this a trial as this was the regimen he had used in past. He is asking about going to [facility] for another opinion. He has been to [facility] as well as [facility]. It seems as though there is little to offer in the way of surgical intervention & there seems to be significant concern that his healing would be impaired as well and that the risk therefore would out weigh the benefit. I Did tell [name] that if her were to find someone at [facility] I would be happy to forward him what  info I have but essentially my role here is simply trach maintenance.   Plan Trial cipro 500mg  bid & Doxy 100 mg bid X 10 d ROV 3 months Cont routine trach care.

## 2016-05-31 NOTE — Progress Notes (Signed)
Tracheostomy Procedure Note  Nicholas Cobb [id number] 09/26/1950  Pre Procedure Tracheostomy Information  Trach Brand: Shiley Size: 6 xlt Style: Proximal and Uncuffed Secured by: Velcro  VS- BP- 162/100, HR-91, RR-20, SpO2-96%  Procedure: trach change    Post Procedure Tracheostomy Information  Trach Brand: Shiley Size: 6 xlt Style: Proximal and Uncuffed Secured by: Velcro   Post Procedure Evaluation:  ETCO2 positive color change from yellow to purple : Yes.   Vital signs:blood pressure 160/95, pulse 90, respirations 20 and pulse oximetry 96% Patients current condition: stable Complications: No apparent complications Trach site exam: clean, dry Wound care done: 4 x 4 gauze Patient did tolerate procedure well.   Education: None  Prescription needs: Cipro 500 BID Doxy 100 BID   See back in trach clinic in 3 months

## 2016-06-05 DIAGNOSIS — Y842 Radiological procedure and radiotherapy as the cause of abnormal reaction of the patient, or of later complication, without mention of misadventure at the time of the procedure: Secondary | ICD-10-CM

## 2016-06-05 DIAGNOSIS — L598 Other specified disorders of the skin and subcutaneous tissue related to radiation: Secondary | ICD-10-CM | POA: Insufficient documentation

## 2016-06-09 ENCOUNTER — Other Ambulatory Visit: Payer: Self-pay | Admitting: Licensed Clinical Social Worker

## 2016-06-09 DIAGNOSIS — E119 Type 2 diabetes mellitus without complications: Secondary | ICD-10-CM | POA: Diagnosis not present

## 2016-06-09 DIAGNOSIS — R609 Edema, unspecified: Secondary | ICD-10-CM | POA: Diagnosis not present

## 2016-06-09 DIAGNOSIS — J45909 Unspecified asthma, uncomplicated: Secondary | ICD-10-CM | POA: Diagnosis not present

## 2016-06-09 DIAGNOSIS — J9500 Unspecified tracheostomy complication: Secondary | ICD-10-CM | POA: Diagnosis not present

## 2016-06-09 DIAGNOSIS — M879 Osteonecrosis, unspecified: Secondary | ICD-10-CM | POA: Diagnosis not present

## 2016-06-09 NOTE — Patient Outreach (Signed)
[place] [facility]) Care Management  06/09/2016  EVERETTE MALL [birth year] [id number]  Assessment- CSW has not received return call back from Michel Harrow with the [place] completed an additional call to program but was unable to reach [facility]. CSW left another HIPPA compliant voice message encouraging return call.  Plan-CSW will await for return call or complete additional outreach within two weeks.  [name], BSW, MSW, [email or web address] Phone: [phone] Fax: [phone]

## 2016-06-12 ENCOUNTER — Other Ambulatory Visit: Payer: Self-pay | Admitting: Licensed Clinical Social Worker

## 2016-06-12 NOTE — Patient Outreach (Signed)
[place] [facility]) Care Management  06/12/2016  LEEROY LOVINGS April 15, 1950 [id number]  Assessment-CSW completed outreach attempt today to patient's daughter. CSW unable to reach patient's daughter successfully. CSW left a HIPPA compliant voice message encouraging patient to return call once available. CSW also sent a text message to patient's daughter.   Plan-CSW will await return call or complete an additional outreach if needed. CSW is having difficulty maintaining contact with both patient and family.  [name], BSW, MSW, [email or web address] Phone: [phone] Fax: [phone]

## 2016-06-13 ENCOUNTER — Other Ambulatory Visit: Payer: Self-pay

## 2016-06-13 DIAGNOSIS — C153 Malignant neoplasm of upper third of esophagus: Secondary | ICD-10-CM | POA: Diagnosis not present

## 2016-06-14 NOTE — Patient Outreach (Signed)
    This RNCM received call from patient who (neighbor was communicating for patient as he is unable to provide verbal communication due to trach). RNCM was able to establish verification previous home visit a tapping method. Neighbor advises RNCM patient will not be available for appointment on 5/3, needed to reschedule. RNCM provided patient's neighbor with a reschedule date.

## 2016-06-15 ENCOUNTER — Ambulatory Visit: Payer: Medicare Other

## 2016-06-17 DIAGNOSIS — C153 Malignant neoplasm of upper third of esophagus: Secondary | ICD-10-CM | POA: Diagnosis not present

## 2016-06-19 ENCOUNTER — Other Ambulatory Visit: Payer: Self-pay | Admitting: Licensed Clinical Social Worker

## 2016-06-19 NOTE — Patient Outreach (Signed)
[place] [facility]) Care Management  06/19/2016  YOSSI HINCHMAN 27-Aug-1950 [id number]  Assessment-CSW completed outreach attempt today to patient's daughter. CSW unable to reach her successfully. CSW left a HIPPA compliant voice message encouraging a return call if still interested in social work assistance. CSW has been unable to maintain contact with family and patient.   Plan-CSW will send outreach barrier letter at this time and wait 10 business days to hear back before completing discharge.  [name], BSW, MSW, [email or web address] Phone: [phone] Fax: [phone]

## 2016-06-20 ENCOUNTER — Other Ambulatory Visit: Payer: Self-pay

## 2016-06-20 DIAGNOSIS — J4531 Mild persistent asthma with (acute) exacerbation: Secondary | ICD-10-CM | POA: Diagnosis not present

## 2016-06-21 NOTE — Patient Outreach (Signed)
[facility] ([facility]) Care Management   06/20/2016  Nicholas Cobb 08/02/1950 [id number]  Nicholas Cobb is an 66 y.o. male  Subjective:  Patient communicates through written communication. Patient wrote he needed calls made to reorder medication, verification of appointment with primary care physician on June 1st, 2018,   Objective:   ROS Slim, elderly man with trach  Physical Exam ROS  Encounter Medications:   Outpatient Encounter Prescriptions as of 06/20/2016  Medication Sig  . albuterol (PROVENTIL HFA;VENTOLIN HFA) 108 (90 Base) MCG/ACT inhaler Inhale 2 puffs into the lungs every 4 (four) hours as needed for wheezing or shortness of breath.  . AMBULATORY NON FORMULARY MEDICATION Trach(Shiley 6.0 XLT UP uncuffed), Trach Care Kit, Trach holders, Disposable inner cannulas  . betamethasone dipropionate 0.05 % lotion Apply topically 2 (two) times daily. To rash on leg  . doxycycline (VIBRA-TABS) 100 MG tablet Take 1 tablet (100 mg total) by mouth 2 (two) times daily.  . ibuprofen (ADVIL,MOTRIN) 400 MG tablet take 1 tablet by mouth three times a day if needed for pain  . ibuprofen (ADVIL,MOTRIN) 400 MG tablet take 1 tablet by mouth three times a day if needed for pain  . ipratropium-albuterol (DUONEB) 0.5-2.5 (3) MG/3ML SOLN Take 3 mLs by nebulization every 4 (four) hours as needed.  . losartan (COZAAR) 100 MG tablet take 1 tablet by mouth once daily  . naphazoline-pheniramine (NAPHCON-A) 0.025-0.3 % ophthalmic solution Place 2 drops into both eyes 4 (four) times daily as needed for irritation (red eye).  . Nutritional Supplements (FEEDING SUPPLEMENT, JEVITY 1.2 CAL,) LIQD Place 237 mLs into feeding tube See admin instructions. Pt takes 2 cans at breakfast, 2 cans at lunch, 2 cans at dinner and 2 at night  . oxymetazoline (12 HOUR NASAL SPRAY) 0.05 % nasal spray Place 2 sprays into the nose 2 (two) times daily as needed for congestion.   . predniSONE (DELTASONE) 20 MG  tablet 2 tablets daily for 6 days  . ranitidine (ZANTAC) 150 MG tablet take 1 tablet by mouth twice a day  . triamcinolone cream (KENALOG) 0.1 % Apply 1 application topically 2 (two) times daily.   No facility-administered encounter medications on file as of 06/20/2016.     Functional Status:   In your present state of health, do you have any difficulty performing the following activities: 04/20/2016  Hearing? N  Vision? Y  Difficulty concentrating or making decisions? N  Walking or climbing stairs? Y  Dressing or bathing? N  Doing errands, shopping? Y  Preparing Food and eating ? Y  Using the Toilet? N  In the past six months, have you accidently leaked urine? N  Do you have problems with loss of bowel control? N  Managing your Medications? N  Managing your Finances? N  Housekeeping or managing your Housekeeping? Y  Some recent data might be hidden    Fall/Depression Screening:    Fall Risk  04/20/2016 03/09/2016 01/04/2016  Falls in the past year? Yes No No  Number falls in past yr: 2 or more - -  Injury with Fall? No - -  Risk Factor Category  High Fall Risk - -  Risk for fall due to : History of fall(s) - -  Follow up Education provided - -   PHQ 2/9 Scores 04/28/2016 04/20/2016 01/06/2015 10/29/2013 12/03/2012  PHQ - 2 Score 0 0 0 1 1   [facility] CM Care Plan Problem One     Most Recent Value  Care Plan   Problem One  I need a new doctor  Role Documenting the Problem One  Care Management Coordinator  Care Plan for Problem One  Active  [facility] Long Term Goal (31-90 days)  In the next 31 days, patient will have made an appointment with physician of his choice  [facility] Long Term Goal Start Date  04/20/16  [facility] Long Term Goal Met Date  05/23/16  Interventions for Problem One Long Term Goal  home visit to assess .  Patient writeds he has made a decision on the physician he would like to provide care for him.  He needs assistance with making initial appointment.  RNCM and patient agreed to make  appointment on monday  [facility] CM Short Term Goal #1 (0-30 days)  In the next 21 days, patient will have reviewed list of providers meeting his criteria and selected final 2 to contact  [facility] CM Short Term Goal #1 Start Date  04/20/16  [facility] CM Short Term Goal #1 Met Date  06/20/16  Interventions for Short Term Goal #1  Patient and RNCM plan to make appointment with new primary care next week.      [facility] CM Care Plan Problem Two     Most Recent Value  Care Plan Problem Two  patient can only communicate using written language due to throat cancer  Role Documenting the Problem Two  Care Management Coordinator  Care Plan for Problem Two  Active  Interventions for Problem Two Long Term Goal   [facility] LCSW has consulted with patient and his daughter regarding community resources.  LCSW has made all qualifying community resources   [facility] Long Term Goal (31-90) days  in the next 31 days, patient will have a [facility] LCSW consult to assess community resources for communications devices  [facility] Long Term Goal Start Date  04/20/16  [facility] Long Term Goal Met Date  05/23/16    [facility] CM Care Plan Problem Three     Most Recent Value  Care Plan Problem Three  patient needs assistance iwth light housekeeping, however, his income levelis above medicaid eligbility for [employer]  Role Documenting the Problem Three  Care Management Coordinator  Care Plan for Problem Three  Active  [facility] Long Term Goal (31-90) days  In the next 31 days, patient will meet wtih [facility] LCSW to assess community resources for assistance with light housekeeping  [facility] Long Term Goal Start Date  04/21/16  [facility] Long Term Goal Met Date  06/20/16  Interventions for Problem Three Long Term Goal  [facility] LCSW referral made to assist with researching community resources.     Assessment:   Patient has met all his case management goals. Patient made aware, again, that this is the discharge date, if further assistance is needed. Patient given contact information for [facility]  office if further assistance is needed.  Plan:  Discharge from caseload, patient has met her case management goals.    

## 2016-06-29 DIAGNOSIS — C154 Malignant neoplasm of middle third of esophagus: Secondary | ICD-10-CM | POA: Diagnosis not present

## 2016-06-29 DIAGNOSIS — Z930 Tracheostomy status: Secondary | ICD-10-CM | POA: Diagnosis not present

## 2016-06-29 DIAGNOSIS — C153 Malignant neoplasm of upper third of esophagus: Secondary | ICD-10-CM | POA: Diagnosis not present

## 2016-06-29 DIAGNOSIS — R609 Edema, unspecified: Secondary | ICD-10-CM | POA: Diagnosis not present

## 2016-06-29 DIAGNOSIS — H9319 Tinnitus, unspecified ear: Secondary | ICD-10-CM | POA: Diagnosis not present

## 2016-06-29 DIAGNOSIS — M8660 Other chronic osteomyelitis, unspecified site: Secondary | ICD-10-CM | POA: Diagnosis not present

## 2016-06-30 ENCOUNTER — Other Ambulatory Visit: Payer: Self-pay | Admitting: Licensed Clinical Social Worker

## 2016-06-30 NOTE — Patient Outreach (Signed)
[facility]) Care Management  06/30/2016  Nicholas Cobb 29-Feb-1950 [id number]  Assessment- CSW has sent outreach barrier letter to patient and has waited 10 business days to hear back from patient or family. CSW will complete case closure at this time since CSW has been unable to maintain contact with patient and family.   Plan-CSW will complete case closure and will notify PCP and [place] Management Assistant.  [name], BSW, MSW, [email or web address] Phone: [phone] Fax: [phone]

## 2016-07-14 DIAGNOSIS — J449 Chronic obstructive pulmonary disease, unspecified: Secondary | ICD-10-CM | POA: Diagnosis not present

## 2016-07-14 DIAGNOSIS — C153 Malignant neoplasm of upper third of esophagus: Secondary | ICD-10-CM | POA: Diagnosis not present

## 2016-07-14 DIAGNOSIS — M8708 Idiopathic aseptic necrosis of bone, other site: Secondary | ICD-10-CM | POA: Diagnosis not present

## 2016-07-14 DIAGNOSIS — L309 Dermatitis, unspecified: Secondary | ICD-10-CM | POA: Diagnosis not present

## 2016-07-14 DIAGNOSIS — Z930 Tracheostomy status: Secondary | ICD-10-CM | POA: Diagnosis not present

## 2016-07-20 ENCOUNTER — Other Ambulatory Visit: Payer: Self-pay | Admitting: Internal Medicine

## 2016-07-21 DIAGNOSIS — J4531 Mild persistent asthma with (acute) exacerbation: Secondary | ICD-10-CM | POA: Diagnosis not present

## 2016-07-26 DIAGNOSIS — C153 Malignant neoplasm of upper third of esophagus: Secondary | ICD-10-CM | POA: Diagnosis not present

## 2016-07-29 ENCOUNTER — Other Ambulatory Visit: Payer: Self-pay | Admitting: Nurse Practitioner

## 2016-08-01 ENCOUNTER — Encounter (HOSPITAL_COMMUNITY): Payer: Self-pay | Admitting: Emergency Medicine

## 2016-08-01 ENCOUNTER — Telehealth (HOSPITAL_COMMUNITY): Payer: Self-pay | Admitting: Emergency Medicine

## 2016-08-01 ENCOUNTER — Ambulatory Visit (HOSPITAL_COMMUNITY)
Admission: EM | Admit: 2016-08-01 | Discharge: 2016-08-01 | Disposition: A | Discharge status: Home / Self Care | Payer: Medicare Other | Attending: Family Medicine | Admitting: Family Medicine

## 2016-08-01 DIAGNOSIS — Z430 Encounter for attention to tracheostomy: Secondary | ICD-10-CM | POA: Diagnosis not present

## 2016-08-01 DIAGNOSIS — R0600 Dyspnea, unspecified: Secondary | ICD-10-CM | POA: Diagnosis not present

## 2016-08-01 DIAGNOSIS — Z760 Encounter for issue of repeat prescription: Secondary | ICD-10-CM

## 2016-08-01 MED ORDER — IPRATROPIUM-ALBUTEROL 0.5-2.5 (3) MG/3ML IN SOLN
3.0000 mL | Freq: Once | RESPIRATORY_TRACT | Status: AC
  Administered 2016-08-01: 3 mL via RESPIRATORY_TRACT

## 2016-08-01 MED ORDER — IPRATROPIUM-ALBUTEROL 0.5-2.5 (3) MG/3ML IN SOLN
3.0000 mL | RESPIRATORY_TRACT | 1 refills | Status: DC | PRN
Start: 2016-08-01 — End: 2016-08-01

## 2016-08-01 MED ORDER — IPRATROPIUM-ALBUTEROL 0.5-2.5 (3) MG/3ML IN SOLN
RESPIRATORY_TRACT | Status: AC
  Filled 2016-08-01: qty 3

## 2016-08-01 MED ORDER — IPRATROPIUM-ALBUTEROL 0.5-2.5 (3) MG/3ML IN SOLN: 3.0000 mL | RESPIRATORY_TRACT | 1 refills | Status: DC | PRN

## 2016-08-01 MED ORDER — IPRATROPIUM-ALBUTEROL 0.5-2.5 (3) MG/3ML IN SOLN: 3.0000 mL | Freq: Four times a day (QID) | RESPIRATORY_TRACT | 1 refills | Status: DC | PRN

## 2016-08-01 NOTE — Discharge Instructions (Signed)
I have electronically sent your DuoNeb prescription to [facility] in [place], [place].  I have also written a seven day supply of Duoneb to your [employer].

## 2016-08-01 NOTE — ED Provider Notes (Signed)
[facility]    CSN: [id number] Arrival date & time: 08/01/16  1115     History   Chief Complaint Chief Complaint  Patient presents with  . Medication Refill    HPI Nicholas Cobb is a 66 y.o. male.   This is a 66 year old man who comes to the [facility] urgent care center requesting a refill on his DuoNeb.  He asked his primary care doctor Kevan Ny M.D.) 2 male in his prescription on June 1. This went to the wrong address and his run out for the last 2 days. His breathing is getting worse.  Patient is a chronic tracheostomy patient with COPD.      Past Medical History:  Diagnosis Date  . Allergy   . Cancer ([place]) 1999   throat cancer  . Cervical spondylolysis 04/30/2011   severe  . Cervical spondylosis 03/25/2011  . Change in voice   . Chronic pain 03/25/2011  . Chronic sinusitis 04/27/2011  . Depression   . Difficulty urinating   . Emphysema 03/25/2011  . Essential hypertension, benign 12/03/2012  . GERD (gastroesophageal reflux disease) 03/25/2011  . Hearing loss   . Impaired glucose tolerance 04/27/2011  . 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 Rash   . Trouble swallowing   . Type II or unspecified type diabetes mellitus without mention of complication, uncontrolled 04/27/2011  . Ulcer   . UTI (lower urinary tract infection)   . Xerostomia 03/25/2011    Patient Active Problem List   Diagnosis Date Noted  . Radionecrosis   . Tracheostomy care ([place])   . Hypoxemia   . Hemoptysis 01/26/2016  . Throat cancer ([place])   . Controlled diabetes mellitus type 2 with complications ([place])   . Chronic respiratory failure ([place]) 06/14/2015  . Respiratory failure ([place]) 06/13/2015  . Tracheostomy dependence ([place])   . Cellulitis and abscess of head   . Cellulitis of face   . Tracheostomy status ([place])   . Acute sinus infection 10/29/2013  . Rash 10/29/2013  . Hyponatremia 12/03/2012  . Essential hypertension 12/03/2012  . Cervical spondylolysis  04/30/2011  . Chronic sinusitis 04/27/2011  . Diabetes 04/27/2011  . Erectile dysfunction 04/19/2011  . Preventative health care 03/25/2011  . Cervical spondylosis 03/25/2011  . Pulmonary emphysema ([place]) 03/25/2011  . GERD (gastroesophageal reflux disease) 03/25/2011  . Chronic pain 03/25/2011  . 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 Xerostomia 03/25/2011  . Tracheostomy in place [facility]) 02/04/2011  . Laryngeal cancer ([place]) 02/04/2011  . Encounter for PEG (percutaneous endoscopic gastrostomy) ([place]) 02/04/2011  . Attention to G-tube [facility]) 11/07/2010    Past Surgical History:  Procedure Laterality Date  . GASTROSTOMY TUBE PLACEMENT    . IR GENERIC HISTORICAL  04/03/2016   IR CM INJ ANY COLONIC TUBE W/FLUORO 04/03/2016 [name], [name]  . PEG TUBE PLACEMENT  11/1999  . TRACHEOSTOMY  11/1999       Home Medications    Prior to Admission medications   Medication Sig Start Date End Date Taking? Authorizing Provider  albuterol (PROVENTIL HFA;VENTOLIN HFA) 108 (90 Base) MCG/ACT inhaler Inhale 2 puffs into the lungs every 4 (four) hours as needed for wheezing or shortness of breath. 03/09/16   [name], NP  AMBULATORY NON FORMULARY MEDICATION Trach(Shiley 6.0 XLT UP uncuffed), Trach Care Kit, Trach holders, Disposable inner cannulas 07/08/15   Gildardo Cranker, DO  betamethasone dipropionate 0.05 % lotion Apply topically 2 (two) times daily. To rash on leg  10/20/15   Gildardo Cranker, DO  ibuprofen (ADVIL,MOTRIN) 400 MG tablet take 1 tablet by mouth three times a day if needed for pain 05/08/16   [name], NP  ibuprofen (ADVIL,MOTRIN) 400 MG tablet take 1 tablet by mouth three times a day if needed for pain 05/10/16   Gildardo Cranker, DO  ipratropium-albuterol (DUONEB) 0.5-2.5 (3) MG/3ML SOLN Take 3 mLs by nebulization every 4 (four) hours as needed. 08/01/16   Robyn Haber, MD  losartan (COZAAR) 100 MG tablet take 1 tablet by mouth once daily 07/20/16   [name], NP  naphazoline-pheniramine (NAPHCON-A) 0.025-0.3 % ophthalmic solution Place 2 drops into both eyes 4 (four) times daily as needed for irritation (red eye).    [provider]  Nutritional Supplements (FEEDING SUPPLEMENT, JEVITY 1.2 CAL,) LIQD Place 237 mLs into feeding tube See admin instructions. Pt takes 2 cans at breakfast, 2 cans at lunch, 2 cans at dinner and 2 at night 12/02/14   Gildardo Cranker, DO  oxymetazoline (12 HOUR NASAL SPRAY) 0.05 % nasal spray Place 2 sprays into the nose 2 (two) times daily as needed for congestion.     [provider]  ranitidine (ZANTAC) 150 MG tablet take 1 tablet by mouth twice a day 07/31/16   [name], NP  triamcinolone cream (KENALOG) 0.1 % Apply 1 application topically 2 (two) times daily. 07/02/15   Gildardo Cranker, DO    Family History Family History  Problem Relation Age of Onset  . Diabetes Mother   . Heart disease Father   . Stroke Other   . Diabetes Other   . Cancer Other        lung cancer    Social History Social History  Substance Use Topics  . Smoking status: Former Smoker    Packs/day: 1.00    Years: 23.00    Quit date: 11/06/1996  . Smokeless tobacco: Never Used  . Alcohol use No     Allergies   Oxybutynin chloride and Codeine   Review of Systems Review of Systems  Constitutional: Negative for fever.  Respiratory: Positive for shortness of breath.      Physical Exam Triage Vital Signs ED Triage Vitals  Enc Vitals Group     BP 08/01/16 1140 (!) 152/90     Pulse Rate 08/01/16 1140 74     Resp 08/01/16 1140 (!) 22     Temp 08/01/16 1140 98.4 F (36.9 C)     Temp Source 08/01/16 1140 Oral     SpO2 08/01/16 1140 98 %     Weight --      Height --      Head Circumference --      Peak Flow --      Pain Score 08/01/16 1138 0     Pain Loc --      Pain Edu? --      Excl. in [place]? --    No data found.   Updated Vital Signs BP (!) 152/90 (BP Location: Right Arm)   Pulse 74    Temp 98.4 F (36.9 C) (Oral)   Resp (!) 22   SpO2 98%      Physical Exam  Constitutional: He is oriented to person, place, and time. He appears well-developed and well-nourished.  HENT:  Right Ear: External ear normal.  Left Ear: External ear normal.  Eyes: Conjunctivae are normal.  Neck: Normal range of motion. Neck supple.  Pulmonary/Chest:  Labored breathing with a few  expiratory wheezes  Neurological: He is alert and oriented to person, place, and time.  Skin: Skin is warm and dry.  Nursing note and vitals reviewed.    UC Treatments / Results  Labs (all labs ordered are listed, but only abnormal results are displayed) Labs Reviewed - No data to display  EKG  EKG Interpretation None       Radiology No results found.  Procedures Procedures (including critical care time)  Medications Ordered in UC Medications  ipratropium-albuterol (DUONEB) 0.5-2.5 (3) MG/3ML nebulizer solution 3 mL (not administered)     Initial Impression / Assessment and Plan / UC Course  I have reviewed the triage vital signs and the nursing notes.  Pertinent labs & imaging results that were available during my care of the patient were reviewed by me and considered in my medical decision making (see chart for details).     Final Clinical Impressions(s) / UC Diagnoses   Final diagnoses:  Tracheostomy care [facility])  Dyspnea, unspecified type  Medication refill    New Prescriptions Current Discharge Medication List       Robyn Haber, MD 08/01/16 1211

## 2016-08-01 NOTE — ED Triage Notes (Signed)
The patient requested a refill of his duoneb for his nebulizer.

## 2016-08-01 NOTE — Telephone Encounter (Signed)
Pharmacy called and stated that the patient's DuoNeb has to be changed to Q6h PRN...Dr. Joseph Art advised to change same.

## 2016-08-15 DIAGNOSIS — J4531 Mild persistent asthma with (acute) exacerbation: Secondary | ICD-10-CM | POA: Diagnosis not present

## 2016-08-20 DIAGNOSIS — J4531 Mild persistent asthma with (acute) exacerbation: Secondary | ICD-10-CM | POA: Diagnosis not present

## 2016-08-22 DIAGNOSIS — K9423 Gastrostomy malfunction: Secondary | ICD-10-CM | POA: Diagnosis not present

## 2016-08-26 DIAGNOSIS — C153 Malignant neoplasm of upper third of esophagus: Secondary | ICD-10-CM | POA: Diagnosis not present

## 2016-08-30 ENCOUNTER — Inpatient Hospital Stay (HOSPITAL_COMMUNITY): Admission: RE | Admit: 2016-08-30 | Payer: Medicare Other | Source: Ambulatory Visit

## 2016-09-04 DIAGNOSIS — J4531 Mild persistent asthma with (acute) exacerbation: Secondary | ICD-10-CM | POA: Diagnosis not present

## 2016-09-05 DIAGNOSIS — C153 Malignant neoplasm of upper third of esophagus: Secondary | ICD-10-CM | POA: Diagnosis not present

## 2016-09-05 DIAGNOSIS — C154 Malignant neoplasm of middle third of esophagus: Secondary | ICD-10-CM | POA: Diagnosis not present

## 2016-09-05 DIAGNOSIS — R609 Edema, unspecified: Secondary | ICD-10-CM | POA: Diagnosis not present

## 2016-09-05 DIAGNOSIS — H9319 Tinnitus, unspecified ear: Secondary | ICD-10-CM | POA: Diagnosis not present

## 2016-09-05 DIAGNOSIS — Z930 Tracheostomy status: Secondary | ICD-10-CM | POA: Diagnosis not present

## 2016-09-05 DIAGNOSIS — M8660 Other chronic osteomyelitis, unspecified site: Secondary | ICD-10-CM | POA: Diagnosis not present

## 2016-09-20 DIAGNOSIS — J4531 Mild persistent asthma with (acute) exacerbation: Secondary | ICD-10-CM | POA: Diagnosis not present

## 2016-09-26 DIAGNOSIS — C153 Malignant neoplasm of upper third of esophagus: Secondary | ICD-10-CM | POA: Diagnosis not present

## 2016-10-05 DIAGNOSIS — J4531 Mild persistent asthma with (acute) exacerbation: Secondary | ICD-10-CM | POA: Diagnosis not present

## 2016-10-09 DIAGNOSIS — C154 Malignant neoplasm of middle third of esophagus: Secondary | ICD-10-CM | POA: Diagnosis not present

## 2016-10-09 DIAGNOSIS — Z930 Tracheostomy status: Secondary | ICD-10-CM | POA: Diagnosis not present

## 2016-10-09 DIAGNOSIS — C153 Malignant neoplasm of upper third of esophagus: Secondary | ICD-10-CM | POA: Diagnosis not present

## 2016-10-09 DIAGNOSIS — M8660 Other chronic osteomyelitis, unspecified site: Secondary | ICD-10-CM | POA: Diagnosis not present

## 2016-10-09 DIAGNOSIS — H9319 Tinnitus, unspecified ear: Secondary | ICD-10-CM | POA: Diagnosis not present

## 2016-10-09 DIAGNOSIS — R609 Edema, unspecified: Secondary | ICD-10-CM | POA: Diagnosis not present

## 2016-10-12 ENCOUNTER — Other Ambulatory Visit: Payer: Self-pay | Admitting: Nurse Practitioner

## 2016-10-21 DIAGNOSIS — J4531 Mild persistent asthma with (acute) exacerbation: Secondary | ICD-10-CM | POA: Diagnosis not present

## 2016-10-26 DIAGNOSIS — J4531 Mild persistent asthma with (acute) exacerbation: Secondary | ICD-10-CM | POA: Diagnosis not present

## 2016-10-27 DIAGNOSIS — C153 Malignant neoplasm of upper third of esophagus: Secondary | ICD-10-CM | POA: Diagnosis not present

## 2016-11-06 DIAGNOSIS — I10 Essential (primary) hypertension: Secondary | ICD-10-CM | POA: Diagnosis not present

## 2016-11-06 DIAGNOSIS — K0820 Unspecified atrophy of edentulous alveolar ridge: Secondary | ICD-10-CM | POA: Diagnosis not present

## 2016-11-06 DIAGNOSIS — M8708 Idiopathic aseptic necrosis of bone, other site: Secondary | ICD-10-CM | POA: Diagnosis not present

## 2016-11-07 DIAGNOSIS — H9319 Tinnitus, unspecified ear: Secondary | ICD-10-CM | POA: Diagnosis not present

## 2016-11-07 DIAGNOSIS — C154 Malignant neoplasm of middle third of esophagus: Secondary | ICD-10-CM | POA: Diagnosis not present

## 2016-11-07 DIAGNOSIS — M8660 Other chronic osteomyelitis, unspecified site: Secondary | ICD-10-CM | POA: Diagnosis not present

## 2016-11-07 DIAGNOSIS — R609 Edema, unspecified: Secondary | ICD-10-CM | POA: Diagnosis not present

## 2016-11-07 DIAGNOSIS — C153 Malignant neoplasm of upper third of esophagus: Secondary | ICD-10-CM | POA: Diagnosis not present

## 2016-11-07 DIAGNOSIS — Z930 Tracheostomy status: Secondary | ICD-10-CM | POA: Diagnosis not present

## 2016-11-08 ENCOUNTER — Ambulatory Visit (HOSPITAL_COMMUNITY)
Admission: RE | Admit: 2016-11-08 | Discharge: 2016-11-08 | Disposition: A | Discharge status: Home / Self Care | Payer: Medicare Other | Source: Ambulatory Visit | Attending: Acute Care | Admitting: Acute Care

## 2016-11-08 DIAGNOSIS — L598 Other specified disorders of the skin and subcutaneous tissue related to radiation: Secondary | ICD-10-CM

## 2016-11-08 DIAGNOSIS — Z930 Tracheostomy status: Secondary | ICD-10-CM

## 2016-11-08 DIAGNOSIS — Y842 Radiological procedure and radiotherapy as the cause of abnormal reaction of the patient, or of later complication, without mention of misadventure at the time of the procedure: Secondary | ICD-10-CM | POA: Diagnosis not present

## 2016-11-08 DIAGNOSIS — L03211 Cellulitis of face: Secondary | ICD-10-CM | POA: Diagnosis not present

## 2016-11-08 DIAGNOSIS — M8788 Other osteonecrosis, other site: Secondary | ICD-10-CM | POA: Diagnosis not present

## 2016-11-08 MED ORDER — DOXYCYCLINE HYCLATE 100 MG PO TABS: 100.0000 mg | ORAL_TABLET | Freq: Two times a day (BID) | ORAL | 0 refills | Status: DC

## 2016-11-08 MED ORDER — DOXYCYCLINE HYCLATE 100 MG PO TABS: 100.0000 mg | ORAL_TABLET | Freq: Two times a day (BID) | ORAL | Status: DC

## 2016-11-08 MED ORDER — CIPROFLOXACIN HCL 500 MG PO TABS: 500.0000 mg | ORAL_TABLET | Freq: Two times a day (BID) | ORAL | 0 refills | Status: DC

## 2016-11-08 MED ORDER — CIPROFLOXACIN HCL 500 MG PO TABS: 500.0000 mg | ORAL_TABLET | Freq: Two times a day (BID) | ORAL | Status: DC

## 2016-11-08 NOTE — Progress Notes (Signed)
Subjective:  Return visit for routine trach care  Patient ID: HOPE HOLST, male    DOB: 30-Jun-1950, 66 y.o.   MRN: [id number]  HPI This is a 66 year old male well-known to me. He is tracheostomy dependent in the setting of very severe radio-osteonecrosis resulting in marked mandibular deformity. He has had a history of recurrent cellulitis and subacute osteomyelitis of his mandible. I have not seen him since April 2018. This is in spite of several attempts to reach him through our clinic. He presents to the clinic today for routine tracheostomy change.  He notes he is in his usual state of health. He is now followed by [name] is his primary care provider. He does note worsening facial swelling and discomfort. This is consistent with prior events. He's also had thick tracheal secretions, and blood-tinged secretions. He denies fever.   Review of Systems Gen.: No fever chills megalies or activity change. HEENT: Increased facial swelling, and blood-tinged oral endotracheal secretions, eye is swollen. Pulmonary: No dyspnea, cough, or chest pain. Cardiac: No chest pain dizziness or orthopnea. Musculoskeletal: No pain, or weakness. Abdomen: No nausea vomiting or diarrhea no: No dizziness, falls, gait disturbances.   Heart rate 75 respirations 15 blood pressure 149/72 saturations 97-95% on room air Objective:   Physical Exam Gen.: 66 year old male sitting up in chair in no acute distress. HEENT: He has massive swelling involving the right mandible, this travels all the way up to his right face and I. His eyes swollen shut today. The extent of the mandibular swelling extends down below the chin, making difficult view of tracheostomy. He has a #6 proximal XLT tracheostomy. This is largely unremarkable. Of note during placement of trach tube the distal end of the tracheostomy abuts the posterior wall of the trachea, with the internal cannula in place the airway is approximately 25% obstructed,  this was observed using bedside bronchoscopy. Pulmonary: Clear to auscultation without accessory muscle use cardiac: Regular rate and rhythm no murmur rub or gallop. Abdomen PEG is in place. Positive bowel sounds. Musculoskeletal equal strength no focal deficits. Neuro: No focal deficits.  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 This is superimposed on his chronic radio- osteonecrosis. From a tracheostomy. Standpoint things are essentially the same. He definitely does better with tracheostomy change using bronchoscopy guidance to avoid trauma. Given with a tracheostomy since in the trachea I wonder if a distal XLT instead of the proximal XLT might be a better option.  Plan He will return in 3 months for routine tracheostomy change, during this change we will consider distal XLT Cipro 500 mg by mouth twice a day and doxycycline 100 mg by mouth twice a day both for 10 days He will contact me next week to update me on facial swelling and symptoms I again have recommended that he recheck to his ENT at [facility] or [facility]. He has seen ENT services at both tertiary centers. I have told him I'm happy to assist him with routine tracheostomy changes however if he wants to discuss additional options this must be done at a tertiary center.  60 minutes of time was dedicated to this visit  [name] ACNP-BC [place] Pager # [phone] OR # [phone] if no  answer

## 2016-11-08 NOTE — Progress Notes (Signed)
Tracheostomy Procedure Note  HASHIR DELEEUW [id number] 1951-01-08  Pre Procedure Tracheostomy Information  Trach Brand: Shiley Size: 6.0 XLT Style: Proximal and Uncuffed Secured by: Velcro   Procedure: trach change with bronchoscope    Post Procedure Tracheostomy Information  Trach Brand: Shiley Size: 6.0 XLT Style: Proximal and Uncuffed Secured by: Velcro   Post Procedure Evaluation:  ETCO2 positive color change from yellow to purple : Yes.   Vital signs:blood pressure 149/72, pulse 75, respirations 15 and pulse oximetry 95 % Patients current condition: stable Complications: No apparent complications Trach site exam: clean, dry Wound care done: dry and 4 x 4 gauze Patient did tolerate procedure well.   Education: none  Prescription needs: Antibiotic for facial swelling    Additional needs: May want to try a distal trach instead of proximal

## 2016-11-13 ENCOUNTER — Telehealth: Payer: Self-pay | Admitting: Pulmonary Disease

## 2016-11-13 NOTE — Telephone Encounter (Signed)
lmtcb X1 for Nicholas Cobb at [employer].   Pt was seen by trach clinic- Nicholas Cobb may need to call trach clinic for updates/recs.  Marni Griffon gave pt abx on 9/26. [place] clinic #: [phone]

## 2016-11-14 NOTE — Telephone Encounter (Signed)
Called and spoke with Nicholas Cobb and he stated that the pt gave him the number to call---but he stated that this information needed to go to the trach clinic.  I did give him the number to call for the trach clinic.  Nothing further is needed.

## 2016-11-20 DIAGNOSIS — J4531 Mild persistent asthma with (acute) exacerbation: Secondary | ICD-10-CM | POA: Diagnosis not present

## 2016-11-21 ENCOUNTER — Other Ambulatory Visit: Payer: Self-pay | Admitting: Nurse Practitioner

## 2016-11-24 ENCOUNTER — Emergency Department (HOSPITAL_COMMUNITY): Payer: Medicare Other

## 2016-11-24 ENCOUNTER — Inpatient Hospital Stay (HOSPITAL_COMMUNITY)
Admission: EM | Admit: 2016-11-24 | Discharge: 2016-11-27 | DRG: 206 | Disposition: A | Discharge status: Home / Self Care | Payer: Medicare Other | Attending: Internal Medicine | Admitting: Internal Medicine

## 2016-11-24 ENCOUNTER — Emergency Department (HOSPITAL_COMMUNITY): Payer: Medicare Other | Admitting: Anesthesiology

## 2016-11-24 ENCOUNTER — Encounter (HOSPITAL_COMMUNITY)
Admission: EM | Disposition: A | Discharge status: Home / Self Care | Payer: Self-pay | Source: Home / Self Care | Attending: Internal Medicine

## 2016-11-24 ENCOUNTER — Encounter (HOSPITAL_COMMUNITY): Payer: Self-pay | Admitting: Anesthesiology

## 2016-11-24 DIAGNOSIS — R0603 Acute respiratory distress: Secondary | ICD-10-CM | POA: Diagnosis not present

## 2016-11-24 DIAGNOSIS — I10 Essential (primary) hypertension: Secondary | ICD-10-CM | POA: Diagnosis present

## 2016-11-24 DIAGNOSIS — Z8589 Personal history of malignant neoplasm of other organs and systems: Secondary | ICD-10-CM | POA: Diagnosis not present

## 2016-11-24 DIAGNOSIS — C329 Malignant neoplasm of larynx, unspecified: Secondary | ICD-10-CM | POA: Diagnosis not present

## 2016-11-24 DIAGNOSIS — Z8249 Family history of ischemic heart disease and other diseases of the circulatory system: Secondary | ICD-10-CM

## 2016-11-24 DIAGNOSIS — I890 Lymphedema, not elsewhere classified: Secondary | ICD-10-CM | POA: Diagnosis not present

## 2016-11-24 DIAGNOSIS — K219 Gastro-esophageal reflux disease without esophagitis: Secondary | ICD-10-CM | POA: Diagnosis not present

## 2016-11-24 DIAGNOSIS — Z79899 Other long term (current) drug therapy: Secondary | ICD-10-CM

## 2016-11-24 DIAGNOSIS — Z931 Gastrostomy status: Secondary | ICD-10-CM | POA: Diagnosis not present

## 2016-11-24 DIAGNOSIS — Z833 Family history of diabetes mellitus: Secondary | ICD-10-CM

## 2016-11-24 DIAGNOSIS — M272 Inflammatory conditions of jaws: Secondary | ICD-10-CM | POA: Diagnosis present

## 2016-11-24 DIAGNOSIS — Z452 Encounter for adjustment and management of vascular access device: Secondary | ICD-10-CM | POA: Diagnosis not present

## 2016-11-24 DIAGNOSIS — J9611 Chronic respiratory failure with hypoxia: Secondary | ICD-10-CM | POA: Diagnosis not present

## 2016-11-24 DIAGNOSIS — Z87891 Personal history of nicotine dependence: Secondary | ICD-10-CM | POA: Diagnosis not present

## 2016-11-24 DIAGNOSIS — J9501 Hemorrhage from tracheostomy stoma: Secondary | ICD-10-CM | POA: Diagnosis not present

## 2016-11-24 DIAGNOSIS — R0600 Dyspnea, unspecified: Secondary | ICD-10-CM | POA: Diagnosis present

## 2016-11-24 DIAGNOSIS — J449 Chronic obstructive pulmonary disease, unspecified: Secondary | ICD-10-CM | POA: Diagnosis not present

## 2016-11-24 DIAGNOSIS — Y92009 Unspecified place in unspecified non-institutional (private) residence as the place of occurrence of the external cause: Secondary | ICD-10-CM

## 2016-11-24 DIAGNOSIS — J9509 Other tracheostomy complication: Secondary | ICD-10-CM | POA: Diagnosis not present

## 2016-11-24 DIAGNOSIS — E119 Type 2 diabetes mellitus without complications: Secondary | ICD-10-CM | POA: Diagnosis not present

## 2016-11-24 DIAGNOSIS — J9503 Malfunction of tracheostomy stoma: Principal | ICD-10-CM | POA: Diagnosis present

## 2016-11-24 DIAGNOSIS — Y838 Other surgical procedures as the cause of abnormal reaction of the patient, or of later complication, without mention of misadventure at the time of the procedure: Secondary | ICD-10-CM | POA: Diagnosis present

## 2016-11-24 DIAGNOSIS — J439 Emphysema, unspecified: Secondary | ICD-10-CM | POA: Diagnosis not present

## 2016-11-24 DIAGNOSIS — Z8521 Personal history of malignant neoplasm of larynx: Secondary | ICD-10-CM | POA: Diagnosis not present

## 2016-11-24 DIAGNOSIS — Z923 Personal history of irradiation: Secondary | ICD-10-CM

## 2016-11-24 DIAGNOSIS — Z430 Encounter for attention to tracheostomy: Secondary | ICD-10-CM | POA: Diagnosis not present

## 2016-11-24 DIAGNOSIS — J9500 Unspecified tracheostomy complication: Secondary | ICD-10-CM | POA: Diagnosis not present

## 2016-11-24 DIAGNOSIS — J90 Pleural effusion, not elsewhere classified: Secondary | ICD-10-CM | POA: Diagnosis not present

## 2016-11-24 HISTORY — PX: TRACHEOSTOMY TUBE PLACEMENT: SHX814

## 2016-11-24 LAB — BASIC METABOLIC PANEL
Anion gap: 10 (ref 5–15)
BUN: 13 mg/dL (ref 6–20)
CALCIUM: 9.5 mg/dL (ref 8.9–10.3)
CO2: 26 mmol/L (ref 22–32)
CREATININE: 0.95 mg/dL (ref 0.61–1.24)
Chloride: 93 mmol/L — ABNORMAL LOW (ref 101–111)
GFR calc non Af Amer: >60 mL/min (ref >60)
Glucose, Bld: 145 mg/dL — ABNORMAL HIGH (ref 65–99)
Potassium: 4.7 mmol/L (ref 3.5–5.1)
SODIUM: 129 mmol/L — AB (ref 135–145)

## 2016-11-24 LAB — I-STAT ARTERIAL BLOOD GAS, ED
Acid-Base Excess: 1 mmol/L (ref 0.0–2.0)
BICARBONATE: 25.3 mmol/L (ref 20.0–28.0)
O2 SAT: 100 %
PCO2 ART: 38.9 mmHg (ref 32.0–48.0)
PO2 ART: 490 mmHg — AB (ref 83.0–108.0)
Patient temperature: 98.6
TCO2: 26 mmol/L (ref 22–32)
pH, Arterial: 7.422 (ref 7.350–7.450)

## 2016-11-24 LAB — CBC
HCT: 46.3 % (ref 39.0–52.0)
HCT: 38.3 % — ABNORMAL LOW (ref 39.0–52.0)
HEMATOCRIT: 41.6 % (ref 39.0–52.0)
HEMOGLOBIN: 14.2 g/dL (ref 13.0–17.0)
Hemoglobin: 15.3 g/dL (ref 13.0–17.0)
Hemoglobin: 12.5 g/dL — ABNORMAL LOW (ref 13.0–17.0)
MCH: 29.7 pg (ref 26.0–34.0)
MCH: 29.3 pg (ref 26.0–34.0)
MCH: 30.3 pg (ref 26.0–34.0)
MCHC: 33 g/dL (ref 30.0–36.0)
MCHC: 32.6 g/dL (ref 30.0–36.0)
MCHC: 34.1 g/dL (ref 30.0–36.0)
MCV: 89.7 fL (ref 78.0–100.0)
MCV: 89.7 fL (ref 78.0–100.0)
MCV: 88.9 fL (ref 78.0–100.0)
PLATELETS: 259 10*3/uL (ref 150–400)
Platelets: 300 10*3/uL (ref 150–400)
Platelets: 225 10*3/uL (ref 150–400)
RBC: 5.16 MIL/uL (ref 4.22–5.81)
RBC: 4.27 MIL/uL (ref 4.22–5.81)
RBC: 4.68 MIL/uL (ref 4.22–5.81)
RDW: 13.2 % (ref 11.5–15.5)
RDW: 13.3 % (ref 11.5–15.5)
RDW: 13.4 % (ref 11.5–15.5)
WBC: 9 10*3/uL (ref 4.0–10.5)
WBC: 4.8 10*3/uL (ref 4.0–10.5)
WBC: 7.8 10*3/uL (ref 4.0–10.5)

## 2016-11-24 LAB — POTASSIUM
POTASSIUM: 5.2 mmol/L — AB (ref 3.5–5.1)
Potassium: 5.1 mmol/L (ref 3.5–5.1)

## 2016-11-24 LAB — GLUCOSE, CAPILLARY: Glucose-Capillary: 116 mg/dL — ABNORMAL HIGH (ref 65–99)

## 2016-11-24 LAB — COMPREHENSIVE METABOLIC PANEL
ALBUMIN: 3 g/dL — AB (ref 3.5–5.0)
ALT: 21 U/L (ref 17–63)
ANION GAP: 9 (ref 5–15)
AST: 29 U/L (ref 15–41)
Alkaline Phosphatase: 113 U/L (ref 38–126)
BUN: 15 mg/dL (ref 6–20)
CO2: 23 mmol/L (ref 22–32)
Calcium: 8.8 mg/dL — ABNORMAL LOW (ref 8.9–10.3)
Chloride: 98 mmol/L — ABNORMAL LOW (ref 101–111)
Creatinine, Ser: 0.97 mg/dL (ref 0.61–1.24)
GFR calc Af Amer: >60 mL/min (ref >60)
GFR calc non Af Amer: >60 mL/min (ref >60)
GLUCOSE: 116 mg/dL — AB (ref 65–99)
POTASSIUM: 6 mmol/L — AB (ref 3.5–5.1)
SODIUM: 130 mmol/L — AB (ref 135–145)
Total Bilirubin: 0.4 mg/dL (ref 0.3–1.2)
Total Protein: 6.8 g/dL (ref 6.5–8.1)

## 2016-11-24 LAB — OSMOLALITY, URINE: Osmolality, Ur: 358 mOsm/kg (ref 300–900)

## 2016-11-24 LAB — TRIGLYCERIDES: TRIGLYCERIDES: 47 mg/dL (ref <150)

## 2016-11-24 LAB — MRSA PCR SCREENING: MRSA BY PCR: NEGATIVE

## 2016-11-24 SURGERY — CREATION, TRACHEOSTOMY
Anesthesia: General

## 2016-11-24 MED ORDER — OXYCODONE HCL 5 MG PO TABS: 5.0000 mg | ORAL_TABLET | Freq: Once | ORAL | Status: DC | PRN

## 2016-11-24 MED ORDER — LOSARTAN POTASSIUM 50 MG PO TABS: 100.0000 mg | ORAL_TABLET | Freq: Every day | ORAL | Status: DC

## 2016-11-24 MED ORDER — MIDAZOLAM HCL 5 MG/5ML IJ SOLN
INTRAMUSCULAR | Status: DC | PRN
  Administered 2016-11-24: 2 mg via INTRAVENOUS

## 2016-11-24 MED ORDER — FENTANYL CITRATE (PF) 250 MCG/5ML IJ SOLN
INTRAMUSCULAR | Status: AC
  Filled 2016-11-24: qty 5

## 2016-11-24 MED ORDER — PROPOFOL 1000 MG/100ML IV EMUL
5.0000 ug/kg/min | Freq: Once | INTRAVENOUS | Status: DC
  Administered 2016-11-24: 50 ug/kg/min via INTRAVENOUS

## 2016-11-24 MED ORDER — FENTANYL CITRATE (PF) 100 MCG/2ML IJ SOLN
INTRAMUSCULAR | Status: AC | PRN
  Administered 2016-11-24: 100 ug via INTRAVENOUS

## 2016-11-24 MED ORDER — LACTATED RINGERS IV SOLN
INTRAVENOUS | Status: DC | PRN
  Administered 2016-11-24: 13:00:00 via INTRAVENOUS

## 2016-11-24 MED ORDER — ACETAMINOPHEN 325 MG PO TABS: 650.0000 mg | ORAL_TABLET | Freq: Four times a day (QID) | ORAL | Status: DC | PRN

## 2016-11-24 MED ORDER — OXYCODONE HCL 5 MG/5ML PO SOLN: 5.0000 mg | Freq: Once | ORAL | Status: DC | PRN

## 2016-11-24 MED ORDER — PROPOFOL 10 MG/ML IV BOLUS
INTRAVENOUS | Status: AC
  Filled 2016-11-24: qty 20

## 2016-11-24 MED ORDER — CEFAZOLIN SODIUM 1 G IJ SOLR
INTRAMUSCULAR | Status: AC
  Filled 2016-11-24: qty 20

## 2016-11-24 MED ORDER — SODIUM CHLORIDE 0.9 % IV SOLN: 250.0000 mL | INTRAVENOUS | Status: DC | PRN

## 2016-11-24 MED ORDER — SODIUM CHLORIDE 0.9 % IJ SOLN
INTRAMUSCULAR | Status: AC
  Filled 2016-11-24: qty 10

## 2016-11-24 MED ORDER — PHENYLEPHRINE HCL 10 MG/ML IJ SOLN
INTRAVENOUS | Status: DC | PRN
  Administered 2016-11-24: 25 ug/min via INTRAVENOUS

## 2016-11-24 MED ORDER — FAMOTIDINE IN NACL 20-0.9 MG/50ML-% IV SOLN
20.0000 mg | Freq: Two times a day (BID) | INTRAVENOUS | Status: DC
  Administered 2016-11-24 – 2016-11-25 (×3): 20 mg via INTRAVENOUS
  Filled 2016-11-24 (×5): qty 50

## 2016-11-24 MED ORDER — PROPOFOL 1000 MG/100ML IV EMUL
INTRAVENOUS | Status: AC
  Filled 2016-11-24: qty 100

## 2016-11-24 MED ORDER — HYDROMORPHONE HCL 1 MG/ML IJ SOLN: 0.2500 mg | INTRAMUSCULAR | Status: DC | PRN

## 2016-11-24 MED ORDER — PROMETHAZINE HCL 25 MG/ML IJ SOLN: 6.2500 mg | INTRAMUSCULAR | Status: DC | PRN

## 2016-11-24 MED ORDER — PROPOFOL 1000 MG/100ML IV EMUL
5.0000 ug/kg/min | INTRAVENOUS | Status: DC
  Administered 2016-11-24: 15 ug/kg/min via INTRAVENOUS
  Administered 2016-11-25: 10 ug/kg/min via INTRAVENOUS
  Filled 2016-11-24 (×3): qty 100

## 2016-11-24 MED ORDER — ORAL CARE MOUTH RINSE
15.0000 mL | Freq: Four times a day (QID) | OROMUCOSAL | Status: DC
  Administered 2016-11-24 – 2016-11-27 (×8): 15 mL via OROMUCOSAL

## 2016-11-24 MED ORDER — CHLORHEXIDINE GLUCONATE 0.12% ORAL RINSE (MEDLINE KIT)
15.0000 mL | Freq: Two times a day (BID) | OROMUCOSAL | Status: DC
  Administered 2016-11-24 – 2016-11-27 (×6): 15 mL via OROMUCOSAL

## 2016-11-24 MED ORDER — FENTANYL CITRATE (PF) 100 MCG/2ML IJ SOLN
INTRAMUSCULAR | Status: AC
  Filled 2016-11-24: qty 2

## 2016-11-24 MED ORDER — DOPAMINE-DEXTROSE 1.6-5 MG/ML-% IV SOLN
INTRAVENOUS | Status: DC | PRN
  Administered 2016-11-24: 10 ug/kg/min via INTRAVENOUS

## 2016-11-24 MED ORDER — SODIUM CHLORIDE 0.9 % IV BOLUS (SEPSIS)
500.0000 mL | Freq: Once | INTRAVENOUS | Status: AC
  Administered 2016-11-24: 500 mL via INTRAVENOUS

## 2016-11-24 MED ORDER — LIDOCAINE-EPINEPHRINE 1 %-1:100000 IJ SOLN
INTRAMUSCULAR | Status: AC
  Filled 2016-11-24: qty 1

## 2016-11-24 MED ORDER — FENTANYL CITRATE (PF) 100 MCG/2ML IJ SOLN: 50.0000 ug | INTRAMUSCULAR | Status: DC | PRN

## 2016-11-24 MED ORDER — ROCURONIUM BROMIDE 50 MG/5ML IV SOLN
INTRAVENOUS | Status: AC
  Filled 2016-11-24: qty 1

## 2016-11-24 MED ORDER — MIDAZOLAM HCL 2 MG/2ML IJ SOLN: 1.0000 mg | INTRAMUSCULAR | Status: DC | PRN

## 2016-11-24 MED ORDER — SODIUM CHLORIDE 0.9 % IV SOLN
Freq: Once | INTRAVENOUS | Status: AC
  Administered 2016-11-24: 1000 mL via INTRAVENOUS

## 2016-11-24 MED ORDER — LIDOCAINE 2% (20 MG/ML) 5 ML SYRINGE
INTRAMUSCULAR | Status: AC
  Filled 2016-11-24: qty 5

## 2016-11-24 MED ORDER — ONDANSETRON HCL 4 MG/2ML IJ SOLN
4.0000 mg | Freq: Four times a day (QID) | INTRAMUSCULAR | Status: DC | PRN
Start: 2016-11-24 — End: 2016-11-27

## 2016-11-24 MED ORDER — 0.9 % SODIUM CHLORIDE (POUR BTL) OPTIME
TOPICAL | Status: DC | PRN
  Administered 2016-11-24: 1000 mL

## 2016-11-24 MED ORDER — ROCURONIUM BROMIDE 100 MG/10ML IV SOLN
INTRAVENOUS | Status: DC | PRN
  Administered 2016-11-24: 50 mg via INTRAVENOUS

## 2016-11-24 MED ORDER — MIDAZOLAM HCL 2 MG/2ML IJ SOLN
1.0000 mg | INTRAMUSCULAR | Status: DC | PRN
Start: 2016-11-24 — End: 2016-11-26

## 2016-11-24 MED ORDER — MIDAZOLAM HCL 5 MG/5ML IJ SOLN
INTRAMUSCULAR | Status: AC | PRN
  Administered 2016-11-24: 2 mg via INTRAVENOUS

## 2016-11-24 MED ORDER — ONDANSETRON HCL 4 MG/2ML IJ SOLN
INTRAMUSCULAR | Status: DC | PRN
  Administered 2016-11-24: 4 mg via INTRAVENOUS

## 2016-11-24 MED ORDER — MIDAZOLAM HCL 2 MG/2ML IJ SOLN
INTRAMUSCULAR | Status: AC
  Filled 2016-11-24: qty 2

## 2016-11-24 MED ORDER — SODIUM POLYSTYRENE SULFONATE 15 GM/60ML PO SUSP
15.0000 g | Freq: Once | ORAL | Status: AC
  Administered 2016-11-24: 15 g via ORAL
  Filled 2016-11-24: qty 60

## 2016-11-24 MED ORDER — CEFAZOLIN SODIUM-DEXTROSE 2-3 GM-% IV SOLR
INTRAVENOUS | Status: DC | PRN
  Administered 2016-11-24: 2 g via INTRAVENOUS

## 2016-11-24 MED ORDER — DOPAMINE-DEXTROSE 3.2-5 MG/ML-% IV SOLN
INTRAVENOUS | Status: AC
  Filled 2016-11-24: qty 250

## 2016-11-24 MED ORDER — DOPAMINE-DEXTROSE 3.2-5 MG/ML-% IV SOLN
0.0000 ug/kg/min | Freq: Once | INTRAVENOUS | Status: AC
  Administered 2016-11-24: 5 ug/kg/min via INTRAVENOUS

## 2016-11-24 MED ORDER — PROPOFOL 500 MG/50ML IV EMUL
INTRAVENOUS | Status: DC | PRN
  Administered 2016-11-24: 50 ug/kg/min via INTRAVENOUS

## 2016-11-24 SURGICAL SUPPLY — 38 items
BLADE CLIPPER SURG (BLADE) IMPLANT
BLADE SURG 15 STRL LF DISP TIS (BLADE) IMPLANT
BLADE SURG 15 STRL SS (BLADE)
CANISTER SUCT 3000ML PPV (MISCELLANEOUS) ×4 IMPLANT
CLEANER TIP ELECTROSURG 2X2 (MISCELLANEOUS) ×4 IMPLANT
COVER SURGICAL LIGHT HANDLE (MISCELLANEOUS) ×4 IMPLANT
DECANTER SPIKE VIAL GLASS SM (MISCELLANEOUS) ×4 IMPLANT
DRAPE HALF SHEET 40X57 (DRAPES) IMPLANT
ELECT COATED BLADE 2.86 ST (ELECTRODE) ×4 IMPLANT
ELECT REM PT RETURN 9FT ADLT (ELECTROSURGICAL) ×4
ELECTRODE REM PT RTRN 9FT ADLT (ELECTROSURGICAL) ×2 IMPLANT
GAUZE SPONGE 4X4 12PLY STRL LF (GAUZE/BANDAGES/DRESSINGS) ×4 IMPLANT
GAUZE SPONGE 4X4 16PLY XRAY LF (GAUZE/BANDAGES/DRESSINGS) IMPLANT
GAUZE XEROFORM 5X9 LF (GAUZE/BANDAGES/DRESSINGS) IMPLANT
GLOVE BIOGEL M 7.0 STRL (GLOVE) ×16 IMPLANT
GOWN STRL REUS W/ TWL LRG LVL3 (GOWN DISPOSABLE) ×6 IMPLANT
GOWN STRL REUS W/TWL LRG LVL3 (GOWN DISPOSABLE) ×6
HOLDER TRACH TUBE VELCRO 19.5 (MISCELLANEOUS) ×4 IMPLANT
KIT BASIN OR (CUSTOM PROCEDURE TRAY) ×4 IMPLANT
KIT ROOM TURNOVER OR (KITS) ×4 IMPLANT
KIT SUCTION CATH 14FR (SUCTIONS) IMPLANT
NEEDLE HYPO 25GX1X1/2 BEV (NEEDLE) ×4 IMPLANT
NS IRRIG 1000ML POUR BTL (IV SOLUTION) ×4 IMPLANT
PAD ARMBOARD 7.5X6 YLW CONV (MISCELLANEOUS) ×8 IMPLANT
PENCIL BUTTON HOLSTER BLD 10FT (ELECTRODE) ×4 IMPLANT
SOLUTION ANTI FOG 6CC (MISCELLANEOUS) ×4 IMPLANT
SPONGE INTESTINAL PEANUT (DISPOSABLE) IMPLANT
SUT CHROMIC 2 0 SH (SUTURE) ×4 IMPLANT
SUT ETHILON 2 0 FS 18 (SUTURE) ×8 IMPLANT
SUT SILK 2 0 FS (SUTURE) ×4 IMPLANT
SUT SILK 3 0 REEL (SUTURE) ×4 IMPLANT
SYR 20CC LL (SYRINGE) ×4 IMPLANT
SYR BULB IRRIGATION 50ML (SYRINGE) IMPLANT
SYR CONTROL 10ML LL (SYRINGE) ×4 IMPLANT
TRAY ENT MC OR (CUSTOM PROCEDURE TRAY) ×4 IMPLANT
TUBE TRACH 6 EXL  PROX UNCUF (TUBING) ×2
TUBE TRACH 6 EXL PROX UNCUF (TUBING) ×2 IMPLANT
WATER STERILE IRR 1000ML POUR (IV SOLUTION) ×4 IMPLANT

## 2016-11-24 NOTE — Procedures (Signed)
Bronchoscopy Procedure Note Nicholas Cobb [id number] 05/10/1950  Procedure: Bronchoscopy Indications: Remove secretions  Procedure Details Consent: Unable to obtain consent because of emergent medical necessity. Time Out: Verified patient identification, verified procedure, site/side was marked, verified correct patient position, special equipment/implants available, medications/allergies/relevent history reviewed, required imaging and test results available.  Performed  In preparation for procedure, patient was given 100% FiO2 and bronchoscope lubricated. Sedation: propofol  Airway entered and the following bronchi were examined: RUL, RML, RLL, LUL, LLL and Bronchi.   Procedures performed: Brushings performed- no Bronchoscope removed.  , Patient placed back on 100% FiO2 at conclusion of procedure.    Evaluation Hemodynamic Status: BP stable throughout; O2 sats: stable throughout Patient's Current Condition: stable Specimens:  None Complications: No apparent complications Patient did tolerate procedure well.   Raylene Miyamoto. 11/24/2016   Bleeding continued. Some desatuation. Placed bronch again through new trach to ensure no collapse blood active  1. Trach well placed 2. Mild bloody secretions in rt main,sutioned clear succesfully  Lavon Paganini. Titus Mould, MD, [place] Pgr: [facility]

## 2016-11-24 NOTE — ED Notes (Signed)
Patient transported to OR with RRT and Janett Billow, Therapist, sports. Belongings labeled and given to security to hold until patient is out of surgery and in a room.
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
## 2016-11-24 NOTE — ED Notes (Signed)
Pt bp significantly dropped into 82'U systolic, propofol turned down to 69mck/kg an 1L NS bolus started. MD shoemaker at bedside states will be ready for OR in next 20 minutes, or to call when ready.

## 2016-11-24 NOTE — ED Notes (Signed)
Was difficult to reinsert size 6 trach bleeding from site and due to difficultly with airway size 4 was placed instead. Pt is sedated and placed on ventilator.

## 2016-11-24 NOTE — ED Notes (Signed)
OR called now that bP is more stable pt is ready for transported. OR states they will call as soon as pt is able to come.

## 2016-11-24 NOTE — ED Notes (Signed)
MD Titus Mould at bedside preparing to reinsert trach with ambu scope.
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
## 2016-11-24 NOTE — Progress Notes (Signed)
RT assisted MD at bedside with trach insertion. Attempted to replace trach of same size 6.0 XLTP but was unable to get in, even with bronchoscope. MD placed 4.0 cuffed shiley using bronchoscope. Pt was sedated and placed on vent. Will cont to monitor
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
## 2016-11-24 NOTE — ED Notes (Signed)
32 $ and debit card and wallet locked up with security.

## 2016-11-24 NOTE — ED Notes (Signed)
MD feinstein called to make aware pt bp is 58/45 even after turning propofol down and now off. Verbal order to start dopamine.

## 2016-11-24 NOTE — Procedures (Signed)
Bronchoscopy Procedure Note Nicholas Cobb [id number] 1951-01-25  Procedure: Bronchoscopy Indications: to replace trach with direct visualization  Procedure Details Consent: Risks of procedure as well as the alternatives and risks of each were explained to the (patient/caregiver).  Consent for procedure obtained. Time Out: Verified patient identification, verified procedure, site/side was marked, verified correct patient position, special equipment/implants available, medications/allergies/relevent history reviewed, required imaging and test results available.  Performed  In preparation for procedure, patient was given 100% FiO2 and bronchoscope lubricated. Sedation: none at start needed  Airway entered and the following bronchi were examined: RUL, RML, RLL and Bronchi.   Procedures performed: Brushings performed - no Bronchoscope removed.  , Patient placed back on 100% FiO2 at conclusion of procedure.    Evaluation Hemodynamic Status: Transient hypertension requiring treatment; O2 sats: transiently fell during during procedure Patient's Current Condition: unstable Specimens:  None Complications: he had bleeding significant See below   FEINSTEIN,DANIEL J. 11/24/2016   Placed bronch in fistula and entered airway. Had some fibrinous tissue and old secretions, able to see carina, had loaded 6 trach extra long on bronch and attempted to place, had significant bleeding and was unable to pass , so removed all. His bleeding worsened and desat to 50%, well known to me , no real available upper airway to place ett. Sedated mild. Placed 4 cuffed over bronch and successfully entered again airway and easier placed 4 cuffless. All direct visualization. Never entered false track or mediastinum.  sats improved  Lavon Paganini. Titus Mould, MD, [place] [facility]: [facility]

## 2016-11-24 NOTE — Transfer of Care (Signed)
Immediate Anesthesia Transfer of Care Note  Patient: Nicholas Cobb  Procedure(s) Performed: EMERGENT TRACHEOSTOMY CHANGE  Patient Location: ICU  Anesthesia Type:General  Level of Consciousness: Patient remains intubated per anesthesia plan, tracheostomy intact   Airway & Oxygen Therapy: Patient remains intubated per anesthesia plan and Patient placed on Ventilator (see vital sign flow sheet for setting)  Post-op Assessment: Report given to RN and Post -op Vital signs reviewed and stable  Post vital signs: Reviewed and stable  Last Vitals:  Vitals:   11/24/16 1239 11/24/16 1242  BP: (!) 149/92 (!) 143/94  Pulse:    Resp: 17 17  SpO2:      Last Pain: There were no vitals filed for this visit.       Complications: No apparent anesthesia complications

## 2016-11-24 NOTE — Anesthesia Preprocedure Evaluation (Addendum)
Anesthesia Evaluation  Patient identified by MRN, date of birth, ID band Patient awake    Reviewed: Allergy & Precautions, NPO status , Patient's Chart, lab work & pertinent test results  Airway Mallampati: II  TM Distance: >3 FB Neck ROM: Full    Dental no notable dental hx.    Pulmonary neg pulmonary ROS, COPD, former smoker,    Pulmonary exam normal breath sounds clear to auscultation       Cardiovascular hypertension, Pt. on medications negative cardio ROS Normal cardiovascular exam Rhythm:Regular Rate:Normal     Neuro/Psych Depression negative neurological ROS  negative psych ROS   GI/Hepatic negative GI ROS, Neg liver ROS, GERD  ,  Endo/Other  negative endocrine ROSdiabetes, Type 2  Renal/GU negative Renal ROS  negative genitourinary   Musculoskeletal negative musculoskeletal ROS (+)   Abdominal   Peds negative pediatric ROS (+)  Hematology negative hematology ROS (+)   Anesthesia Other Findings Tracheostomy  Reproductive/Obstetrics negative OB ROS                             Anesthesia Physical Anesthesia Plan  ASA: III and emergent  Anesthesia Plan: General   Post-op Pain Management:    Induction: Intravenous  PONV Risk Score and Plan: 2 and Ondansetron and Treatment may vary due to age or medical condition  Airway Management Planned: Tracheostomy  Additional Equipment:   Intra-op Plan:   Post-operative Plan: Extubation in OR  Informed Consent: I have reviewed the patients History and Physical, chart, labs and discussed the procedure including the risks, benefits and alternatives for the proposed anesthesia with the patient or authorized representative who has indicated his/her understanding and acceptance.   Dental advisory given  Plan Discussed with: CRNA  Anesthesia Plan Comments:        Anesthesia Quick Evaluation

## 2016-11-25 ENCOUNTER — Inpatient Hospital Stay (HOSPITAL_COMMUNITY): Payer: Medicare Other

## 2016-11-25 DIAGNOSIS — J9500 Unspecified tracheostomy complication: Secondary | ICD-10-CM

## 2016-11-25 LAB — CBC
HCT: 38.6 % — ABNORMAL LOW (ref 39.0–52.0)
Hemoglobin: 12.6 g/dL — ABNORMAL LOW (ref 13.0–17.0)
MCH: 29.2 pg (ref 26.0–34.0)
MCHC: 32.6 g/dL (ref 30.0–36.0)
MCV: 89.4 fL (ref 78.0–100.0)
PLATELETS: 216 10*3/uL (ref 150–400)
RBC: 4.32 MIL/uL (ref 4.22–5.81)
RDW: 13.4 % (ref 11.5–15.5)
WBC: 6.9 10*3/uL (ref 4.0–10.5)

## 2016-11-25 LAB — PHOSPHORUS
Phosphorus: 3.1 mg/dL (ref 2.5–4.6)
Phosphorus: 2.9 mg/dL (ref 2.5–4.6)

## 2016-11-25 LAB — BASIC METABOLIC PANEL
ANION GAP: 9 (ref 5–15)
BUN: 14 mg/dL (ref 6–20)
CALCIUM: 8.8 mg/dL — AB (ref 8.9–10.3)
CO2: 24 mmol/L (ref 22–32)
Chloride: 101 mmol/L (ref 101–111)
Creatinine, Ser: 0.88 mg/dL (ref 0.61–1.24)
GFR calc Af Amer: >60 mL/min (ref >60)
Glucose, Bld: 75 mg/dL (ref 65–99)
POTASSIUM: 4.6 mmol/L (ref 3.5–5.1)
SODIUM: 134 mmol/L — AB (ref 135–145)

## 2016-11-25 LAB — GLUCOSE, CAPILLARY
GLUCOSE-CAPILLARY: 117 mg/dL — AB (ref 65–99)
GLUCOSE-CAPILLARY: 98 mg/dL (ref 65–99)

## 2016-11-25 LAB — POTASSIUM
POTASSIUM: 4.6 mmol/L (ref 3.5–5.1)
POTASSIUM: 3.7 mmol/L (ref 3.5–5.1)
POTASSIUM: 3.6 mmol/L (ref 3.5–5.1)
POTASSIUM: 3.7 mmol/L (ref 3.5–5.1)
Potassium: 3.6 mmol/L (ref 3.5–5.1)

## 2016-11-25 LAB — MAGNESIUM
Magnesium: 2.1 mg/dL (ref 1.7–2.4)
Magnesium: 2 mg/dL (ref 1.7–2.4)

## 2016-11-25 MED ORDER — JEVITY 1.2 CAL PO LIQD
480.0000 mL | Freq: Four times a day (QID) | ORAL | Status: DC
  Administered 2016-11-25 – 2016-11-27 (×7): 480 mL
  Filled 2016-11-25 (×2): qty 711
  Filled 2016-11-25 (×4): qty 1000
  Filled 2016-11-25: qty 711
  Filled 2016-11-25: qty 1000
  Filled 2016-11-25: qty 711
  Filled 2016-11-25 (×3): qty 1000
  Filled 2016-11-25: qty 711

## 2016-11-25 MED ORDER — JEVITY 1.2 CAL/FIBER PO LIQD
480.0000 mL | ORAL | Status: DC
  Filled 2016-11-25 (×5): qty 2

## 2016-11-25 MED ORDER — ALBUTEROL SULFATE (2.5 MG/3ML) 0.083% IN NEBU
INHALATION_SOLUTION | RESPIRATORY_TRACT | Status: AC
  Administered 2016-11-25: 2.5 mg via RESPIRATORY_TRACT
  Filled 2016-11-25: qty 3

## 2016-11-25 MED ORDER — WHITE PETROLATUM GEL
Status: AC
  Administered 2016-11-25: 20:00:00
  Filled 2016-11-25: qty 1

## 2016-11-25 MED ORDER — SALINE SPRAY 0.65 % NA SOLN
1.0000 | NASAL | Status: DC | PRN
  Administered 2016-11-25 – 2016-11-26 (×2): 1 via NASAL
  Filled 2016-11-25: qty 44

## 2016-11-25 MED ORDER — PRO-STAT SUGAR FREE PO LIQD
30.0000 mL | Freq: Two times a day (BID) | ORAL | Status: DC
  Filled 2016-11-25: qty 30

## 2016-11-25 MED ORDER — ALBUTEROL SULFATE (2.5 MG/3ML) 0.083% IN NEBU
2.5000 mg | INHALATION_SOLUTION | RESPIRATORY_TRACT | Status: DC | PRN
  Administered 2016-11-25 – 2016-11-26 (×4): 2.5 mg via RESPIRATORY_TRACT
  Filled 2016-11-25 (×3): qty 3

## 2016-11-25 MED ORDER — VITAL HIGH PROTEIN PO LIQD
1000.0000 mL | ORAL | Status: DC
  Administered 2016-11-25: 15:00:00
  Administered 2016-11-25: 1000 mL

## 2016-11-25 NOTE — Progress Notes (Signed)
   ENT Progress Note: POD #1 s/p Procedure(s): EMERGENT TRACHEOSTOMY CHANGE   Subjective: Patient stable, he is awake and alert  Objective: Vital signs in last 24 hours: Temp:  [96.7 F (35.9 C)-97.8 F (36.6 C)] 96.7 F (35.9 C) (10/13 0817) Pulse Rate:  [56-111] 69 (10/13 0900) Resp:  [9-25] 23 (10/13 0900) BP: (48-176)/(34-131) 176/106 (10/13 0900) SpO2:  [94 %-100 %] 94 % (10/13 0900) FiO2 (%):  [40 %-100 %] 40 % (10/13 0814) Weight:  [70 kg (154 lb 5.2 oz)-72.6 kg (160 lb 0.9 oz)] 72.6 kg (160 lb 0.9 oz) (10/13 0500) Weight change:  Last BM Date:  (PTA)  Intake/Output from previous day: 10/12 0701 - 10/13 0700 In: 1477.6 [I.V.:1427.6; IV Piggyback:50] Out: 1077 [Urine:1075; Blood:2] Intake/Output this shift: Total I/O In: 74.2 [I.V.:24.2; IV Piggyback:50] Out: 625 [Urine:625]  Labs:  Recent Labs  11/24/16 1815 11/25/16 0228  WBC 7.8 6.9  HGB 14.2 12.6*  HCT 41.6 38.6*  PLT 225 216    Recent Labs  11/24/16 1503  11/25/16 0228 11/25/16 0704  NA 130*  --  134*  --   K 6.0*  < > 4.6 4.6  CL 98*  --  101  --   CO2 23  --  24  --   GLUCOSE 116*  --  75  --   BUN 15  --  14  --   CALCIUM 8.8*  --  8.8*  --   < > = values in this interval not displayed.  Studies/Results: Dg Chest Port 1 View  Result Date: 11/25/2016 CLINICAL DATA:  Tracheostomy dislodgement. EXAM: PORTABLE CHEST 1 VIEW COMPARISON:  Radiograph of November 24, 2016. FINDINGS: The heart size and mediastinal contours are within normal limits. Tracheostomy is in grossly good position. Right lung is clear. No pneumothorax is noted. Mild left basilar atelectasis or infiltrate is noted with minimal pleural effusion. Bony thorax is unremarkable. IMPRESSION: Tracheostomy tube in grossly good position. Mild left basilar atelectasis or infiltrate is noted with minimal pleural effusion Electronically Signed   By: Marijo Conception, M.D.   On: 11/25/2016 07:49   Dg Chest Portable 1 View  Result Date:  11/24/2016 CLINICAL DATA:  Tracheostomy tube replacement EXAM: PORTABLE CHEST 1 VIEW COMPARISON:  03/28/2016 FINDINGS: A tracheostomy tube is in place. The tip is 8.2 cm from the carina. Normal heart size. Clear lungs. No pneumothorax or pleural effusion. Normal vascularity. IMPRESSION: Tracheostomy tube in place.  No active cardiopulmonary disease. Electronically Signed   By: Marybelle Killings M.D.   On: 11/24/2016 10:51     PHYSICAL EXAM: #6 Shiley XLT tracheostomy tube in proper position, patient with good airway and gas exchange. No bleeding.   Assessment/Plan: Patient stable at this time, airway intact with his current tracheostomy tube in position. There is no further bleeding and no active airway concerns. Patient will remain hospitalized until his home situation is stable. I will sign off now, please reconsult for any further concerns. Patient will continue his follow-up at the [facility]. He will also continue follow-up at [facility] for his head and neck cancer care.    B[place]coe, Reegan Bouffard 11/25/2016, 10:37 AM
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
## 2016-11-25 NOTE — Progress Notes (Signed)
Patient placed on 40% trach collar.  Currently tolerating well.  Will continue to monitor.
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
## 2016-11-26 DIAGNOSIS — R0600 Dyspnea, unspecified: Secondary | ICD-10-CM

## 2016-11-26 LAB — GLUCOSE, CAPILLARY
GLUCOSE-CAPILLARY: 120 mg/dL — AB (ref 65–99)
GLUCOSE-CAPILLARY: 133 mg/dL — AB (ref 65–99)
GLUCOSE-CAPILLARY: 89 mg/dL (ref 65–99)
Glucose-Capillary: 76 mg/dL (ref 65–99)
Glucose-Capillary: 113 mg/dL — ABNORMAL HIGH (ref 65–99)
Glucose-Capillary: 131 mg/dL — ABNORMAL HIGH (ref 65–99)

## 2016-11-26 LAB — CBC
HEMATOCRIT: 41.5 % (ref 39.0–52.0)
HEMOGLOBIN: 13.8 g/dL (ref 13.0–17.0)
MCH: 29.6 pg (ref 26.0–34.0)
MCHC: 33.3 g/dL (ref 30.0–36.0)
MCV: 89.1 fL (ref 78.0–100.0)
Platelets: 256 10*3/uL (ref 150–400)
RBC: 4.66 MIL/uL (ref 4.22–5.81)
RDW: 13.4 % (ref 11.5–15.5)
WBC: 5.6 10*3/uL (ref 4.0–10.5)

## 2016-11-26 LAB — BASIC METABOLIC PANEL
ANION GAP: 8 (ref 5–15)
BUN: 11 mg/dL (ref 6–20)
CHLORIDE: 95 mmol/L — AB (ref 101–111)
CO2: 30 mmol/L (ref 22–32)
Calcium: 8.7 mg/dL — ABNORMAL LOW (ref 8.9–10.3)
Creatinine, Ser: 0.85 mg/dL (ref 0.61–1.24)
GFR calc Af Amer: >60 mL/min (ref >60)
GFR calc non Af Amer: >60 mL/min (ref >60)
GLUCOSE: 84 mg/dL (ref 65–99)
POTASSIUM: 3.6 mmol/L (ref 3.5–5.1)
Sodium: 133 mmol/L — ABNORMAL LOW (ref 135–145)

## 2016-11-26 LAB — PHOSPHORUS: PHOSPHORUS: 3.1 mg/dL (ref 2.5–4.6)

## 2016-11-26 LAB — MAGNESIUM: Magnesium: 2 mg/dL (ref 1.7–2.4)

## 2016-11-26 MED ORDER — ACETAMINOPHEN 160 MG/5ML PO SOLN: 650.0000 mg | Freq: Four times a day (QID) | ORAL | Status: DC | PRN

## 2016-11-26 MED ORDER — FAMOTIDINE 20 MG PO TABS
20.0000 mg | ORAL_TABLET | Freq: Two times a day (BID) | ORAL | Status: DC
  Administered 2016-11-26 – 2016-11-27 (×3): 20 mg
  Filled 2016-11-26 (×3): qty 1

## 2016-11-26 MED ORDER — NAPHAZOLINE-PHENIRAMINE 0.025-0.3 % OP SOLN
2.0000 [drp] | Freq: Four times a day (QID) | OPHTHALMIC | Status: DC | PRN
  Administered 2016-11-27: 2 [drp] via OPHTHALMIC
  Filled 2016-11-26: qty 5

## 2016-11-26 MED ORDER — IBUPROFEN 200 MG PO TABS: 400.0000 mg | ORAL_TABLET | Freq: Four times a day (QID) | ORAL | Status: DC | PRN

## 2016-11-26 MED ORDER — ALBUTEROL SULFATE (2.5 MG/3ML) 0.083% IN NEBU
2.5000 mg | INHALATION_SOLUTION | RESPIRATORY_TRACT | Status: DC | PRN
  Administered 2016-11-26 – 2016-11-27 (×4): 2.5 mg via RESPIRATORY_TRACT
  Filled 2016-11-26 (×4): qty 3
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
## 2016-11-27 ENCOUNTER — Encounter (HOSPITAL_COMMUNITY): Payer: Self-pay | Admitting: Otolaryngology

## 2016-11-27 DIAGNOSIS — J9509 Other tracheostomy complication: Secondary | ICD-10-CM

## 2016-11-27 DIAGNOSIS — J9503 Malfunction of tracheostomy stoma: Principal | ICD-10-CM

## 2016-11-27 DIAGNOSIS — R0603 Acute respiratory distress: Secondary | ICD-10-CM

## 2016-11-27 LAB — GLUCOSE, CAPILLARY
GLUCOSE-CAPILLARY: 95 mg/dL (ref 65–99)
Glucose-Capillary: 112 mg/dL — ABNORMAL HIGH (ref 65–99)
Glucose-Capillary: 94 mg/dL (ref 65–99)

## 2016-11-27 LAB — CBC
HEMATOCRIT: 42 % (ref 39.0–52.0)
HEMOGLOBIN: 13.9 g/dL (ref 13.0–17.0)
MCH: 29.1 pg (ref 26.0–34.0)
MCHC: 33.1 g/dL (ref 30.0–36.0)
MCV: 88.1 fL (ref 78.0–100.0)
Platelets: 263 10*3/uL (ref 150–400)
RBC: 4.77 MIL/uL (ref 4.22–5.81)
RDW: 13.2 % (ref 11.5–15.5)
WBC: 5.8 10*3/uL (ref 4.0–10.5)

## 2016-11-27 LAB — HEMOGLOBIN A1C
Hgb A1c MFr Bld: 6.8 % — ABNORMAL HIGH (ref 4.8–5.6)
MEAN PLASMA GLUCOSE: 148.46 mg/dL

## 2016-11-27 LAB — COMPREHENSIVE METABOLIC PANEL
ALBUMIN: 3 g/dL — AB (ref 3.5–5.0)
ALT: 32 U/L (ref 17–63)
ANION GAP: 9 (ref 5–15)
AST: 42 U/L — ABNORMAL HIGH (ref 15–41)
Alkaline Phosphatase: 135 U/L — ABNORMAL HIGH (ref 38–126)
BILIRUBIN TOTAL: 0.6 mg/dL (ref 0.3–1.2)
BUN: 12 mg/dL (ref 6–20)
CO2: 27 mmol/L (ref 22–32)
Calcium: 8.8 mg/dL — ABNORMAL LOW (ref 8.9–10.3)
Chloride: 96 mmol/L — ABNORMAL LOW (ref 101–111)
Creatinine, Ser: 0.81 mg/dL (ref 0.61–1.24)
GFR calc Af Amer: >60 mL/min (ref >60)
GFR calc non Af Amer: >60 mL/min (ref >60)
GLUCOSE: 103 mg/dL — AB (ref 65–99)
POTASSIUM: 4 mmol/L (ref 3.5–5.1)
SODIUM: 132 mmol/L — AB (ref 135–145)
TOTAL PROTEIN: 7.2 g/dL (ref 6.5–8.1)

## 2016-11-27 MED ORDER — IBUPROFEN 400 MG PO TABS: ORAL_TABLET | ORAL | 3 refills | Status: DC

## 2016-11-27 MED ORDER — JEVITY 1.2 CAL PO LIQD: 474.0000 mL | Freq: Four times a day (QID) | ORAL | Status: AC

## 2016-11-27 MED ORDER — TRIAMCINOLONE ACETONIDE 0.1 % EX CREA: 1.0000 "application " | TOPICAL_CREAM | Freq: Two times a day (BID) | CUTANEOUS | Status: AC

## 2016-11-27 MED ORDER — RANITIDINE HCL 150 MG PO TABS: ORAL_TABLET | ORAL | 0 refills | Status: AC

## 2016-11-27 MED ORDER — LOSARTAN POTASSIUM 100 MG PO TABS: 100.0000 mg | ORAL_TABLET | Freq: Every day | ORAL | Status: AC

## 2016-11-27 NOTE — Anesthesia Postprocedure Evaluation (Signed)
Anesthesia Post Note  Patient: Nicholas Cobb  Procedure(s) Performed: EMERGENT TRACHEOSTOMY CHANGE     Anesthesia Post Evaluation  Last Vitals:  Vitals:   11/27/16 1002 11/27/16 1037  BP: (!) 169/104 (!) 171/112  Pulse: 78 86  Resp: (!) 25 (!) 23  Temp:    SpO2: 98% 94%    Last Pain:  Vitals:   11/27/16 0847  TempSrc: Oral   Pain Goal:                 Lynda Rainwater

## 2016-11-27 NOTE — Progress Notes (Signed)
CSW consulted by RN to determine if pt has power in home. CSW reached out to [employer] and [employer] (pt's apartment complex) top gather information on power in the apartment, however no answer at any numbers contacted. While CSW was in the room, pt received a call and was informed that from the looks of it there is power in the apartment. There are no further CSW interventions needed at this time. CSW signing off.     SUBJECTIVE:

## 2016-11-27 NOTE — Op Note (Signed)
NAME:  Nicholas Cobb, Nicholas Cobb                  ACCOUNT NO.:  MEDICAL RECORD NO.:  [id number]  LOCATION:                                 FACILITY:  PHYSICIAN:  Early Chars. Wilburn Cornelia, M.D.DATE OF BIRTH:  16-Apr-1950  DATE OF PROCEDURE:  11/24/2016 DATE OF DISCHARGE:                              OPERATIVE REPORT   LOCATION:  [facility] Main OR.  PREOPERATIVE DIAGNOSES:  POSTOPERATIVE DIAGNOSES: 1. Acute airway obstruction. 2. History of chronic tracheostomy. 3. History of extensive head and neck cancer, treated at [facility]. 4. Chronic neck and face lymphedema secondary to radiation therapy.  PROCEDURES: 1. Trans-tracheostomy, tracheoscopy. 2. Replacement of tracheostomy tube.  ANESTHESIA:  General endotracheal.  SURGEON:  Early Chars. Wilburn Cornelia, MD.  COMPLICATIONS:  None.  ESTIMATED BLOOD LOSS:  Minimal.  The patient was transferred from the operating room to the intensive care unit in stable condition.  BRIEF HISTORY:  The patient is a 66 year old black male with a history of extensive head and neck cancer, who underwent surgical resection at [facility] including airway resection and neck dissection.  The patient underwent postoperative chemoradiation therapy and had significant postoperative issues including osteoradionecrosis of the mandible with chronic lymphedema, facial swelling, and airway obstruction.  The patient has been followed at the [facility] with trach changes every other month without complication or difficulty.  The patient does his own home trach care.  Unfortunately, the patient lost power and was unable to suction his tracheostomy.  He removed the trach because of crusting and occlusion and then presented to the [facility] Emergency Department for further evaluation. The patient was seen by. Titus Mould, his pulmonary specialist in the emergency department and they were unable to replace the  patient's standard tracheostomy tube because of bleeding and pain.  A #4 cuffed trach was placed for emergency management, but this was too small for the patient's long-term care.  ENT Service was consulted for re- evaluation of the patient's airway and tracheostomy tube change.  SURGICAL PROCEDURE:  The patient was brought to the operating room on an emergency basis.  No consent was obtained as the patient was sedated and unable to respond.  A time-out was performed with correct identification of the patient and the surgical procedure.  He was then prepped, draped, and prepared for surgery.  The patient's airway was evaluated using the flexible laryngoscope through his existing #4 Shiley tracheostomy tube.  There was a moderate amount of bloody secretions within the tracheostomy tube lumen which were suctioned, but the tube itself was in good position and the patient's airway appeared normal with the exception of some bloody debris.  When the patient was adequately saturated, the #4 Shiley tracheostomy tube was removed.  The patient's stoma was inspected. There was no active bleeding.  There was a moderate amount of clotted material, which was suctioned and cleared.  Good visualization of the patient's trachea.  A #6 Shiley uncuffed XLT proximal extended trach was then placed without difficulty.  The patient had good respiratory exchange.  The flexible laryngoscope was passed through the lumen of the tracheostomy tube and the patient's airway  was re-assessed.  The tube was in good position.  The carina was visualized.  There was no active bleeding and no evidence of significant trauma.  The patient's tracheostomy tube was then secured with Velcro trach straps.  She was awakened from his anesthetic, was then transferred from the operating room to the ICU (unit to mid) in stable condition.          ______________________________ Early Chars Wilburn Cornelia, M.D.     DLS/MEDQ  D:   28/36/6294  T:  11/25/2016  Job:  [id number]

## 2016-11-27 NOTE — Care Management Note (Signed)
Case Management Note  Patient Details  Name: Nicholas Cobb MRN: [id number] Date of Birth: [birth year]  Subjective/Objective:        Pt admitted with trach obstruction           Action/Plan:   PTA completely independent from home alone.  Pt has had trach in the home and confirmed via written message is he completely independent, supplies are delivered to his home, medications are picked up by public transportation at local drug store, and he is active with PCP.  Pts neighbor agreed to stay with pt overnight and will continue to be support person (neighbor has been pts support person for some time) at discharge.  Pt informed CM via written message that he already has the following equipment supplied by [employer]; respirator (only use as needed), trach supplies and suction machine in the home.  Pt is on RA and denied needing portable oxygen tank for transport home vehicle driven by neighbor.  No CM needs determined prior to discharge   Expected Discharge Date:  11/27/16               Expected Discharge Plan:  Home/Self Care  In-House Referral:     Discharge planning Services  CM Consult  Post Acute Care Choice:    Choice offered to:     DME Arranged:    DME Agency:     HH Arranged:    [place] Agency:     Status of Service:  Completed, signed off  If discussed at [employer] of Stay Meetings, dates discussed:    Additional Comments:  Maryclare Labrador, RN 11/27/2016, 12:01 PM
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
## 2016-11-27 NOTE — Discharge Instructions (Signed)
How to Suction a Tracheostomy A tracheostomy, or trach, is a surgically created opening in the trachea. It is important to suction a trach from time to time. Doing this:  Removes mucus and other fluids that build up in the trachea.  Keeps the airway clear.  Makes it easier to breathe.  Two people may be needed to suction a trach. Supplies needed:  A suction catheter.  Clean gloves.  Sterile gloves.  A clean towel or paper drape.  A suction machine.  Connecting tubing.  Sterile container.  0.9% saline solution or sterile water. How to suction a trach When you suction a trach, make sure to follow any specific instructions that were given by the person's health care provider. 1. Have all supplies ready and available. 2. Wash your hands. 3. Put on clean gloves. 4. Attach one end of the connecting tubing to the suction machine. Place the other end next to the person who has a trach. 5. Turn on the suction machine. 6. Set the vacuum regulator to the appropriate negative pressure. 7. Have the person take deep breaths. 8. Prepare the suction catheter. While you do this, make sure the tube tip does not touch any non-sterile surface. 9. For a One-Time-Use Catheter: ? Open the catheter or kit. ? Lay a clean towel or paper drape across the person's chest. ? Unwrap or open the sterile container and put it on a nearby table. ? Pour the saline solution or sterile water in the container. ? Take off your gloves. ? Wash your hands. ? Put on sterile gloves. After you put these gloves on, do not touch any non-sterile surfaces. 10. For a Closed-Suction Catheter: ? Take off your gloves. ? Wash your hands. ? Put on sterile gloves. After you put these gloves on, do not touch any non-sterile surfaces. 11. Pick up the connecting tubing and the catheter, and attach the connecting tubing to the catheter tubing. 12. To check that all equipment is working as it should, try suctioning a small  amount of saline solution from the container. 13. Suction the trach. To do this: ? Give extra oxygen as needed. ? If the person is receiving mechanical ventilation, open the suction access (swivel adapter). If necessary, remove the oxygen or humidity delivery device. ? Without applying suction, gently and quickly insert the catheter into the trach using your thumb and forefinger. Try to do this at a time that you feel resistance or when the person coughs. As soon as you have inserted the catheter, pull it back  inch (1 cm). ? If the person is receiving mechanical ventilation, close the swivel adapter or replace the oxygen delivery device. ? Have the person take deep breaths. ? Rinse the catheter and the connecting tubing with saline solution or sterile water until it has been cleared. Use continuous suction. ? Repeat these steps one or two more times until the person no longer has noisy breathing. Pause for at least 1 minute before you repeat these steps. ? Suction secretions out of the mouth. 14. When suctioning is complete, disconnect the catheter from the connecting tubing. 15. Roll the catheter around your fingers. 16. Pull the glove off inside out so that catheter remains coiled in the glove. Pull off the other glove over the first glove in the same way. 17. Throw away your gloves. 18. Turn off the suction machine. 19. Remove the towel or paper drape. 20. Put on clean gloves. 21. Give extra oxygen as needed. 22. Throw away  any used supplies. 23. Remove your gloves. 24. Wash your hands.  This information is not intended to replace advice given to you by your health care provider. Make sure you discuss any questions you have with your health care provider. Document Released: 06/05/2006 Document Revised: 06/30/2015 Document Reviewed: 11/20/2014 Elsevier Interactive Patient Education  [employer].

## 2016-11-30 NOTE — Telephone Encounter (Signed)
Error- MM  This encounter was created in error - please disregard.
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
## 2016-12-05 DIAGNOSIS — C153 Malignant neoplasm of upper third of esophagus: Secondary | ICD-10-CM | POA: Diagnosis not present

## 2016-12-05 DIAGNOSIS — H9319 Tinnitus, unspecified ear: Secondary | ICD-10-CM | POA: Diagnosis not present

## 2016-12-05 DIAGNOSIS — Z930 Tracheostomy status: Secondary | ICD-10-CM | POA: Diagnosis not present

## 2016-12-05 DIAGNOSIS — C154 Malignant neoplasm of middle third of esophagus: Secondary | ICD-10-CM | POA: Diagnosis not present

## 2016-12-05 DIAGNOSIS — M8660 Other chronic osteomyelitis, unspecified site: Secondary | ICD-10-CM | POA: Diagnosis not present

## 2016-12-05 DIAGNOSIS — R609 Edema, unspecified: Secondary | ICD-10-CM | POA: Diagnosis not present

## 2016-12-08 DIAGNOSIS — Z930 Tracheostomy status: Secondary | ICD-10-CM | POA: Diagnosis not present

## 2016-12-08 DIAGNOSIS — C153 Malignant neoplasm of upper third of esophagus: Secondary | ICD-10-CM | POA: Diagnosis not present

## 2016-12-08 DIAGNOSIS — C154 Malignant neoplasm of middle third of esophagus: Secondary | ICD-10-CM | POA: Diagnosis not present

## 2016-12-18 DIAGNOSIS — J4531 Mild persistent asthma with (acute) exacerbation: Secondary | ICD-10-CM | POA: Diagnosis not present

## 2016-12-21 DIAGNOSIS — J4531 Mild persistent asthma with (acute) exacerbation: Secondary | ICD-10-CM | POA: Diagnosis not present

## 2016-12-28 DIAGNOSIS — C153 Malignant neoplasm of upper third of esophagus: Secondary | ICD-10-CM | POA: Diagnosis not present

## 2016-12-29 ENCOUNTER — Emergency Department (HOSPITAL_COMMUNITY): Payer: Medicare Other | Admitting: Certified Registered"

## 2016-12-29 ENCOUNTER — Emergency Department (HOSPITAL_COMMUNITY): Payer: Medicare Other

## 2016-12-29 ENCOUNTER — Inpatient Hospital Stay (HOSPITAL_COMMUNITY)
Admission: EM | Admit: 2016-12-29 | Discharge: 2016-12-29 | DRG: 168 | Disposition: A | Discharge status: Home / Self Care | Payer: Medicare Other | Attending: Pulmonary Disease | Admitting: Pulmonary Disease

## 2016-12-29 ENCOUNTER — Encounter (HOSPITAL_COMMUNITY)
Admission: EM | Disposition: A | Discharge status: Home / Self Care | Payer: Self-pay | Source: Home / Self Care | Attending: Pulmonary Disease

## 2016-12-29 ENCOUNTER — Encounter (HOSPITAL_COMMUNITY): Payer: Self-pay | Admitting: Emergency Medicine

## 2016-12-29 DIAGNOSIS — Z87891 Personal history of nicotine dependence: Secondary | ICD-10-CM | POA: Diagnosis not present

## 2016-12-29 DIAGNOSIS — K219 Gastro-esophageal reflux disease without esophagitis: Secondary | ICD-10-CM | POA: Diagnosis present

## 2016-12-29 DIAGNOSIS — Z885 Allergy status to narcotic agent status: Secondary | ICD-10-CM | POA: Diagnosis not present

## 2016-12-29 DIAGNOSIS — J329 Chronic sinusitis, unspecified: Secondary | ICD-10-CM | POA: Diagnosis not present

## 2016-12-29 DIAGNOSIS — G8929 Other chronic pain: Secondary | ICD-10-CM | POA: Diagnosis not present

## 2016-12-29 DIAGNOSIS — I890 Lymphedema, not elsewhere classified: Secondary | ICD-10-CM | POA: Diagnosis present

## 2016-12-29 DIAGNOSIS — J9509 Other tracheostomy complication: Principal | ICD-10-CM

## 2016-12-29 DIAGNOSIS — M272 Inflammatory conditions of jaws: Secondary | ICD-10-CM | POA: Diagnosis present

## 2016-12-29 DIAGNOSIS — Z8249 Family history of ischemic heart disease and other diseases of the circulatory system: Secondary | ICD-10-CM | POA: Diagnosis not present

## 2016-12-29 DIAGNOSIS — C140 Malignant neoplasm of pharynx, unspecified: Secondary | ICD-10-CM | POA: Diagnosis not present

## 2016-12-29 DIAGNOSIS — E119 Type 2 diabetes mellitus without complications: Secondary | ICD-10-CM | POA: Diagnosis not present

## 2016-12-29 DIAGNOSIS — F329 Major depressive disorder, single episode, unspecified: Secondary | ICD-10-CM | POA: Diagnosis present

## 2016-12-29 DIAGNOSIS — J398 Other specified diseases of upper respiratory tract: Secondary | ICD-10-CM | POA: Diagnosis present

## 2016-12-29 DIAGNOSIS — Y842 Radiological procedure and radiotherapy as the cause of abnormal reaction of the patient, or of later complication, without mention of misadventure at the time of the procedure: Secondary | ICD-10-CM | POA: Diagnosis not present

## 2016-12-29 DIAGNOSIS — I10 Essential (primary) hypertension: Secondary | ICD-10-CM | POA: Diagnosis not present

## 2016-12-29 DIAGNOSIS — R0603 Acute respiratory distress: Secondary | ICD-10-CM | POA: Diagnosis not present

## 2016-12-29 DIAGNOSIS — Z85819 Personal history of malignant neoplasm of unspecified site of lip, oral cavity, and pharynx: Secondary | ICD-10-CM

## 2016-12-29 DIAGNOSIS — Z79899 Other long term (current) drug therapy: Secondary | ICD-10-CM

## 2016-12-29 DIAGNOSIS — Y838 Other surgical procedures as the cause of abnormal reaction of the patient, or of later complication, without mention of misadventure at the time of the procedure: Secondary | ICD-10-CM | POA: Diagnosis not present

## 2016-12-29 DIAGNOSIS — J9503 Malfunction of tracheostomy stoma: Secondary | ICD-10-CM | POA: Diagnosis not present

## 2016-12-29 DIAGNOSIS — R1310 Dysphagia, unspecified: Secondary | ICD-10-CM | POA: Diagnosis present

## 2016-12-29 DIAGNOSIS — H9190 Unspecified hearing loss, unspecified ear: Secondary | ICD-10-CM | POA: Diagnosis present

## 2016-12-29 DIAGNOSIS — Z833 Family history of diabetes mellitus: Secondary | ICD-10-CM

## 2016-12-29 DIAGNOSIS — C329 Malignant neoplasm of larynx, unspecified: Secondary | ICD-10-CM | POA: Diagnosis not present

## 2016-12-29 DIAGNOSIS — R0602 Shortness of breath: Secondary | ICD-10-CM | POA: Diagnosis not present

## 2016-12-29 DIAGNOSIS — Z801 Family history of malignant neoplasm of trachea, bronchus and lung: Secondary | ICD-10-CM

## 2016-12-29 DIAGNOSIS — R0689 Other abnormalities of breathing: Secondary | ICD-10-CM | POA: Diagnosis not present

## 2016-12-29 DIAGNOSIS — J9690 Respiratory failure, unspecified, unspecified whether with hypoxia or hypercapnia: Secondary | ICD-10-CM | POA: Diagnosis not present

## 2016-12-29 DIAGNOSIS — Z888 Allergy status to other drugs, medicaments and biological substances status: Secondary | ICD-10-CM | POA: Diagnosis not present

## 2016-12-29 DIAGNOSIS — J439 Emphysema, unspecified: Secondary | ICD-10-CM | POA: Diagnosis not present

## 2016-12-29 DIAGNOSIS — R061 Stridor: Secondary | ICD-10-CM | POA: Diagnosis present

## 2016-12-29 HISTORY — PX: TRACHEOSTOMY TUBE PLACEMENT: SHX814

## 2016-12-29 LAB — CBC WITH DIFFERENTIAL/PLATELET
BASOS ABS: 0 10*3/uL (ref 0.0–0.1)
BASOS PCT: 0 %
Eosinophils Absolute: 0.1 10*3/uL (ref 0.0–0.7)
Eosinophils Relative: 3 %
HEMATOCRIT: 42.6 % (ref 39.0–52.0)
HEMOGLOBIN: 14.3 g/dL (ref 13.0–17.0)
LYMPHS PCT: 13 %
Lymphs Abs: 0.6 10*3/uL — ABNORMAL LOW (ref 0.7–4.0)
MCH: 30 pg (ref 26.0–34.0)
MCHC: 33.6 g/dL (ref 30.0–36.0)
MCV: 89.3 fL (ref 78.0–100.0)
Monocytes Absolute: 0.8 10*3/uL (ref 0.1–1.0)
Monocytes Relative: 19 %
NEUTROS ABS: 3 10*3/uL (ref 1.7–7.7)
NEUTROS PCT: 65 %
Platelets: 238 10*3/uL (ref 150–400)
RBC: 4.77 MIL/uL (ref 4.22–5.81)
RDW: 13.3 % (ref 11.5–15.5)
WBC: 4.5 10*3/uL (ref 4.0–10.5)

## 2016-12-29 LAB — BASIC METABOLIC PANEL
ANION GAP: 11 (ref 5–15)
BUN: 16 mg/dL (ref 6–20)
CALCIUM: 9.2 mg/dL (ref 8.9–10.3)
CO2: 25 mmol/L (ref 22–32)
Chloride: 93 mmol/L — ABNORMAL LOW (ref 101–111)
Creatinine, Ser: 0.77 mg/dL (ref 0.61–1.24)
GFR calc non Af Amer: >60 mL/min (ref >60)
GLUCOSE: 116 mg/dL — AB (ref 65–99)
POTASSIUM: 4.1 mmol/L (ref 3.5–5.1)
Sodium: 129 mmol/L — ABNORMAL LOW (ref 135–145)

## 2016-12-29 LAB — GLUCOSE, CAPILLARY
GLUCOSE-CAPILLARY: 104 mg/dL — AB (ref 65–99)
GLUCOSE-CAPILLARY: 81 mg/dL (ref 65–99)
Glucose-Capillary: 134 mg/dL — ABNORMAL HIGH (ref 65–99)
Glucose-Capillary: 114 mg/dL — ABNORMAL HIGH (ref 65–99)

## 2016-12-29 SURGERY — CREATION, TRACHEOSTOMY
Anesthesia: Monitor Anesthesia Care | Site: Neck

## 2016-12-29 MED ORDER — TRIAMCINOLONE ACETONIDE 0.5 % EX CREA
1.0000 "application " | TOPICAL_CREAM | Freq: Two times a day (BID) | CUTANEOUS | Status: DC
  Administered 2016-12-29: 1 via TOPICAL
  Filled 2016-12-29: qty 15

## 2016-12-29 MED ORDER — JEVITY 1.2 CAL PO LIQD
474.0000 mL | Freq: Four times a day (QID) | ORAL | Status: DC
  Filled 2016-12-29 (×7): qty 474

## 2016-12-29 MED ORDER — ALBUTEROL SULFATE (2.5 MG/3ML) 0.083% IN NEBU: 3.0000 mL | INHALATION_SOLUTION | RESPIRATORY_TRACT | Status: DC | PRN

## 2016-12-29 MED ORDER — FENTANYL CITRATE (PF) 250 MCG/5ML IJ SOLN
INTRAMUSCULAR | Status: DC | PRN
  Administered 2016-12-29: 50 ug via INTRAVENOUS

## 2016-12-29 MED ORDER — 0.9 % SODIUM CHLORIDE (POUR BTL) OPTIME
TOPICAL | Status: DC | PRN
  Administered 2016-12-29: 1000 mL

## 2016-12-29 MED ORDER — SODIUM CHLORIDE 0.9 % IV SOLN: 250.0000 mL | INTRAVENOUS | Status: DC | PRN

## 2016-12-29 MED ORDER — LOSARTAN POTASSIUM 50 MG PO TABS
100.0000 mg | ORAL_TABLET | Freq: Every day | ORAL | Status: DC
  Administered 2016-12-29: 100 mg
  Filled 2016-12-29: qty 2

## 2016-12-29 MED ORDER — JEVITY 1.2 CAL PO LIQD
474.0000 mL | Freq: Four times a day (QID) | ORAL | Status: DC
  Administered 2016-12-29: 474 mL
  Filled 2016-12-29 (×5): qty 1000

## 2016-12-29 MED ORDER — PANTOPRAZOLE SODIUM 40 MG IV SOLR
40.0000 mg | INTRAVENOUS | Status: DC
  Administered 2016-12-29: 40 mg via INTRAVENOUS
  Filled 2016-12-29: qty 40

## 2016-12-29 MED ORDER — CHLORHEXIDINE GLUCONATE 0.12 % MT SOLN: 15.0000 mL | Freq: Two times a day (BID) | OROMUCOSAL | Status: DC

## 2016-12-29 MED ORDER — NAPHAZOLINE-PHENIRAMINE 0.025-0.3 % OP SOLN
2.0000 [drp] | Freq: Four times a day (QID) | OPHTHALMIC | Status: DC | PRN
  Filled 2016-12-29: qty 5

## 2016-12-29 MED ORDER — FENTANYL CITRATE (PF) 100 MCG/2ML IJ SOLN: 25.0000 ug | INTRAMUSCULAR | Status: DC | PRN

## 2016-12-29 MED ORDER — LABETALOL HCL 5 MG/ML IV SOLN
INTRAVENOUS | Status: AC
  Filled 2016-12-29: qty 4

## 2016-12-29 MED ORDER — LACTATED RINGERS IV SOLN
INTRAVENOUS | Status: DC | PRN
  Administered 2016-12-29: 06:00:00 via INTRAVENOUS

## 2016-12-29 MED ORDER — INSULIN ASPART 100 UNIT/ML ~~LOC~~ SOLN: 2.0000 [IU] | SUBCUTANEOUS | Status: DC

## 2016-12-29 MED ORDER — IPRATROPIUM-ALBUTEROL 0.5-2.5 (3) MG/3ML IN SOLN
3.0000 mL | Freq: Four times a day (QID) | RESPIRATORY_TRACT | Status: DC | PRN
  Filled 2016-12-29: qty 3

## 2016-12-29 MED ORDER — ORAL CARE MOUTH RINSE
15.0000 mL | Freq: Two times a day (BID) | OROMUCOSAL | Status: DC
  Administered 2016-12-29: 15 mL via OROMUCOSAL

## 2016-12-29 MED ORDER — SALINE SPRAY 0.65 % NA SOLN
1.0000 | NASAL | Status: DC | PRN
  Administered 2016-12-29: 1 via NASAL
  Filled 2016-12-29: qty 44

## 2016-12-29 MED ORDER — LABETALOL HCL 5 MG/ML IV SOLN: 5.0000 mg | INTRAVENOUS | Status: DC | PRN

## 2016-12-29 MED ORDER — FENTANYL CITRATE (PF) 250 MCG/5ML IJ SOLN
INTRAMUSCULAR | Status: AC
  Filled 2016-12-29: qty 5

## 2016-12-29 SURGICAL SUPPLY — 39 items
BLADE CLIPPER SURG (BLADE) IMPLANT
BLADE SURG 15 STRL LF DISP TIS (BLADE) IMPLANT
BLADE SURG 15 STRL SS (BLADE)
CANISTER SUCT 3000ML PPV (MISCELLANEOUS) ×3 IMPLANT
CLEANER TIP ELECTROSURG 2X2 (MISCELLANEOUS) ×3 IMPLANT
CLOSURE WOUND 1/2 X4 (GAUZE/BANDAGES/DRESSINGS)
COVER SURGICAL LIGHT HANDLE (MISCELLANEOUS) ×3 IMPLANT
CRADLE DONUT ADULT HEAD (MISCELLANEOUS) IMPLANT
DRAPE HALF SHEET 40X57 (DRAPES) IMPLANT
ELECT COATED BLADE 2.86 ST (ELECTRODE) ×3 IMPLANT
ELECT REM PT RETURN 9FT ADLT (ELECTROSURGICAL) ×3
ELECTRODE REM PT RTRN 9FT ADLT (ELECTROSURGICAL) ×1 IMPLANT
GAUZE SPONGE 4X4 16PLY XRAY LF (GAUZE/BANDAGES/DRESSINGS) ×3 IMPLANT
GLOVE ECLIPSE 7.5 STRL STRAW (GLOVE) ×3 IMPLANT
GOWN STRL REUS W/ TWL LRG LVL3 (GOWN DISPOSABLE) ×2 IMPLANT
GOWN STRL REUS W/TWL LRG LVL3 (GOWN DISPOSABLE) ×4
KIT BASIN OR (CUSTOM PROCEDURE TRAY) ×3 IMPLANT
KIT ROOM TURNOVER OR (KITS) ×3 IMPLANT
NEEDLE HYPO 25GX1X1/2 BEV (NEEDLE) IMPLANT
NS IRRIG 1000ML POUR BTL (IV SOLUTION) ×3 IMPLANT
PAD ARMBOARD 7.5X6 YLW CONV (MISCELLANEOUS) ×6 IMPLANT
PENCIL FOOT CONTROL (ELECTRODE) ×3 IMPLANT
SPONGE DRAIN TRACH 4X4 STRL 2S (GAUZE/BANDAGES/DRESSINGS) IMPLANT
STRIP CLOSURE SKIN 1/2X4 (GAUZE/BANDAGES/DRESSINGS) IMPLANT
SURGILUBE 2OZ TUBE FLIPTOP (MISCELLANEOUS) IMPLANT
SUT CHROMIC GUT 2 0 PS 2 27 (SUTURE) IMPLANT
SUT ETHILON 3 0 PS 1 (SUTURE) IMPLANT
SUT SILK 2 0 SH CR/8 (SUTURE) ×3 IMPLANT
SUT SILK 3 0 SH 30 (SUTURE) IMPLANT
SUT SILK 3 0 TIES 17X18 (SUTURE)
SUT SILK 3-0 18XBRD TIE BLK (SUTURE) IMPLANT
SYR CONTROL 10ML LL (SYRINGE) ×3 IMPLANT
TOWEL OR 17X24 6PK STRL BLUE (TOWEL DISPOSABLE) IMPLANT
TRAY ENT MC OR (CUSTOM PROCEDURE TRAY) ×3 IMPLANT
TUBE CONNECTING 12'X1/4 (SUCTIONS) ×1
TUBE CONNECTING 12X1/4 (SUCTIONS) ×2 IMPLANT
TUBE TRACH 6 EXL  PROX UNCUF (TUBING) ×2
TUBE TRACH 6 EXL PROX UNCUF (TUBING) ×1 IMPLANT
WATER STERILE IRR 1000ML POUR (IV SOLUTION) ×3 IMPLANT

## 2016-12-29 NOTE — ED Notes (Signed)
ENT, CCM and ED MD at bedside

## 2016-12-29 NOTE — Anesthesia Postprocedure Evaluation (Signed)
Anesthesia Post Note  Patient: Nicholas Cobb  Procedure(s) Performed: TRACHEOSTOMY (N/A Neck)     Patient location during evaluation: PACU Anesthesia Type: MAC Level of consciousness: awake and alert Pain management: pain level controlled Vital Signs Assessment: post-procedure vital signs reviewed and stable Respiratory status: spontaneous breathing, nonlabored ventilation and respiratory function stable Cardiovascular status: stable and blood pressure returned to baseline Postop Assessment: no apparent nausea or vomiting Anesthetic complications: no    Last Vitals:  Vitals:   12/29/16 0730 12/29/16 0741  BP:  137/90  Pulse: 92 93  Resp: 17 18  Temp:  (!) 36.4 C  SpO2: 96% 97%    Last Pain:  Vitals:   12/29/16 0727  TempSrc:   PainSc: 0-No pain                 Beza Steppe,W. EDMOND

## 2016-12-29 NOTE — Anesthesia Preprocedure Evaluation (Signed)
Anesthesia Evaluation  Patient identified by MRN, date of birth, ID band Patient awake    Reviewed: Allergy & Precautions, H&P , NPO status , Patient's Chart, lab work & pertinent test results  Airway Mallampati: Trach   Neck ROM: Limited  Mouth opening: Limited Mouth Opening  Dental no notable dental hx. (+) Dental Advisory Given, Edentulous Upper, Edentulous Lower   Pulmonary COPD,  COPD inhaler, former smoker,  Tracheostomy   Pulmonary exam normal breath sounds clear to auscultation       Cardiovascular hypertension, Pt. on medications negative cardio ROS   Rhythm:Regular Rate:Tachycardia     Neuro/Psych Depression negative neurological ROS     GI/Hepatic Neg liver ROS, GERD  Medicated and Controlled,  Endo/Other  diabetes  Renal/GU negative Renal ROS  negative genitourinary   Musculoskeletal  (+) Arthritis , Osteoarthritis,    Abdominal   Peds  Hematology negative hematology ROS (+)   Anesthesia Other Findings   Reproductive/Obstetrics negative OB ROS                             Anesthesia Physical Anesthesia Plan  ASA: III and emergent  Anesthesia Plan: MAC   Post-op Pain Management:    Induction:   PONV Risk Score and Plan: 2 and Treatment may vary due to age or medical condition  Airway Management Planned: Tracheostomy  Additional Equipment:   Intra-op Plan:   Post-operative Plan:   Informed Consent: I have reviewed the patients History and Physical, chart, labs and discussed the procedure including the risks, benefits and alternatives for the proposed anesthesia with the patient or authorized representative who has indicated his/her understanding and acceptance.   Dental advisory given  Plan Discussed with: CRNA  Anesthesia Plan Comments:         Anesthesia Quick Evaluation

## 2016-12-29 NOTE — Progress Notes (Signed)
PCCM Progress Note  Subjective: Had trach revised.  Denies difficulty breathing.  Vital signs: BP 114/71   Pulse 92   Temp 98.1 F (36.7 C) (Oral)   Resp 18   SpO2 99%   Alert, trach site clean, Hr regular, no wheeze, abdomen soft, no edema, follows commands.  CMP Latest Ref Rng & Units 12/29/2016 11/27/2016 11/26/2016  Glucose 65 - 99 mg/dL 116(H) 103(H) 84  BUN 6 - 20 mg/dL 16 12 11   Creatinine 0.61 - 1.24 mg/dL 0.77 0.81 0.85  Sodium 135 - 145 mmol/L 129(L) 132(L) 133(L)  Potassium 3.5 - 5.1 mmol/L 4.1 4.0 3.6  Chloride 101 - 111 mmol/L 93(L) 96(L) 95(L)  CO2 22 - 32 mmol/L 25 27 30   Calcium 8.9 - 10.3 mg/dL 9.2 8.8(L) 8.7(L)  Total Protein 6.5 - 8.1 g/dL - 7.2 -  Total Bilirubin 0.3 - 1.2 mg/dL - 0.6 -  Alkaline Phos 38 - 126 U/L - 135(H) -  AST 15 - 41 U/L - 42(H) -  ALT 17 - 63 U/L - 32 -   CBC Latest Ref Rng & Units 12/29/2016 11/27/2016 11/26/2016  WBC 4.0 - 10.5 K/uL 4.5 5.8 5.6  Hemoglobin 13.0 - 17.0 g/dL 14.3 13.9 13.8  Hematocrit 39.0 - 52.0 % 42.6 42.0 41.5  Platelets 150 - 400 K/uL 238 263 256   Dg Chest Portable 1 View  Result Date: 12/29/2016 CLINICAL DATA:  Acute onset of shortness of breath. Patient removed tracheostomy tube. EXAM: PORTABLE CHEST 1 VIEW COMPARISON:  Chest radiograph performed 11/25/2016 FINDINGS: The lungs are well-aerated. Mild bibasilar opacities likely reflect atelectasis. There is no evidence of pleural effusion or pneumothorax. The cardiomediastinal silhouette is within normal limits. No acute osseous abnormalities are seen. IMPRESSION: Mild bibasilar opacities likely reflect atelectasis. Lungs otherwise grossly clear. Electronically Signed   By: Garald Balding M.D.   On: 12/29/2016 04:44    Assessment/plan:  Osteoradionecrosis of the jaw after tx for head and neck cancer with chronic tracheostomy with trach dislodgement. - replaced by ENT  Hx of COPD, GERD, HTN. - continue outpt medications  D/w ENT.  Okay for d/c  home.  Chesley Mires, MD [facility] Pulmonary/Critical Care 12/29/2016, 2:07 PM Pager:  [phone] After 3pm call: [phone]

## 2016-12-29 NOTE — Transfer of Care (Signed)
Immediate Anesthesia Transfer of Care Note  Patient: Nicholas Cobb  Procedure(s) Performed: TRACHEOSTOMY (N/A Neck)  Patient Location: PACU  Anesthesia Type:MAC  Level of Consciousness: awake, alert  and oriented  Airway & Oxygen Therapy: Patient Spontanous Breathing and Patient connected to tracheostomy mask oxygen  Post-op Assessment: Report given to RN and Post -op Vital signs reviewed and stable  Post vital signs: Reviewed and stable  Last Vitals:  Vitals:   12/29/16 0430 12/29/16 0500  BP: (!) 195/112 (!) 168/102  Pulse: 98 87  Resp: (!) 28 19  Temp:    SpO2: 93% 94%    Last Pain:  Vitals:   12/29/16 0545  TempSrc:   PainSc: 0-No pain         Complications: No apparent anesthesia complications

## 2016-12-29 NOTE — Progress Notes (Signed)
Patient stable. BP 163/88, HR 97, Sat >94 on RA.  Pt discharged with obturator for new trach.  Trach secured with foam tie.  Discharge instructions reviewed with him.  He had no questions. No apparent distress.  Patient was transported via wheelchair to ED entrance by NT for taxi transportation home.  He was given a voucher by social work for the taxi.

## 2016-12-29 NOTE — ED Provider Notes (Signed)
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  He denies fevers, cough.  No increased facial swelling or neck pain.  No increased secretions.  States that they are able to replace his trach in the trach clinic but do have significant difficulty  ROS: See HPI Constitutional: no fever  Eyes: no drainage  ENT: no runny nose   Cardiovascular:  no chest pain  Resp: no SOB  GI: no vomiting GU: no dysuria Integumentary: no rash  Allergy: no hives  Musculoskeletal: no leg swelling  Neurological: no slurred speech ROS otherwise negative  PAST MEDICAL HISTORY/PAST SURGICAL HISTORY:  Past Medical History:  Diagnosis Date  . Allergy   . Cancer ([place]) 1999   throat cancer  . Cervical spondylolysis 04/30/2011   severe  . Cervical spondylosis 03/25/2011  . Change in voice   . Chronic pain 03/25/2011  . Chronic sinusitis 04/27/2011  . Depression   . Difficulty urinating   . Emphysema 03/25/2011  . Essential hypertension, benign 12/03/2012  . GERD (gastroesophageal reflux disease) 03/25/2011  . Hearing loss   . Impaired glucose tolerance 04/27/2011  . 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 Rash   . Trouble swallowing   . Type II or unspecified type diabetes mellitus without mention of complication, uncontrolled 04/27/2011  . Ulcer   . UTI (lower urinary tract infection)   . Xerostomia 03/25/2011    MEDICATIONS:  Prior to Admission medications   Medication Sig Start Date End Date Taking? Authorizing Provider  albuterol (PROVENTIL HFA;VENTOLIN HFA) 108 (90 Base) MCG/ACT inhaler Inhale 2 puffs into the lungs every 4 (four) hours as  needed for wheezing or shortness of breath. 03/09/16   [name], NP  AMBULATORY NON FORMULARY MEDICATION Trach(Shiley 6.0 XLT UP uncuffed), Trach Care Kit, Trach holders, Disposable inner cannulas 07/08/15   Gildardo Cranker, DO  betamethasone dipropionate 0.05 % lotion Apply topically 2 (two) times daily. To rash on leg 10/20/15   Gildardo Cranker, DO  ibuprofen (ADVIL,MOTRIN) 400 MG tablet Take 400 mg per tube three times a day as needed for pain 11/27/16   Joette Catching T, MD  ipratropium-albuterol (DUONEB) 0.5-2.5 (3) MG/3ML SOLN Take 3 mLs by nebulization every 6 (six) hours as needed. Patient taking differently: Take 3 mLs by nebulization every 6 (six) hours as needed (for wheezing or shortness of breath).  08/01/16   Robyn Haber, MD  losartan (COZAAR) 100 MG tablet Place 1 tablet (100 mg total) into feeding tube daily. 11/27/16   Cherene Altes, MD  naphazoline-pheniramine (NAPHCON-A) 0.025-0.3 % ophthalmic solution Place 2 drops into both eyes 4 (four) times daily as needed for eye irritation or allergies.     [provider]  Nutritional Supplements (FEEDING SUPPLEMENT, JEVITY 1.2 CAL,) LIQD Place 474 mLs into feeding tube every 6 (six) hours. 11/27/16   Cherene Altes, MD  oxymetazoline (12 HOUR NASAL SPRAY) 0.05 % nasal spray Place 2 sprays into the nose 2 (two) times daily as needed for congestion.     [provider]  ranitidine (ZANTAC) 150 MG tablet Take 150 mg per tube two times a day 11/27/16  Cherene Altes, MD  triamcinolone cream (KENALOG) 0.1 % Apply 1 application topically 2 (two) times daily. APPLY TO AFFECTED AREAS 11/27/16   Cherene Altes, MD    ALLERGIES:  Allergies  Allergen Reactions  . Oxybutynin Chloride Nausea And Vomiting  . Codeine Nausea And Vomiting and Rash    SOCIAL HISTORY:  Social History   Tobacco Use  . Smoking status: Former Smoker    Packs/day: 1.00    Years: 23.00    Pack years: 23.00    Last  attempt to quit: 11/06/1996    Years since quitting: 20.1  . Smokeless tobacco: Never Used  Substance Use Topics  . Alcohol use: No    Alcohol/week: 0.0 oz    FAMILY HISTORY: Family History  Problem Relation Age of Onset  . Diabetes Mother   . Heart disease Father   . Stroke Other   . Diabetes Other   . Cancer Other        lung cancer    EXAM: BP (!) 190/133 (BP Location: Right Arm)   Pulse 97   Temp 98.8 F (37.1 C) (Temporal)   Resp (!) 26   SpO2 98%  CONSTITUTIONAL: Alert and oriented and responds appropriately to questions. Well-appearing; well-nourished HEAD: Normocephalic, significant facial lymphedema which is chronic worse on the right side EYES: Conjunctivae clear, pupils appear equal, EOMI ENT: normal nose; moist mucous membranes NECK: Supple, no meningismus, no nuchal rigidity, no LAD, extremely limited motion of the neck which is chronic, tracheostomy without significant secretions CARD: RRR; S1 and S2 appreciated; no murmurs, no clicks, no rubs, no gallops RESP: Normal chest excursion without splinting or tachypnea; breath sounds clear and equal bilaterally; no wheezes, no rhonchi, no rales, no hypoxia or respiratory distress, speaking full sentences ABD/GI: Normal bowel sounds; non-distended; soft, non-tender, no rebound, no guarding, no peritoneal signs, no hepatosplenomegaly BACK:  The back appears normal and is non-tender to palpation, there is no CVA tenderness EXT: Normal ROM in all joints; non-tender to palpation; no edema; normal capillary refill; no cyanosis, no calf tenderness or swelling    SKIN: Normal color for age and race; warm; no rash NEURO: Moves all extremities equally PSYCH: The patient's mood and manner are appropriate. Grooming and personal hygiene are appropriate.  MEDICAL DECISION MAKING: Patient here with shortness of breath and he removed his own trach.  We have discussed the option of trying to replace the trach emergently in the ER but  he is very apprehensive about this as he has had complications in the past and recently had to go to the operating room emergently.  At this time he is sitting upright without significant distress and sats are 97% on room air.  Will consult pulmonology.  He states that he is able to have his trach replaced in the trach clinic.  Currently has a 6 Shiley proximal XLT.  ED PROGRESS:   4:25 AM  D/w Dr. Vaughan Browner with pulmonology.  Appreciate his help.  He states someone will come to the emergency department to evaluate the patient and attempt to replace the trach but he would like ENT present.  4:40 AM  D/w Dr. Janace Hoard with ENT who will see patient in the ED. Appreciate his help.   CCM and ENT at bedside.  Patient had brief episode of respiratory distress with sats that dropped to 73% and became tachypneic and tachycardic.  Bloody secretions after suctioning.  Currently improving.  ENT has scoped patient at bedside and will take  him to the operating room.    I reviewed all nursing notes, vitals, pertinent previous records, EKGs, lab and urine results, imaging (as available).    Ward, Delice Bison, [place] 12/29/16 [phone]

## 2016-12-29 NOTE — ED Notes (Signed)
Escorted to OR by this RN

## 2016-12-29 NOTE — ED Triage Notes (Signed)
Pt brought in note saying that he pulled out his trach, note states he's been having breathing problems since trach was placed.

## 2016-12-29 NOTE — Progress Notes (Signed)
Called Dr. Ola Spurr about pt's BP=164/110. Orders where given to give 5mg  Labetalol until DBP<100. Rechecked BP=140/95, no Labetalol was given at this time. RN will continue to monitor.

## 2016-12-29 NOTE — Discharge Summary (Signed)
Physician Discharge Summary  Patient ID: Nicholas Cobb MRN: [id number] DOB/AGE: [birth year] 66 y.o.  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  He has a PMH of GERD, depression, DM2, cervical spondylosis and osteoradionecrosis of the jaw, head/neck cancer s/p tracheostomy.  The patient was seen by ENT and noted that his stoma was completely occluded.  He was taken to the OR urgently for serial dilation of tracheal stoma and replacement of #6 Shiley tracheostomy.  Post surgery, he was observed in the ICU overnight.  He had no complications.  The patient was cleared by ENT for discharge.  He was medically cleared for discharge 11/16 with plans as above.      TUBES / LINES #6 Shiley    Discharge Exam: General: alert adult male in NAD HEENT:  #6 Shiley uncuffed trach midline, lymphedema of face unchanged Neuro:AAox4, communicates via writing CV: s1s2 rrr, no m/r/g PULM: even / non-labored, lungs bilaterally without wheeze  GI: abdomen soft, bsx4 active  Extremities: warm/dry  Vitals:   12/29/16 1136 12/29/16 1200 12/29/16 1300 12/29/16 1400  BP: 124/86  114/71 (!) 152/98  Pulse: 92   88  Resp: (!) _0 (!) 26   Temp:      TempSrc:      SpO2: 99%   98%     Discharge Labs  BMET Recent Labs  Lab 12/29/16 0413  NA 129*  K 4.1  CL 93*  CO2 25  GLUCOSE 116*  BUN 16  CREATININE 0.77  CALCIUM 9.2    CBC Recent Labs  Lab 12/29/16 0413  HGB 14.3  HCT 42.6  WBC 4.5  PLT 238     Discharge Instructions    Call MD for:  difficulty breathing, headache or visual disturbances   Complete by:  As directed    Call MD for:  extreme fatigue   Complete by:  As directed    Call MD for:  hives   Complete by:  As directed    Call MD for:  persistant dizziness or light-headedness   Complete by:  As directed    Call MD for:  persistant nausea and vomiting   Complete by:  As directed    Call MD for:  redness, tenderness, or signs of infection (pain, swelling,  redness, odor or green/yellow discharge around incision site)   Complete by:  As directed    Call MD for:  severe uncontrolled pain   Complete by:  As directed    Call MD for:  temperature >100.4   Complete by:  As directed    Discharge instructions   Complete by:  As directed    1.  Review your medications carefully.  2.  Follow up with Dr. Janace Hoard & Marni Griffon, NP in the trach clinic 3.  Continue trach care as previously ordered  4.  Return to the ER if you have new or worsening symptoms   Increase activity slowly   Complete by:  As directed        Follow-up Information    [facility] Follow up on 01/31/2017.   Specialty:  Respiratory Therapy Why:  Appointment at 1:00 PM.  Please report to admitting for check in and arrive 15 minutes early.   Contact information: [address] [id number] [place] [address] [phone]           Allergies as of 12/29/2016      Reactions   Oxybutynin Chloride Nausea And Vomiting   Codeine Nausea And Vomiting, Rash      Medication List    TAKE these medications   12 HOUR NASAL SPRAY 0.05 % nasal spray Generic  drug:  oxymetazoline Place 2 sprays into the nose 2 (two) times daily as needed for congestion.   albuterol 108 (90 Base) MCG/ACT inhaler Commonly known as:  PROVENTIL HFA;VENTOLIN HFA Inhale 2 puffs into the lungs every 4 (four) hours as needed for wheezing or shortness of breath.   AMBULATORY NON FORMULARY MEDICATION Trach(Shiley 6.0 XLT UP uncuffed), Trach Care Kit, Trach holders, Disposable inner cannulas   betamethasone dipropionate 0.05 % lotion Apply topically 2 (two) times daily. To rash on leg   feeding supplement (JEVITY 1.2 CAL) Liqd Place 474 mLs into feeding tube every 6 (six) hours.   ibuprofen 400 MG tablet Commonly known as:  ADVIL,MOTRIN Take 400 mg per tube three times a day as needed for pain   ipratropium-albuterol 0.5-2.5 (3) MG/3ML Soln Commonly known as:  DUONEB Take 3 mLs by nebulization every 6 (six) hours as needed. What changed:  reasons to take this   losartan 100 MG tablet Commonly known as:  COZAAR Place 1 tablet (100 mg total) into feeding tube daily.   naphazoline-pheniramine 0.025-0.3 % ophthalmic solution Commonly known as:  NAPHCON-A Place 2 drops into both eyes 4 (four) times daily as needed for eye irritation or allergies.   ranitidine 150 MG tablet Commonly known as:  ZANTAC Take 150 mg per tube two times a day   triamcinolone cream 0.1 % Commonly known as:  KENALOG Apply 1 application topically 2 (two) times daily. APPLY TO AFFECTED AREAS        Disposition:  Home.  No new home health needs identified.  Resume all prior home health care.    Discharged Condition: Nicholas Cobb has met maximum benefit of inpatient care and is medically stable and cleared for discharge.  Patient is pending follow up as above.      Time spent on disposition:  30 Minutes.   Signed: Noe Gens, NP-C [facility] Pgr: [phone] Office: [phone]

## 2016-12-29 NOTE — Op Note (Signed)
Preop/postop diagnosis: Respiratory distress and trach stenosis  Procedure: Bronchoscopy with serial dilation of tracheal stoma and trachea and replacement of #6 uncuffed Shiley Anesthesia: None Estimated blood loss approximately 10 cc Indications: 66 year old with a history of head neck cancer and trach dependence.  He has significant lymphedema and completely trach dependent.  He was recently taken urgently to the operating room for a trach replacement.  This was on October 12.  He is now back in the emergency room have been removed his trach because he could not breathe.  He is struggling to breathe currently with stridor sound from crusting this in the stoma.  His neck is completely fused in a somewhat flexed position.  It is near impossible to see the stoma with direct visualization.  Using a flexible scope the stoma is completely occluded except for one small area of airway remaining.  He was in distress cannot communicate.  He was told that we need to go to the operating room to replace his trach and he was taken urgently to the operating room without a consent.  Procedure: Patient was taken to the operating room without any sedation or anesthesia was placed on the operating room table.  The stoma was examined with a flexible ENT scope.  The stone was completely covered with crusting.  A curved hemostat was used to extract the proximal crusting from the stoma.  This did improve the airway to some degree.  He still had struggles.  This manipulation did stir up some bleeding.  Suctioning he was then visualized with the scope and there was a narrow passageway into the trachea.  Using serial endotracheal tubes starting at 4.5 the balloon was used to dilate the stoma and trachea.  A #6 was placed and after this 1 the #6 uncuffed XL proximal length Shiley trach was placed.  The bleeding seemed to be well controlled.  The scoping of the trach down to the carina shows the trach tube tip to be sitting several  centimeters above the carina.  There was no bleeding.  Both bronchus look open and normal.  A trach tie was placed around the trach.  The patient was then taken to the recovery room in stable condition.

## 2016-12-29 NOTE — Consult Note (Signed)
Reason for Consult:airway obstruction Referring Physician: er  Nicholas Cobb is an 66 y.o. male.  HPI: hx of Head neck cancer and long term trach. He has had the trach out several times recently and he presents again with trach out. He is having diffculty breathing.  Past Medical History:  Diagnosis Date  . Allergy   . Cancer ([place]) 1999   throat cancer  . Cervical spondylolysis 04/30/2011   severe  . Cervical spondylosis 03/25/2011  . Change in voice   . Chronic pain 03/25/2011  . Chronic sinusitis 04/27/2011  . Depression   . Difficulty urinating   . Emphysema 03/25/2011  . Essential hypertension, benign 12/03/2012  . GERD (gastroesophageal reflux disease) 03/25/2011  . Hearing loss   . Impaired glucose tolerance 04/27/2011  . 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 Rash   . Trouble swallowing   . Type II or unspecified type diabetes mellitus without mention of complication, uncontrolled 04/27/2011  . Ulcer   . UTI (lower urinary tract infection)   . Xerostomia 03/25/2011    Past Surgical History:  Procedure Laterality Date  . GASTROSTOMY TUBE PLACEMENT    . IR GENERIC HISTORICAL  04/03/2016   IR CM INJ ANY COLONIC TUBE W/FLUORO 04/03/2016 [name], [name]  . PEG TUBE PLACEMENT  11/1999  . TRACHEOSTOMY  11/1999  . TRACHEOSTOMY TUBE PLACEMENT  11/24/2016   Procedure: EMERGENT TRACHEOSTOMY CHANGE;  Surgeon: Jerrell Belfast, MD;  Location: [facility];  Service: ENT;;    Family History  Problem Relation Age of Onset  . Diabetes Mother   . Heart disease Father   . Stroke Other   . Diabetes Other   . Cancer Other        lung cancer    Social History:  reports that he quit smoking about 20 years ago. He has a 23.00 pack-year smoking history. he has never used smokeless tobacco. He reports that he does not drink alcohol or use drugs.  Allergies:  Allergies  Allergen Reactions  . Oxybutynin Chloride Nausea And Vomiting  . Codeine Nausea And Vomiting and Rash     Medications: I have reviewed the patient's current medications.  Results for orders placed or performed during the hospital encounter of 12/29/16 (from the past 48 hour(s))  CBC with Differential     Status: Abnormal   Collection Time: 12/29/16  4:13 AM  Result Value Ref Range   WBC 4.5 4.0 - 10.5 K/uL   RBC 4.77 4.22 - 5.81 MIL/uL   Hemoglobin 14.3 13.0 - 17.0 g/dL   HCT 42.6 39.0 - 52.0 %   MCV 89.3 78.0 - 100.0 fL   MCH 30.0 26.0 - 34.0 pg   MCHC 33.6 30.0 - 36.0 g/dL   RDW 13.3 11.5 - 15.5 %   Platelets 238 150 - 400 K/uL   Neutrophils Relative % 65 %   Neutro Abs 3.0 1.7 - 7.7 K/uL   Lymphocytes Relative 13 %   Lymphs Abs 0.6 (L) 0.7 - 4.0 K/uL   Monocytes Relative 19 %   Monocytes Absolute 0.8 0.1 - 1.0 K/uL   Eosinophils Relative 3 %   Eosinophils Absolute 0.1 0.0 - 0.7 K/uL   Basophils Relative 0 %   Basophils Absolute 0.0 0.0 - 0.1 K/uL  Basic metabolic panel     Status: Abnormal   Collection Time: 12/29/16  4:13 AM  Result Value Ref Range   Sodium 129 (L) 135 - 145 mmol/L   Potassium 4.1  3.5 - 5.1 mmol/L   Chloride 93 (L) 101 - 111 mmol/L   CO2 25 22 - 32 mmol/L   Glucose, Bld 116 (H) 65 - 99 mg/dL   BUN 16 6 - 20 mg/dL   Creatinine, Ser 0.77 0.61 - 1.24 mg/dL   Calcium 9.2 8.9 - 10.3 mg/dL   GFR calc non Af Amer >60 >60 mL/min   GFR calc Af Amer >60 >60 mL/min    Comment: (NOTE) The eGFR has been calculated using the CKD EPI equation. This calculation has not been validated in all clinical situations. eGFR's persistently <60 mL/min signify possible Chronic Kidney Disease.    Anion gap 11 5 - 15    Dg Chest Portable 1 View  Result Date: 12/29/2016 CLINICAL DATA:  Acute onset of shortness of breath. Patient removed tracheostomy tube. EXAM: PORTABLE CHEST 1 VIEW COMPARISON:  Chest radiograph performed 11/25/2016 FINDINGS: The lungs are well-aerated. Mild bibasilar opacities likely reflect atelectasis. There is no evidence of pleural effusion or  pneumothorax. The cardiomediastinal silhouette is within normal limits. No acute osseous abnormalities are seen. IMPRESSION: Mild bibasilar opacities likely reflect atelectasis. Lungs otherwise grossly clear. Electronically Signed   By: Garald Balding M.D.   On: 12/29/2016 04:44    ROS Blood pressure (!) 168/102, pulse 87, temperature 98.8 F (37.1 C), temperature source Temporal, resp. rate 19, SpO2 94 %. Physical Exam  HENT:  He is having distressed breathing and trach is out. He had completely stiff neck and cannot extend neck to see trach site. The FOE shows a small opening with extensive crusting in the lumen. He is breathing through a very small opening. His face is massively swollen from lymph edema.    Assessment/Plan: Respiratory distress-the patient's trach has dislodged by the patient and now he is in acute respiratory distress.  He has minimal to no airway remaining in a very difficult airway.  He is nonverbal and needed urgent maneuvering to the operating room so no consent was obtained written but he was described and discussed the surgery as to what was needed and what was going to happen.  He was taken directly from the emergency room to the operating room.  Melissa Montane 12/29/2016, 5:34 AM

## 2016-12-29 NOTE — H&P (Signed)
PULMONARY / CRITICAL CARE MEDICINE   Name: Nicholas Cobb MRN: [id number] DOB: [birth year]    ADMISSION DATE:  12/29/2016 CONSULTATION DATE:  12/29/2016  REFERRING MD:  Dr. Leonides Schanz   CHIEF COMPLAINT:  Trach Dislodgement   HISTORY OF PRESENT ILLNESS:   66 year old male with PMH of GERD, Depression, Cervical spondylolysis, Osteoradionecrosis of jaw, DM 2, Head and Neck Cancer s/p tracheostomy, significant lymphedema, on 10/12 taken to OR for trach replacement  Patient arrived to ED 11/16 after trach dislodgment (patient states he removed trach because he felt short of breath), upon arrival patient is struggling to breath with stridor, crusting noted around stoma. ENT consulted. Patient take to OR for replacement. PCCM asked to observe patient in ICU post-operatively.     PAST MEDICAL HISTORY :  He  has a past medical history of Allergy, Cancer ([place]) (1999), Cervical spondylolysis (04/30/2011), Cervical spondylosis (03/25/2011), Change in voice, Chronic pain (03/25/2011), Chronic sinusitis (04/27/2011), Depression, Difficulty urinating, Emphysema (03/25/2011), Essential hypertension, benign (12/03/2012), GERD (gastroesophageal reflux disease) (03/25/2011), Hearing loss, Impaired glucose tolerance (04/27/2011), Osteoradionecrosis of jaw (03/25/2011), Rash, Trouble swallowing, Type II or unspecified type diabetes mellitus without mention of complication, uncontrolled (04/27/2011), Ulcer, UTI (lower urinary tract infection), and Xerostomia (03/25/2011).  PAST SURGICAL HISTORY: He  has a past surgical history that includes PEG tube placement (11/1999); Tracheostomy (11/1999); Gastrostomy tube placement; ir generic historical (04/03/2016); and Tracheostomy tube placement (11/24/2016).  Allergies  Allergen Reactions  . Oxybutynin Chloride Nausea And Vomiting  . Codeine Nausea And Vomiting and Rash    No current facility-administered medications on file prior to encounter.    Current Outpatient Medications on  File Prior to Encounter  Medication Sig  . albuterol (PROVENTIL HFA;VENTOLIN HFA) 108 (90 Base) MCG/ACT inhaler Inhale 2 puffs into the lungs every 4 (four) hours as needed for wheezing or shortness of breath.  . AMBULATORY NON FORMULARY MEDICATION Trach(Shiley 6.0 XLT UP uncuffed), [place], Trach holders, Disposable inner cannulas  . betamethasone dipropionate 0.05 % lotion Apply topically 2 (two) times daily. To rash on leg  . ibuprofen (ADVIL,MOTRIN) 400 MG tablet Take 400 mg per tube three times a day as needed for pain  . ipratropium-albuterol (DUONEB) 0.5-2.5 (3) MG/3ML SOLN Take 3 mLs by nebulization every 6 (six) hours as needed. (Patient taking differently: Take 3 mLs by nebulization every 6 (six) hours as needed (for wheezing or shortness of breath). )  . losartan (COZAAR) 100 MG tablet Place 1 tablet (100 mg total) into feeding tube daily.  . naphazoline-pheniramine (NAPHCON-A) 0.025-0.3 % ophthalmic solution Place 2 drops into both eyes 4 (four) times daily as needed for eye irritation or allergies.   . Nutritional Supplements (FEEDING SUPPLEMENT, JEVITY 1.2 CAL,) LIQD Place 474 mLs into feeding tube every 6 (six) hours.  Marland Kitchen oxymetazoline (12 HOUR NASAL SPRAY) 0.05 % nasal spray Place 2 sprays into the nose 2 (two) times daily as needed for congestion.   . ranitidine (ZANTAC) 150 MG tablet Take 150 mg per tube two times a day  . triamcinolone cream (KENALOG) 0.1 % Apply 1 application topically 2 (two) times daily. APPLY TO AFFECTED AREAS    FAMILY HISTORY:  His indicated that his mother is deceased. He indicated that his father is deceased. He indicated that both of his sisters are deceased.   SOCIAL HISTORY: He  reports that he quit smoking about 20 years ago. He has a 23.00 pack-year smoking history. he has never used smokeless tobacco. He reports that  he does not drink alcohol or use drugs.  REVIEW OF SYSTEMS:   All negative; except for those that are bolded, which  indicate positives.  Constitutional: weight loss, weight gain, night sweats, fevers, chills, fatigue, weakness.  HEENT: headaches, sore throat, sneezing, nasal congestion, post nasal drip, difficulty swallowing, tooth/dental problems, visual complaints, visual changes, ear aches. Neuro: difficulty with speech, weakness, numbness, ataxia. CV:  chest pain, orthopnea, PND, swelling in lower extremities, dizziness, palpitations, syncope.  Resp: cough, hemoptysis, dyspnea, wheezing. GI: heartburn, indigestion, abdominal pain, nausea, vomiting, diarrhea, constipation, change in bowel habits, loss of appetite, hematemesis, melena, hematochezia.  GU: dysuria, change in color of urine, urgency or frequency, flank pain, hematuria. MSK: joint pain or swelling, decreased range of motion. Psych: change in mood or affect, depression, anxiety, suicidal ideations, homicidal ideations. Skin: rash, itching, bruising.    SUBJECTIVE:   VITAL SIGNS: BP (!) 168/102   Pulse 87   Temp 98.8 F (37.1 C) (Temporal)   Resp 19   SpO2 94%   HEMODYNAMICS:    VENTILATOR SETTINGS:    INTAKE / OUTPUT: No intake/output data recorded.  PHYSICAL EXAMINATION: General:  Adult male, moderate respiratory distress  Neuro:  Alert, oriented, follows commands  HEENT:  Stoma noted to be crusted, hard to mobilize neck  Cardiovascular:  Tachy, no MRG  Lungs:  Clear breath sounds, labored   Abdomen:  Active bowels sounds, non-distended  Musculoskeletal:  -edema  Skin:  Dry, intact  LABS:  BMET Recent Labs  Lab 12/29/16 0413  NA 129*  K 4.1  CL 93*  CO2 25  BUN 16  CREATININE 0.77  GLUCOSE 116*    Electrolytes Recent Labs  Lab 12/29/16 0413  CALCIUM 9.2    CBC Recent Labs  Lab 12/29/16 0413  WBC 4.5  HGB 14.3  HCT 42.6  PLT 238    Coag's No results for input(s): APTT, INR in the last 168 hours.  Sepsis Markers No results for input(s): LATICACIDVEN, PROCALCITON, O2SATVEN in the last 168  hours.  ABG No results for input(s): PHART, PCO2ART, PO2ART in the last 168 hours.  Liver Enzymes No results for input(s): AST, ALT, ALKPHOS, BILITOT, ALBUMIN in the last 168 hours.  Cardiac Enzymes No results for input(s): TROPONINI, PROBNP in the last 168 hours.  Glucose Recent Labs  Lab 12/29/16 0628  GLUCAP 134*    Imaging Dg Chest Portable 1 View  Result Date: 12/29/2016 CLINICAL DATA:  Acute onset of shortness of breath. Patient removed tracheostomy tube. EXAM: PORTABLE CHEST 1 VIEW COMPARISON:  Chest radiograph performed 11/25/2016 FINDINGS: The lungs are well-aerated. Mild bibasilar opacities likely reflect atelectasis. There is no evidence of pleural effusion or pneumothorax. The cardiomediastinal silhouette is within normal limits. No acute osseous abnormalities are seen. IMPRESSION: Mild bibasilar opacities likely reflect atelectasis. Lungs otherwise grossly clear. Electronically Signed   By: Garald Balding M.D.   On: 12/29/2016 04:44     STUDIES:  CXR 11/16 > Mild bibasilar opacities likely reflect atelectasis. Lungs otherwise grossly clear  CULTURES: None  ANTIBIOTICS: None  SIGNIFICANT EVENTS: 11/16 > Presents to ED   LINES/TUBES: Trach 11/16   DISCUSSION: 66 year old male presents after trach dislodgment. Take to OR for replacement.   ASSESSMENT / PLAN:  Tracheostomy Dependent s/p Head/Neck CA  Difficult Airway  Plan  -Take to OR for Replacement > 6 XLT Cuffless  -Observe in ICU  -CXR  COPD  Plan  -PRN Duoneb   GERD Plan  -PPI  DM2 Plan  -Trend glucose  -SSI   FAMILY  - Updates: No family at bedside. Patient updated on plan of care   - Inter-disciplinary family meet or Palliative Care meeting due by:  01/05/2017   CC Time: 60 minutes   Hayden Pedro, AGACNP-BC [place]  Pgr: [phone]  PCCM Pgr: [phone]

## 2016-12-30 ENCOUNTER — Encounter (HOSPITAL_COMMUNITY): Payer: Self-pay | Admitting: Otolaryngology

## 2017-01-01 ENCOUNTER — Telehealth: Payer: Self-pay

## 2017-01-02 NOTE — Telephone Encounter (Signed)
I have made the 2nd attempt to contact the patient or family member in charge, in order to follow up from recently being discharged from the hospital. I left a message on voicemail requesting a CB to my direct line.

## 2017-01-03 NOTE — Telephone Encounter (Signed)
I have tried to contact the pt on 3 different occasions and have left two voicemail's. There has been no return calls. Pt does not have a hospital follow up apt scheduled. FYI! -MM

## 2017-01-08 DIAGNOSIS — E871 Hypo-osmolality and hyponatremia: Secondary | ICD-10-CM | POA: Diagnosis not present

## 2017-01-08 DIAGNOSIS — M8708 Idiopathic aseptic necrosis of bone, other site: Secondary | ICD-10-CM | POA: Diagnosis not present

## 2017-01-08 DIAGNOSIS — I10 Essential (primary) hypertension: Secondary | ICD-10-CM | POA: Diagnosis not present

## 2017-01-09 DIAGNOSIS — C154 Malignant neoplasm of middle third of esophagus: Secondary | ICD-10-CM | POA: Diagnosis not present

## 2017-01-09 DIAGNOSIS — Z930 Tracheostomy status: Secondary | ICD-10-CM | POA: Diagnosis not present

## 2017-01-09 DIAGNOSIS — H9319 Tinnitus, unspecified ear: Secondary | ICD-10-CM | POA: Diagnosis not present

## 2017-01-09 DIAGNOSIS — R609 Edema, unspecified: Secondary | ICD-10-CM | POA: Diagnosis not present

## 2017-01-09 DIAGNOSIS — M8660 Other chronic osteomyelitis, unspecified site: Secondary | ICD-10-CM | POA: Diagnosis not present

## 2017-01-09 DIAGNOSIS — C153 Malignant neoplasm of upper third of esophagus: Secondary | ICD-10-CM | POA: Diagnosis not present

## 2017-01-11 DIAGNOSIS — J4531 Mild persistent asthma with (acute) exacerbation: Secondary | ICD-10-CM | POA: Diagnosis not present

## 2017-01-17 ENCOUNTER — Other Ambulatory Visit: Payer: Self-pay | Admitting: Internal Medicine

## 2017-01-17 NOTE — Telephone Encounter (Signed)
Yes this is okay to refill

## 2017-01-17 NOTE — Telephone Encounter (Signed)
A refill request was received electronically. After verifying with provider, medication was refilled electronically.

## 2017-01-20 DIAGNOSIS — J4531 Mild persistent asthma with (acute) exacerbation: Secondary | ICD-10-CM | POA: Diagnosis not present

## 2017-01-28 DIAGNOSIS — C153 Malignant neoplasm of upper third of esophagus: Secondary | ICD-10-CM | POA: Diagnosis not present

## 2017-01-31 ENCOUNTER — Ambulatory Visit (HOSPITAL_COMMUNITY)
Admission: RE | Admit: 2017-01-31 | Discharge: 2017-01-31 | Disposition: A | Discharge status: Home / Self Care | Payer: Medicare Other | Source: Ambulatory Visit | Attending: Acute Care | Admitting: Acute Care

## 2017-01-31 ENCOUNTER — Telehealth: Payer: Self-pay | Admitting: *Deleted

## 2017-01-31 DIAGNOSIS — M879 Osteonecrosis, unspecified: Secondary | ICD-10-CM | POA: Diagnosis not present

## 2017-01-31 DIAGNOSIS — Z930 Tracheostomy status: Secondary | ICD-10-CM

## 2017-01-31 DIAGNOSIS — Z430 Encounter for attention to tracheostomy: Secondary | ICD-10-CM | POA: Diagnosis not present

## 2017-01-31 NOTE — Progress Notes (Signed)
Reason for visit: Routine tracheostomy change in care  Brief history This is a 66 year old patient well-known to me with a history of very severe radio-osteonecrosis resulting in marked mandibular deformity and chronic lymphedema.  He has been tracheostomy dependent for some time because of this.  [place] still has intermittent visits to the emergency room because of his tracheostomy getting dislodged.  Last time he required admission.  He returns today for routine tracheostomy care  Review of systems Negative, patient nonverbal, right's only notes.  Physical exam Heart rate 83 respirations 22 saturations 97% on room air blood pressure 158/97  General: Pleasant 66 year old male, communicates by writing on a notebook. HEENT: He has marked mandibular deformity with chronic lymphedema involving his face and mandibular area.  His right eye is almost swollen shut following this this is baseline.  He is nonverbal because of his deformity.  His #6 proximal XLT trach is in place. Pulmonary: Scattered rhonchi no accessory use Cardiac: Regular rate and rhythm Abdomen: Soft nontender PEG unremarkable Extremities equal strength and bulk Neuro: Awake oriented no focal deficits  Procedure Tracheostomy change utilizing bronchoscopy.  The trach was removed without difficulty, following this initially a new proximal XLT cuffless size 6 trach was placed over top of a bronchoscope.  He tolerated this well however the distal tip of the tracheostomy was partially occluded by the posterior wall of the trachea.  On further evaluation I suspect that the proximal XL T trach may actually be too long for him.  His neck is actually quite short, however advancing the tracheostomy passes mandibular D formation is the challenge.  Because of this we opted to trial the size 6 cuffless Bivona trach.  This is made of a soft silicone one-piece cannula, it should be much less traumatic, and airway evaluation via bronchoscopy after  placement demonstrated a much clearer view of the airway following placement.  Impression/plan Tracheostomy dependent in the setting of severe radial osteo-necrosis  Plan We will see him in 5 weeks for routine tracheostomy change Planning on changing to the disposable cuffed version of the size 6 Bivona that way his secretions should be better managed Extensive education has been given at bedside in regards to trach care, as well as what to do in the setting of an emergency My hope is changing this tracheostomy should result in much easier trach change should it become emergently dislodged as well as much less traumatic tracheostomy changes in general  45 minutes of time dedicated to care   [name] ACNP-BC [place] Pager # [phone] OR # [phone] if no answer

## 2017-01-31 NOTE — Telephone Encounter (Signed)
-----   Message from [name], NP sent at 01/31/2017  1:58 PM EST ----- Regarding: DME needs Hey guys!  Hope all is well!  Can you guys order this patient a new suction machine. He tells me that is is no longer working.  His DME is [facility]  Thanks!  Laurey Arrow

## 2017-01-31 NOTE — Progress Notes (Signed)
Tracheostomy Procedure Note  LABRIAN TORREGROSSA [id number] 05/01/1950  Pre Procedure Tracheostomy Information  Trach Brand: Shiley Size: 6.0 Style: Proximal and Uncuffed Secured by: Velcro   Procedure: trach change    Post Procedure Tracheostomy Information  Trach Brand: bivona Size: 6.0 Style: Uncuffed Secured by: Velcro   Post Procedure Evaluation:  ETCO2 positive color change from yellow to purple : Yes.   Vital signs:blood pressure 158/97, pulse 83, respirations 22 and pulse oximetry 97 % Patients current condition: stable Complications: No apparent complications Trach site exam: clean, dry Wound care done: 4 x 4 gauze Patient did tolerate procedure well.   Education: Educated on the difference between the old and the new trach.  No need to change the inner cannula.  Prescription needs: New trach of the same size and smaller size    Additional needs: Changed trach using the bronchoscope to aid with change.  Patient tolerated well.

## 2017-01-31 NOTE — Telephone Encounter (Signed)
Order has been placed per Pete. 

## 2017-02-02 DIAGNOSIS — J4531 Mild persistent asthma with (acute) exacerbation: Secondary | ICD-10-CM | POA: Diagnosis not present

## 2017-02-03 DIAGNOSIS — C154 Malignant neoplasm of middle third of esophagus: Secondary | ICD-10-CM | POA: Diagnosis not present

## 2017-02-03 DIAGNOSIS — R609 Edema, unspecified: Secondary | ICD-10-CM | POA: Diagnosis not present

## 2017-02-03 DIAGNOSIS — Z930 Tracheostomy status: Secondary | ICD-10-CM | POA: Diagnosis not present

## 2017-02-03 DIAGNOSIS — H9319 Tinnitus, unspecified ear: Secondary | ICD-10-CM | POA: Diagnosis not present

## 2017-02-03 DIAGNOSIS — C153 Malignant neoplasm of upper third of esophagus: Secondary | ICD-10-CM | POA: Diagnosis not present

## 2017-02-03 DIAGNOSIS — M8660 Other chronic osteomyelitis, unspecified site: Secondary | ICD-10-CM | POA: Diagnosis not present

## 2017-02-13 DIAGNOSIS — C153 Malignant neoplasm of upper third of esophagus: Secondary | ICD-10-CM | POA: Diagnosis not present

## 2017-02-20 DIAGNOSIS — J4531 Mild persistent asthma with (acute) exacerbation: Secondary | ICD-10-CM | POA: Diagnosis not present

## 2017-02-23 DIAGNOSIS — J4531 Mild persistent asthma with (acute) exacerbation: Secondary | ICD-10-CM | POA: Diagnosis not present

## 2017-03-01 ENCOUNTER — Observation Stay (HOSPITAL_COMMUNITY)
Admission: EM | Admit: 2017-03-01 | Discharge: 2017-03-03 | Disposition: A | Discharge status: Home / Self Care | Payer: Medicare Other | Attending: Internal Medicine | Admitting: Internal Medicine

## 2017-03-01 ENCOUNTER — Other Ambulatory Visit: Payer: Self-pay

## 2017-03-01 ENCOUNTER — Encounter (HOSPITAL_COMMUNITY): Payer: Self-pay | Admitting: Emergency Medicine

## 2017-03-01 ENCOUNTER — Emergency Department (HOSPITAL_COMMUNITY)
Admission: EM | Admit: 2017-03-01 | Discharge: 2017-03-01 | Disposition: A | Discharge status: Other Acute Inpatient Hospital | Payer: Medicare Other

## 2017-03-01 ENCOUNTER — Emergency Department (HOSPITAL_COMMUNITY): Payer: Medicare Other

## 2017-03-01 DIAGNOSIS — I10 Essential (primary) hypertension: Secondary | ICD-10-CM

## 2017-03-01 DIAGNOSIS — Y833 Surgical operation with formation of external stoma as the cause of abnormal reaction of the patient, or of later complication, without mention of misadventure at the time of the procedure: Secondary | ICD-10-CM | POA: Diagnosis not present

## 2017-03-01 DIAGNOSIS — J9509 Other tracheostomy complication: Secondary | ICD-10-CM | POA: Diagnosis not present

## 2017-03-01 DIAGNOSIS — E119 Type 2 diabetes mellitus without complications: Secondary | ICD-10-CM | POA: Diagnosis not present

## 2017-03-01 DIAGNOSIS — J9610 Chronic respiratory failure, unspecified whether with hypoxia or hypercapnia: Secondary | ICD-10-CM | POA: Insufficient documentation

## 2017-03-01 DIAGNOSIS — J9690 Respiratory failure, unspecified, unspecified whether with hypoxia or hypercapnia: Secondary | ICD-10-CM | POA: Diagnosis present

## 2017-03-01 DIAGNOSIS — Z8589 Personal history of malignant neoplasm of other organs and systems: Secondary | ICD-10-CM | POA: Insufficient documentation

## 2017-03-01 DIAGNOSIS — J439 Emphysema, unspecified: Secondary | ICD-10-CM

## 2017-03-01 DIAGNOSIS — L598 Other specified disorders of the skin and subcutaneous tissue related to radiation: Secondary | ICD-10-CM | POA: Diagnosis not present

## 2017-03-01 DIAGNOSIS — Z4682 Encounter for fitting and adjustment of non-vascular catheter: Secondary | ICD-10-CM | POA: Diagnosis not present

## 2017-03-01 DIAGNOSIS — Z885 Allergy status to narcotic agent status: Secondary | ICD-10-CM | POA: Insufficient documentation

## 2017-03-01 DIAGNOSIS — Z87891 Personal history of nicotine dependence: Secondary | ICD-10-CM | POA: Insufficient documentation

## 2017-03-01 DIAGNOSIS — Z8249 Family history of ischemic heart disease and other diseases of the circulatory system: Secondary | ICD-10-CM | POA: Diagnosis not present

## 2017-03-01 DIAGNOSIS — R000 Tachycardia, unspecified: Secondary | ICD-10-CM | POA: Insufficient documentation

## 2017-03-01 DIAGNOSIS — Z9889 Other specified postprocedural states: Secondary | ICD-10-CM | POA: Insufficient documentation

## 2017-03-01 DIAGNOSIS — Z79899 Other long term (current) drug therapy: Secondary | ICD-10-CM | POA: Insufficient documentation

## 2017-03-01 DIAGNOSIS — Z430 Encounter for attention to tracheostomy: Secondary | ICD-10-CM

## 2017-03-01 DIAGNOSIS — J9621 Acute and chronic respiratory failure with hypoxia: Secondary | ICD-10-CM | POA: Diagnosis present

## 2017-03-01 DIAGNOSIS — K219 Gastro-esophageal reflux disease without esophagitis: Secondary | ICD-10-CM | POA: Diagnosis not present

## 2017-03-01 DIAGNOSIS — J9501 Hemorrhage from tracheostomy stoma: Principal | ICD-10-CM | POA: Insufficient documentation

## 2017-03-01 DIAGNOSIS — J9503 Malfunction of tracheostomy stoma: Secondary | ICD-10-CM

## 2017-03-01 DIAGNOSIS — F329 Major depressive disorder, single episode, unspecified: Secondary | ICD-10-CM | POA: Insufficient documentation

## 2017-03-01 DIAGNOSIS — Z8529 Personal history of malignant neoplasm of other respiratory and intrathoracic organs: Secondary | ICD-10-CM | POA: Diagnosis not present

## 2017-03-01 DIAGNOSIS — Z923 Personal history of irradiation: Secondary | ICD-10-CM | POA: Insufficient documentation

## 2017-03-01 LAB — CBG MONITORING, ED: Glucose-Capillary: 102 mg/dL — ABNORMAL HIGH (ref 65–99)

## 2017-03-01 LAB — COMPREHENSIVE METABOLIC PANEL
ALBUMIN: 4 g/dL (ref 3.5–5.0)
ALT: 31 U/L (ref 17–63)
AST: 52 U/L — AB (ref 15–41)
Alkaline Phosphatase: 132 U/L — ABNORMAL HIGH (ref 38–126)
Anion gap: 14 (ref 5–15)
BUN: 18 mg/dL (ref 6–20)
CHLORIDE: 94 mmol/L — AB (ref 101–111)
CO2: 23 mmol/L (ref 22–32)
Calcium: 9.3 mg/dL (ref 8.9–10.3)
Creatinine, Ser: 1 mg/dL (ref 0.61–1.24)
GFR calc Af Amer: >60 mL/min (ref >60)
GFR calc non Af Amer: >60 mL/min (ref >60)
Glucose, Bld: 175 mg/dL — ABNORMAL HIGH (ref 65–99)
POTASSIUM: 4.3 mmol/L (ref 3.5–5.1)
SODIUM: 131 mmol/L — AB (ref 135–145)
Total Bilirubin: 0.5 mg/dL (ref 0.3–1.2)
Total Protein: 8.2 g/dL — ABNORMAL HIGH (ref 6.5–8.1)

## 2017-03-01 LAB — CBC WITH DIFFERENTIAL/PLATELET
BASOS PCT: 0 %
Basophils Absolute: 0 10*3/uL (ref 0.0–0.1)
EOS PCT: 2 %
Eosinophils Absolute: 0.2 10*3/uL (ref 0.0–0.7)
HEMATOCRIT: 44.1 % (ref 39.0–52.0)
Hemoglobin: 14.8 g/dL (ref 13.0–17.0)
LYMPHS ABS: 2.7 10*3/uL (ref 0.7–4.0)
Lymphocytes Relative: 35 %
MCH: 30.5 pg (ref 26.0–34.0)
MCHC: 33.6 g/dL (ref 30.0–36.0)
MCV: 90.9 fL (ref 78.0–100.0)
MONO ABS: 1.4 10*3/uL — AB (ref 0.1–1.0)
MONOS PCT: 18 %
Neutro Abs: 3.3 10*3/uL (ref 1.7–7.7)
Neutrophils Relative %: 45 %
PLATELETS: 273 10*3/uL (ref 150–400)
RBC: 4.85 MIL/uL (ref 4.22–5.81)
RDW: 13.4 % (ref 11.5–15.5)
WBC: 7.6 10*3/uL (ref 4.0–10.5)

## 2017-03-01 LAB — PROTIME-INR
INR: 1.04
Prothrombin Time: 13.5 seconds (ref 11.4–15.2)

## 2017-03-01 MED ORDER — INSULIN ASPART 100 UNIT/ML ~~LOC~~ SOLN
0.0000 [IU] | SUBCUTANEOUS | Status: DC
  Administered 2017-03-02: 2 [IU] via SUBCUTANEOUS
  Administered 2017-03-02 – 2017-03-03 (×3): 1 [IU] via SUBCUTANEOUS
  Administered 2017-03-03: 2 [IU] via SUBCUTANEOUS

## 2017-03-01 MED ORDER — IBUPROFEN 200 MG PO TABS
400.0000 mg | ORAL_TABLET | Freq: Four times a day (QID) | ORAL | Status: DC | PRN
  Administered 2017-03-02: 400 mg via ORAL
  Filled 2017-03-01: qty 2

## 2017-03-01 MED ORDER — FAMOTIDINE 20 MG PO TABS
20.0000 mg | ORAL_TABLET | Freq: Two times a day (BID) | ORAL | Status: DC
  Administered 2017-03-02 – 2017-03-03 (×3): 20 mg via ORAL
  Filled 2017-03-01 (×3): qty 1

## 2017-03-01 MED ORDER — TRIAMCINOLONE ACETONIDE 0.1 % EX CREA
1.0000 "application " | TOPICAL_CREAM | Freq: Two times a day (BID) | CUTANEOUS | Status: DC
  Filled 2017-03-01 (×2): qty 15

## 2017-03-01 MED ORDER — ACETAMINOPHEN 650 MG RE SUPP: 650.0000 mg | Freq: Four times a day (QID) | RECTAL | Status: DC | PRN

## 2017-03-01 MED ORDER — ACETAMINOPHEN 325 MG PO TABS: 650.0000 mg | ORAL_TABLET | Freq: Four times a day (QID) | ORAL | Status: DC | PRN

## 2017-03-01 MED ORDER — PROPOFOL 1000 MG/100ML IV EMUL
INTRAVENOUS | Status: AC
  Filled 2017-03-01: qty 100

## 2017-03-01 MED ORDER — ONDANSETRON HCL 4 MG PO TABS: 4.0000 mg | ORAL_TABLET | Freq: Four times a day (QID) | ORAL | Status: DC | PRN

## 2017-03-01 MED ORDER — IPRATROPIUM-ALBUTEROL 0.5-2.5 (3) MG/3ML IN SOLN
3.0000 mL | Freq: Four times a day (QID) | RESPIRATORY_TRACT | Status: DC | PRN
  Administered 2017-03-02: 3 mL via RESPIRATORY_TRACT
  Filled 2017-03-01: qty 3

## 2017-03-01 MED ORDER — JEVITY 1.2 CAL PO LIQD
474.0000 mL | Freq: Four times a day (QID) | ORAL | Status: DC
  Administered 2017-03-02 – 2017-03-03 (×7): 474 mL
  Filled 2017-03-01 (×11): qty 474

## 2017-03-01 MED ORDER — KETAMINE HCL-SODIUM CHLORIDE 100-0.9 MG/10ML-% IV SOSY
PREFILLED_SYRINGE | INTRAVENOUS | Status: AC
  Filled 2017-03-01: qty 10

## 2017-03-01 MED ORDER — NAPHAZOLINE-PHENIRAMINE 0.025-0.3 % OP SOLN
2.0000 [drp] | Freq: Four times a day (QID) | OPHTHALMIC | Status: DC | PRN
  Filled 2017-03-01: qty 5

## 2017-03-01 MED ORDER — PROPOFOL 10 MG/ML IV BOLUS
INTRAVENOUS | Status: AC
  Filled 2017-03-01: qty 20

## 2017-03-01 MED ORDER — LOSARTAN POTASSIUM 50 MG PO TABS
100.0000 mg | ORAL_TABLET | Freq: Every day | ORAL | Status: DC
  Administered 2017-03-02 – 2017-03-03 (×2): 100 mg
  Filled 2017-03-01 (×2): qty 2

## 2017-03-01 MED ORDER — KETAMINE HCL 10 MG/ML IJ SOLN
INTRAMUSCULAR | Status: AC | PRN
  Administered 2017-03-01: 70 mg via INTRAVENOUS
  Administered 2017-03-01: 50 mg via INTRAVENOUS
  Administered 2017-03-01: 70 mg via INTRAVENOUS

## 2017-03-01 MED ORDER — ONDANSETRON HCL 4 MG/2ML IJ SOLN: 4.0000 mg | Freq: Four times a day (QID) | INTRAMUSCULAR | Status: DC | PRN

## 2017-03-01 MED ORDER — OXYMETAZOLINE HCL 0.05 % NA SOLN: 2.0000 | Freq: Two times a day (BID) | NASAL | Status: DC | PRN

## 2017-03-01 NOTE — ED Notes (Signed)
Patient pulled out trach tube, stated that he couldn't breathe. Site began bleeding and patient became combative. EDP and RRT at bedside. SpO2 decreased to 40s with good waveform. Patient placed on NRB 15L, O2 sat in 80s now. ENT MD at bedside assisting with suction and replacing of trach.

## 2017-03-01 NOTE — ED Notes (Signed)
Checked on pt and helped put O2 back on him and gave him tissues

## 2017-03-01 NOTE — Code Documentation (Signed)
ENT inserting 6 cuff shiley

## 2017-03-01 NOTE — Code Documentation (Signed)
ENT attempting dilate trachea with 7 Glennon Mac

## 2017-03-01 NOTE — Code Documentation (Signed)
ENT attempting dilate trachea with 8 Glennon Mac

## 2017-03-01 NOTE — ED Notes (Signed)
Checked with pt and updated him.  Made some phone calls for him and left messages.  Pt is ambulatory in the waiting area.  Pt VS checked.  Spo2 is 97% on RA.   Pt requests O2.  EMT to bring this to him

## 2017-03-01 NOTE — Consult Note (Signed)
Nicholas Cobb, Steil 67 y.o., male [id number]     Chief Complaint: .  Breathing difficulty.  HPI: Patient is 67 year old black male with a long history of head and neck cancer, status post multiple surgeries and radiation.  He has had a tracheostomy tube for several years.  [facility]  He has pulled the tube out numerous times with the feeling that it might be blocked requiring emergent replacement.  Earlier today, he pulled the tube out moderately and then began having dyspnea for several hours later.  The emergency room physicians were unable to replace the tracheostomy tube, nor to assess the airway.  ENT was called for assistance.  He did have significant desaturation in the emergency room but no arrest.  Saturation was improved with supplemental oxygen.  He apparently has had a 6 extended length cuffless tracheostomy tube in place.  It is unclear why he has an extended length tube.  PMH: Past Medical History:  Diagnosis Date  . Allergy   . Cancer ([place]) 1999   throat cancer  . Cervical spondylolysis 04/30/2011   severe  . Cervical spondylosis 03/25/2011  . Change in voice   . Chronic pain 03/25/2011  . Chronic sinusitis 04/27/2011  . Depression   . Difficulty urinating   . Emphysema 03/25/2011  . Essential hypertension, benign 12/03/2012  . GERD (gastroesophageal reflux disease) 03/25/2011  . Hearing loss   . Impaired glucose tolerance 04/27/2011  . 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 Rash   . Trouble swallowing   . Type II or unspecified type diabetes mellitus without mention of complication, uncontrolled 04/27/2011  . Ulcer   . UTI (lower urinary tract infection)   . Xerostomia 03/25/2011    Surg Hx: Past Surgical History:  Procedure Laterality Date  . GASTROSTOMY TUBE PLACEMENT    . IR GENERIC HISTORICAL  04/03/2016   IR CM INJ ANY COLONIC TUBE W/FLUORO 04/03/2016 [name], [name]  . PEG TUBE PLACEMENT  11/1999  .  TRACHEOSTOMY  11/1999  . TRACHEOSTOMY TUBE PLACEMENT  11/24/2016   Procedure: EMERGENT TRACHEOSTOMY CHANGE;  Surgeon: Jerrell Belfast, MD;  Location: [facility];  Service: ENT;;  . TRACHEOSTOMY TUBE PLACEMENT N/A 12/29/2016   Procedure: TRACHEOSTOMY;  Surgeon: Melissa Montane, MD;  [facility];  Service: ENT;  Laterality: N/A;    FHx:   Family History  Problem Relation Age of Onset  . Diabetes Mother   . Heart disease Father   . Stroke Other   . Diabetes Other   . Cancer Other        lung cancer   SocHx:  reports that he quit smoking about 20 years ago. He has a 23.00 pack-year smoking history. he has never used smokeless tobacco. He reports that he does not drink alcohol or use drugs.  ALLERGIES:  Allergies  Allergen Reactions  . Oxybutynin Chloride Nausea And Vomiting  . Codeine Nausea And Vomiting and Rash     (Not in a hospital admission)  Results for orders placed or performed during the hospital encounter of 03/01/17 (from the past 48 hour(s))  Comprehensive metabolic panel     Status: Abnormal   Collection Time: 03/01/17  7:40 PM  Result Value Ref Range   Sodium 131 (L) 135 - 145 mmol/L   Potassium 4.3 3.5 - 5.1 mmol/L   Chloride 94 (L) 101 - 111 mmol/L   CO2 23 22 - 32 mmol/L   Glucose, Bld 175 (H)  65 - 99 mg/dL   BUN 18 6 - 20 mg/dL   Creatinine, Ser 1.00 0.61 - 1.24 mg/dL   Calcium 9.3 8.9 - 10.3 mg/dL   Total Protein 8.2 (H) 6.5 - 8.1 g/dL   Albumin 4.0 3.5 - 5.0 g/dL   AST 52 (H) 15 - 41 U/L   ALT 31 17 - 63 U/L   Alkaline Phosphatase 132 (H) 38 - 126 U/L   Total Bilirubin 0.5 0.3 - 1.2 mg/dL   GFR calc non Af Amer >60 >60 mL/min   GFR calc Af Amer >60 >60 mL/min    Comment: (NOTE) The eGFR has been calculated using the CKD EPI equation. This calculation has not been validated in all clinical situations. eGFR's persistently <60 mL/min signify possible Chronic Kidney Disease.    Anion gap 14 5 - 15  CBC with Differential     Status: Abnormal    Collection Time: 03/01/17  7:40 PM  Result Value Ref Range   WBC 7.6 4.0 - 10.5 K/uL   RBC 4.85 4.22 - 5.81 MIL/uL   Hemoglobin 14.8 13.0 - 17.0 g/dL   HCT 44.1 39.0 - 52.0 %   MCV 90.9 78.0 - 100.0 fL   MCH 30.5 26.0 - 34.0 pg   MCHC 33.6 30.0 - 36.0 g/dL   RDW 13.4 11.5 - 15.5 %   Platelets 273 150 - 400 K/uL   Neutrophils Relative % 45 %   Lymphocytes Relative 35 %   Monocytes Relative 18 %   Eosinophils Relative 2 %   Basophils Relative 0 %   Neutro Abs 3.3 1.7 - 7.7 K/uL   Lymphs Abs 2.7 0.7 - 4.0 K/uL   Monocytes Absolute 1.4 (H) 0.1 - 1.0 K/uL   Eosinophils Absolute 0.2 0.0 - 0.7 K/uL   Basophils Absolute 0.0 0.0 - 0.1 K/uL   WBC Morphology ATYPICAL LYMPHOCYTES   Protime-INR     Status: None   Collection Time: 03/01/17  7:40 PM  Result Value Ref Range   Prothrombin Time 13.5 11.4 - 15.2 seconds   INR 1.04    Dg Chest Portable 1 View  Result Date: 03/01/2017 CLINICAL DATA:  Status post complicated tracheostomy change EXAM: PORTABLE CHEST 1 VIEW COMPARISON:  12/29/2016 chest radiograph. FINDINGS: Tracheostomy tube tip overlies the tracheal air column at the thoracic inlet. Partially visualized surgical plate, screws and clips in the right lower neck. Stable cardiomediastinal silhouette with normal heart size and aortic atherosclerosis. No pneumothorax. No pleural effusion. No pulmonary edema. Mild bibasilar scarring versus atelectasis. No acute consolidative airspace disease. IMPRESSION: 1. Tracheostomy tube appears well-positioned. 2. Mild bibasilar scarring versus atelectasis. Electronically Signed   By: Ilona Sorrel M.D.   On: 03/01/2017 20:03    ROS:  Could not be obtained  Blood pressure 116/71, pulse 69, temperature 98.4 F (36.9 C), temperature source Oral, resp. rate 14, SpO2 99 %.  PHYSICAL EXAM: Overall appearance:  He has massive facial edema bilaterally including mid face and lips.  He has surgical changes on both neck.  He has extremely severe radiation  fibrosis on both sides.  He is very kyphotic.  He cannot extend his neck.  There is some bleeding from the tracheostomy site.  He is combative. Head:  As abo e Ears: not examined Nose:LEFTward septal deviation.   Oral Cavity:swollen tongue.  I could not assess the pharynx or larynx either directly or using the flexible laryngoscope through the nose.   Neuro:grossly intact.   He is  very strong. Neck:  trach stoma is small with some bleeding and granulation.    I was unable to assess the pharynx with the flexible laryngoscope through either side of the nose.  I was able to pass the laryngoscope into the tracheal stoma and noted some bleeding and some erosion of the trachea but patent airway.  I anesthetized around the stoma with 1% Xylocaine with 1-100,000 epinephrine.  I was able to intubate the stoma with a #5, then a #6 endotracheal tube.  We gave him some ketamine for sedation with 2 repetitions. Finally, I was able to Dilate the tracheostomy tract  with a 5, 7, and 8 stainless steel tracheostomy tube.  I placed a #6 cuffed Shiley tracheostomy tube.  Observing positioning with the flexible scope inside the tracheostomy tube, the tube was present inside the tracheal lumen.  Hemostasis was observed.    Assessment/Plan Chronic sequelae related to surgery and radiation for head neck cancer.  Tracheostomy tube dislodgment.  Plan: I replaced the tracheostomy tube as above.  Hospitalists may choose to observe overnight.  He should follow-up with his primary otolaryngologists at [facility].  Jodi Marble 2/50/0370, 10:32 PM

## 2017-03-01 NOTE — ED Triage Notes (Signed)
Pt states he needs to be seen in the trach clinic, states his trach is short and reports he needs a longer one. Pt denies sob. Staff attempted to take patient to trach clinic but was unable to be seen because he did not have an appt. Pt in nad.

## 2017-03-01 NOTE — Code Documentation (Signed)
Patient alert,

## 2017-03-01 NOTE — Code Documentation (Signed)
ENT attempting dilate trachea with 5 Glennon Mac

## 2017-03-01 NOTE — H&P (Signed)
History and Physical    Nicholas Cobb XBJ:[id number] DOB: Jul 08, 1950 DOA: 03/01/2017  PCP: [name], NP  Patient coming from: Home.  Chief Complaint: Tracheostomy tube problem.  HPI: Nicholas Cobb is a 67 y.o. male with history of head and neck cancer status post extensive surgery and radiation patient is trach and PEG dependent, diabetes mellitus type 2 on diet, COPD and hypertension came to the ER after patient felt that his trach may be blocked and he removed it himself.  Patient is presently sedated and had no further history available.  ED Course: In the ER patient's trach was difficult to be replaced by the ER physician and ENT consult had to be obtained with Dr. Lonia Farber.  Patient's trach tube was replaced but since patient had significant desaturation ENT and advised observation overnight.  Review of Systems: As per HPI, rest all negative.   Past Medical History:  Diagnosis Date  . Allergy   . Cancer ([place]) 1999   throat cancer  . Cervical spondylolysis 04/30/2011   severe  . Cervical spondylosis 03/25/2011  . Change in voice   . Chronic pain 03/25/2011  . Chronic sinusitis 04/27/2011  . Depression   . Difficulty urinating   . Emphysema 03/25/2011  . Essential hypertension, benign 12/03/2012  . GERD (gastroesophageal reflux disease) 03/25/2011  . Hearing loss   . Impaired glucose tolerance 04/27/2011  . 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 Rash   . Trouble swallowing   . Type II or unspecified type diabetes mellitus without mention of complication, uncontrolled 04/27/2011  . Ulcer   . UTI (lower urinary tract infection)   . Xerostomia 03/25/2011    Past Surgical History:  Procedure Laterality Date  . GASTROSTOMY TUBE PLACEMENT    . IR GENERIC HISTORICAL  04/03/2016   IR CM INJ ANY COLONIC TUBE W/FLUORO 04/03/2016 [name], [name]  . PEG TUBE PLACEMENT  11/1999  . TRACHEOSTOMY  11/1999  . TRACHEOSTOMY TUBE PLACEMENT  11/24/2016   Procedure:  EMERGENT TRACHEOSTOMY CHANGE;  Surgeon: Jerrell Belfast, MD;  Location: [facility];  Service: ENT;;  . TRACHEOSTOMY TUBE PLACEMENT N/A 12/29/2016   Procedure: TRACHEOSTOMY;  Surgeon: Melissa Montane, MD;  Location: [place];  Service: ENT;  Laterality: N/A;     reports that he quit smoking about 20 years ago. He has a 23.00 pack-year smoking history. he has never used smokeless tobacco. He reports that he does not drink alcohol or use drugs.  Allergies  Allergen Reactions  . Oxybutynin Chloride Nausea And Vomiting  . Codeine Nausea And Vomiting and Rash    Family History  Problem Relation Age of Onset  . Diabetes Mother   . Heart disease Father   . Stroke Other   . Diabetes Other   . Cancer Other        lung cancer    Prior to Admission medications   Medication Sig Start Date End Date Taking? Authorizing Provider  albuterol (PROVENTIL HFA;VENTOLIN HFA) 108 (90 Base) MCG/ACT inhaler Inhale 2 puffs into the lungs every 4 (four) hours as needed for wheezing or shortness of breath. 03/09/16  Yes [name], NP  IBU 400 MG tablet take 1 tablet by mouth three times a day if needed for pain Patient taking differently: take 400 mg tablet by mouth three times a day if needed for pain 01/17/17  Yes Eubanks, Janett Billow K, NP  ipratropium-albuterol (DUONEB) 0.5-2.5 (3) MG/3ML SOLN Take 3 mLs by nebulization every 6 (  six) hours as needed. Patient taking differently: Take 3 mLs by nebulization every 6 (six) hours as needed (for wheezing or shortness of breath).  08/01/16  Yes Robyn Haber, MD  losartan (COZAAR) 100 MG tablet Place 1 tablet (100 mg total) into feeding tube daily. 11/27/16  Yes Cherene Altes, MD  naphazoline-pheniramine (NAPHCON-A) 0.025-0.3 % ophthalmic solution Place 2 drops into both eyes 4 (four) times daily as needed for eye irritation or allergies.    Yes [provider]  Nutritional Supplements (FEEDING SUPPLEMENT, JEVITY 1.2 CAL,) LIQD Place 474 mLs into feeding tube  every 6 (six) hours. 11/27/16  Yes Cherene Altes, MD  oxymetazoline (12 HOUR NASAL SPRAY) 0.05 % nasal spray Place 2 sprays into the nose 2 (two) times daily as needed for congestion.    Yes [provider]  AMBULATORY NON FORMULARY MEDICATION Trach(Shiley 6.0 XLT UP uncuffed), [place]ck, Garrett holders, Disposable inner cannulas 07/08/15   Gildardo Cranker, DO  betamethasone dipropionate 0.05 % lotion Apply topically 2 (two) times daily. To rash on leg 10/20/15   Gildardo Cranker, DO  ranitidine (ZANTAC) 150 MG tablet Take 150 mg per tube two times a day 11/27/16   Cherene Altes, MD  triamcinolone cream (KENALOG) 0.1 % Apply 1 application topically 2 (two) times daily. APPLY TO AFFECTED AREAS 11/27/16   Cherene Altes, MD    Physical Exam: Vitals:   03/01/17 2145 03/01/17 2230 03/01/17 2245 03/01/17 2300  BP: 116/71 (!) 173/98 (!) 146/96 137/85  Pulse: 69 71 72 72  Resp: '14 18 17 16  '$ Temp:      TempSrc:      SpO2: 99% 100% 99% 99%      Constitutional: Moderately built and nourished. Vitals:   03/01/17 2145 03/01/17 2230 03/01/17 2245 03/01/17 2300  BP: 116/71 (!) 173/98 (!) 146/96 137/85  Pulse: 69 71 72 72  Resp: '14 18 17 16  '$ Temp:      TempSrc:      SpO2: 99% 100% 99% 99%   Eyes: Anicteric no pallor. ENMT: Patient has multiple deformities due to previous surgery. Neck: Trach tube in place. Respiratory: No rhonchi or crepitations. Cardiovascular: S1-S2 heard. Abdomen: PEG tube in place nontender. Musculoskeletal: No edema. Skin: No rash around the PEG tube site. Neurologic: Patient is sedated from receiving sedation during trach tube placement. Psychiatric: Patient is sedated.   Labs on Admission: I have personally reviewed following labs and imaging studies  CBC: Recent Labs  Lab 03/01/17 1940  WBC 7.6  NEUTROABS 3.3  HGB 14.8  HCT 44.1  MCV 90.9  PLT 627   Basic Metabolic Panel: Recent Labs  Lab 03/01/17 1940  NA 131*  K 4.3  CL  94*  CO2 23  GLUCOSE 175*  BUN 18  CREATININE 1.00  CALCIUM 9.3   GFR: CrCl cannot be calculated (Unknown ideal weight.). Liver Function Tests: Recent Labs  Lab 03/01/17 1940  AST 52*  ALT 31  ALKPHOS 132*  BILITOT 0.5  PROT 8.2*  ALBUMIN 4.0   No results for input(s): LIPASE, AMYLASE in the last 168 hours. No results for input(s): AMMONIA in the last 168 hours. Coagulation Profile: Recent Labs  Lab 03/01/17 1940  INR 1.04   Cardiac Enzymes: No results for input(s): CKTOTAL, CKMB, CKMBINDEX, TROPONINI in the last 168 hours. BNP (last 3 results) No results for input(s): PROBNP in the last 8760 hours. HbA1C: No results for input(s): HGBA1C in the last 72 hours. CBG: No  results for input(s): GLUCAP in the last 168 hours. Lipid Profile: No results for input(s): CHOL, HDL, LDLCALC, TRIG, CHOLHDL, LDLDIRECT in the last 72 hours. Thyroid Function Tests: No results for input(s): TSH, T4TOTAL, FREET4, T3FREE, THYROIDAB in the last 72 hours. Anemia Panel: No results for input(s): VITAMINB12, FOLATE, FERRITIN, TIBC, IRON, RETICCTPCT in the last 72 hours. Urine analysis:    Component Value Date/Time   COLORURINE YELLOW 09/14/2015 0910   APPEARANCEUR CLEAR 09/14/2015 0910   LABSPEC 1.013 09/14/2015 0910   PHURINE 7.5 09/14/2015 0910   GLUCOSEU NEGATIVE 09/14/2015 0910   GLUCOSEU NEGATIVE 10/29/2013 1118   HGBUR NEGATIVE 09/14/2015 0910   BILIRUBINUR NEGATIVE 09/14/2015 0910   BILIRUBINUR Negative 10/08/2014 0930   KETONESUR NEGATIVE 09/14/2015 0910   PROTEINUR NEGATIVE 09/14/2015 0910   UROBILINOGEN 0.2 10/08/2014 0930   UROBILINOGEN 0.2 10/29/2013 1118   NITRITE NEGATIVE 09/14/2015 0910   LEUKOCYTESUR NEGATIVE 09/14/2015 0910   Sepsis Labs: '@LABRCNTIP'$ (procalcitonin:4,lacticidven:4) )No results found for this or any previous visit (from the past 240 hour(s)).   Radiological Exams on Admission: Dg Chest Portable 1 View  Result Date: 03/01/2017 CLINICAL DATA:   Status post complicated tracheostomy change EXAM: PORTABLE CHEST 1 VIEW COMPARISON:  12/29/2016 chest radiograph. FINDINGS: Tracheostomy tube tip overlies the tracheal air column at the thoracic inlet. Partially visualized surgical plate, screws and clips in the right lower neck. Stable cardiomediastinal silhouette with normal heart size and aortic atherosclerosis. No pneumothorax. No pleural effusion. No pulmonary edema. Mild bibasilar scarring versus atelectasis. No acute consolidative airspace disease. IMPRESSION: 1. Tracheostomy tube appears well-positioned. 2. Mild bibasilar scarring versus atelectasis. Electronically Signed   By: Ilona Sorrel M.D.   On: 03/01/2017 20:03     Assessment/Plan Principal Problem:   Tracheostomy malfunction [facility]) Active Problems:   Pulmonary emphysema (HCC)   Essential hypertension   Respiratory failure (HCC)   Chronic respiratory failure ([place])    1. Tracheostomy tube malfunction -appreciate ENT consult patient's tracheostomy tube was replaced.  Closely observe and stepdown. 2. COPD presently not wheezing.  Continue inhalers. 3. Hypertension on Cozaar which will be continued. 4. PEG tube feeds. 5. Diabetes mellitus type 2 presently not on any medications.  Will keep patient on sliding scale coverage. 6. Head and neck cancer status post multiple surgeries and presently trach and PEG tube dependent needs to follow-up with [facility].   DVT prophylaxis: SCDs. Code Status: Full code. Family Communication: No family at the bedside. Disposition Plan: Home. Consults called: ENT. Admission status: Observation.   Rise Patience MD [facility] Pager [phone].  If 7PM-7AM, please contact night-coverage [email or web address] Password [facility]  03/01/2017, 11:09 PM

## 2017-03-01 NOTE — ED Provider Notes (Addendum)
Emergency Department Provider Note   I have reviewed the triage vital signs and the nursing notes.   HISTORY  Chief Complaint Tracheostomy Tube Change   HPI Nicholas Cobb is a 67 y.o. male with h/o throat cancer, emphysema, HTN, and DM presents to the emergency department for evaluation of trach that was removed by the patient.  He is unable to speak but initially is writing to [place] on a pad saying that he pulled the trach out at 2:30 PM today because he could not breathe.  He presented to the hospitals trach clinic and was unable to be seen because he did not have an appointment.  He was referred to the emergency department where he presented through the waiting room.  He is known to our ENT department only through an emergent procedure and November 2018.  He primarily receives his care at [facility].    Level 5 caveat: Trach dependent and respiratory distress.    Past Medical History:  Diagnosis Date  . Allergy   . Cancer ([place]) 1999   throat cancer  . Cervical spondylolysis 04/30/2011   severe  . Cervical spondylosis 03/25/2011  . Change in voice   . Chronic pain 03/25/2011  . Chronic sinusitis 04/27/2011  . Depression   . Difficulty urinating   . Emphysema 03/25/2011  . Essential hypertension, benign 12/03/2012  . GERD (gastroesophageal reflux disease) 03/25/2011  . Hearing loss   . Impaired glucose tolerance 04/27/2011  . 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 Rash   . Trouble swallowing   . Type II or unspecified type diabetes mellitus without mention of complication, uncontrolled 04/27/2011  . Ulcer   . UTI (lower urinary tract infection)   . Xerostomia 03/25/2011    Patient Active Problem List   Diagnosis Date Noted  . Tracheostomy malfunction ([place]) 03/01/2017  . Trachea displaced 12/29/2016  . Other tracheostomy complication ([place]) 58/52/7782  . Radionecrosis   . Tracheostomy care ([place])   . Hypoxemia   . Hemoptysis 01/26/2016  . Throat cancer ([place])   . Controlled  diabetes mellitus type 2 with complications ([place])   . Chronic respiratory failure ([place]) 06/14/2015  . Respiratory failure ([place]) 06/13/2015  . Tracheostomy dependence ([place])   . Cellulitis and abscess of head   . Cellulitis of face   . Tracheostomy status ([place])   . Acute sinus infection 10/29/2013  . Rash 10/29/2013  . Hyponatremia 12/03/2012  . Essential hypertension 12/03/2012  . Cervical spondylolysis 04/30/2011  . Chronic sinusitis 04/27/2011  . Diabetes 04/27/2011  . Erectile dysfunction 04/19/2011  . Preventative health care 03/25/2011  . Cervical spondylosis 03/25/2011  . Pulmonary emphysema ([place]) 03/25/2011  . GERD (gastroesophageal reflux disease) 03/25/2011  . Chronic pain 03/25/2011  . 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 Xerostomia 03/25/2011  . Tracheostomy in place [facility]) 02/04/2011  . Laryngeal cancer ([place]) 02/04/2011  . Encounter for PEG (percutaneous endoscopic gastrostomy) ([place]) 02/04/2011  . Attention to G-tube [facility]) 11/07/2010    Past Surgical History:  Procedure Laterality Date  . GASTROSTOMY TUBE PLACEMENT    . IR GENERIC HISTORICAL  04/03/2016   IR CM INJ ANY COLONIC TUBE W/FLUORO 04/03/2016 [name], [name]  . PEG TUBE PLACEMENT  11/1999  . TRACHEOSTOMY  11/1999  . TRACHEOSTOMY TUBE PLACEMENT  11/24/2016   Procedure: EMERGENT TRACHEOSTOMY CHANGE;  Surgeon: Jerrell Belfast, MD;  Location: [facility];  Service: ENT;;  . TRACHEOSTOMY TUBE PLACEMENT N/A 12/29/2016   Procedure: TRACHEOSTOMY;  Surgeon: Melissa Montane,  MD;  Location: [facility];  Service: ENT;  Laterality: N/A;    Current Outpatient Rx  . Order #: [id number] Class: Normal  . Order #: [id number] Class: Normal  . Order #: [id number] Class: Normal  . Order #: [id number] Class: No Print  . Order #: [id number] Class: Historical Med  . Order #: [id number] Class: No Print  . Order #: [id number] Class: Historical Med  . Order #: [id number] Class: Print  . Order #: [id number] Class: Normal  . Order #:  [id number] Class: No Print  . Order #: [id number] Class: No Print    Allergies Oxybutynin chloride and Codeine  Family History  Problem Relation Age of Onset  . Diabetes Mother   . Heart disease Father   . Stroke Other   . Diabetes Other   . Cancer Other        lung cancer    Social History Social History   Tobacco Use  . Smoking status: Former Smoker    Packs/day: 1.00    Years: 23.00    Pack years: 23.00    Last attempt to quit: 11/06/1996    Years since quitting: 20.3  . Smokeless tobacco: Never Used  Substance Use Topics  . Alcohol use: No    Alcohol/week: 0.0 oz  . Drug use: No    Review of Systems  Level 5 caveat: Acute respiratory distress.   ____________________________________________   PHYSICAL EXAM:  VITAL SIGNS: ED Triage Vitals  Enc Vitals Group     BP 03/01/17 1633 128/70     Pulse Rate 03/01/17 1633 85     Resp 03/01/17 1633 (!) 26     Temp 03/01/17 1633 98.4 F (36.9 C)     Temp Source 03/01/17 1633 Oral     SpO2 03/01/17 1633 95 %     Pain Score 03/01/17 1644 0   Constitutional: Patient with course upper airway sounds. Chronically ill-appearing with diffuse lip/tongue edema.  Eyes: Conjunctivae are dry.  Head: Atraumatic. Nose: No congestion/rhinnorhea. Mouth/Throat: Mucous membranes are moist.  Neck: Some course sounds and blood coming from the tracheostomy site. The ostomy is very small and difficulty to visualize directly with patient's baseline flexed neck position and and altered neck anatomy.  Cardiovascular: Normal rate, regular rhythm. Good peripheral circulation. Grossly normal heart sounds.   Respiratory: Significant increased respiratory effort.  No retractions. Lungs are CTABL.  Gastrointestinal:  No distention.  Musculoskeletal: No gross deformities of extremities. Neurologic: No gross focal neurologic deficits are appreciated.  Skin:  Skin is warm, dry and intact. No rash  noted.  ____________________________________________   LABS (all labs ordered are listed, but only abnormal results are displayed)  Labs Reviewed  COMPREHENSIVE METABOLIC PANEL - Abnormal; Notable for the following components:      Result Value   Sodium 131 (*)    Chloride 94 (*)    Glucose, Bld 175 (*)    Total Protein 8.2 (*)    AST 52 (*)    Alkaline Phosphatase 132 (*)    All other components within normal limits  CBC WITH DIFFERENTIAL/PLATELET - Abnormal; Notable for the following components:   Monocytes Absolute 1.4 (*)    All other components within normal limits  PROTIME-INR  BASIC METABOLIC PANEL  CBC   ____________________________________________  EKG   EKG Interpretation  Date/Time:  Thursday March 01 2017 18:05:04 EST Ventricular Rate:  106 PR Interval:    QRS Duration: 80 QT Interval:  329 QTC Calculation: 437 R Axis:   87 Text Interpretation:  Sinus tachycardia Supraventricular bigeminy Right  atrial enlargement Borderline right axis deviation No STEMI.  Confirmed by Nanda Quinton [id number]) on 03/01/2017 11:32:12 PM       ____________________________________________  RADIOLOGY  Dg Chest Portable 1 View  Result Date: 03/01/2017 CLINICAL DATA:  Status post complicated tracheostomy change EXAM: PORTABLE CHEST 1 VIEW COMPARISON:  12/29/2016 chest radiograph. FINDINGS: Tracheostomy tube tip overlies the tracheal air column at the thoracic inlet. Partially visualized surgical plate, screws and clips in the right lower neck. Stable cardiomediastinal silhouette with normal heart size and aortic atherosclerosis. No pneumothorax. No pleural effusion. No pulmonary edema. Mild bibasilar scarring versus atelectasis. No acute consolidative airspace disease. IMPRESSION: 1. Tracheostomy tube appears well-positioned. 2. Mild bibasilar scarring versus atelectasis. Electronically Signed   By: Ilona Sorrel M.D.   On: 03/01/2017 20:03     ____________________________________________   PROCEDURES  Procedure(s) performed:   .Critical Care Performed by: Margette Fast, MD Authorized by: Margette Fast, MD   Critical care provider statement:    Critical care time (minutes):  70   Critical care time was exclusive of:  Separately billable procedures and treating other patients and teaching time   Critical care was necessary to treat or prevent imminent or life-threatening deterioration of the following conditions:  Respiratory failure   Critical care was time spent personally by me on the following activities:  Blood draw for specimens, development of treatment plan with patient or surrogate, discussions with consultants, evaluation of patient's response to treatment, examination of patient, ordering and performing treatments and interventions, ordering and review of laboratory studies, ordering and review of radiographic studies, pulse oximetry, re-evaluation of patient's condition, review of old charts and obtaining history from patient or surrogate   I assumed direction of critical care for this patient from another provider in my specialty: no    .Sedation Date/Time: 03/02/2017 8:36 AM Performed by: Margette Fast, MD Authorized by: Margette Fast, MD   Consent:    Consent obtained:  Emergent situation   Alternatives discussed:  Analgesia without sedation and regional anesthesia Universal protocol:    Immediately prior to procedure a time out was called: yes   Indications:    Sedation is required to allow for: tracheostomy dilation.   Procedure necessitating sedation performed by:  Different physician   Intended level of sedation:  Moderate (conscious sedation) Pre-sedation assessment:    Time since last food or drink:  Unknown   NPO status caution: unable to specify NPO status     ASA classification: class 5 - moribund patient, not expected to live without the procedure     Neck mobility: reduced     Mouth  opening:  Unable to open   Mallampati score:  Unable to assess   Pre-sedation assessments completed and reviewed: airway patency, cardiovascular function, hydration status, mental status, nausea/vomiting, pain level and respiratory function     History of difficult intubation: yes   Immediate pre-procedure details:    Reassessment: Patient reassessed immediately prior to procedure     Reviewed: vital signs, relevant labs/tests and NPO status     Verified: bag valve mask available, emergency equipment available, intubation equipment available, IV patency confirmed, oxygen available and suction available   Procedure details (see MAR for exact dosages):    Preoxygenation:  Blow-by   Sedation:  Ketamine   Intra-procedure monitoring:  Blood pressure monitoring, cardiac monitor, continuous pulse oximetry, continuous capnometry, frequent LOC assessments and frequent vital sign checks   Intra-procedure events: none     Intra-procedure management:  Supplemental oxygen   Total Provider sedation time (minutes):  45 Post-procedure details:    Attendance: Constant attendance by certified staff until patient recovered     Recovery: Patient returned to pre-procedure baseline     Post-sedation assessments completed and reviewed: airway patency, cardiovascular function, hydration status, mental status, nausea/vomiting, pain level, respiratory function and temperature     Patient is stable for discharge or admission: yes     Patient tolerance:  Tolerated well, no immediate complications     ____________________________________________   INITIAL IMPRESSION / ASSESSMENT AND PLAN / ED COURSE  Pertinent labs & imaging results that were available during my care of the patient were reviewed by me and considered in my medical decision making (see chart for details).  The patient presented to the emergency department through the waiting room for evaluation of trach which she had removed at 2:30 PM.  He is  initially satting well on room air and writing to [place] with a pen and paper.  He has notable upper airway edema and significant reduction in neck mobility secondary to skin tightening and consistent with history of neck cancer.  I immediately paged ENT and got in contact with Dr. Erik Obey who came came in immediately.  While on the phone with him the patient began touching his tracheostomy site where some blood began coming out.  He became more combative with staff.  We were able to establish IV access and provide blow-by oxygen.  Patient initially became very hypoxemic and I attempted to visualize the patient's airway with small size intubating bronchoscope but was unsuccessful.  The tracheostomy is very small in caliber with some blood in the airway.   Dr. Erik Obey arrived and was ultimately able to secure the airway after serial dilations and bronchoscopy.  I provided procedural sedation with ketamine as above.  Patient was given a 6.0 cuffed [place] and placed on trach collar.  Given the possibility of obstructive pulmonary edema Dr. Erik Obey like the patient to be observed overnight in a stepdown ICU setting.   Discussed patient's case with Hospitalist, Dr. Hal Hope to request admission. Patient and family (if present) updated with plan. Care transferred to Hospitalist service.  I reviewed all nursing notes, vitals, pertinent old records, EKGs, labs, imaging (as available).  ____________________________________________  FINAL CLINICAL IMPRESSION(S) / ED DIAGNOSES  Final diagnoses:  Hemorrhage from tracheostomy stoma ([place])  Tracheostomy, acute management ([place])     MEDICATIONS GIVEN DURING THIS VISIT:  Medications  Ketamine HCl-Sodium Chloride 100-0.9 MG/10ML-% SOSY (not administered)  Ketamine HCl-Sodium Chloride 100-0.9 MG/10ML-% SOSY (not administered)  ibuprofen (ADVIL,MOTRIN) tablet 400 mg (not administered)  ipratropium-albuterol (DUONEB) 0.5-2.5 (3) MG/3ML nebulizer solution 3 mL (not  administered)  losartan (COZAAR) tablet 100 mg (not administered)  naphazoline-pheniramine (NAPHCON-A) 0.025-0.3 % ophthalmic solution 2 drop (not administered)  feeding supplement (JEVITY 1.2 CAL) liquid 474 mL (not administered)  oxymetazoline (AFRIN) 0.05 % nasal spray 2 spray (not administered)  famotidine (PEPCID) tablet 20 mg (not administered)  triamcinolone cream (KENALOG) 0.1 % 1 application (not administered)  acetaminophen (TYLENOL) tablet 650 mg (not administered)    Or  acetaminophen (TYLENOL) suppository 650 mg (not administered)  ondansetron (ZOFRAN) tablet 4 mg (not administered)    Or  ondansetron (ZOFRAN) injection 4 mg (not administered)  insulin aspart (novoLOG) injection 0-9 Units (not administered)  ketamine (KETALAR) injection (70 mg Intravenous Given 03/01/17 1912)     Note:  This document was prepared using Dragon voice recognition software and may include unintentional dictation  errors.  Nanda Quinton, MD Emergency Medicine    Delmar Arriaga, [facility], MD 03/01/17 7412    Margette Fast, MD 03/02/17 [phone]

## 2017-03-02 ENCOUNTER — Encounter (HOSPITAL_COMMUNITY): Payer: Self-pay

## 2017-03-02 DIAGNOSIS — I10 Essential (primary) hypertension: Secondary | ICD-10-CM | POA: Diagnosis not present

## 2017-03-02 DIAGNOSIS — J9610 Chronic respiratory failure, unspecified whether with hypoxia or hypercapnia: Secondary | ICD-10-CM

## 2017-03-02 DIAGNOSIS — J9503 Malfunction of tracheostomy stoma: Secondary | ICD-10-CM | POA: Diagnosis not present

## 2017-03-02 LAB — CBC
HEMATOCRIT: 42.3 % (ref 39.0–52.0)
HEMOGLOBIN: 14.4 g/dL (ref 13.0–17.0)
MCH: 30.3 pg (ref 26.0–34.0)
MCHC: 34 g/dL (ref 30.0–36.0)
MCV: 89.1 fL (ref 78.0–100.0)
Platelets: 256 10*3/uL (ref 150–400)
RBC: 4.75 MIL/uL (ref 4.22–5.81)
RDW: 13.4 % (ref 11.5–15.5)
WBC: 6.1 10*3/uL (ref 4.0–10.5)

## 2017-03-02 LAB — BASIC METABOLIC PANEL
Anion gap: 11 (ref 5–15)
BUN: 14 mg/dL (ref 6–20)
CALCIUM: 9.4 mg/dL (ref 8.9–10.3)
CO2: 25 mmol/L (ref 22–32)
Chloride: 96 mmol/L — ABNORMAL LOW (ref 101–111)
Creatinine, Ser: 0.78 mg/dL (ref 0.61–1.24)
GFR calc Af Amer: >60 mL/min (ref >60)
GLUCOSE: 110 mg/dL — AB (ref 65–99)
Potassium: 3.8 mmol/L (ref 3.5–5.1)
Sodium: 132 mmol/L — ABNORMAL LOW (ref 135–145)

## 2017-03-02 LAB — MRSA PCR SCREENING: MRSA BY PCR: NEGATIVE

## 2017-03-02 LAB — GLUCOSE, CAPILLARY
GLUCOSE-CAPILLARY: 164 mg/dL — AB (ref 65–99)
GLUCOSE-CAPILLARY: 128 mg/dL — AB (ref 65–99)
Glucose-Capillary: 114 mg/dL — ABNORMAL HIGH (ref 65–99)
Glucose-Capillary: 87 mg/dL (ref 65–99)
Glucose-Capillary: 136 mg/dL — ABNORMAL HIGH (ref 65–99)

## 2017-03-02 MED ORDER — ORAL CARE MOUTH RINSE
15.0000 mL | Freq: Two times a day (BID) | OROMUCOSAL | Status: DC
  Administered 2017-03-02: 15 mL via OROMUCOSAL

## 2017-03-02 MED ORDER — HYDRALAZINE HCL 20 MG/ML IJ SOLN
10.0000 mg | INTRAMUSCULAR | Status: DC | PRN
  Administered 2017-03-02: 10 mg via INTRAVENOUS
  Filled 2017-03-02: qty 1

## 2017-03-02 MED ORDER — CHLORHEXIDINE GLUCONATE 0.12 % MT SOLN
15.0000 mL | Freq: Two times a day (BID) | OROMUCOSAL | Status: DC
  Administered 2017-03-02 – 2017-03-03 (×2): 15 mL via OROMUCOSAL
  Filled 2017-03-02 (×2): qty 15

## 2017-03-02 NOTE — Progress Notes (Signed)
BP still running high. 1700: 199/108. Messaged attending MD for PRN med

## 2017-03-02 NOTE — Discharge Summary (Addendum)
Physician Discharge Summary  Nicholas Cobb LFY:[id number] DOB: [birth year] DOA: 03/01/2017  PCP: [name], NP  Admit date: 03/01/2017 Discharge date: 03/03/2017  Admitted From: Home Disposition:  Home  Recommendations for Outpatient Follow-up:  1. Follow up with PCP in 1-weeks 2. Please obtain BMP/CBC in one week 3. Please follow up on the following pending results:  Home Health:  No  Equipment/Devices: No    Discharge Condition: Stable CODE STATUS: Full  Diet recommendation: heart healthy   Brief/Interim Summary: 67 y/o male presents to the ER with tracheostomy tube malfunction, has medical history significant for head and neck cancer status post extensive surgery and radiation, trach and PEG dependent, diabetes mellitus type 2, COPD, hypertension.  Patient reports feeling blockage in his trach tube, could not breathe, and removed the tube himself prior to ER.  Patient was sedated for tracheostomy dilation, trach tube was difficult to be replaced by ER physician and consulted ENT, and patient had significant desaturation in the process.  Trach tube is replaced. Initial BP 116/71, pulse 69, resp 14, SpO2 99%. Was awake and alert, his lungs are clear to auscultation no rhonchi or rales, heart S1-S2 present and rhythmic, his abdomen had a PEG tube in place, no lower extremity edema. Sodium 131, potassium 4.3, chloride 94, bicarbonate 23, glucose 175, bicarbonate 18, creatinine 1.0, white cell count 7.6, hemoglobin 14.8, hematocrit 44.1, platelets 273. EKG sinus tachycardiac, normal axis, normal intervals, PACs with bigeminy.  Chest x-ray mild bibasilar scarring versus atelectasis.   Patient admitted to the hospital working diagnosis of  possible obstructive pulmonary edema related to malfunctioning tracheostomy tube     1. Tracheostomy tube malfunctioning. Patient was seen at the ED by ENT and underwent:  Stoma intubation with a #5, then a #6 endotracheal tube.   Tracheostomy  tract dilatation with a 5, 7, and 8 stainless steel tracheostomy tube.    #6 cuffed Shiley tracheostomy tube placed.   Observing positioning with the flexible scope inside the tracheostomy tube, the tube was present inside the tracheal lumen.   Admitted to the step down unit for observation. He remained stable, oxygenating well per trach collar, adequate secretions management. Apparently patient had a long Shiley trach in place, currently patient on a regular size Shiley #6. Case discussed with Dr. Erik Obey.   2. COPD. Stable with no sings of exacerbation, continue with bronchodilator therapy.  3. HTN. Patient was continued with losartan.   4, T2DM. Patient was placed on insulin sliding scale for glucose cover and monitoring.  5. Head and Neck cancer. SP trach and peg. Will follow with [facility] as outpatient.      Discharge Diagnoses:  Principal Problem:   Tracheostomy malfunction [name]) Active Problems:   Pulmonary emphysema (HCC)   Essential hypertension   Respiratory failure (HCC)   Chronic respiratory failure ([place])    Discharge Instructions   Allergies as of 03/02/2017      Reactions   Oxybutynin Chloride Nausea And Vomiting   Codeine Nausea And Vomiting, Rash      Medication List    TAKE these medications   12 HOUR NASAL SPRAY 0.05 % nasal spray Generic drug:  oxymetazoline Place 2 sprays into the nose 2 (two) times daily as needed for congestion.   albuterol 108 (90 Base) MCG/ACT inhaler Commonly known as:  PROVENTIL HFA;VENTOLIN HFA Inhale 2 puffs into the lungs every 4 (four) hours as needed for wheezing or shortness of breath.   AMBULATORY NON FORMULARY MEDICATION Trach(Shiley 6.0  XLT UP uncuffed), Trach Care Kit, [place] holders, Disposable inner cannulas   betamethasone dipropionate 0.05 % lotion Apply topically 2 (two) times daily. To rash on leg   feeding supplement (JEVITY 1.2 CAL) Liqd Place 474 mLs into feeding tube every 6 (six) hours.    IBU 400 MG tablet Generic drug:  ibuprofen take 1 tablet by mouth three times a day if needed for pain What changed:  See the new instructions.   ipratropium-albuterol 0.5-2.5 (3) MG/3ML Soln Commonly known as:  DUONEB Take 3 mLs by nebulization every 6 (six) hours as needed. What changed:  reasons to take this   losartan 100 MG tablet Commonly known as:  COZAAR Place 1 tablet (100 mg total) into feeding tube daily.   naphazoline-pheniramine 0.025-0.3 % ophthalmic solution Commonly known as:  NAPHCON-A Place 2 drops into both eyes 4 (four) times daily as needed for eye irritation or allergies.   ranitidine 150 MG tablet Commonly known as:  ZANTAC Take 150 mg per tube two times a day   triamcinolone cream 0.1 % Commonly known as:  KENALOG Apply 1 application topically 2 (two) times daily. APPLY TO AFFECTED AREAS       Allergies  Allergen Reactions  . Oxybutynin Chloride Nausea And Vomiting  . Codeine Nausea And Vomiting and Rash    Consultations:  ENT.    Procedures/Studies: Dg Chest Portable 1 View  Result Date: 03/01/2017 CLINICAL DATA:  Status post complicated tracheostomy change EXAM: PORTABLE CHEST 1 VIEW COMPARISON:  12/29/2016 chest radiograph. FINDINGS: Tracheostomy tube tip overlies the tracheal air column at the thoracic inlet. Partially visualized surgical plate, screws and clips in the right lower neck. Stable cardiomediastinal silhouette with normal heart size and aortic atherosclerosis. No pneumothorax. No pleural effusion. No pulmonary edema. Mild bibasilar scarring versus atelectasis. No acute consolidative airspace disease. IMPRESSION: 1. Tracheostomy tube appears well-positioned. 2. Mild bibasilar scarring versus atelectasis. Electronically Signed   By: Ilona Sorrel M.D.   On: 03/01/2017 20:03       Subjective: Patient feeling better, dyspnea has improved, no chest pain, no nausea or vomiting.   Discharge Exam: Vitals:   03/02/17 1236 03/02/17  1251  BP: (!) 179/96   Pulse: 73   Resp: (!) 23   Temp: 98.4 F (36.9 C)   SpO2: 100% 100%   Vitals:   03/02/17 0756 03/02/17 1150 03/02/17 1236 03/02/17 1251  BP: (!) 155/83  (!) 179/96   Pulse: 70 77 73   Resp: (!) 22 20 (!) 23   Temp: 97.8 F (36.6 C)  98.4 F (36.9 C)   TempSrc: Oral  Oral   SpO2: 100% 100% 100% 100%  Weight:      Height:        General: Pt is alert, awake, not in acute distress ENT; trach in place.  Cardiovascular: RRR, S1/S2 +, no rubs, no gallops Respiratory: CTA bilaterally, no wheezing, no rhonchi Abdominal: Soft, NT, ND, bowel sounds +. PEG in place.  Extremities: no edema, no cyanosis    The results of significant diagnostics from this hospitalization (including imaging, microbiology, ancillary and laboratory) are listed below for reference.     Microbiology: Recent Results (from the past 240 hour(s))  MRSA PCR Screening     Status: None   Collection Time: 03/02/17  2:25 AM  Result Value Ref Range Status   MRSA by PCR NEGATIVE NEGATIVE Final    Comment:        The GeneXpert MRSA Assay (FDA approved  for NASAL specimens only), is one component of a comprehensive MRSA colonization surveillance program. It is not intended to diagnose MRSA infection nor to guide or monitor treatment for MRSA infections.      Labs: BNP (last 3 results) Recent Labs    04/08/16 1407  BNP 5.8   Basic Metabolic Panel: Recent Labs  Lab 03/01/17 1940 03/02/17 0510  NA 131* 132*  K 4.3 3.8  CL 94* 96*  CO2 23 25  GLUCOSE 175* 110*  BUN 18 14  CREATININE 1.00 0.78  CALCIUM 9.3 9.4   Liver Function Tests: Recent Labs  Lab 03/01/17 1940  AST 52*  ALT 31  ALKPHOS 132*  BILITOT 0.5  PROT 8.2*  ALBUMIN 4.0   No results for input(s): LIPASE, AMYLASE in the last 168 hours. No results for input(s): AMMONIA in the last 168 hours. CBC: Recent Labs  Lab 03/01/17 1940 03/02/17 0510  WBC 7.6 6.1  NEUTROABS 3.3  --   HGB 14.8 14.4  HCT  44.1 42.3  MCV 90.9 89.1  PLT 273 256   Cardiac Enzymes: No results for input(s): CKTOTAL, CKMB, CKMBINDEX, TROPONINI in the last 168 hours. BNP: Invalid input(s): POCBNP CBG: Recent Labs  Lab 03/01/17 2352 03/02/17 0402 03/02/17 0755 03/02/17 1231  GLUCAP 102* 164* 114* 87   D-Dimer No results for input(s): DDIMER in the last 72 hours. Hgb A1c No results for input(s): HGBA1C in the last 72 hours. Lipid Profile No results for input(s): CHOL, HDL, LDLCALC, TRIG, CHOLHDL, LDLDIRECT in the last 72 hours. Thyroid function studies No results for input(s): TSH, T4TOTAL, T3FREE, THYROIDAB in the last 72 hours.  Invalid input(s): FREET3 Anemia work up No results for input(s): VITAMINB12, FOLATE, FERRITIN, TIBC, IRON, RETICCTPCT in the last 72 hours. Urinalysis    Component Value Date/Time   COLORURINE YELLOW 09/14/2015 0910   APPEARANCEUR CLEAR 09/14/2015 0910   LABSPEC 1.013 09/14/2015 0910   PHURINE 7.5 09/14/2015 0910   GLUCOSEU NEGATIVE 09/14/2015 0910   GLUCOSEU NEGATIVE 10/29/2013 1118   HGBUR NEGATIVE 09/14/2015 0910   BILIRUBINUR NEGATIVE 09/14/2015 0910   BILIRUBINUR Negative 10/08/2014 0930   KETONESUR NEGATIVE 09/14/2015 0910   PROTEINUR NEGATIVE 09/14/2015 0910   UROBILINOGEN 0.2 10/08/2014 0930   UROBILINOGEN 0.2 10/29/2013 1118   NITRITE NEGATIVE 09/14/2015 0910   LEUKOCYTESUR NEGATIVE 09/14/2015 0910   Sepsis Labs Invalid input(s): PROCALCITONIN,  WBC,  LACTICIDVEN Microbiology Recent Results (from the past 240 hour(s))  MRSA PCR Screening     Status: None   Collection Time: 03/02/17  2:25 AM  Result Value Ref Range Status   MRSA by PCR NEGATIVE NEGATIVE Final    Comment:        The GeneXpert MRSA Assay (FDA approved for NASAL specimens only), is one component of a comprehensive MRSA colonization surveillance program. It is not intended to diagnose MRSA infection nor to guide or monitor treatment for MRSA infections.      Time coordinating  discharge: 45 minutes  SIGNED:   Tawni Millers, MD  [facility] 03/02/2017, 2:35 PM Pager [phone]  If 7PM-7AM, please contact night-coverage [email or web address] Password [email or web address]

## 2017-03-02 NOTE — Care Management Obs Status (Signed)
[facility] NOTIFICATION   Patient Details  Name: Nicholas Cobb MRN: [id number] Date of Birth: 1950/06/24   Medicare Observation Status Notification Given:  Yes    Zenon Mayo, RN 03/02/2017, 2:29 PM

## 2017-03-02 NOTE — Progress Notes (Signed)
Per pt, baseline is no O2 during day. weaned to 28%. Spoke w/ RT ~removing O2. Replaced trach is #6 shiley w/ cuff (deflated). Unsure if we are able to d/c pts w/ a cuffed trach despite being deflated.  Messaged attending MD

## 2017-03-02 NOTE — ED Notes (Signed)
Attempted to call report

## 2017-03-02 NOTE — ED Notes (Signed)
RT at bedside suctioning pt's tracheostomy.

## 2017-03-02 NOTE — Progress Notes (Signed)
RT in to assess pt for potential trach changed.  Pt noted to have severe facial swelling with a short neck.  Due to difficulty of trach change yesterday and dilation that was needed Dr. Erik Obey was notifed and he will let the on call physician know that this trach change is needed.

## 2017-03-02 NOTE — Progress Notes (Signed)
Pt requesting RN Production manager and speak w/ "Miss Hall Busing" to let her know he is in the hospital. Spoke w/ [employer], relayed pt is in hospital and assisted pt in rescheduling appt.

## 2017-03-02 NOTE — Progress Notes (Signed)
ED attempted to call report prior to approval of the pt by receiving department charge RN.  Report declined. Return call placed by receiving RN within 5 minutes.

## 2017-03-02 NOTE — Progress Notes (Signed)
Pt requesting that RN contact pts dtr to let her know he is in hospital. Pt states we may provide his dtr w/ information. Left vm for dtr Purcell Mouton @ [phone] (number provided by pt). Relayed that pt is in hospital on 13M and provided unit phone number.

## 2017-03-02 NOTE — Progress Notes (Signed)
Pt very agitated, wanting to go home/speak w/ MD. States was told by yesterday can d/c today. Messaged attending MD re:d/c plan for pt

## 2017-03-02 NOTE — Progress Notes (Signed)
PTS BP running high throughout day. Messaged attending MD for PRN med

## 2017-03-02 NOTE — ED Notes (Signed)
Responding to call bell. Pt alert, NAD, calm, interactive, resps e/u, non-verbal, writing on paper to communicate, no dyspnea noted, skin W&D, VSS, BP elevated, requesting trach assistance, RT paged to BS.

## 2017-03-02 NOTE — ED Notes (Signed)
Pharmacist notified to verify admission orders of pt.

## 2017-03-02 NOTE — Progress Notes (Signed)
Orders to replace trach w/ #6 shiley cuffless. Spoke w/ RT who states they are not comfortable replacing trach given that ENT had to place in ED. Messaged Dr. Cathlean Sauer to relay. Will contact ENT if needed

## 2017-03-02 NOTE — Progress Notes (Signed)
Patient seen for trach team follow up.  All needed equipment at the bedside.  No education needed at this time.  Patient with chronic trach.  Will follow as needed.

## 2017-03-03 ENCOUNTER — Other Ambulatory Visit: Payer: Self-pay

## 2017-03-03 DIAGNOSIS — J9610 Chronic respiratory failure, unspecified whether with hypoxia or hypercapnia: Secondary | ICD-10-CM | POA: Diagnosis not present

## 2017-03-03 DIAGNOSIS — Z930 Tracheostomy status: Secondary | ICD-10-CM | POA: Diagnosis not present

## 2017-03-03 DIAGNOSIS — Z8589 Personal history of malignant neoplasm of other organs and systems: Secondary | ICD-10-CM | POA: Diagnosis not present

## 2017-03-03 DIAGNOSIS — J9503 Malfunction of tracheostomy stoma: Secondary | ICD-10-CM | POA: Diagnosis not present

## 2017-03-03 DIAGNOSIS — I10 Essential (primary) hypertension: Secondary | ICD-10-CM | POA: Diagnosis not present

## 2017-03-03 DIAGNOSIS — J9509 Other tracheostomy complication: Secondary | ICD-10-CM | POA: Diagnosis not present

## 2017-03-03 LAB — GLUCOSE, CAPILLARY
Glucose-Capillary: 100 mg/dL — ABNORMAL HIGH (ref 65–99)
Glucose-Capillary: 121 mg/dL — ABNORMAL HIGH (ref 65–99)
Glucose-Capillary: 186 mg/dL — ABNORMAL HIGH (ref 65–99)
Glucose-Capillary: 97 mg/dL (ref 65–99)

## 2017-03-03 NOTE — Care Management Note (Signed)
Case Management Note  Patient Details  Name: Nicholas Cobb MRN: [id number] Date of Birth: [birth year]  Subjective/Objective:  From home, presents with possible obstructive pulmonary edema related to malfunctioning tracheostomy tube.  He is chronic trach and peg tube. He is for dc to home today.        Action/Plan: DC home today.  Expected Discharge Date:  03/03/17               Expected Discharge Plan:  Home/Self Care  In-House Referral:     Discharge planning Services  CM Consult  Post Acute Care Choice:    Choice offered to:     DME Arranged:    DME Agency:     HH Arranged:    HH Agency:     Status of Service:  Completed, signed off  If discussed at [employer] of Stay Meetings, dates discussed:    Additional Comments:  Zenon Mayo, RN 03/03/2017, 11:49 AM

## 2017-03-03 NOTE — Progress Notes (Signed)
Pt informed and agreed to discharge after being monitored for 3 hours after trach changed by ENT at 1000 from cuffed to cuffless trach. Pt maintained O2 saturation above 92 and did not have any SOB per order. Went over discharge instructions with pt and pt wrote that he did not have any questions and given discharge paperwork. Pt wheeled off unit by nurse tech with belongings, accompanied by friend Nevin Bloodgood who is accompanying pt home.

## 2017-03-03 NOTE — Progress Notes (Addendum)
PROGRESS NOTE    Nicholas Cobb  WUJ:[id number] DOB: [birth year] DOA: 03/01/2017 PCP: [name], NP    Brief Narrative:  67 y/o male presents to the ER with tracheostomy tubemalfunction, has medical history significant for head and neck cancer status post extensive surgery and radiation, trach and PEG dependent, diabetes mellitus type 2, COPD, hypertension. Patient reports feeling blockage in his trach tube, could not breathe,and removed the tubehimselfprior to ER. Patient was sedated for tracheostomy dilation, trach tube was difficult to be replaced by ER physician and consulted ENT, andpatient had significant desaturation in the process. Trach tube is replaced. Initial BP 116/71, pulse 69, resp 14, SpO2 99%. Was awake and alert, his lungs are clear to auscultation no rhonchi or rales, heart S1-S2 present and rhythmic, his abdomen had a PEG tube in place, no lower extremity edema. Sodium 131, potassium 4.3, chloride 94, bicarbonate 23, glucose 175, bicarbonate 18, creatinine 1.0, white cell count 7.6, hemoglobin 14.8, hematocrit 44.1, platelets 273. EKG sinus tachycardiac, normal axis, normal intervals, PACs with bigeminy. Chest x-ray mild bibasilar scarring versus atelectasis.   Patient admitted to the hospital working diagnosis of possible obstructive pulmonary edema related to malfunctioning tracheostomy tube   Assessment & Plan:   Principal Problem:   Tracheostomy malfunction ([place]) Active Problems:   Pulmonary emphysema ([place])   Essential hypertension   Respiratory failure ([place])   Chronic respiratory failure ([place])   1. Tracheostomy tube malfunctioning.SP tracheostomy tract dilatationwith a 5, 7, and 8 stainless steel tracheostomy tube. Now has a  #6 cuffed Shiley. I spoke with Dr Erik Obey yesterday and he advised to change to #6 cuff ess Shiley before discharge. Respiratory therapist prefers ENT to do the exchange. Currently waiting for trach change for discharge.      2. COPD. No sings of exacerbation, continue with bronchodilator therapy.  3. HTN. On losartan, continue as needed hydralazine. .   4, T2DM. Continue insulin sliding scale for glucose cover and monitoring. Capillary glucose 128, 136, 121, 100, 186.   5. Head and Neck cancer. SP trach and peg. Will follow with [facility] as outpatient.     DVT prophylaxis: scd  Code Status: full Family Communication: no family at the bedside Disposition Plan: home today   Consultants:   ENT  Procedures:     Antimicrobials:       Subjective: Patient feeling well, no dyspnea or chest pain, trach functioning well.   Objective: Vitals:   03/03/17 0110 03/03/17 0300 03/03/17 0332 03/03/17 0729  BP:    119/79  Pulse:  85 83 94  Resp:  (!) 24 (!) 22 (!) 30  Temp: (!) 100.8 F (38.2 C)  100 F (37.8 C) 99.5 F (37.5 C)  TempSrc: Oral  Axillary Oral  SpO2:  98% 95% 90%  Weight:      Height:        Intake/Output Summary (Last 24 hours) at 03/03/2017 9562 Last data filed at 03/03/2017 0045 Gross per 24 hour  Intake 794 ml  Output 875 ml  Net -81 ml   Filed Weights   03/02/17 0230  Weight: 69.5 kg (153 lb 3.5 oz)    Examination:   General: Not in pain or dyspnea, deconditioned Neurology: Awake and alert, non focal  E ENT: no pallor, no icterus, oral mucosa moist/ trach in place.  Cardiovascular: No JVD. S1-S2 present, rhythmic, no gallops, rubs, or murmurs. No lower extremity edema. Pulmonary: vesicular breath sounds bilaterally, adequate air movement, no wheezing, rhonchi or  rales. Gastrointestinal. Abdomen flat, no organomegaly, non tender, no rebound or guarding Skin. No rashes Musculoskeletal: no joint deformities     Data Reviewed: I have personally reviewed following labs and imaging studies  CBC: Recent Labs  Lab 03/01/17 1940 03/02/17 0510  WBC 7.6 6.1  NEUTROABS 3.3  --   HGB 14.8 14.4  HCT 44.1 42.3  MCV 90.9 89.1  PLT 273 222   Basic  Metabolic Panel: Recent Labs  Lab 03/01/17 1940 03/02/17 0510  NA 131* 132*  K 4.3 3.8  CL 94* 96*  CO2 23 25  GLUCOSE 175* 110*  BUN 18 14  CREATININE 1.00 0.78  CALCIUM 9.3 9.4   GFR: Estimated Creatinine Clearance: 85.7 mL/min (by C-G formula based on SCr of 0.78 mg/dL). Liver Function Tests: Recent Labs  Lab 03/01/17 1940  AST 52*  ALT 31  ALKPHOS 132*  BILITOT 0.5  PROT 8.2*  ALBUMIN 4.0   No results for input(s): LIPASE, AMYLASE in the last 168 hours. No results for input(s): AMMONIA in the last 168 hours. Coagulation Profile: Recent Labs  Lab 03/01/17 1940  INR 1.04   Cardiac Enzymes: No results for input(s): CKTOTAL, CKMB, CKMBINDEX, TROPONINI in the last 168 hours. BNP (last 3 results) No results for input(s): PROBNP in the last 8760 hours. HbA1C: No results for input(s): HGBA1C in the last 72 hours. CBG: Recent Labs  Lab 03/02/17 1659 03/02/17 2126 03/03/17 0029 03/03/17 0335 03/03/17 0720  GLUCAP 128* 136* 121* 100* 186*   Lipid Profile: No results for input(s): CHOL, HDL, LDLCALC, TRIG, CHOLHDL, LDLDIRECT in the last 72 hours. Thyroid Function Tests: No results for input(s): TSH, T4TOTAL, FREET4, T3FREE, THYROIDAB in the last 72 hours. Anemia Panel: No results for input(s): VITAMINB12, FOLATE, FERRITIN, TIBC, IRON, RETICCTPCT in the last 72 hours.    Radiology Studies: I have reviewed all of the imaging during this hospital visit personally     Scheduled Meds: . chlorhexidine  15 mL Mouth Rinse BID  . famotidine  20 mg Oral BID  . feeding supplement (JEVITY 1.2 CAL)  474 mL Per Tube Q6H  . insulin aspart  0-9 Units Subcutaneous Q4H  . losartan  100 mg Per Tube Daily  . mouth rinse  15 mL Mouth Rinse q12n4p  . triamcinolone cream  1 application Topical BID   Continuous Infusions:   LOS: 0 days        Morrigan Wickens Gerome Apley, MD [facility] Pager [phone]

## 2017-03-03 NOTE — Plan of Care (Signed)
Pt to be discharged home per order. Pt maintained O2 saturation above 92 after trach change from cuffed to cuffless trach. Pt educated and acknowledged understanding by writing down acknowledgement and that they had no further education needs or concerns at this time.

## 2017-03-03 NOTE — Progress Notes (Signed)
   Subjective:    Patient ID: Nicholas Cobb, male    DOB: 31-Oct-1950, 67 y.o.   MRN: [id number]  HPI Asked to change trach tube to cuffless tube.  History noted.  Patient known to me.  #6 cuffed Shiley tube in place.  Review of Systems     Objective:   Physical Exam Alert, NAD Breathing comfortably with #6 cuffed Shiley in place Marked lower facial edema, very stiff neck.  #6 cuffed trach tube removed and replace with #6 cuffless Shiley trach tube without difficulty, minor bleeding.  Breathing comfortably through new tube.     Assessment & Plan:  Trach dependence, history of head and neck cancer  Changed to cuffless trach tube.  Should be stable for discharge from airway standpoint.

## 2017-03-04 ENCOUNTER — Inpatient Hospital Stay (HOSPITAL_COMMUNITY)
Admission: EM | Admit: 2017-03-04 | Discharge: 2017-03-07 | DRG: 205 | Disposition: A | Discharge status: Home Health | Payer: Medicare Other | Attending: Family Medicine | Admitting: Family Medicine

## 2017-03-04 ENCOUNTER — Encounter (HOSPITAL_COMMUNITY): Payer: Self-pay | Admitting: Emergency Medicine

## 2017-03-04 ENCOUNTER — Emergency Department (HOSPITAL_COMMUNITY): Payer: Medicare Other

## 2017-03-04 DIAGNOSIS — Z930 Tracheostomy status: Secondary | ICD-10-CM

## 2017-03-04 DIAGNOSIS — Z833 Family history of diabetes mellitus: Secondary | ICD-10-CM

## 2017-03-04 DIAGNOSIS — J439 Emphysema, unspecified: Secondary | ICD-10-CM | POA: Diagnosis not present

## 2017-03-04 DIAGNOSIS — Z430 Encounter for attention to tracheostomy: Secondary | ICD-10-CM

## 2017-03-04 DIAGNOSIS — K219 Gastro-esophageal reflux disease without esophagitis: Secondary | ICD-10-CM | POA: Diagnosis present

## 2017-03-04 DIAGNOSIS — T17990A Other foreign object in respiratory tract, part unspecified in causing asphyxiation, initial encounter: Secondary | ICD-10-CM | POA: Diagnosis present

## 2017-03-04 DIAGNOSIS — Z931 Gastrostomy status: Secondary | ICD-10-CM

## 2017-03-04 DIAGNOSIS — J9621 Acute and chronic respiratory failure with hypoxia: Secondary | ICD-10-CM | POA: Diagnosis present

## 2017-03-04 DIAGNOSIS — C329 Malignant neoplasm of larynx, unspecified: Secondary | ICD-10-CM | POA: Diagnosis present

## 2017-03-04 DIAGNOSIS — E119 Type 2 diabetes mellitus without complications: Secondary | ICD-10-CM | POA: Diagnosis present

## 2017-03-04 DIAGNOSIS — M47812 Spondylosis without myelopathy or radiculopathy, cervical region: Secondary | ICD-10-CM | POA: Diagnosis not present

## 2017-03-04 DIAGNOSIS — R220 Localized swelling, mass and lump, head: Secondary | ICD-10-CM | POA: Diagnosis not present

## 2017-03-04 DIAGNOSIS — E118 Type 2 diabetes mellitus with unspecified complications: Secondary | ICD-10-CM | POA: Diagnosis present

## 2017-03-04 DIAGNOSIS — R0902 Hypoxemia: Secondary | ICD-10-CM

## 2017-03-04 DIAGNOSIS — E871 Hypo-osmolality and hyponatremia: Secondary | ICD-10-CM | POA: Diagnosis not present

## 2017-03-04 DIAGNOSIS — I10 Essential (primary) hypertension: Secondary | ICD-10-CM | POA: Diagnosis present

## 2017-03-04 DIAGNOSIS — H9190 Unspecified hearing loss, unspecified ear: Secondary | ICD-10-CM | POA: Diagnosis not present

## 2017-03-04 DIAGNOSIS — R0602 Shortness of breath: Secondary | ICD-10-CM | POA: Diagnosis not present

## 2017-03-04 DIAGNOSIS — Z87891 Personal history of nicotine dependence: Secondary | ICD-10-CM | POA: Diagnosis not present

## 2017-03-04 DIAGNOSIS — Z8249 Family history of ischemic heart disease and other diseases of the circulatory system: Secondary | ICD-10-CM | POA: Diagnosis not present

## 2017-03-04 DIAGNOSIS — Z923 Personal history of irradiation: Secondary | ICD-10-CM

## 2017-03-04 DIAGNOSIS — J9503 Malfunction of tracheostomy stoma: Principal | ICD-10-CM | POA: Diagnosis present

## 2017-03-04 DIAGNOSIS — R000 Tachycardia, unspecified: Secondary | ICD-10-CM | POA: Diagnosis not present

## 2017-03-04 DIAGNOSIS — Z885 Allergy status to narcotic agent status: Secondary | ICD-10-CM | POA: Diagnosis not present

## 2017-03-04 DIAGNOSIS — Z8521 Personal history of malignant neoplasm of larynx: Secondary | ICD-10-CM | POA: Diagnosis not present

## 2017-03-04 LAB — BASIC METABOLIC PANEL
Anion gap: 10 (ref 5–15)
BUN: 19 mg/dL (ref 6–20)
CHLORIDE: 95 mmol/L — AB (ref 101–111)
CO2: 26 mmol/L (ref 22–32)
CREATININE: 0.93 mg/dL (ref 0.61–1.24)
Calcium: 9.1 mg/dL (ref 8.9–10.3)
GFR calc Af Amer: >60 mL/min (ref >60)
GFR calc non Af Amer: >60 mL/min (ref >60)
GLUCOSE: 94 mg/dL (ref 65–99)
POTASSIUM: 4.5 mmol/L (ref 3.5–5.1)
SODIUM: 131 mmol/L — AB (ref 135–145)

## 2017-03-04 LAB — CBC WITH DIFFERENTIAL/PLATELET
Basophils Absolute: 0 10*3/uL (ref 0.0–0.1)
Basophils Relative: 0 %
EOS ABS: 0 10*3/uL (ref 0.0–0.7)
Eosinophils Relative: 0 %
HCT: 40.3 % (ref 39.0–52.0)
HEMOGLOBIN: 13.3 g/dL (ref 13.0–17.0)
LYMPHS ABS: 0.5 10*3/uL — AB (ref 0.7–4.0)
LYMPHS PCT: 7 %
MCH: 29.9 pg (ref 26.0–34.0)
MCHC: 33 g/dL (ref 30.0–36.0)
MCV: 90.6 fL (ref 78.0–100.0)
MONOS PCT: 20 %
Monocytes Absolute: 1.5 10*3/uL — ABNORMAL HIGH (ref 0.1–1.0)
NEUTROS PCT: 73 %
Neutro Abs: 5.7 10*3/uL (ref 1.7–7.7)
Platelets: 214 10*3/uL (ref 150–400)
RBC: 4.45 MIL/uL (ref 4.22–5.81)
RDW: 13.9 % (ref 11.5–15.5)
WBC: 7.7 10*3/uL (ref 4.0–10.5)

## 2017-03-04 MED ORDER — IPRATROPIUM-ALBUTEROL 0.5-2.5 (3) MG/3ML IN SOLN
3.0000 mL | Freq: Once | RESPIRATORY_TRACT | Status: AC
Start: 2017-03-04 — End: 2017-03-04
  Administered 2017-03-04: 3 mL via RESPIRATORY_TRACT
  Filled 2017-03-04: qty 3

## 2017-03-04 NOTE — Progress Notes (Signed)
Pt inner cannula was taken out and assess no occlusion noted. Lurline Idol was secure with trach tie. But I did note occulusion within the trach flange. Large bloody sticky thick mucous plug was taken out per RT. MD at bedside throughout. Pt is stable at this time with the [facility]. Pt wants the NRB over is nose ad well. No distress or complications noted.

## 2017-03-04 NOTE — ED Provider Notes (Signed)
[facility] EMERGENCY DEPARTMENT Provider Note   CSN: [id number] Arrival date & time: 03/04/17  1808     History   Chief Complaint Chief Complaint  Patient presents with  . Respiratory Distress    HPI Nicholas Cobb is a 67 y.o. male.   Shortness of Breath  This is a recurrent problem. The current episode started less than 1 hour ago. The problem has not changed since onset.Pertinent negatives include no fever, no sore throat, no ear pain, no cough, no hemoptysis, no chest pain, no vomiting, no abdominal pain, no rash and no leg swelling. Treatments tried: patient removed trach inner cannula. The treatment provided no relief. He has had prior ED visits. Associated medical issues comments: head/throat cancer.    Past Medical History:  Diagnosis Date  . Allergy   . Cancer ([place]) 1999   throat cancer  . Cervical spondylolysis 04/30/2011   severe  . Cervical spondylosis 03/25/2011  . Change in voice   . Chronic pain 03/25/2011  . Chronic sinusitis 04/27/2011  . Depression   . Difficulty urinating   . Emphysema 03/25/2011  . Essential hypertension, benign 12/03/2012  . GERD (gastroesophageal reflux disease) 03/25/2011  . Hearing loss   . Impaired glucose tolerance 04/27/2011  . 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 Rash   . Trouble swallowing   . Type II or unspecified type diabetes mellitus without mention of complication, uncontrolled 04/27/2011  . Ulcer   . UTI (lower urinary tract infection)   . Xerostomia 03/25/2011    Patient Active Problem List   Diagnosis Date Noted  . Tracheostomy malfunction ([place]) 03/01/2017  . Trachea displaced 12/29/2016  . Other tracheostomy complication ([place]) 53/61/4431  . Radionecrosis   . Tracheostomy care ([place])   . Hypoxemia   . Hemoptysis 01/26/2016  . Throat cancer ([place])   . Controlled diabetes mellitus type 2 with complications ([place])   . Chronic respiratory failure ([place]) 06/14/2015  . Respiratory failure ([place])  06/13/2015  . Tracheostomy dependence ([place])   . Cellulitis and abscess of head   . Cellulitis of face   . Tracheostomy status ([place])   . Acute sinus infection 10/29/2013  . Rash 10/29/2013  . Hyponatremia 12/03/2012  . Essential hypertension 12/03/2012  . Cervical spondylolysis 04/30/2011  . Chronic sinusitis 04/27/2011  . Diabetes 04/27/2011  . Erectile dysfunction 04/19/2011  . Preventative health care 03/25/2011  . Cervical spondylosis 03/25/2011  . Pulmonary emphysema ([place]) 03/25/2011  . GERD (gastroesophageal reflux disease) 03/25/2011  . Chronic pain 03/25/2011  . 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 Xerostomia 03/25/2011  . Tracheostomy in place [facility]) 02/04/2011  . Laryngeal cancer ([place]) 02/04/2011  . Encounter for PEG (percutaneous endoscopic gastrostomy) ([place]) 02/04/2011  . Attention to G-tube [facility]) 11/07/2010    Past Surgical History:  Procedure Laterality Date  . GASTROSTOMY TUBE PLACEMENT    . IR GENERIC HISTORICAL  04/03/2016   IR CM INJ ANY COLONIC TUBE W/FLUORO 04/03/2016 [name], [name]  . PEG TUBE PLACEMENT  11/1999  . TRACHEOSTOMY  11/1999  . TRACHEOSTOMY TUBE PLACEMENT  11/24/2016   Procedure: EMERGENT TRACHEOSTOMY CHANGE;  Surgeon: Jerrell Belfast, MD;  Location: [facility];  Service: ENT;;  . TRACHEOSTOMY TUBE PLACEMENT N/A 12/29/2016   Procedure: TRACHEOSTOMY;  Surgeon: Melissa Montane, MD;  Location: [facility];  Service: ENT;  Laterality: N/A;       Home Medications    Prior to Admission medications   Medication Sig Start Date End Date  Taking? Authorizing Provider  albuterol (PROVENTIL HFA;VENTOLIN HFA) 108 (90 Base) MCG/ACT inhaler Inhale 2 puffs into the lungs every 4 (four) hours as needed for wheezing or shortness of breath. 03/09/16   [name], NP  AMBULATORY NON FORMULARY MEDICATION Trach(Shiley 6.0 XLT UP uncuffed), Trach Care Kit, Trach holders, Disposable inner cannulas 07/08/15   Gildardo Cranker, DO  betamethasone  dipropionate 0.05 % lotion Apply topically 2 (two) times daily. To rash on leg 10/20/15   Gildardo Cranker, DO  IBU 400 MG tablet take 1 tablet by mouth three times a day if needed for pain Patient taking differently: take 400 mg tablet by mouth three times a day if needed for pain 01/17/17   [name], NP  ipratropium-albuterol (DUONEB) 0.5-2.5 (3) MG/3ML SOLN Take 3 mLs by nebulization every 6 (six) hours as needed. Patient taking differently: Take 3 mLs by nebulization every 6 (six) hours as needed (for wheezing or shortness of breath).  08/01/16   Robyn Haber, MD  losartan (COZAAR) 100 MG tablet Place 1 tablet (100 mg total) into feeding tube daily. 11/27/16   Cherene Altes, MD  naphazoline-pheniramine (NAPHCON-A) 0.025-0.3 % ophthalmic solution Place 2 drops into both eyes 4 (four) times daily as needed for eye irritation or allergies.     [provider]  Nutritional Supplements (FEEDING SUPPLEMENT, JEVITY 1.2 CAL,) LIQD Place 474 mLs into feeding tube every 6 (six) hours. 11/27/16   Cherene Altes, MD  oxymetazoline (12 HOUR NASAL SPRAY) 0.05 % nasal spray Place 2 sprays into the nose 2 (two) times daily as needed for congestion.     [provider]  ranitidine (ZANTAC) 150 MG tablet Take 150 mg per tube two times a day 11/27/16   Cherene Altes, MD  triamcinolone cream (KENALOG) 0.1 % Apply 1 application topically 2 (two) times daily. APPLY TO AFFECTED AREAS 11/27/16   Cherene Altes, MD    Family History Family History  Problem Relation Age of Onset  . Diabetes Mother   . Heart disease Father   . Stroke Other   . Diabetes Other   . Cancer Other        lung cancer    Social History Social History   Tobacco Use  . Smoking status: Former Smoker    Packs/day: 1.00    Years: 23.00    Pack years: 23.00    Last attempt to quit: 11/06/1996    Years since quitting: 20.3  . Smokeless tobacco: Never Used  Substance Use Topics  . Alcohol use:  No    Alcohol/week: 0.0 oz  . Drug use: No     Allergies   Oxybutynin chloride and Codeine   Review of Systems Review of Systems  Constitutional: Negative for chills and fever.  HENT: Negative for ear pain and sore throat.   Eyes: Negative for pain and visual disturbance.  Respiratory: Positive for shortness of breath. Negative for cough and hemoptysis.   Cardiovascular: Negative for chest pain, palpitations and leg swelling.  Gastrointestinal: Negative for abdominal pain and vomiting.  Genitourinary: Negative for dysuria and hematuria.  Musculoskeletal: Negative for arthralgias and back pain.  Skin: Negative for color change and rash.  Neurological: Negative for seizures and syncope.  All other systems reviewed and are negative.    Physical Exam Updated Vital Signs BP (!) 212/108   Pulse (!) 115   Temp (!) 96 F (35.6 C) (Temporal)   Resp (!) 24   SpO2 98%  Physical Exam  Constitutional: He is oriented to person, place, and time. He appears well-developed and well-nourished.  HENT:  Head: Normocephalic and atraumatic.  Post surgical changes. Face and neck swelling at baseline.  Eyes: Conjunctivae and EOM are normal. Pupils are equal, round, and reactive to light.  Neck: Neck supple.  Cardiovascular: Regular rhythm.  tachycardia  Pulmonary/Chest: Breath sounds normal. He is in respiratory distress.  Abdominal: Soft. There is no tenderness.  Musculoskeletal: He exhibits no edema.  Neurological: He is alert and oriented to person, place, and time.  Skin: Skin is warm and dry.  Psychiatric: He has a normal mood and affect.  Nursing note and vitals reviewed.    ED Treatments / Results  Labs (all labs ordered are listed, but only abnormal results are displayed) Labs Reviewed - No data to display  EKG  EKG Interpretation None       Radiology No results found.  Procedures TRACHEOSTOMY REPLACEMENT Date/Time: 03/04/2017 9:13 PM Performed by: Elveria Rising, MD Authorized by: Elnora Morrison, MD  Consent: Verbal consent obtained. Risks and benefits: risks, benefits and alternatives were discussed Patient identity confirmed: verbally with patient Indications: became dislodged Local anesthesia used: no  Anesthesia: Local anesthesia used: no  Sedation: Patient sedated: no  Tube cuff: cuffless Tube size: 6.0 mm Patient tolerance: Patient tolerated the procedure well with no immediate complications    (including critical care time)  Medications Ordered in ED Medications - No data to display   Initial Impression / Assessment and Plan / ED Course  I have reviewed the triage vital signs and the nursing notes.  Pertinent labs & imaging results that were available during my care of the patient were reviewed by me and considered in my medical decision making (see chart for details).     Mr. Vastine is a 67 year old male with a past medical history significant for neck and throat cancer status post tracheostomy, severe cervical spondylosis, diabetes who presents for shortness of breath.  Patient was discharged yesterday after having his trach replaced.  Reported oxygen saturations were 50% by EMS.  Saturations improved with aggressive oxygenation including trach collar and nonrebreather mask.  RT suctioned and retreived mucus and blood plugs.  Unable to replace inner cannula.  ENT consulted and recommended repeat attempts to place the inner cannula.  Upon returning to the room to attempt placing the inner cannula, patient was found with trach completely removed.  It was reinserted as described above.  Inner cannula was easily inserted.  Patient was again suctioned and more mucous plugs were recovered.  Patient continues to require 4 L by trach collar.  His home baseline is O2 as needed though he does not know his concentrators ability to deliver in liters per minute.  He states that since the trach was replaced, he has had a  have continued support whereas his prior trach he really required supplemental oxygen.  Patient is admitted to hospitalist.   Final Clinical Impressions(s) / ED Diagnoses   Final diagnoses:  Tracheostomy care [facility])  Hypoxia    ED Discharge Orders    None       Elveria Rising, MD 03/05/17 [id number]    Elnora Morrison, MD 03/06/17 [phone]

## 2017-03-04 NOTE — ED Triage Notes (Signed)
Pt here from home with c/o resp distress , pt had his trach replaced yesterday and states that he does not feel the same

## 2017-03-04 NOTE — Progress Notes (Signed)
Trouble passing the suction catheter through the trach flanged. Obstruction noted. Pt is in no distress, SATs are 100%

## 2017-03-04 NOTE — Discharge Instructions (Addendum)
Nicholas Cobb,  You were admitted because of trouble breathing and had your tracheostomy changed. Please follow-up with your PCP and pulmonology/trach clinic.

## 2017-03-04 NOTE — Progress Notes (Signed)
Called to assess pt's trach. Pt has a NRB on which was placed by EMS. Pt has increased WOB & BS are diminished. Pt wants to keep NRB on his face. ATC collar placed over trach at 98% Fi02. Pt's trach is totally occluded. Attempted to suction multiple times with saline. I finally was able to get the suction catheter down. Pt had thick tan sections. PT arrived with no inner cannula. Pt's WOB has improved & Sp02 is 98%.

## 2017-03-04 NOTE — ED Notes (Signed)
Pt resting now after trach suctioned , b/p down

## 2017-03-05 ENCOUNTER — Other Ambulatory Visit: Payer: Self-pay

## 2017-03-05 ENCOUNTER — Telehealth: Payer: Self-pay

## 2017-03-05 ENCOUNTER — Encounter (HOSPITAL_COMMUNITY): Payer: Self-pay | Admitting: Family Medicine

## 2017-03-05 DIAGNOSIS — I10 Essential (primary) hypertension: Secondary | ICD-10-CM | POA: Diagnosis not present

## 2017-03-05 DIAGNOSIS — J9503 Malfunction of tracheostomy stoma: Secondary | ICD-10-CM | POA: Diagnosis present

## 2017-03-05 DIAGNOSIS — T17990A Other foreign object in respiratory tract, part unspecified in causing asphyxiation, initial encounter: Secondary | ICD-10-CM | POA: Diagnosis present

## 2017-03-05 DIAGNOSIS — Z8249 Family history of ischemic heart disease and other diseases of the circulatory system: Secondary | ICD-10-CM | POA: Diagnosis not present

## 2017-03-05 DIAGNOSIS — Z923 Personal history of irradiation: Secondary | ICD-10-CM | POA: Diagnosis not present

## 2017-03-05 DIAGNOSIS — E871 Hypo-osmolality and hyponatremia: Secondary | ICD-10-CM | POA: Diagnosis not present

## 2017-03-05 DIAGNOSIS — Z430 Encounter for attention to tracheostomy: Secondary | ICD-10-CM

## 2017-03-05 DIAGNOSIS — H9190 Unspecified hearing loss, unspecified ear: Secondary | ICD-10-CM | POA: Diagnosis present

## 2017-03-05 DIAGNOSIS — K219 Gastro-esophageal reflux disease without esophagitis: Secondary | ICD-10-CM | POA: Diagnosis present

## 2017-03-05 DIAGNOSIS — Z87891 Personal history of nicotine dependence: Secondary | ICD-10-CM | POA: Diagnosis not present

## 2017-03-05 DIAGNOSIS — Z833 Family history of diabetes mellitus: Secondary | ICD-10-CM | POA: Diagnosis not present

## 2017-03-05 DIAGNOSIS — Z930 Tracheostomy status: Secondary | ICD-10-CM | POA: Diagnosis not present

## 2017-03-05 DIAGNOSIS — E119 Type 2 diabetes mellitus without complications: Secondary | ICD-10-CM | POA: Diagnosis present

## 2017-03-05 DIAGNOSIS — Z8521 Personal history of malignant neoplasm of larynx: Secondary | ICD-10-CM | POA: Diagnosis not present

## 2017-03-05 DIAGNOSIS — Z931 Gastrostomy status: Secondary | ICD-10-CM | POA: Diagnosis not present

## 2017-03-05 DIAGNOSIS — Z885 Allergy status to narcotic agent status: Secondary | ICD-10-CM | POA: Diagnosis not present

## 2017-03-05 DIAGNOSIS — J9621 Acute and chronic respiratory failure with hypoxia: Secondary | ICD-10-CM | POA: Diagnosis not present

## 2017-03-05 DIAGNOSIS — J439 Emphysema, unspecified: Secondary | ICD-10-CM | POA: Diagnosis not present

## 2017-03-05 DIAGNOSIS — C329 Malignant neoplasm of larynx, unspecified: Secondary | ICD-10-CM | POA: Diagnosis not present

## 2017-03-05 DIAGNOSIS — M47812 Spondylosis without myelopathy or radiculopathy, cervical region: Secondary | ICD-10-CM | POA: Diagnosis present

## 2017-03-05 DIAGNOSIS — R0602 Shortness of breath: Secondary | ICD-10-CM | POA: Diagnosis present

## 2017-03-05 DIAGNOSIS — R0902 Hypoxemia: Secondary | ICD-10-CM | POA: Diagnosis not present

## 2017-03-05 LAB — BASIC METABOLIC PANEL
ANION GAP: 10 (ref 5–15)
BUN: 17 mg/dL (ref 6–20)
CHLORIDE: 97 mmol/L — AB (ref 101–111)
CO2: 26 mmol/L (ref 22–32)
Calcium: 9.1 mg/dL (ref 8.9–10.3)
Creatinine, Ser: 0.93 mg/dL (ref 0.61–1.24)
GFR calc Af Amer: >60 mL/min (ref >60)
Glucose, Bld: 81 mg/dL (ref 65–99)
POTASSIUM: 4.7 mmol/L (ref 3.5–5.1)
SODIUM: 133 mmol/L — AB (ref 135–145)

## 2017-03-05 MED ORDER — LOSARTAN POTASSIUM 50 MG PO TABS
100.0000 mg | ORAL_TABLET | Freq: Every day | ORAL | Status: DC
  Administered 2017-03-05 – 2017-03-07 (×3): 100 mg
  Filled 2017-03-05 (×4): qty 2

## 2017-03-05 MED ORDER — SODIUM CHLORIDE 0.9% FLUSH
3.0000 mL | Freq: Two times a day (BID) | INTRAVENOUS | Status: DC
  Administered 2017-03-06 – 2017-03-07 (×3): 3 mL via INTRAVENOUS

## 2017-03-05 MED ORDER — ENOXAPARIN SODIUM 40 MG/0.4ML ~~LOC~~ SOLN
40.0000 mg | Freq: Every day | SUBCUTANEOUS | Status: DC
  Administered 2017-03-06 – 2017-03-07 (×2): 40 mg via SUBCUTANEOUS
  Filled 2017-03-05 (×3): qty 0.4

## 2017-03-05 MED ORDER — IBUPROFEN 400 MG PO TABS: 400.0000 mg | ORAL_TABLET | Freq: Four times a day (QID) | ORAL | Status: DC | PRN

## 2017-03-05 MED ORDER — BETAMETHASONE DIPROPIONATE 0.05 % EX LOTN
TOPICAL_LOTION | Freq: Two times a day (BID) | CUTANEOUS | Status: DC
Start: 2017-03-05 — End: 2017-03-05

## 2017-03-05 MED ORDER — JEVITY 1.2 CAL PO LIQD
474.0000 mL | Freq: Four times a day (QID) | ORAL | Status: DC
  Administered 2017-03-05 – 2017-03-06 (×5): 474 mL
  Filled 2017-03-05 (×10): qty 474

## 2017-03-05 MED ORDER — FENTANYL CITRATE (PF) 100 MCG/2ML IJ SOLN: 25.0000 ug | INTRAMUSCULAR | Status: DC | PRN

## 2017-03-05 MED ORDER — OXYMETAZOLINE HCL 0.05 % NA SOLN: 2.0000 | Freq: Two times a day (BID) | NASAL | Status: DC | PRN

## 2017-03-05 MED ORDER — FAMOTIDINE 20 MG PO TABS
10.0000 mg | ORAL_TABLET | Freq: Two times a day (BID) | ORAL | Status: DC
  Administered 2017-03-05 – 2017-03-07 (×5): 10 mg via ORAL
  Filled 2017-03-05 (×5): qty 1

## 2017-03-05 MED ORDER — SODIUM CHLORIDE 0.9 % IV SOLN
INTRAVENOUS | Status: AC
  Administered 2017-03-05 (×2): via INTRAVENOUS

## 2017-03-05 MED ORDER — ONDANSETRON HCL 4 MG/2ML IJ SOLN: 4.0000 mg | Freq: Four times a day (QID) | INTRAMUSCULAR | Status: DC | PRN

## 2017-03-05 MED ORDER — NAPHAZOLINE-PHENIRAMINE 0.025-0.3 % OP SOLN: 2.0000 [drp] | Freq: Four times a day (QID) | OPHTHALMIC | Status: DC | PRN

## 2017-03-05 MED ORDER — ACETAMINOPHEN 650 MG RE SUPP: 650.0000 mg | Freq: Four times a day (QID) | RECTAL | Status: DC | PRN

## 2017-03-05 MED ORDER — ALBUTEROL SULFATE (2.5 MG/3ML) 0.083% IN NEBU
2.5000 mg | INHALATION_SOLUTION | RESPIRATORY_TRACT | Status: DC | PRN
  Administered 2017-03-05 – 2017-03-06 (×2): 2.5 mg via RESPIRATORY_TRACT
  Filled 2017-03-05 (×2): qty 3

## 2017-03-05 MED ORDER — IBUPROFEN 400 MG PO TABS
400.0000 mg | ORAL_TABLET | Freq: Four times a day (QID) | ORAL | Status: DC | PRN
  Filled 2017-03-05: qty 1

## 2017-03-05 MED ORDER — IPRATROPIUM-ALBUTEROL 0.5-2.5 (3) MG/3ML IN SOLN
3.0000 mL | Freq: Three times a day (TID) | RESPIRATORY_TRACT | Status: DC
  Administered 2017-03-05 – 2017-03-07 (×8): 3 mL via RESPIRATORY_TRACT
  Filled 2017-03-05 (×8): qty 3

## 2017-03-05 MED ORDER — ONDANSETRON HCL 4 MG PO TABS: 4.0000 mg | ORAL_TABLET | Freq: Four times a day (QID) | ORAL | Status: DC | PRN

## 2017-03-05 MED ORDER — ACETAMINOPHEN 325 MG PO TABS: 650.0000 mg | ORAL_TABLET | Freq: Four times a day (QID) | ORAL | Status: DC | PRN

## 2017-03-05 NOTE — ED Notes (Signed)
Attempted report 

## 2017-03-05 NOTE — ED Notes (Signed)
Pt. was transferred into a hospital bed.

## 2017-03-05 NOTE — ED Notes (Signed)
Pt is asking to be asking to be discharged home , Admitting MD paged to come to talk with pt

## 2017-03-05 NOTE — H&P (Signed)
History and Physical    Nicholas Cobb VQQ:[id number] DOB: [birth year] DOA: 03/04/2017  PCP: [name], NP   Patient coming from: Home  Chief Complaint: Acute SOB   HPI: Nicholas Cobb is a 67 y.o. male with medical history significant for laryngeal cancer status post radiation, now dependent on trach and PEG, presenting to the emergency department for evaluation of acute shortness of breath.  Patient reports that he has been in his usual state since discharge from the hospital yesterday until developing acute shortness of breath tonight that he attributed to tracheostomy problem.  He called EMS and he was reported to have an O2 saturation of 50% on their arrival.  This improved to the 90s with nonrebreather.  Denies recent fevers or chills and denies chest pain or palpitations.  ED Course: Upon arrival to the ED, patient is found to be afebrile, saturating well on 15 L/min via nonrebreather, slightly tachycardic, and with stable blood pressure.  Chest x-ray is negative for acute cardiopulmonary disease and chemistry panel is notable for chronic stable hyponatremia.  CBC is unremarkable.  Inner trach cannula was removed and cleaned of mucus and blood clot.  He was suctioned through the trach.  He improved with this and heart rate normalized.  He continues to require 4 L/min of supplemental oxygen and frequent suctioning.  He will be observed in the stepdown unit for ongoing evaluation and management chronic respiratory failure with hypoxia, suspected secondary to occlusion of his tracheostomy by mucus and blood.  Review of Systems:  All other systems reviewed and apart from HPI, are negative.  Past Medical History:  Diagnosis Date  . Allergy   . Cancer ([place]) 1999   throat cancer  . Cervical spondylolysis 04/30/2011   severe  . Cervical spondylosis 03/25/2011  . Change in voice   . Chronic pain 03/25/2011  . Chronic sinusitis 04/27/2011  . Depression   . Difficulty urinating   .  Emphysema 03/25/2011  . Essential hypertension, benign 12/03/2012  . GERD (gastroesophageal reflux disease) 03/25/2011  . Hearing loss   . Impaired glucose tolerance 04/27/2011  . 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 Rash   . Trouble swallowing   . Type II or unspecified type diabetes mellitus without mention of complication, uncontrolled 04/27/2011  . Ulcer   . UTI (lower urinary tract infection)   . Xerostomia 03/25/2011    Past Surgical History:  Procedure Laterality Date  . GASTROSTOMY TUBE PLACEMENT    . IR GENERIC HISTORICAL  04/03/2016   IR CM INJ ANY COLONIC TUBE W/FLUORO 04/03/2016 [name], [name]  . PEG TUBE PLACEMENT  11/1999  . TRACHEOSTOMY  11/1999  . TRACHEOSTOMY TUBE PLACEMENT  11/24/2016   Procedure: EMERGENT TRACHEOSTOMY CHANGE;  Surgeon: Jerrell Belfast, MD;  Location: [facility];  Service: ENT;;  . TRACHEOSTOMY TUBE PLACEMENT N/A 12/29/2016   Procedure: TRACHEOSTOMY;  Surgeon: Melissa Montane, MD;  Location: [facility];  Service: ENT;  Laterality: N/A;     reports that he quit smoking about 20 years ago. He has a 23.00 pack-year smoking history. he has never used smokeless tobacco. He reports that he does not drink alcohol or use drugs.  Allergies  Allergen Reactions  . Oxybutynin Chloride Nausea And Vomiting  . Codeine Nausea And Vomiting and Rash    Family History  Problem Relation Age of Onset  . Diabetes Mother   . Heart disease Father   . Stroke Other   . Diabetes Other   .  Cancer Other        lung cancer     Prior to Admission medications   Medication Sig Start Date End Date Taking? Authorizing Provider  AMBULATORY NON FORMULARY MEDICATION Trach(Shiley 6.0 XLT UP uncuffed), [place], [place] holders, Disposable inner cannulas 07/08/15  Yes Gildardo Cranker, DO  albuterol (PROVENTIL HFA;VENTOLIN HFA) 108 (90 Base) MCG/ACT inhaler Inhale 2 puffs into the lungs every 4 (four) hours as needed for wheezing or shortness of breath. 03/09/16   [name], NP  betamethasone dipropionate 0.05 % lotion Apply topically 2 (two) times daily. To rash on leg 10/20/15   Gildardo Cranker, DO  IBU 400 MG tablet take 1 tablet by mouth three times a day if needed for pain Patient taking differently: Take 400 mg by mouth three times a day as needed for pain 01/17/17   [name], NP  ipratropium-albuterol (DUONEB) 0.5-2.5 (3) MG/3ML SOLN Take 3 mLs by nebulization every 6 (six) hours as needed. Patient taking differently: Take 3 mLs by nebulization every 6 (six) hours as needed (for wheezing or shortness of breath).  08/01/16   Robyn Haber, MD  losartan (COZAAR) 100 MG tablet Place 1 tablet (100 mg total) into feeding tube daily. 11/27/16   Cherene Altes, MD  naphazoline-pheniramine (NAPHCON-A) 0.025-0.3 % ophthalmic solution Place 2 drops into both eyes 4 (four) times daily as needed for eye irritation or allergies.     [provider]  Nutritional Supplements (FEEDING SUPPLEMENT, JEVITY 1.2 CAL,) LIQD Place 474 mLs into feeding tube every 6 (six) hours. 11/27/16   Cherene Altes, MD  oxymetazoline (12 HOUR NASAL SPRAY) 0.05 % nasal spray Place 2 sprays into the nose 2 (two) times daily as needed for congestion.     [provider]  ranitidine (ZANTAC) 150 MG tablet Take 150 mg per tube two times a day 11/27/16   Cherene Altes, MD  triamcinolone cream (KENALOG) 0.1 % Apply 1 application topically 2 (two) times daily. APPLY TO AFFECTED AREAS 11/27/16   Cherene Altes, MD    Physical Exam: Vitals:   03/04/17 2205 03/04/17 2215 03/04/17 2216 03/04/17 2245  BP:  (!) 119/91  108/65  Pulse: 82 (!) 59  79  Resp: '16 18  18  '$ Temp:      TempSrc:      SpO2: 100% (!) 67% 99% 97%      Constitutional: Not in acute distress, no pallor, no diaphoresis Eyes: PERTLA, lids and conjunctivae normal ENMT: Mucous membranes are moist. Facial swelling.   Respiratory: Diminished breath sounds bilaterally. Normal  respiratory effort.   Cardiovascular: S1 & S2 heard, regular rate and rhythm. No significant murmurs / rubs / gallops.  Abdomen: No distension, no tenderness, no masses palpated. Bowel sounds active.  Musculoskeletal: no clubbing / cyanosis. No joint deformity upper and lower extremities.    Skin: no significant rashes, lesions, ulcers. Warm, dry, well-perfused. Neurologic: PERRL. EOMI. Sensation intact. Moving all extremities.  Psychiatric:  Alert and oriented x 3. Calm, cooperative.     Labs on Admission: I have personally reviewed following labs and imaging studies  CBC: Recent Labs  Lab 03/01/17 1940 03/02/17 0510 03/04/17 2224  WBC 7.6 6.1 7.7  NEUTROABS 3.3  --  5.7  HGB 14.8 14.4 13.3  HCT 44.1 42.3 40.3  MCV 90.9 89.1 90.6  PLT 273 256 431   Basic Metabolic Panel: Recent Labs  Lab 03/01/17 1940 03/02/17 0510 03/04/17 2224  NA 131*  132* 131*  K 4.3 3.8 4.5  CL 94* 96* 95*  CO2 '23 25 26  '$ GLUCOSE 175* 110* 94  BUN '18 14 19  '$ CREATININE 1.00 0.78 0.93  CALCIUM 9.3 9.4 9.1   GFR: Estimated Creatinine Clearance: 73.7 mL/min (by C-G formula based on SCr of 0.93 mg/dL). Liver Function Tests: Recent Labs  Lab 03/01/17 1940  AST 52*  ALT 31  ALKPHOS 132*  BILITOT 0.5  PROT 8.2*  ALBUMIN 4.0   No results for input(s): LIPASE, AMYLASE in the last 168 hours. No results for input(s): AMMONIA in the last 168 hours. Coagulation Profile: Recent Labs  Lab 03/01/17 1940  INR 1.04   Cardiac Enzymes: No results for input(s): CKTOTAL, CKMB, CKMBINDEX, TROPONINI in the last 168 hours. BNP (last 3 results) No results for input(s): PROBNP in the last 8760 hours. HbA1C: No results for input(s): HGBA1C in the last 72 hours. CBG: Recent Labs  Lab 03/02/17 2126 03/03/17 0029 03/03/17 0335 03/03/17 0720 03/03/17 1145  GLUCAP 136* 121* 100* 186* 97   Lipid Profile: No results for input(s): CHOL, HDL, LDLCALC, TRIG, CHOLHDL, LDLDIRECT in the last 72  hours. Thyroid Function Tests: No results for input(s): TSH, T4TOTAL, FREET4, T3FREE, THYROIDAB in the last 72 hours. Anemia Panel: No results for input(s): VITAMINB12, FOLATE, FERRITIN, TIBC, IRON, RETICCTPCT in the last 72 hours. Urine analysis:    Component Value Date/Time   COLORURINE YELLOW 09/14/2015 0910   APPEARANCEUR CLEAR 09/14/2015 0910   LABSPEC 1.013 09/14/2015 0910   PHURINE 7.5 09/14/2015 0910   GLUCOSEU NEGATIVE 09/14/2015 0910   GLUCOSEU NEGATIVE 10/29/2013 1118   HGBUR NEGATIVE 09/14/2015 0910   BILIRUBINUR NEGATIVE 09/14/2015 0910   BILIRUBINUR Negative 10/08/2014 0930   KETONESUR NEGATIVE 09/14/2015 0910   PROTEINUR NEGATIVE 09/14/2015 0910   UROBILINOGEN 0.2 10/08/2014 0930   UROBILINOGEN 0.2 10/29/2013 1118   NITRITE NEGATIVE 09/14/2015 0910   LEUKOCYTESUR NEGATIVE 09/14/2015 0910   Sepsis Labs: '@LABRCNTIP'$ (procalcitonin:4,lacticidven:4) ) Recent Results (from the past 240 hour(s))  MRSA PCR Screening     Status: None   Collection Time: 03/02/17  2:25 AM  Result Value Ref Range Status   MRSA by PCR NEGATIVE NEGATIVE Final    Comment:        The GeneXpert MRSA Assay (FDA approved for NASAL specimens only), is one component of a comprehensive MRSA colonization surveillance program. It is not intended to diagnose MRSA infection nor to guide or monitor treatment for MRSA infections.      Radiological Exams on Admission: Dg Chest Portable 1 View  Result Date: 03/04/2017 CLINICAL DATA:  Respiratory distress. Tracheostomy placed yesterday. Shortness of breath. EXAM: PORTABLE CHEST 1 VIEW COMPARISON:  03/01/2017 FINDINGS: Shallow inspiration. Normal heart size and pulmonary vascularity. Lungs are clear. No blunting of costophrenic angles. No pneumothorax. Tracheostomy tube is present. IMPRESSION: Shallow inspiration.  No evidence of active pulmonary disease. Electronically Signed   By: Lucienne Capers M.D.   On: 03/04/2017 21:48    EKG: Not performed.    Assessment/Plan  1. Acute on chronic hypoxic respiratory failure; COPD; tracheostomy dependence  - Presents with acute SOB similar to prior experiences with trach occlusion  - EMS reported sat 50%, improved with supplemental O2  - Mucus and blood plugs suctioned by RT  - Acute dyspnea much improved after inner canula replaced, but still requiring frequent suctioning and 4 Lpm supplemental O2, where as he only uses 2 Lpm as needed at home  - CXR negative for acute findings, no  fever or leukocytosis  - Plan to continue nebs, suctioning, supplemental O2    2. Laryngeal cancer - He is status-post remote treatment with radiation  - [facility]    3. Hyponatremia  - Serum sodium is 131 on admission - This appears to be chronic and stable    4. Hypertension  - BP at goal, continue losartan     DVT prophylaxis: Lovenox Code Status: Full  Family Communication: Discussed with patient Disposition Plan: Observe in SDU Consults called: ENT Admission status: Observation     Vianne Bulls, MD [facility] Pager [phone]  If 7PM-7AM, please contact night-coverage [email or web address] Password [facility]  03/05/2017, 12:27 AM

## 2017-03-05 NOTE — ED Notes (Signed)
MD at bedside. 

## 2017-03-05 NOTE — Progress Notes (Signed)
PROGRESS NOTE    Nicholas Cobb  YKD:[id number] DOB: 06/06/1950 DOA: 03/04/2017 PCP: [name], NP    Brief Narrative:  67 year old male who presents with hypoxic respiratory failure. 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   He was recently discharge 01/19 after an apparent trach malfunctioning, at that point his trach was changed to a Shiley #6 cuff less, originally he had a XL Shiley #6. Procedure performed by ENT. He was discharged in stable condition but at home he developed significant dyspnea, due to trach secretions, EMS reported oxygen saturation of 50% on arrival, which improved to 90% with a nonrebreather. On his initial physical examination blood pressure 119/91, heart rate 59, respiratory 18, oxygen saturation 97% on supplemental oxygen per trach collar. He had diminished breath sounds bilaterally, his trach was in place, heart S1-S2 present and rhythmic, abdomen soft nontender, no lower extremity edema. Sodium 131, potassium 4.5, chloride 95, bicarbonate 26, glucose 94, BUN 19, creatinine 0.9, white count 7.7, hemoglobin 13.3, platelets 214. Chest x-ray was clear for infiltrates, trach in place.  Patient was admitted to the hospital with working diagnosis of recurrent hypoxic respiratory failure, due to suspected trach secretions/ obstruction.   Assessment & Plan:   Principal Problem:   Acute on chronic respiratory failure with hypoxia (HCC) Active Problems:   Laryngeal cancer (HCC)   Pulmonary emphysema (HCC)   Hyponatremia   Essential hypertension   Tracheostomy dependence ([place])   Controlled diabetes mellitus type 2 with complications ([place])   1. Very severe  radio-osteonecrosis resulting in marked mandibular deformity and chronic lymphedema tracheostomy dependent. Patient had a recent hospitalization discharged 12/19, at that point his trach was changed to cuff-less #6 Shiley, by ENT, after he pulled the trach he had at home due to dyspnea. Patient tolerated procedure well and was discharged home. He presents with recurrent acute hypoxic respiratory failure due to plugging trach. His inner cannula has been cleaned of mucus and blood clot. Currently requiring suction per respiratory, continue oxymetry monitoring and supplemental 02 per trach collar. Apparently patient had a long trach XL#6 before due to obstructing tissue. Per old records, personally reviewed plan was to place a size 6 [place] disposable cuffed version. I consulted Pulmonary for further evaluation, and possible trach change.   2. COPD. No signs of exacerbation, will continue supplemental 02 per trach collar and oxymetry monitoring, as needed bronchodilator therapy.  3. HTN. Continue losartan.  4, T2DM. Will continue glucose cover and monitoring with insulin sliding scale.    DVT prophylaxis: enoxaparin  Code Status: full Family Communication: No family at bedside Disposition Plan: Home   Consultants:   Pulmonary for trach evaluation   Procedures:     Antimicrobials:       Subjective: Patient with dyspnea and secretions, improved with local suctioning, no nausea or vomiting, no chest pain.   Objective: Vitals:   03/05/17 1100 03/05/17 1106 03/05/17 1200 03/05/17 1300  BP: 122/73 122/73 132/71 (!) 155/96  Pulse: 65 68 68 82  Resp: 14 14 14  (!) 27  Temp:      TempSrc:      SpO2: 99% 99% 99% 100%    Intake/Output Summary (Last 24 hours) at 03/05/2017 1315 Last data filed at 03/05/2017 0313 Gross per 24 hour  Intake -  Output 400 ml  Net -400 ml   There were no vitals filed for this visit.  Examination:   General: Not in pain or dyspnea,  deconditioned Neurology: Awake and alert, non focal  E ENT: no pallor, no icterus, oral mucosa moist/ trach in place. Facial edema.  Cardiovascular: No JVD. S1-S2 present, rhythmic, no gallops, rubs, or murmurs. No lower extremity edema. Pulmonary:  breath sounds bilaterally, adequate air movement, no wheezing, positive proximal airway rhonchi. No rales. Gastrointestinal. Abdomen flat, no organomegaly, non tender, no rebound or guarding. PEG tube in place.  Skin. No rashes Musculoskeletal: no joint deformities     Data Reviewed: I have personally reviewed following labs and imaging studies  CBC: Recent Labs  Lab 03/01/17 1940 03/02/17 0510 03/04/17 2224  WBC 7.6 6.1 7.7  NEUTROABS 3.3  --  5.7  HGB 14.8 14.4 13.3  HCT 44.1 42.3 40.3  MCV 90.9 89.1 90.6  PLT 273 256 932   Basic Metabolic Panel: Recent Labs  Lab 03/01/17 1940 03/02/17 0510 03/04/17 2224 03/05/17 0418  NA 131* 132* 131* 133*  K 4.3 3.8 4.5 4.7  CL 94* 96* 95* 97*  CO2 23 25 26 26   GLUCOSE 175* 110* 94 81  BUN 18 14 19 17   CREATININE 1.00 0.78 0.93 0.93  CALCIUM 9.3 9.4 9.1 9.1   GFR: Estimated Creatinine Clearance: 73.7 mL/min (by C-G formula based on SCr of 0.93 mg/dL). Liver Function Tests: Recent Labs  Lab 03/01/17 1940  AST 52*  ALT 31  ALKPHOS 132*  BILITOT 0.5  PROT 8.2*  ALBUMIN 4.0   No results for input(s): LIPASE, AMYLASE in the last 168 hours. No results for input(s): AMMONIA in the last 168 hours. Coagulation Profile: Recent Labs  Lab 03/01/17 1940  INR 1.04   Cardiac Enzymes: No results for input(s): CKTOTAL, CKMB, CKMBINDEX, TROPONINI in the last 168 hours. BNP (last 3 results) No results for input(s): PROBNP in the last 8760 hours. HbA1C: No results for input(s): HGBA1C in the last 72 hours. CBG: Recent Labs  Lab 03/02/17 2126 03/03/17 0029 03/03/17 0335 03/03/17 0720 03/03/17 1145  GLUCAP 136* 121* 100* 186* 97   Lipid Profile: No results for input(s):  CHOL, HDL, LDLCALC, TRIG, CHOLHDL, LDLDIRECT in the last 72 hours. Thyroid Function Tests: No results for input(s): TSH, T4TOTAL, FREET4, T3FREE, THYROIDAB in the last 72 hours. Anemia Panel: No results for input(s): VITAMINB12, FOLATE, FERRITIN, TIBC, IRON, RETICCTPCT in the last 72 hours.    Radiology Studies: I have reviewed all of the imaging during this hospital visit personally     Scheduled Meds: . enoxaparin (LOVENOX) injection  40 mg Subcutaneous Daily  . famotidine  10 mg Oral BID  . feeding supplement (JEVITY 1.2 CAL)  474 mL Per Tube Q6H  . ipratropium-albuterol  3 mL Nebulization TID  . losartan  100 mg Per Tube Daily  . sodium chloride flush  3 mL Intravenous Q12H   Continuous Infusions:   LOS: 0 days        Camerin Jimenez Gerome Apley, MD [facility] Pager [phone]

## 2017-03-05 NOTE — Progress Notes (Signed)
Attempted report 

## 2017-03-05 NOTE — ED Notes (Signed)
Bedside report given to [place], Therapist, sports.

## 2017-03-05 NOTE — ED Notes (Signed)
Administered pt tube feeding, pt would not let me touch his tube at all during feeding but I used the syringe to administer while he handled the tube. The the patient dropped the tube at end of flushing it and small amount of water leaked out to which the patient wrote "what is wrong with you you got my bed wet." pt linens changed.

## 2017-03-05 NOTE — Telephone Encounter (Signed)
Called to make f/u appointment with PCP, was informed that pt was back in the hospital

## 2017-03-06 DIAGNOSIS — Z930 Tracheostomy status: Secondary | ICD-10-CM

## 2017-03-06 DIAGNOSIS — R0902 Hypoxemia: Secondary | ICD-10-CM

## 2017-03-06 DIAGNOSIS — J9621 Acute and chronic respiratory failure with hypoxia: Secondary | ICD-10-CM

## 2017-03-06 LAB — GLUCOSE, CAPILLARY
Glucose-Capillary: 128 mg/dL — ABNORMAL HIGH (ref 65–99)
Glucose-Capillary: 102 mg/dL — ABNORMAL HIGH (ref 65–99)

## 2017-03-06 MED ORDER — JEVITY 1.2 CAL PO LIQD: 1000.0000 mL | ORAL | Status: DC

## 2017-03-06 MED ORDER — JEVITY 1.2 CAL PO LIQD
474.0000 mL | Freq: Four times a day (QID) | ORAL | Status: DC
  Administered 2017-03-06 – 2017-03-07 (×4): 474 mL
  Filled 2017-03-06 (×9): qty 474

## 2017-03-06 NOTE — Progress Notes (Signed)
PROGRESS NOTE    Nicholas Cobb  WVP:[id number] DOB: 09-Oct-1950 DOA: 03/04/2017 PCP: [name], NP    Brief Narrative:  67 year old male who presents with hypoxic respiratory failure. 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   He was recently discharge 01/19 after an apparent trach malfunctioning, at that point his trach was changed to a Shiley #6 cuff less, originally he had a XL Shiley #6. Procedure performed by ENT. He was discharged in stable condition but at home he developed significant dyspnea, due to trach secretions, EMS reported oxygen saturation of 50% on arrival, which improved to 90% with a nonrebreather. On his initial physical examination blood pressure 119/91, heart rate 59, respiratory 18, oxygen saturation 97% on supplemental oxygen per trach collar. He had diminished breath sounds bilaterally, his trach was in place, heart S1-S2 present and rhythmic, abdomen soft nontender, no lower extremity edema. Sodium 131, potassium 4.5, chloride 95, bicarbonate 26, glucose 94, BUN 19, creatinine 0.9, white count 7.7, hemoglobin 13.3, platelets 214. Chest x-ray was clear for infiltrates, trach in place.  Patient was admitted to the hospital with working diagnosis of recurrent hypoxic respiratory failure, due to suspected trach secretions/ obstruction.   Assessment & Plan:   Principal Problem:   Acute on chronic respiratory failure with hypoxia (HCC) Active Problems:   Laryngeal cancer (HCC)   Pulmonary emphysema (HCC)   Hyponatremia   Essential hypertension   Tracheostomy dependence ([place])   Controlled diabetes mellitus type 2 with complications ([place])   1. Very severe  radio-osteonecrosis resulting in marked mandibular deformity and chronic lymphedema tracheostomy dependent. Pulmonary has been contacted and trach has been changed to #6 Shiley long to bypass soft tissue with potential to obstruct trach. Will continue trach collar and bronchodilator therapy. Considering recent readmission, will be prudent to observe overnight and discharge in am.    2. COPD. No clinical signs of exacerbation, on supplemental 02 per trach collar, as needed bronchodilator therapy. Oxymetry 95% on 40% Fi02.   3. HTN. On losartan for blood pressure control.  4, T2DM. Glucose cover and monitoring with insulin sliding scale. Capillary glucose 136, 121, 100, 186, 97.    DVT prophylaxis: enoxaparin  Code Status: full Family Communication: No family at bedside Disposition Plan: Home   Consultants:   Pulmonary for trach evaluation   Procedures:     Antimicrobials:       Subjective: Patient this am has been tolerating well trach collar, no dyspnea, nausea or vomiting.   Objective: Vitals:   03/06/17 0800 03/06/17 0808 03/06/17 0902 03/06/17 0908  BP:  137/86    Pulse: 69  86   Resp: (!) 27  (!) 24   Temp:  99.6 F (37.6 C)    TempSrc:  Oral    SpO2: 96%  100% (!) 84%  Weight:      Height:        Intake/Output Summary (Last 24 hours) at 03/06/2017 1146 Last data filed at 03/06/2017 0900 Gross per 24 hour  Intake -  Output 525 ml  Net -525 ml   Filed Weights   03/05/17 2131  Weight: 67.2 kg (148 lb 2.4 oz)    Examination:   General: Not in pain or dyspnea. deconditioned Neurology: Awake and alert, non focal  E ENT: no pallor, no icterus, oral mucosa moist. Trach in place/ Facial edema.  Cardiovascular: No JVD. S1-S2 present, rhythmic, no gallops, rubs, or murmurs. No lower extremity edema. Pulmonary: vesicular breath sounds bilaterally, adequate air movement, no wheezing, rhonchi or rales. Gastrointestinal. Abdomen flat, no organomegaly, non  tender, no rebound or guarding. Peg tube in place.  Skin. No rashes Musculoskeletal: no joint deformities     Data Reviewed: I have personally reviewed following labs and imaging studies  CBC: Recent Labs  Lab 03/01/17 1940 03/02/17 0510 03/04/17 2224  WBC 7.6 6.1 7.7  NEUTROABS 3.3  --  5.7  HGB 14.8 14.4 13.3  HCT 44.1 42.3 40.3  MCV 90.9 89.1 90.6  PLT 273 256 627   Basic Metabolic Panel: Recent Labs  Lab 03/01/17 1940 03/02/17 0510 03/04/17 2224 03/05/17 0418  NA 131* 132* 131* 133*  K 4.3 3.8 4.5 4.7  CL 94* 96* 95* 97*  CO2 23 25 26 26   GLUCOSE 175* 110* 94 81  BUN 18 14 19 17   CREATININE 1.00 0.78 0.93 0.93  CALCIUM 9.3 9.4 9.1 9.1   GFR: Estimated Creatinine Clearance: 71.3 mL/min (by C-G formula based on SCr of 0.93 mg/dL). Liver Function Tests: Recent Labs  Lab 03/01/17 1940  AST 52*  ALT 31  ALKPHOS 132*  BILITOT 0.5  PROT 8.2*  ALBUMIN 4.0   No results for input(s): LIPASE, AMYLASE in the last 168 hours. No results for input(s): AMMONIA in the last 168 hours. Coagulation Profile: Recent Labs  Lab 03/01/17 1940  INR 1.04   Cardiac Enzymes: No results for input(s): CKTOTAL, CKMB, CKMBINDEX, TROPONINI in the last 168 hours. BNP (last 3 results) No results for input(s): PROBNP in the last 8760 hours. HbA1C: No results for input(s): HGBA1C in the last 72 hours. CBG: Recent Labs  Lab 03/02/17 2126 03/03/17 0029 03/03/17 0335 03/03/17 0720 03/03/17 1145  GLUCAP 136* 121* 100* 186* 97   Lipid Profile: No results for input(s): CHOL, HDL, LDLCALC, TRIG, CHOLHDL, LDLDIRECT in the last 72 hours. Thyroid Function Tests: No results for input(s): TSH, T4TOTAL, FREET4, T3FREE, THYROIDAB in the last 72 hours. Anemia Panel: No results for input(s): VITAMINB12, FOLATE, FERRITIN, TIBC, IRON, RETICCTPCT in the last 72 hours.    Radiology Studies: I have reviewed all of the imaging during this hospital visit personally     Scheduled  Meds: . enoxaparin (LOVENOX) injection  40 mg Subcutaneous Daily  . famotidine  10 mg Oral BID  . feeding supplement (JEVITY 1.2 CAL)  474 mL Per Tube Q6H  . ipratropium-albuterol  3 mL Nebulization TID  . losartan  100 mg Per Tube Daily  . sodium chloride flush  3 mL Intravenous Q12H   Continuous Infusions:   LOS: 1 day        Alexes Lamarque Gerome Apley, MD [facility] Pager [phone]

## 2017-03-06 NOTE — Procedures (Signed)
The old size 6 Shiley was removed Using a bronchoscope a proximal XLT size 6 trach was loaded, stoma easily entered, and using the bronchoscope as a visual guide in effort to reduce trauma the tracheostomy was advanced without difficulty into place.  Placement was verified bronchoscopically, tracheostomy ties affixed to the patient.  Patient tolerated well  [name] ACNP-BC [place] Pager # [phone] OR # [phone] if no answer

## 2017-03-06 NOTE — Progress Notes (Signed)
  Assisted with trach change using the bronchoscope.  A 6.0 uncuffed shiley removed, and a 6.0 XLT proximal uncuffed reinserted without difficulty.  Placement verified with bronchoscope.  No complications noted.  Will continue to monitor.

## 2017-03-06 NOTE — Consult Note (Signed)
Name: AQUILLA VOILES MRN: [id number] DOB: 1950-07-11    ADMISSION DATE:  03/04/2017 CONSULTATION DATE: 1/22   REFERRING MD :  arriene CHIEF COMPLAINT: Tracheostomy care  BRIEF PATIENT DESCRIPTION:  This is a 67 year old patient well-known to me, followed at the tracheostomy clinic.  He has been chronically trach dependent with a remote history Of head and neck cancer with severe radio osteonecrosis as a consequence of his radiation therapy with resultant marked mandibular deformity and chronic lymphedema.  I last saw him in the tracheostomy clinic on 12/19 for routine tracheostomy change.  He had previously been using a proximal XLT cuffless tracheostomy size 6 however I have been concerned that this trach was actually too long for him.  His neck is actually quite short and due to the angle of the tracheostomy XLT tracheostomy have been difficult to place and quite traumatic in nature.  Because of this we opted to trial a size 6 cuffless [place] tracheostomy.  Thinking was using the softer silicone tracheostomy may prevent trauma in the future for tracheostomy changes.  The unfortunate part about this tracheostomy is that it does not have a disposable inner cannula.  Mr. Rabine was having difficulty with frequent plugging, as he typically does he became anxious and dislodged his tracheostomy with concerns about trach occlusion on 1/17 and had tracheostomy changed by ENT.  At that point they put a regular size 6 cuffed Shiley trach in.  He returns to the emergency room after being discharged on 1/19.  EMS was summoned  emergently as he was acutely hypoxic with saturations in the 50s.  On arrival to the ER he was on 15 L oxygen.  He was suctioned by respiratory therapy which noted bloody tracheal secretions.  He quickly returned to his normal respiratory state following this he was admitted for mucous plugging,  supported medically, and pulmonary asked to evaluate on 1/22 as the patient continues to  complain of his new tracheostomy stating concerns that he would not able to be able to care for this 1 at home and he preferred his original tracheostomy to his current trach.   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  has a past surgical history that includes PEG tube placement (11/1999); Tracheostomy (11/1999); Gastrostomy tube placement; ir generic historical (04/03/2016); Tracheostomy tube placement (11/24/2016); and Tracheostomy tube placement (N/A, 12/29/2016). Prior to Admission medications   Medication Sig Start Date End Date Taking? Authorizing Provider  albuterol (PROVENTIL HFA;VENTOLIN HFA) 108 (90 Base) MCG/ACT inhaler Inhale 2 puffs into the lungs every 4 (four) hours as needed for wheezing or shortness of breath. 03/09/16  Yes Eubanks, Carlos American, NP  AMBULATORY NON FORMULARY MEDICATION Trach(Shiley 6.0 XLT UP uncuffed), [place], [place] holders, Disposable inner cannulas 07/08/15  Yes Gildardo Cranker, DO  IBU 400 MG tablet take 1 tablet by mouth three times a day if needed for pain Patient taking differently: Take 400 mg by mouth three times a day as needed for pain 01/17/17  Yes Sherrie Mustache  K, NP  ipratropium-albuterol (DUONEB) 0.5-2.5 (3) MG/3ML SOLN Take 3 mLs by nebulization every 6 (six) hours as needed. Patient  taking differently: Take 3 mLs by nebulization every 6 (six) hours as needed (for wheezing or shortness of breath).  08/01/16  Yes Robyn Haber, MD  losartan (COZAAR) 100 MG tablet Place 1 tablet (100 mg total) into feeding tube daily. 11/27/16  Yes Cherene Altes, MD  naphazoline-pheniramine (NAPHCON-A) 0.025-0.3 % ophthalmic solution Place 2 drops into both eyes 4 (four) times daily as needed for eye irritation or allergies.    Yes [provider]  Nutritional Supplements (FEEDING SUPPLEMENT, JEVITY 1.2 CAL,) LIQD Place 474 mLs into feeding tube every 6 (six) hours. 11/27/16  Yes Cherene Altes, MD  oxymetazoline (12 HOUR NASAL SPRAY) 0.05 % nasal spray Place 2 sprays into the nose 2 (two) times daily as needed for congestion.    Yes [provider]  ranitidine (ZANTAC) 150 MG tablet Take 150 mg per tube two times a day 11/27/16  Yes Cherene Altes, MD  triamcinolone cream (KENALOG) 0.1 % Apply 1 application topically 2 (two) times daily. APPLY TO AFFECTED AREAS 11/27/16  Yes Cherene Altes, MD  betamethasone dipropionate 0.05 % lotion Apply topically 2 (two) times daily. To rash on leg Patient not taking: Reported on 03/05/2017 10/20/15   Gildardo Cranker, DO   Allergies  Allergen Reactions  . Oxybutynin Chloride Nausea And Vomiting  . Codeine Nausea And Vomiting and Rash    FAMILY HISTORY:  family history includes Cancer in his other; Diabetes in his mother and other; Heart disease in his father; Stroke in his other. SOCIAL HISTORY:  reports that he quit smoking about 20 years ago. He has a 23.00 pack-year smoking history. he has never used smokeless tobacco. He reports that he does not drink alcohol or use drugs.  REVIEW OF SYSTEMS:    Constitutional: Negative for fever, chills, weight loss, malaise/fatigue and diaphoresis.  HENT: Negative for hearing loss, ear pain, nosebleeds, congestion, sore throat, neck pain, tinnitus and ear discharge.   Eyes:  Negative for blurred vision, double vision, photophobia, pain, discharge and redness.  Respiratory: Negative for cough, hemoptysis, sputum production, shortness of breath, wheezing and stridor.   Cardiovascular: Negative for chest pain, palpitations, orthopnea, claudication, leg swelling and PND.  Gastrointestinal: Negative for heartburn, nausea, vomiting, abdominal pain, diarrhea, constipation, blood in stool and melena.  Genitourinary: Negative for dysuria, urgency, frequency, hematuria and flank pain.  Musculoskeletal: Negative for myalgias, back pain, joint pain and falls.  Skin: Negative for itching and rash.  Neurological: Negative for dizziness, tingling, tremors, sensory change, speech change, focal weakness, seizures, loss of consciousness, weakness and headaches.  Endo/Heme/Allergies: Negative for environmental allergies and polydipsia. Does not bruise/bleed easily.  SUBJECTIVE:  No distress   VITAL SIGNS: Temp:  [98.2 F (36.8 C)-99.6 F (37.6 C)] 99.5 F (37.5 C) (01/22 1147) Pulse Rate:  [69-88] 80 (01/22 1247) Resp:  [19-29] 24 (01/22 1247) BP: (114-157)/(65-107) 139/79 (01/22 1247) SpO2:  [84 %-100 %] 98 % (01/22 1247) FiO2 (%):  [40 %] 40 % (01/22 1247) Weight:  [148 lb 2.4 oz (67.2 kg)] 148 lb 2.4 oz (67.2 kg) (01/21 2131)  PHYSICAL EXAMINATION: General: 67 year old African-American male currently no acute distress Neuro: Awake alert oriented no focal deficits HEENT: Profound mandibular Deformation, neck contracted to the right Cardiovascular: Regular rate and rhythm Lungs: Clear Abdomen: Soft nontender Musculoskeletal: No edema Skin: Warm and dry  Recent Labs  Lab 03/02/17  0510 03/04/17 2224 03/05/17 0418  NA 132* 131* 133*  K 3.8 4.5 4.7  CL 96* 95* 97*  CO2 _0 BUN _1 CREATININE 0.78 0.93 0.93  GLUCOSE 110* 94 81   Recent Labs  Lab 03/01/17 1940 03/02/17 0510 03/04/17 2224  HGB 14.8 14.4 13.3  HCT 44.1 42.3 40.3  WBC 7.6 6.1 7.7    PLT 273 256 214   Dg Chest Portable 1 View  Result Date: 03/04/2017 CLINICAL DATA:  Respiratory distress. Tracheostomy placed yesterday. Shortness of breath. EXAM: PORTABLE CHEST 1 VIEW COMPARISON:  03/01/2017 FINDINGS: Shallow inspiration. Normal heart size and pulmonary vascularity. Lungs are clear. No blunting of costophrenic angles. No pneumothorax. Tracheostomy tube is present. IMPRESSION: Shallow inspiration.  No evidence of active pulmonary disease. Electronically Signed   By: Lucienne Capers M.D.   On: 03/04/2017 21:48   Procedure: The current #6 cuffless tracheostomy was removed without difficulty.  Using bedside bronchoscope the size 6 proximal XLT tracheostomy was placed successfully without difficulty.  ASSESSMENT / PLAN:   Tracheostomy dependent in the setting of severe radio-osteonecrosis  Mucous plugging Tracheostomy mal-positioning  Discussion We had tried a shorter distance trach using the [place] silicone style tracheostomy in effort to minimize trauma during routine trach changes.  Unfortunately this does not have a replaceable and internal cannula and he returned with tracheostomy plugging.  A size 6 regular Shiley was placed unfortunately Mr. Kosel's had ongoing dyspnea since placement.  Also having trouble with this particular trach model removing the inner cannula.  We replaced the size 6 tracheostomy with his original size 6 XLT proximal trach.  This was done over a bronchoscope given his mandibular deformity.  I did note small amount of increased tissue growth more proximal to the trach, the XLT reaches beyond this point.  I wonder if this was why he was more short of breath with a regular size Shiley.  Plan/rec Okay to discharge to home from our standpoint He will follow-up in the trach clinic in 10-12 weeks We will keep the size 6 proximal XLT.  He is comfortable with this, and I think further exploration of other tracheostomy sizes will likely result in more  anxiety and more frequent emergency room visits  [name] ACNP-BC [place] Pager # [phone] OR # [phone] if no answer  03/06/2017, 1:24 PM  Attending Note:  69 year year old male with chronic tracheostomy significant airway compromise who presents to the hospital with chief complaint of trach dislodgement.  Patient was seen over the weekend by ENT and a shiley was placed and patient was discharged to return to the hospital with it dislodged again.  On exam, lungs with coarse BS diffusely.  I reviewed CXR myself, trach is in good position but short with no acute disease noted.  Discussed with PCCM-NP.  Trach status:             - Change to cuffless XLT over a bronchoscope  Hypoxemia:             - Titrate O2 for sat of 88-92%  Difficult airway:             - Trach should not be changed without bronchoscopy in the future given complexity of the airway  Deconditioning:             - PT evaluation             - Ambulation  Patient seen  and examined, agree with above note.  I dictated the care and orders written for this patient under my direction.  Rush Farmer, [place]

## 2017-03-06 NOTE — Progress Notes (Signed)
Initial Nutrition Assessment   DOCUMENTATION CODES:   Not applicable  INTERVENTION:    Add Adult Tube Feeding Protocol  Continue bolus TF regimen: Jevity 1.2: 474 ml every 6 hours  Provides 2304 kcals, 107 gm protein, 1549 ml free water daily   NUTRITION DIAGNOSIS:   Inadequate oral intake related to inability to eat as evidenced by NPO status(TF dependency via PEG tube)  GOAL:   Patient will meet greater than or equal to 90% of their needs  MONITOR:   TF tolerance, Labs, Skin, Weight trends  REASON FOR ASSESSMENT:   New TF  ASSESSMENT:   67 y.o. Male with history of head and neck cancer status post extensive surgery and radiation patient is trach and PEG dependent, diabetes mellitus type 2 on diet, COPD and hypertension came to the ER after patient felt that his trach may be blocked and he removed it himself  Pt with chronic trach.  Presented from home c/o respiratory distress. Spoke with RN. Pt does not take anything by mouth.  Home TF regimen is Jevity 1.2 formula: 2 cans QID via PEG tube. Per readings below, pt has had a 13% weight loss x 1 year. Not significant for time frame. Labs and medications reviewed. Na 133 (L).  NUTRITION - FOCUSED PHYSICAL EXAM:  Unable to complete at this time. Pt using urinal.  Diet Order:  Diet NPO time specified  EDUCATION NEEDS:   No education needs have been identified at this time  Skin:  Skin Assessment: Reviewed RN Assessment  Last BM:  1/22  Height:   Ht Readings from Last 1 Encounters:  03/05/17 5\' 10"  (1.778 m)   Weight:   Wt Readings from Last 1 Encounters:  03/05/17 148 lb 2.4 oz (67.2 kg)   Wt Readings from Last 10 Encounters:  03/05/17 148 lb 2.4 oz (67.2 kg)  03/02/17 153 lb 3.5 oz (69.5 kg)  11/27/16 159 lb 6.3 oz (72.3 kg)  04/11/16 171 lb (77.6 kg)  03/09/16 171 lb 9.6 oz (77.8 kg)  02/25/16 169 lb (76.7 kg)  01/30/16 169 lb (76.7 kg)  01/26/16 169 lb 9.6 oz (76.9 kg)  01/04/16 166 lb (75.3  kg)  10/20/15 168 lb 3.2 oz (76.3 kg)   Ideal Body Weight:  75.4 kg  BMI:  Body mass index is 21.26 kg/m.  Estimated Nutritional Needs:   Kcal:  1950-2150  Protein:  95-110 gm  Fluid:  1.9-2.1 L  Arthur Holms, RD, LDN Pager #: [phone] After-Hours Pager #: [phone]

## 2017-03-07 ENCOUNTER — Inpatient Hospital Stay (HOSPITAL_COMMUNITY): Admission: RE | Admit: 2017-03-07 | Payer: Medicare Other | Source: Ambulatory Visit

## 2017-03-07 DIAGNOSIS — E119 Type 2 diabetes mellitus without complications: Secondary | ICD-10-CM

## 2017-03-07 DIAGNOSIS — C329 Malignant neoplasm of larynx, unspecified: Secondary | ICD-10-CM

## 2017-03-07 DIAGNOSIS — J439 Emphysema, unspecified: Secondary | ICD-10-CM

## 2017-03-07 DIAGNOSIS — E871 Hypo-osmolality and hyponatremia: Secondary | ICD-10-CM

## 2017-03-07 DIAGNOSIS — I10 Essential (primary) hypertension: Secondary | ICD-10-CM

## 2017-03-07 LAB — GLUCOSE, CAPILLARY
GLUCOSE-CAPILLARY: 105 mg/dL — AB (ref 65–99)
GLUCOSE-CAPILLARY: 116 mg/dL — AB (ref 65–99)
GLUCOSE-CAPILLARY: 139 mg/dL — AB (ref 65–99)

## 2017-03-07 MED ORDER — LOPERAMIDE HCL 2 MG PO CAPS: 4.0000 mg | ORAL_CAPSULE | Freq: Once | ORAL | Status: DC

## 2017-03-07 MED ORDER — LOPERAMIDE HCL 1 MG/5ML PO LIQD
4.0000 mg | Freq: Once | ORAL | Status: DC
  Filled 2017-03-07: qty 20

## 2017-03-07 NOTE — Evaluation (Addendum)
Occupational Therapy Evaluation Patient Details Name: Nicholas Cobb MRN: [id number] DOB: [birth year] Today's Date: 03/07/2017    History of Present Illness Pt is a 67 y/o male admitted secondary to respiratory failure. Pt is s/p trach replacement on 1/22. PMH inlcudes DM, throat cancer, emphysema, HTN, s/p trach placement, and s/p peg tube placement.    Clinical Impression   Pt reports he was managing ADL independently PTA. Currently pt overall min assist for ADL and functional mobility. Pt presenting with impaired balance and decreased safety awareness impacting his independence and safety with ADL and functional mobility. Would prefer SNF placement for continued rehab but pt refusing; recommending [facility] for follow up to maximize independence and safety with ADL and functional mobility upon return home alone. Pt would benefit from continued skilled OT to address established goals.    Follow Up Recommendations  SNF;Supervision/Assistance - 24 hour(pt refusing SNF; will need HHOT)    Equipment Recommendations  3 in 1 bedside commode    Recommendations for Other Services       Precautions / Restrictions Precautions Precautions: Fall Restrictions Weight Bearing Restrictions: No      Mobility Bed Mobility Overal bed mobility: Needs Assistance Bed Mobility: Supine to Sit     Supine to sit: Supervision     General bed mobility comments: Supervision for safety. Required assist with line management.   Transfers Overall transfer level: Needs assistance Equipment used: None;Rolling walker (2 wheeled) Transfers: Sit to/from [facility] Sit to Stand: Min assist;Mod assist Stand pivot transfers: Min assist       General transfer comment: Min A for lift assist and steadying upon standing. Required mod A for controlled descent to return to sitting. Min A for steadying to perform stand pivot to [facility] without AD.     Balance Overall balance assessment: Needs  assistance Sitting-balance support: No upper extremity supported;Feet supported Sitting balance-Leahy Scale: Good     Standing balance support: Bilateral upper extremity supported;During functional activity Standing balance-Leahy Scale: Poor Standing balance comment: Reliant on BUE support                            ADL either performed or assessed with clinical judgement   ADL Overall ADL's : Needs assistance/impaired Eating/Feeding: NPO   Grooming: Set up;Supervision/safety;Sitting   Upper Body Bathing: Minimal assistance;Sitting   Lower Body Bathing: Minimal assistance;Sit to/from stand   Upper Body Dressing : Minimal assistance;Sitting   Lower Body Dressing: Minimal assistance;Sit to/from stand   Toilet Transfer: Minimal assistance;Ambulation;BSC;RW Toilet Transfer Details (indicate cue type and reason): Pt assisted to [facility] at end of session         Functional mobility during ADLs: Minimal assistance;Rolling walker General ADL Comments: SpO2 in 90s throughout session on RA.     Vision         Perception     Praxis      Pertinent Vitals/Pain Pain Assessment: Faces Faces Pain Scale: Hurts little more Pain Location: trach site  Pain Descriptors / Indicators: Grimacing Pain Intervention(s): Limited activity within patient's tolerance;Monitored during session;Repositioned     Hand Dominance Right   Extremity/Trunk Assessment Upper Extremity Assessment Upper Extremity Assessment: Defer to OT evaluation   Lower Extremity Assessment Lower Extremity Assessment: Generalized weakness   Cervical / Trunk Assessment Cervical / Trunk Assessment: Kyphotic   Communication Communication Communication: Tracheostomy(used notepad to communicate)   Cognition Arousal/Alertness: Awake/alert Behavior During Therapy: WFL for tasks assessed/performed Overall Cognitive Status:  Impaired/Different from baseline Area of Impairment: Safety/judgement                          Safety/Judgement: Decreased awareness of safety;Decreased awareness of deficits     General Comments: Pt very unaware of current deficits. Very focused on wanting to go home to use nebulizer.    General Comments  Educated about SNF recommendations, however, pt will likely refuse. VSS throughout.     Exercises     Shoulder Instructions      Home Living Family/patient expects to be discharged to:: Private residence Living Arrangements: Children Available Help at Discharge: Family;Available PRN/intermittently Type of Home: House Home Access: Level entry     Home Layout: One level     Bathroom Shower/Tub: Teacher, early years/pre: Standard     Home Equipment: None          Prior Functioning/Environment Level of Independence: Independent                 OT Problem List: Decreased strength;Decreased activity tolerance;Impaired balance (sitting and/or standing);Decreased safety awareness;Decreased knowledge of use of DME or AE      OT Treatment/Interventions: Self-care/ADL training;Energy conservation;DME and/or AE instruction;Therapeutic activities;Patient/family education;Balance training    OT Goals(Current goals can be found in the care plan section) Acute Rehab OT Goals Patient Stated Goal: to go home  OT Goal Formulation: With patient Time For Goal Achievement: 03/21/17 Potential to Achieve Goals: Good ADL Goals Pt Will Perform Upper Body Bathing: with modified independence;sitting Pt Will Perform Lower Body Bathing: with supervision;sit to/from stand Pt Will Transfer to Toilet: with supervision;bedside commode;ambulating Pt Will Perform Toileting - Clothing Manipulation and hygiene: with supervision;sit to/from stand Pt Will Perform Tub/Shower Transfer: Tub transfer;ambulating;3 in 1;rolling walker;with supervision  OT Frequency: Min 2X/week   Barriers to D/C: Decreased caregiver support  pt lives alone        Co-evaluation   Reason for Co-Treatment: To address functional/ADL transfers;For patient/therapist safety PT goals addressed during session: Mobility/safety with mobility;Balance;Proper use of DME        AM-PAC PT "6 Clicks" Daily Activity     Outcome Measure Help from another person eating meals?: Total Help from another person taking care of personal grooming?: A Little Help from another person toileting, which includes using toliet, bedpan, or urinal?: A Little Help from another person bathing (including washing, rinsing, drying)?: A Little Help from another person to put on and taking off regular upper body clothing?: A Little Help from another person to put on and taking off regular lower body clothing?: A Little 6 Click Score: 16   End of Session Equipment Utilized During Treatment: Rolling walker;Gait belt;Oxygen Nurse Communication: Mobility status  Activity Tolerance: Patient tolerated treatment well Patient left: with call bell/phone within reach;Other (comment)(sitting on BSC next to bed)  OT Visit Diagnosis: Unsteadiness on feet (R26.81)                Time: 1241-1316 OT Time Calculation (min): 35 min Charges:  OT General Charges $OT Visit: 1 Visit OT Evaluation $OT Eval Moderate Complexity: 1 Mod G-Codes:     Haynes Giannotti A. Ulice Brilliant, M.S., OTR/L Pager: [place] 03/07/2017, 3:39 PM

## 2017-03-07 NOTE — Consult Note (Addendum)
   [facility] CM Inpatient Consult   03/07/2017  NATHANEL TALLMAN 1950-04-29 [id number]    Patient screened for [place] Management program due to frequent hospitalizations. Spoke with (covering) inpatient RNCM who indicates Mr. Betzold could benefit from the additional support.  Spoke with Mr. Mcgrail at bedside. He has a trach so he writes his responses to questions down on a paper.  He lives alone. He reports he does his own trach care and tube feeds. He goes to [facility] every week. He uses [employer] for transportation. States his daughter comes by daily after work to check on him. He denies having a service that can speak for him over the phone when at home. Denies concerns with obtaining medications. Biomedical scientist permission to contact his daughter, Tomasa Hose at [phone].  Telephone call made to Tomasa Hose, daughter at [phone] to request that she be the contact person for post discharge calls and follow up. Daughter is agreeable to this. States she works but gets off at 2:45 pm daily. Patient agreeable to this plan as well and [facility] Care Management written consent obtained.  Discussed that [place] Management will not interfere with services provided by home health.  Will make referral to [facility].  Writer sent notification to Primary Care Provider's contact person at [facility] to make aware that [place] Management will follow post discharge.    Marthenia Rolling, MSN-Ed, RN,BSN [facility] Liaison [phone]

## 2017-03-07 NOTE — Progress Notes (Addendum)
Per RNCM patient needed assistance with transportation back home and patient did not want to pay for [facility] to pick him up. RNCM spoke with patients sister and she stated that a cab voucher will be sufficient for patient transportation. CSW gave cab voucher to RN to call once patient is ready for discharge.   Rhea Pink, MSW,  [place]

## 2017-03-07 NOTE — Care Management Note (Addendum)
Case Management Note  Patient Details  Name: KAYCEN WHITWORTH MRN: [id number] Date of Birth: 08/13/1950  Subjective/Objective:    Chronic trach, mucous plugging, PEG tube              Action/Plan: Spoke to pt and he was able to write. He does is trach care at home. Has suction but no oxygen. Offered choice for HH/list provided. Pt had not preference for HH. [facility] contacted for HHPT, and RN. Contacted pt's dtr, Benjamine Mola. States he gets trach supplies from [place]. Contacted [facility], and spoke to rep. Pt has suction, suction supplies, trach supplies, neb machine/meds, and humidifier. [facility]'s RT will visit within 48 hours. Utilizes [employer] for transportation. PT recommended SNF, pt declines. Pt wants taxi, declines [facility].   Contacted [facility] for RW.   PCP Sherrie Mustache K     Expected Discharge Date:   03/07/2017              Expected Discharge Plan:  [place]  In-House Referral:  NA  Discharge planning Services  CM Consult  Post Acute Care Choice:  Home Health Choice offered to:  Patient  DME Arranged:  N/A DME Agency:  NA  HH Arranged:  RN, PT [place] Agency:  [place]  Status of Service:  Completed, signed off  If discussed at [place] of Stay Meetings, dates discussed:    Additional Comments:  Erenest Rasher, RN 03/07/2017, 2:33 PM

## 2017-03-07 NOTE — Progress Notes (Signed)
Pt now on 28% trach collar.  Currently tolerating well.  RN aware.  Will continue to monitor.

## 2017-03-07 NOTE — Evaluation (Signed)
Physical Therapy Evaluation Patient Details Name: Nicholas Cobb MRN: [id number] DOB: [birth year] Today's Date: 03/07/2017   History of Present Illness  Pt is a 67 y/o male admitted secondary to respiratory failure. Pt is s/p trach replacement on 1/22. PMH inlcudes DM, throat cancer, emphysema, HTN, s/p trach placement, and s/p peg tube placement.   Clinical Impression  Pt admitted secondary to problem above with deficits below. Pt requiring min to min guard assist with mobility and required use of RW secondary to unsteadiness. Pt very unaware of current safety deficits with mobility despite education. Feel pt is a fall risk and would benefit from post acute SNF at d/c, however, pt will likely refuse. If pt refuses will need HHPT. Will continue to follow acutely to maximize functional mobility independence and safety.     Follow Up Recommendations SNF;Supervision/Assistance - 24 hour    Equipment Recommendations  Rolling walker with 5" wheels    Recommendations for Other Services       Precautions / Restrictions Precautions Precautions: Fall Restrictions Weight Bearing Restrictions: No      Mobility  Bed Mobility Overal bed mobility: Needs Assistance Bed Mobility: Supine to Sit     Supine to sit: Supervision     General bed mobility comments: Supervision for safety. Required assist with line management.   Transfers Overall transfer level: Needs assistance Equipment used: None;Rolling walker (2 wheeled) Transfers: Sit to/from [facility] Sit to Stand: Min assist;Mod assist Stand pivot transfers: Min assist       General transfer comment: Min A for lift assist and steadying upon standing. Required mod A for controlled descent to return to sitting. Min A for steadying to perform stand pivot to [facility] without AD.   Ambulation/Gait Ambulation/Gait assistance: Min assist;Min guard;+2 safety/equipment Ambulation Distance (Feet): 150 Feet Assistive device:  None;Rolling walker (2 wheeled) Gait Pattern/deviations: Step-through pattern;Decreased stride length Gait velocity: Decreased Gait velocity interpretation: Below normal speed for age/gender General Gait Details: Slow, unsteady gait without use of AD. Pt with improved steadiness with use of RW. Educated to use with mobility to increase safety, however, pt refusing to use. Unaware of deficits and writing that he would be better when he got his nebulizer. SOB noted during gait on RA. Oxygen sats ranging from 88%-93% on RA.   Stairs            Wheelchair Mobility    Modified Rankin (Stroke Patients Only)       Balance Overall balance assessment: Needs assistance Sitting-balance support: No upper extremity supported;Feet supported Sitting balance-Leahy Scale: Good     Standing balance support: Bilateral upper extremity supported;During functional activity Standing balance-Leahy Scale: Poor Standing balance comment: Reliant on BUE support                              Pertinent Vitals/Pain Pain Assessment: Faces Faces Pain Scale: Hurts little more Pain Location: trach site  Pain Descriptors / Indicators: Grimacing Pain Intervention(s): Limited activity within patient's tolerance;Monitored during session;Repositioned    Home Living Family/patient expects to be discharged to:: Private residence Living Arrangements: Children Available Help at Discharge: Family;Available PRN/intermittently Type of Home: House Home Access: Level entry     Home Layout: One level Home Equipment: None      Prior Function Level of Independence: Independent               Hand Dominance   Dominant Hand: Right    Extremity/Trunk  Assessment   Upper Extremity Assessment Upper Extremity Assessment: Defer to OT evaluation    Lower Extremity Assessment Lower Extremity Assessment: Generalized weakness    Cervical / Trunk Assessment Cervical / Trunk Assessment: Kyphotic   Communication   Communication: Tracheostomy(used notepad to communicate)  Cognition Arousal/Alertness: Awake/alert Behavior During Therapy: WFL for tasks assessed/performed Overall Cognitive Status: Impaired/Different from baseline Area of Impairment: Safety/judgement                         Safety/Judgement: Decreased awareness of safety;Decreased awareness of deficits     General Comments: Pt very unaware of current deficits. Very focused on wanting to go home to use nebulizer.       General Comments General comments (skin integrity, edema, etc.): Educated about SNF recommendations, however, pt will likely refuse. VSS throughout.     Exercises     Assessment/Plan    PT Assessment Patient needs continued PT services  PT Problem List Decreased strength;Decreased balance;Decreased knowledge of use of DME;Decreased safety awareness;Decreased mobility;Cardiopulmonary status limiting activity       PT Treatment Interventions DME instruction;Gait training;Functional mobility training;Therapeutic activities;Balance training;Therapeutic exercise;Neuromuscular re-education;Patient/family education    PT Goals (Current goals can be found in the Care Plan section)  Acute Rehab PT Goals Patient Stated Goal: to go home  PT Goal Formulation: With patient Time For Goal Achievement: 03/21/17 Potential to Achieve Goals: Fair    Frequency Min 3X/week   Barriers to discharge Decreased caregiver support Lives alone     Co-evaluation PT/OT/SLP Co-Evaluation/Treatment: Yes Reason for Co-Treatment: To address functional/ADL transfers;For patient/therapist safety PT goals addressed during session: Mobility/safety with mobility;Balance;Proper use of DME         AM-PAC PT "6 Clicks" Daily Activity  Outcome Measure Difficulty turning over in bed (including adjusting bedclothes, sheets and blankets)?: A Little Difficulty moving from lying on back to sitting on the side of the  bed? : A Little Difficulty sitting down on and standing up from a chair with arms (e.g., wheelchair, bedside commode, etc,.)?: Unable Help needed moving to and from a bed to chair (including a wheelchair)?: A Little Help needed walking in hospital room?: A Little Help needed climbing 3-5 steps with a railing? : A Lot 6 Click Score: 15    End of Session Equipment Utilized During Treatment: Gait belt Activity Tolerance: Patient tolerated treatment well Patient left: with call bell/phone within reach;Other (comment)(BSC; RN notified) Nurse Communication: Mobility status PT Visit Diagnosis: Unsteadiness on feet (R26.81);Muscle weakness (generalized) (M62.81)    Time: 2536-6440 PT Time Calculation (min) (ACUTE ONLY): 35 min   Charges:   PT Evaluation $PT Eval Moderate Complexity: 1 Mod     PT G Codes:        Leighton Ruff, PT, DPT  Acute Rehabilitation Services  Pager: [phone]   Rudean Hitt 03/07/2017, 3:33 PM

## 2017-03-07 NOTE — Discharge Summary (Signed)
Physician Discharge Summary  DALLAS TOROK UUV:[id number] DOB: [birth year] DOA: 03/04/2017  PCP: [name], NP  Admit date: 03/04/2017 Discharge date: 03/07/2017  Admitted From: Home Disposition: Home (patient declines SNF transfer)  Recommendations for Outpatient Follow-up:  1. Follow up with PCP in 1 week 2. Follow up with pulmonology/trach clinic 3. Please obtain BMP/CBC in one week 4. Please follow up on the following pending results: None  Home Health: PT, RN Equipment/Devices: Trach, oxygen, rolling walker  Discharge Condition: Stable CODE STATUS: Full code Diet recommendation: Heart healthy   Brief/Interim Summary:  Admission HPI written by Tawni Millers, MD   Chief Complaint: Acute SOB   HPI: Nicholas Cobb is a 67 y.o. male with medical history significant for laryngeal cancer status post radiation, now dependent on trach and PEG, presenting to the emergency department for evaluation of acute shortness of breath.  Patient reports that he has been in his usual state since discharge from the hospital yesterday until developing acute shortness of breath tonight that he attributed to tracheostomy problem.  He called EMS and he was reported to have an O2 saturation of 50% on their arrival.  This improved to the 90s with nonrebreather.  Denies recent fevers or chills and denies chest pain or palpitations.  ED Course: Upon arrival to the ED, patient is found to be afebrile, saturating well on 15 L/min via nonrebreather, slightly tachycardic, and with stable blood pressure.  Chest x-ray is negative for acute cardiopulmonary disease and chemistry panel is notable for chronic stable hyponatremia.  CBC is unremarkable.  Inner trach cannula was removed and cleaned of mucus and blood clot.  He was suctioned through the trach.  He improved with this and heart rate normalized.  He continues to require 4 L/min of supplemental oxygen and frequent suctioning.  He will be  observed in the stepdown unit for ongoing evaluation and management chronic respiratory failure with hypoxia, suspected secondary to occlusion of his tracheostomy by mucus and blood.    Hospital course:  1. 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  Pulmonary consulted and trach changed to #6 Shiley long to bypass soft tissue with potential to obstruct trach. Will continue trach collar and bronchodilator therapy.  2. COPD.  No clinical signs of exacerbation, on supplemental 02 per trach collar, as needed bronchodilator therapy.  3. HTN.  On losartan for blood pressure control.  4, T2DM.  Glucose cover and monitoring with insulin sliding scale.    Discharge Diagnoses:  Principal Problem:   Acute on chronic respiratory failure with hypoxia (HCC) Active Problems:   Laryngeal cancer (HCC)   Pulmonary emphysema (HCC)   Hyponatremia   Essential hypertension   Tracheostomy dependence ([place])   Controlled diabetes mellitus type 2 with complications ([place])   Hypoxia    Discharge Instructions  Discharge Instructions    AMB Referral to [place] Management   Complete by:  As directed    Please assign to [place] for transition of care. Has had frequent hospitalizations. Notification sent to PCP contact to make aware [facility] to follow post discharge. Written consent obtained. Patient has trach. Daughter Tomasa Hose [phone] for transition of calls. Currently at [facility]. Please call with questions. Marthenia Rolling, [place], [facility] [phone]   Reason for consult:  Please assign to [facility] RNCM   Expected date of contact:  1-3 days (reserved for hospital discharges)   Diet - low sodium heart  healthy   Complete by:  As directed    Increase activity slowly   Complete by:  As directed      Allergies as of 03/07/2017      Reactions   Oxybutynin Chloride  Nausea And Vomiting   Codeine Nausea And Vomiting, Rash      Medication List    TAKE these medications   12 HOUR NASAL SPRAY 0.05 % nasal spray Generic drug:  oxymetazoline Place 2 sprays into the nose 2 (two) times daily as needed for congestion.   albuterol 108 (90 Base) MCG/ACT inhaler Commonly known as:  PROVENTIL HFA;VENTOLIN HFA Inhale 2 puffs into the lungs every 4 (four) hours as needed for wheezing or shortness of breath.   AMBULATORY NON FORMULARY MEDICATION Trach(Shiley 6.0 XLT UP uncuffed), Trach Care Kit, Trach holders, Disposable inner cannulas   betamethasone dipropionate 0.05 % lotion Apply topically 2 (two) times daily. To rash on leg   feeding supplement (JEVITY 1.2 CAL) Liqd Place 474 mLs into feeding tube every 6 (six) hours.   IBU 400 MG tablet Generic drug:  ibuprofen take 1 tablet by mouth three times a day if needed for pain What changed:  See the new instructions.   ipratropium-albuterol 0.5-2.5 (3) MG/3ML Soln Commonly known as:  DUONEB Take 3 mLs by nebulization every 6 (six) hours as needed. What changed:  reasons to take this   losartan 100 MG tablet Commonly known as:  COZAAR Place 1 tablet (100 mg total) into feeding tube daily.   naphazoline-pheniramine 0.025-0.3 % ophthalmic solution Commonly known as:  NAPHCON-A Place 2 drops into both eyes 4 (four) times daily as needed for eye irritation or allergies.   ranitidine 150 MG tablet Commonly known as:  ZANTAC Take 150 mg per tube two times a day   triamcinolone cream 0.1 % Commonly known as:  KENALOG Apply 1 application topically 2 (two) times daily. APPLY TO AFFECTED AREAS      Follow-up Information    [facility] Follow up.   Specialty:  Home Health Services Why:  Home Health RN and Physical Therapy-agency will call to arrange initial visit Contact information: [place] [address] [phone]        Inc[facility] [place] Follow up.    Why:  Respiratory Therapy - will call to arrange visit to assist with monitory supplies and home set up Contact information: [place] [address] [phone]        [name], NP. Schedule an appointment as soon as possible for a visit in 1 week(s).   Specialty:  Geriatric Medicine Contact information: [place] [place] [address] [phone]          Allergies  Allergen Reactions  . Oxybutynin Chloride Nausea And Vomiting  . Codeine Nausea And Vomiting and Rash    Consultations:  Pulmonology   Procedures/Studies: Dg Chest Portable 1 View  Result Date: 03/04/2017 CLINICAL DATA:  Respiratory distress. Tracheostomy placed yesterday. Shortness of breath. EXAM: PORTABLE CHEST 1 VIEW COMPARISON:  03/01/2017 FINDINGS: Shallow inspiration. Normal heart size and pulmonary vascularity. Lungs are clear. No blunting of costophrenic angles. No pneumothorax. Tracheostomy tube is present. IMPRESSION: Shallow inspiration.  No evidence of active pulmonary disease. Electronically Signed   By: Lucienne Capers M.D.   On: 03/04/2017 21:48   Dg Chest Portable 1 View  Result Date: 03/01/2017 CLINICAL DATA:  Status post complicated tracheostomy change EXAM: PORTABLE CHEST 1  VIEW COMPARISON:  12/29/2016 chest radiograph. FINDINGS: Tracheostomy tube tip overlies the tracheal air column at the thoracic inlet. Partially visualized surgical plate, screws and clips in the right lower neck. Stable cardiomediastinal silhouette with normal heart size and aortic atherosclerosis. No pneumothorax. No pleural effusion. No pulmonary edema. Mild bibasilar scarring versus atelectasis. No acute consolidative airspace disease. IMPRESSION: 1. Tracheostomy tube appears well-positioned. 2. Mild bibasilar scarring versus atelectasis. Electronically Signed   By: Ilona Sorrel M.D.   On: 03/01/2017 20:03      Subjective: No dyspnea or chest pain  Discharge  Exam: Vitals:   03/07/17 0850 03/07/17 0947  BP:  130/82  Pulse: 79 78  Resp: 20 18  Temp:  99.7 F (37.6 C)  SpO2:  99%   Vitals:   03/07/17 0400 03/07/17 0420 03/07/17 0850 03/07/17 0947  BP: 135/70   130/82  Pulse: 68 82 79 78  Resp: (!) 25 (!) _0 Temp: 99.7 F (37.6 C)   99.7 F (37.6 C)  TempSrc: Oral   Oral  SpO2: 99% 99%  99%  Weight: 72.5 kg (159 lb 13.3 oz)     Height:        General: Pt is alert, awake, not in acute distress Cardiovascular: RRR, S1/S2 +, no rubs, no gallops Respiratory: CTA bilaterally, no wheezing, no rhonchi. Trach in place. Abdominal: Soft, NT, ND, bowel sounds + Extremities: no edema, no cyanosis    The results of significant diagnostics from this hospitalization (including imaging, microbiology, ancillary and laboratory) are listed below for reference.     Microbiology: Recent Results (from the past 240 hour(s))  MRSA PCR Screening     Status: None   Collection Time: 03/02/17  2:25 AM  Result Value Ref Range Status   MRSA by PCR NEGATIVE NEGATIVE Final    Comment:        The GeneXpert MRSA Assay (FDA approved for NASAL specimens only), is one component of a comprehensive MRSA colonization surveillance program. It is not intended to diagnose MRSA infection nor to guide or monitor treatment for MRSA infections.      Labs: BNP (last 3 results) Recent Labs    04/08/16 1407  BNP 5.8   Basic Metabolic Panel: Recent Labs  Lab 03/01/17 1940 03/02/17 0510 03/04/17 2224 03/05/17 0418  NA 131* 132* 131* 133*  K 4.3 3.8 4.5 4.7  CL 94* 96* 95* 97*  CO2 _1 GLUCOSE 175* 110* 94 81  BUN _2 CREATININE 1.00 0.78 0.93 0.93  CALCIUM 9.3 9.4 9.1 9.1   Liver Function Tests: Recent Labs  Lab 03/01/17 1940  AST 52*  ALT 31  ALKPHOS 132*  BILITOT 0.5  PROT 8.2*  ALBUMIN 4.0   No results for input(s): LIPASE, AMYLASE in the last 168 hours. No results for input(s): AMMONIA in the last 168  hours. CBC: Recent Labs  Lab 03/01/17 1940 03/02/17 0510 03/04/17 2224  WBC 7.6 6.1 7.7  NEUTROABS 3.3  --  5.7  HGB 14.8 14.4 13.3  HCT 44.1 42.3 40.3  MCV 90.9 89.1 90.6  PLT 273 256 214   Cardiac Enzymes: No results for input(s): CKTOTAL, CKMB, CKMBINDEX, TROPONINI in the last 168 hours. BNP: Invalid input(s): POCBNP CBG: Recent Labs  Lab 03/03/17 1145 03/06/17 2211 03/06/17 2332 03/07/17 0403 03/07/17 1136  GLUCAP 97 128* 102* 105* 116*   D-Dimer No results for input(s): DDIMER in the last 72 hours. Hgb A1c No  results for input(s): HGBA1C in the last 72 hours. Lipid Profile No results for input(s): CHOL, HDL, LDLCALC, TRIG, CHOLHDL, LDLDIRECT in the last 72 hours. Thyroid function studies No results for input(s): TSH, T4TOTAL, T3FREE, THYROIDAB in the last 72 hours.  Invalid input(s): FREET3 Anemia work up No results for input(s): VITAMINB12, FOLATE, FERRITIN, TIBC, IRON, RETICCTPCT in the last 72 hours. Urinalysis    Component Value Date/Time   COLORURINE YELLOW 09/14/2015 0910   APPEARANCEUR CLEAR 09/14/2015 0910   LABSPEC 1.013 09/14/2015 0910   PHURINE 7.5 09/14/2015 0910   GLUCOSEU NEGATIVE 09/14/2015 0910   GLUCOSEU NEGATIVE 10/29/2013 1118   HGBUR NEGATIVE 09/14/2015 0910   BILIRUBINUR NEGATIVE 09/14/2015 0910   BILIRUBINUR Negative 10/08/2014 0930   KETONESUR NEGATIVE 09/14/2015 0910   PROTEINUR NEGATIVE 09/14/2015 0910   UROBILINOGEN 0.2 10/08/2014 0930   UROBILINOGEN 0.2 10/29/2013 1118   NITRITE NEGATIVE 09/14/2015 0910   LEUKOCYTESUR NEGATIVE 09/14/2015 0910   Sepsis Labs Invalid input(s): PROCALCITONIN,  WBC,  LACTICIDVEN Microbiology Recent Results (from the past 240 hour(s))  MRSA PCR Screening     Status: None   Collection Time: 03/02/17  2:25 AM  Result Value Ref Range Status   MRSA by PCR NEGATIVE NEGATIVE Final    Comment:        The GeneXpert MRSA Assay (FDA approved for NASAL specimens only), is one component of  a comprehensive MRSA colonization surveillance program. It is not intended to diagnose MRSA infection nor to guide or monitor treatment for MRSA infections.      Time coordinating discharge: Over 30 minutes  SIGNED:   Cordelia Poche, MD [facility] 03/07/2017, 3:38 PM Pager [phone]  If 7PM-7AM, please contact night-coverage [email or web address] Password [email or web address]

## 2017-03-07 NOTE — Progress Notes (Signed)
Order received to discharge patient.  Telemetry monitor removed and CCMD notified.  PIV access removed.  Discharge instructions, follow up, medications and instructions for their use discussed with patient.  Patient was sent home via taxi with taxi voucher

## 2017-03-08 ENCOUNTER — Telehealth: Payer: Self-pay

## 2017-03-08 ENCOUNTER — Other Ambulatory Visit: Payer: Self-pay

## 2017-03-08 NOTE — Patient Outreach (Addendum)
[facility]) Care Management  03/08/2017  Nicholas Cobb Apr 02, 1950 [id number]   67 year old with history of laryngeal cancer, tracheostomy, Gastrostomy tube, pulmonary emphysema, GERD, chronic pain, diabetes, HTN, respiratory failure. Client with recent admission 1/20-1/23 for acute on chronic respiratory failure with hypoxia.  RNCM called client's daughter, Nicholas Cobb (per referral designated contact person). [name] reports that she is currently at work. She reports she has not been to see client since discharge because of transportation issues. She states she will pick up her car after work today 6pm and she will see him today. She states she does not have a copy of the discharge instructions with her.   Plan: RNCM will follow up call tomorrow per [name]'s request between 10:30 and 11am on her lunch break or when she gets off after 3pm.  [name], RN, MSN, [place] Coordinator Cell: [phone]

## 2017-03-08 NOTE — Telephone Encounter (Signed)
Tried to reach patient to do TOC call. Pt has trach and is not able to communicate over the phone. I tried calling his emergency contact, his daughter, and her phone did not connect

## 2017-03-09 ENCOUNTER — Telehealth: Payer: Self-pay

## 2017-03-09 ENCOUNTER — Other Ambulatory Visit: Payer: Self-pay

## 2017-03-09 DIAGNOSIS — C153 Malignant neoplasm of upper third of esophagus: Secondary | ICD-10-CM | POA: Diagnosis not present

## 2017-03-09 NOTE — Patient Outreach (Signed)
[facility]) Care Management  03/09/2017  Nicholas Cobb [birth year] [id number]   Assessment: 67 year old with history of laryngeal cancer, tracheostomy, Gastrostomy tube, pulmonary emphysema, GERD, chronic pain, diabetes, HTN, respiratory failure. Client with recent admission 1/20-1/23 for acute on chronic respiratory failure with hypoxia.  RNCM completed transition of care call with client's daughter, Tomasa Hose. She reports client is living alone at this time and he currently does not want anyone living with him. She reports that her family is trying to get him to agree to let his sister come to stay with him, but he does not want anyone living with him at this time. She reports client is ambulatory and has several places that he will go to on his own. These are places where people know him well.  She states, client [facility] and that client went today. She states someone from there usually schedules client's follow up appointments and that this was done today. However, she is not able to state the date and time of the appointment.   She reports that client was doing well when she saw him last. She states that he is using his nebulizer machine and told her the nebulizer machine helps control wheezing.  She reports she spoke with home health agency today and that they will start care on Sunday because she works and will be able to meet them at his home on Sunday.  Plan: home visit next week.  [name], RN, MSN, [place] Coordinator Cell: [phone]

## 2017-03-09 NOTE — Telephone Encounter (Signed)
I have made an attempt to contact the patient and his daughter who is listed on the DPR, in order to follow up from recently being discharged from the hospital. I left a message on both voicemails to CB today if possible.  -MM

## 2017-03-13 ENCOUNTER — Telehealth: Payer: Self-pay

## 2017-03-13 NOTE — Telephone Encounter (Signed)
Left message on voicemail for patient to return call when available    Called patient's daughter x 3, number invalid.  Reason for call, confirm PCP and scheduled appointment if patient under [name], NP care  Last OV 03/09/16

## 2017-03-13 NOTE — Telephone Encounter (Signed)
I have tried to contact pt and emergency contact on three different occasions and have been unsuccessful with reaching the pt an have not received a CB. A nurse from [facility] did contact pts emergency contact on 03/09/17 and completed the [facility] call. FYI! -MM

## 2017-03-13 NOTE — Telephone Encounter (Signed)
Is he still at patient at our office, for some reason was thinking he established somewhere else

## 2017-03-14 ENCOUNTER — Other Ambulatory Visit: Payer: Self-pay

## 2017-03-14 ENCOUNTER — Ambulatory Visit: Payer: Self-pay

## 2017-03-14 NOTE — Patient Outreach (Signed)
[facility]) Care Management  03/14/2017  Nicholas Cobb June 12, 1950 [id number]  Prior to visit, RNCM called daughter, Tomasa Hose, regarding scheduled home visit today. She reports that she forgot about the appointment and request to reschedule visit to next week. She states she will be seeing client later this evening.   She denies any issues with client except regarding the delivery of his supplies. She states she received a message from client that his supplies were not delivered today. But [name] was unclear who was responsible for delivering supplies [facility] or the home health agency. RNCM reinforced that [facility] was listed on client's discharge instructions as provider for respiratory supplies.  Plan: RNCM will follow up with [facility], home visit rescheduled for next week.  [name], RN, MSN, [place] Coordinator Cell: [phone]

## 2017-03-14 NOTE — Telephone Encounter (Signed)
Thanks for checking on this, he has not been seen in our office in a year.

## 2017-03-14 NOTE — Telephone Encounter (Signed)
I spoke with the [facility] nurse who spoke with pt the other day, and she stated that she thought he was a pt of yours too. She has a home visit scheduled with him today and she will confirm this with him at that appointment. I will let you know as soon as I hear back from her.

## 2017-03-14 NOTE — Patient Outreach (Signed)
[facility]) Care Management  03/14/2017  Nicholas Cobb 1950/08/13 [id number]   Care coordination: RNCM called [employer] regarding respiratory equipment and was instructed to call [name] [phone]). [name] reports he is very familiar with client and will be delivering client's supplies on Tomorrow between 3pm and 4pm. [name] states he left a message on client's daughter's phone telling her that he would deliver the supplies and she did not have to come to the office to pick them up.  RNCM called and updated client's daughter. [name] thanked [facility] for the assistance. RNCM reinforced that if client needs the supplies sooner she may have to pick them up from the [place] office. She acknowledged understanding.   Plan: continue to follow.  [name], RN, MSN, [place] Coordinator Cell: [phone]

## 2017-03-15 ENCOUNTER — Other Ambulatory Visit: Payer: Self-pay

## 2017-03-15 DIAGNOSIS — C154 Malignant neoplasm of middle third of esophagus: Secondary | ICD-10-CM | POA: Diagnosis not present

## 2017-03-15 DIAGNOSIS — R609 Edema, unspecified: Secondary | ICD-10-CM | POA: Diagnosis not present

## 2017-03-15 DIAGNOSIS — C153 Malignant neoplasm of upper third of esophagus: Secondary | ICD-10-CM | POA: Diagnosis not present

## 2017-03-15 DIAGNOSIS — M8660 Other chronic osteomyelitis, unspecified site: Secondary | ICD-10-CM | POA: Diagnosis not present

## 2017-03-15 DIAGNOSIS — J4531 Mild persistent asthma with (acute) exacerbation: Secondary | ICD-10-CM | POA: Diagnosis not present

## 2017-03-15 DIAGNOSIS — H9319 Tinnitus, unspecified ear: Secondary | ICD-10-CM | POA: Diagnosis not present

## 2017-03-15 DIAGNOSIS — Z930 Tracheostomy status: Secondary | ICD-10-CM | POA: Diagnosis not present

## 2017-03-15 NOTE — Telephone Encounter (Signed)
Left message on voicemail for patient to return call when available   

## 2017-03-15 NOTE — Telephone Encounter (Signed)
THN calling to give correct number for patient's sister. I have updated in demographics.

## 2017-03-15 NOTE — Telephone Encounter (Signed)
Message copied/pasted, received via staff message from Cordella Register (referral coordinator/registration)   FYI,  A lady from [place] [facility] called (sorry I forgot to write down her name) and asked me to give a message to you both, She said she spoke with [facility] this morning and there was also a message from Ms. Amedeo Gory about Mr. Panagopoulos. She said she spoke to Mr. Simpsons daughter and to tell you that per the patients daughter YES patient plans to continue to see Janett Billow. She also said to tell you that she told the daughter that Ms. Amedeo Gory would be calling her back to schedule an appointment

## 2017-03-15 NOTE — Patient Outreach (Signed)
[facility]) Care Management  03/15/2017  WASH NIENHAUS [birth year] [id number]  Care Coordination: RNCM contacted Primary care provider's office to inform provider's CMA of client's daughter's current phone number([phone]).   RNCM also spoke with client's daughter, Tomasa Hose who reports client's primary care provider is Sherrie Mustache.   Care Coordination: RNCM called [facility]-informed that [name] confirmed Sherrie Mustache as client's primary care provider. Request they call [name] to schedule a follow up appointment.   Plan: continue to follow.  [name], RN, MSN, [place] Coordinator Cell: [phone]

## 2017-03-16 NOTE — Telephone Encounter (Signed)
Per [name] [facility] nurse) pt is still planning to continue care with you. A note was placed in the chart.  Thanks! - MM

## 2017-03-19 ENCOUNTER — Other Ambulatory Visit: Payer: Self-pay

## 2017-03-19 NOTE — Telephone Encounter (Signed)
Noted...cdavis  °

## 2017-03-19 NOTE — Patient Outreach (Signed)
[name]) Care Management   03/19/2017  Nicholas Cobb [birth year] [id number]  Nicholas Cobb is an 67 y.o. male  Subjective: "my asthma has been acting up."  Objective:  none ROS  Physical Exam skin color within normal limits. Respiration even, unlabored, rate 30, no accessory muscle use noted.  Encounter Medications:   Outpatient Encounter Medications as of 03/19/2017  Medication Sig  . albuterol (PROVENTIL HFA;VENTOLIN HFA) 108 (90 Base) MCG/ACT inhaler Inhale 2 puffs into the lungs every 4 (four) hours as needed for wheezing or shortness of breath.  . AMBULATORY NON FORMULARY MEDICATION Trach(Shiley 6.0 XLT UP uncuffed), Sandy, Trach holders, Disposable inner cannulas  . betamethasone dipropionate 0.05 % lotion Apply topically 2 (two) times daily. To rash on leg (Patient not taking: Reported on 03/05/2017)  . IBU 400 MG tablet take 1 tablet by mouth three times a day if needed for pain (Patient taking differently: Take 400 mg by mouth three times a day as needed for pain)  . ipratropium-albuterol (DUONEB) 0.5-2.5 (3) MG/3ML SOLN Take 3 mLs by nebulization every 6 (six) hours as needed. (Patient taking differently: Take 3 mLs by nebulization every 6 (six) hours as needed (for wheezing or shortness of breath). )  . losartan (COZAAR) 100 MG tablet Place 1 tablet (100 mg total) into feeding tube daily.  . naphazoline-pheniramine (NAPHCON-A) 0.025-0.3 % ophthalmic solution Place 2 drops into both eyes 4 (four) times daily as needed for eye irritation or allergies.   . Nutritional Supplements (FEEDING SUPPLEMENT, JEVITY 1.2 CAL,) LIQD Place 474 mLs into feeding tube every 6 (six) hours.  Marland Kitchen oxymetazoline (12 HOUR NASAL SPRAY) 0.05 % nasal spray Place 2 sprays into the nose 2 (two) times daily as needed for congestion.   . ranitidine (ZANTAC) 150 MG tablet Take 150 mg per tube two times a day  . triamcinolone cream (KENALOG) 0.1 % Apply 1 application topically  2 (two) times daily. APPLY TO AFFECTED AREAS   No facility-administered encounter medications on file as of 03/19/2017.     Functional Status:   In your present state of health, do you have any difficulty performing the following activities: 03/05/2017 03/03/2017  Hearing? N N  Vision? N N  Difficulty concentrating or making decisions? [name]  Walking or climbing stairs? Y Y  Dressing or bathing? Y Y  Doing errands, shopping? Y Y  Preparing Food and eating ? - -  Comment - -  Using the Toilet? - -  In the past six months, have you accidently leaked urine? - -  Do you have problems with loss of bowel control? - -  Managing your Medications? - -  Managing your Finances? - -  Housekeeping or managing your Housekeeping? - -  Some recent data might be hidden    Fall/Depression Screening:    Fall Risk  04/20/2016 03/09/2016 01/04/2016  Falls in the past year? Yes No No  Number falls in past yr: 2 or more - -  Injury with Fall? No - -  Risk Factor Category  High Fall Risk - -  Risk for fall due to : History of fall(s) - -  Follow up Education provided - -   PHQ 2/9 Scores 04/28/2016 04/20/2016 01/06/2015 10/29/2013 12/03/2012  PHQ - 2 Score 0 0 0 1 1    Assessment: 67 year old with history of laryngeal cancer, tracheostomy, Gastrostomy tube, pulmonary emphysema, GERD, chronic pain, diabetes, HTN, respiratory failure. Client with recent admission 1/20-1/23  for acute on chronic respiratory failure with hypoxia. Client is nonverbal due to tracheostomy. He is oriented and communicates through writing.  RNCM arrived at 2:10. No answer at the door. RNCM called client's daughter, [name] who states she informed client that [facility] was coming. She states that she was on her way and that client was probably in the house and was not answering the door.   [name] arrived approximately 2:45. Client opened the door. When asked how he is doing client. He writes his asthma has been flaring up. Follow up  appointments discussed.   Primary Care: [name]. Appointment scheduled for March 29, 2017. [name]- Appointment scheduled April.  [name] states home health has not been out. RNCM called [facility]. Confirmed Daughter's contact number. And [facility] representative talked with [name] regarding start of care date. Per [name], a nurse is scheduled to come out on Thursday, February 7th. She reiterated this to client.  He denies being in distress. [name] expresses client is not in distress. She states it is time for his tracheostomy care. Client writes, once he does his tracheostomy care and takes his breathing treatment he feels better. [name] states he does his tracheostomy care three times/day. RNCM offered to call both primary care and trach clinic to try to get an appointment sooner, but client shakes head that he does not want RNCM to call. RNCM reinforced with daughter the names of client's providers and encouraged her to call them as needed.  Client is not eligible for [facility] Care Management services due to his primary care is not in network. Client acknowledges understanding and expressed he was comfortable with [facility] nurse coming out on Thursday. Daughter reinforced that he has had [facility] in the past.   Plan: close case.  [name], RN, MSN, [place] Coordinator Cell: [phone]

## 2017-03-19 NOTE — Telephone Encounter (Signed)
Left message on voicemail informing patient/daughter that he is overdue for an appointment and needs to call to schedule an appointment

## 2017-03-20 NOTE — Telephone Encounter (Signed)
-----  Message from Luretha Rued, RN sent at 03/20/2017  3:44 PM EST ----- Regarding: follow up regarding primary care Good Afternoon,  I wanted to follow up with you regarding Mr. Nicholas Cobb. I met with him and his daughter, Elvin So [phone]) on yesterday and Mr. Meisinger communicated that his is seeing [name] for primary care services at this time. Thank you for all you do. Please call if you have any questions.  [name], RN, MSN, [place] Coordinator Cell: [phone]

## 2017-03-26 DIAGNOSIS — M8708 Idiopathic aseptic necrosis of bone, other site: Secondary | ICD-10-CM | POA: Diagnosis not present

## 2017-03-26 DIAGNOSIS — F809 Developmental disorder of speech and language, unspecified: Secondary | ICD-10-CM | POA: Diagnosis not present

## 2017-03-26 DIAGNOSIS — E871 Hypo-osmolality and hyponatremia: Secondary | ICD-10-CM | POA: Diagnosis not present

## 2017-03-26 DIAGNOSIS — R739 Hyperglycemia, unspecified: Secondary | ICD-10-CM | POA: Diagnosis not present

## 2017-04-09 DIAGNOSIS — M8660 Other chronic osteomyelitis, unspecified site: Secondary | ICD-10-CM | POA: Diagnosis not present

## 2017-04-09 DIAGNOSIS — J4531 Mild persistent asthma with (acute) exacerbation: Secondary | ICD-10-CM | POA: Diagnosis not present

## 2017-04-09 DIAGNOSIS — H9319 Tinnitus, unspecified ear: Secondary | ICD-10-CM | POA: Diagnosis not present

## 2017-04-09 DIAGNOSIS — Z930 Tracheostomy status: Secondary | ICD-10-CM | POA: Diagnosis not present

## 2017-04-09 DIAGNOSIS — C154 Malignant neoplasm of middle third of esophagus: Secondary | ICD-10-CM | POA: Diagnosis not present

## 2017-04-09 DIAGNOSIS — R609 Edema, unspecified: Secondary | ICD-10-CM | POA: Diagnosis not present

## 2017-04-09 DIAGNOSIS — C153 Malignant neoplasm of upper third of esophagus: Secondary | ICD-10-CM | POA: Diagnosis not present

## 2017-04-30 DIAGNOSIS — J4531 Mild persistent asthma with (acute) exacerbation: Secondary | ICD-10-CM | POA: Diagnosis not present

## 2017-05-01 ENCOUNTER — Emergency Department (HOSPITAL_COMMUNITY): Payer: Medicare Other

## 2017-05-01 ENCOUNTER — Encounter (HOSPITAL_COMMUNITY): Payer: Self-pay | Admitting: *Deleted

## 2017-05-01 ENCOUNTER — Emergency Department (HOSPITAL_COMMUNITY)
Admission: EM | Admit: 2017-05-01 | Discharge: 2017-05-01 | Disposition: A | Discharge status: Home / Self Care | Payer: Medicare Other | Attending: Emergency Medicine | Admitting: Emergency Medicine

## 2017-05-01 ENCOUNTER — Other Ambulatory Visit: Payer: Self-pay

## 2017-05-01 DIAGNOSIS — E119 Type 2 diabetes mellitus without complications: Secondary | ICD-10-CM | POA: Insufficient documentation

## 2017-05-01 DIAGNOSIS — Z930 Tracheostomy status: Secondary | ICD-10-CM | POA: Insufficient documentation

## 2017-05-01 DIAGNOSIS — Z79899 Other long term (current) drug therapy: Secondary | ICD-10-CM | POA: Diagnosis not present

## 2017-05-01 DIAGNOSIS — Y828 Other medical devices associated with adverse incidents: Secondary | ICD-10-CM | POA: Insufficient documentation

## 2017-05-01 DIAGNOSIS — T85528A Displacement of other gastrointestinal prosthetic devices, implants and grafts, initial encounter: Secondary | ICD-10-CM | POA: Insufficient documentation

## 2017-05-01 DIAGNOSIS — K9423 Gastrostomy malfunction: Secondary | ICD-10-CM | POA: Diagnosis not present

## 2017-05-01 DIAGNOSIS — Z431 Encounter for attention to gastrostomy: Secondary | ICD-10-CM

## 2017-05-01 DIAGNOSIS — Z931 Gastrostomy status: Secondary | ICD-10-CM | POA: Diagnosis not present

## 2017-05-01 DIAGNOSIS — Z87891 Personal history of nicotine dependence: Secondary | ICD-10-CM | POA: Diagnosis not present

## 2017-05-01 DIAGNOSIS — I10 Essential (primary) hypertension: Secondary | ICD-10-CM | POA: Insufficient documentation

## 2017-05-01 MED ORDER — IOPAMIDOL (ISOVUE-300) INJECTION 61%
INTRAVENOUS | Status: AC
  Administered 2017-05-01: 50 mL via GASTROSTOMY
  Filled 2017-05-01: qty 50

## 2017-05-01 NOTE — ED Provider Notes (Signed)
[facility] EMERGENCY DEPARTMENT Provider Note   CSN: [id number] Arrival date & time: 05/01/17  7412     History   Chief Complaint No chief complaint on file.   HPI Nicholas Cobb is a 67 y.o. male.  Patient with hx trach and feeding tube, presents indicating his g tube got accidentally pulled out around 0430 this AM. Had been in, and periodically changed, for years. Denies abd pain. No vomiting. No fever or chills.    The history is provided by the patient. History limited by: patient communicates by writing.    Past Medical History:  Diagnosis Date  . Allergy   . Cancer ([place]) 1999   throat cancer  . Cervical spondylolysis 04/30/2011   severe  . Cervical spondylosis 03/25/2011  . Change in voice   . Chronic pain 03/25/2011  . Chronic sinusitis 04/27/2011  . Depression   . Difficulty urinating   . Emphysema 03/25/2011  . Essential hypertension, benign 12/03/2012  . GERD (gastroesophageal reflux disease) 03/25/2011  . Hearing loss   . Impaired glucose tolerance 04/27/2011  . 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 Rash   . Trouble swallowing   . Type II or unspecified type diabetes mellitus without mention of complication, uncontrolled 04/27/2011  . Ulcer   . UTI (lower urinary tract infection)   . Xerostomia 03/25/2011    Patient Active Problem List   Diagnosis Date Noted  . Hypoxia   . Tracheostomy malfunction ([place]) 03/01/2017  . Trachea displaced 12/29/2016  . Other tracheostomy complication ([place]) 87/86/7672  . Radionecrosis   . Tracheostomy care ([place])   . Hypoxemia   . Hemoptysis 01/26/2016  . Throat cancer ([place])   . Controlled diabetes mellitus type 2 with complications ([place])   . Acute on chronic respiratory failure with hypoxia ([place]) 06/14/2015  . Respiratory failure ([place]) 06/13/2015  . Tracheostomy dependence ([place])   . Cellulitis and abscess of head   . Cellulitis of face   . Tracheostomy status ([place])   . Acute sinus infection 10/29/2013    . Rash 10/29/2013  . Hyponatremia 12/03/2012  . Essential hypertension 12/03/2012  . Cervical spondylolysis 04/30/2011  . Chronic sinusitis 04/27/2011  . Diabetes 04/27/2011  . Erectile dysfunction 04/19/2011  . Preventative health care 03/25/2011  . Cervical spondylosis 03/25/2011  . Pulmonary emphysema ([place]) 03/25/2011  . GERD (gastroesophageal reflux disease) 03/25/2011  . Chronic pain 03/25/2011  . 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 Xerostomia 03/25/2011  . Tracheostomy in place [facility]) 02/04/2011  . Laryngeal cancer ([place]) 02/04/2011  . Encounter for PEG (percutaneous endoscopic gastrostomy) ([place]) 02/04/2011  . Attention to G-tube [facility]) 11/07/2010    Past Surgical History:  Procedure Laterality Date  . GASTROSTOMY TUBE PLACEMENT    . IR GENERIC HISTORICAL  04/03/2016   IR CM INJ ANY COLONIC TUBE W/FLUORO 04/03/2016 [name], [name]  . PEG TUBE PLACEMENT  11/1999  . TRACHEOSTOMY  11/1999  . TRACHEOSTOMY TUBE PLACEMENT  11/24/2016   Procedure: EMERGENT TRACHEOSTOMY CHANGE;  Surgeon: Jerrell Belfast, MD;  Location: [facility];  Service: ENT;;  . TRACHEOSTOMY TUBE PLACEMENT N/A 12/29/2016   Procedure: TRACHEOSTOMY;  Surgeon: Melissa Montane, MD;  Location: [facility];  Service: ENT;  Laterality: N/A;       Home Medications    Prior to Admission medications   Medication Sig Start Date End Date Taking? Authorizing Provider  albuterol (PROVENTIL HFA;VENTOLIN HFA) 108 (90 Base) MCG/ACT inhaler Inhale 2 puffs into the lungs every 4 (  four) hours as needed for wheezing or shortness of breath. 03/09/16   [name], NP  AMBULATORY NON FORMULARY MEDICATION Trach(Shiley 6.0 XLT UP uncuffed), Trach Care Kit, Trach holders, Disposable inner cannulas 07/08/15   Gildardo Cranker, DO  betamethasone dipropionate 0.05 % lotion Apply topically 2 (two) times daily. To rash on leg Patient not taking: Reported on 03/05/2017 10/20/15   Gildardo Cranker, DO  IBU 400 MG tablet take 1 tablet  by mouth three times a day if needed for pain Patient taking differently: Take 400 mg by mouth three times a day as needed for pain 01/17/17   [name], NP  ipratropium-albuterol (DUONEB) 0.5-2.5 (3) MG/3ML SOLN Take 3 mLs by nebulization every 6 (six) hours as needed. Patient taking differently: Take 3 mLs by nebulization every 6 (six) hours as needed (for wheezing or shortness of breath).  08/01/16   Robyn Haber, MD  losartan (COZAAR) 100 MG tablet Place 1 tablet (100 mg total) into feeding tube daily. 11/27/16   Cherene Altes, MD  naphazoline-pheniramine (NAPHCON-A) 0.025-0.3 % ophthalmic solution Place 2 drops into both eyes 4 (four) times daily as needed for eye irritation or allergies.     [provider]  Nutritional Supplements (FEEDING SUPPLEMENT, JEVITY 1.2 CAL,) LIQD Place 474 mLs into feeding tube every 6 (six) hours. 11/27/16   Cherene Altes, MD  oxymetazoline (12 HOUR NASAL SPRAY) 0.05 % nasal spray Place 2 sprays into the nose 2 (two) times daily as needed for congestion.     [provider]  ranitidine (ZANTAC) 150 MG tablet Take 150 mg per tube two times a day 11/27/16   Cherene Altes, MD  triamcinolone cream (KENALOG) 0.1 % Apply 1 application topically 2 (two) times daily. APPLY TO AFFECTED AREAS 11/27/16   Cherene Altes, MD    Family History Family History  Problem Relation Age of Onset  . Diabetes Mother   . Heart disease Father   . Stroke Other   . Diabetes Other   . Cancer Other        lung cancer    Social History Social History   Tobacco Use  . Smoking status: Former Smoker    Packs/day: 1.00    Years: 23.00    Pack years: 23.00    Last attempt to quit: 11/06/1996    Years since quitting: 20.4  . Smokeless tobacco: Never Used  Substance Use Topics  . Alcohol use: No    Alcohol/week: 0.0 oz  . Drug use: No     Allergies   Oxybutynin chloride and Codeine   Review of Systems Review of Systems   Constitutional: Negative for fever.  Gastrointestinal: Negative for abdominal pain and vomiting.  Genitourinary: Negative for flank pain.  Musculoskeletal: Negative for back pain.     Physical Exam Updated Vital Signs BP (!) 171/101 (BP Location: Right Arm)   Pulse 86   Resp (!) 22   SpO2 96%   Physical Exam  Constitutional: He appears well-developed and well-nourished. No distress.  HENT:  Head: Atraumatic.  Eyes: Conjunctivae are normal.  Neck: Neck supple. No tracheal deviation present.  Cardiovascular: Normal rate.  Pulmonary/Chest: Effort normal and breath sounds normal. No accessory muscle usage. No respiratory distress.  Abdominal: Soft. He exhibits no distension. There is no tenderness.  g tube site with small amount bleeding and drainage of gi contents around site and onto dressing.   Musculoskeletal: He exhibits no edema.  Neurological: He is alert.  Skin: Skin is warm and dry. He is not diaphoretic.  Psychiatric: He has a normal mood and affect.  Nursing note and vitals reviewed.    ED Treatments / Results  Labs (all labs ordered are listed, but only abnormal results are displayed) Labs Reviewed - No data to display  EKG  EKG Interpretation None       Radiology Dg Abd 1 View  Result Date: 05/01/2017 CLINICAL DATA:  Status post gastrostomy tube placement. EXAM: ABDOMEN - 1 VIEW COMPARISON:  None. FINDINGS: Contrast injected into the patient's gastrostomy tube fills the stomach. Bowel gas pattern is normal. IMPRESSION: Gastrostomy tube in good position. Electronically Signed   By: Inge Rise M.D.   On: 05/01/2017 11:46    Procedures Procedures (including critical care time)  Medications Ordered in ED Medications - No data to display   Initial Impression / Assessment and Plan / ED Course  I have reviewed the triage vital signs and the nursing notes.  Pertinent labs & imaging results that were available during my care of the patient were  reviewed by me and considered in my medical decision making (see chart for details).  No definitive g tube stocked in ED, therefore to prevent from closing while definitive tube sent for, 24 fr (same size as old tube that patient has with him) was placed using sterile technique to keep site/tract patent.   Reviewed nursing notes and prior charts for additional history.   xrays reviewed -  Tube appears in satisfactory position.   Patient appears stable for d/c.     Final Clinical Impressions(s) / ED Diagnoses   Final diagnoses:  None    ED Discharge Orders    None       Lajean Saver, MD 05/01/17 1156

## 2017-05-01 NOTE — ED Notes (Signed)
Got patient into a gown patient is resting

## 2017-05-01 NOTE — ED Triage Notes (Signed)
Pt is nonverbal due to hx and trach. Pt wrote that peg tube came out around 0430 and is due for replacement. No acute distress is noted at triage.

## 2017-05-01 NOTE — ED Notes (Signed)
Patient transported to X-ray via wheelchair 

## 2017-05-01 NOTE — Discharge Instructions (Addendum)
It was our pleasure to provide your ER care today - we hope that you feel better.  Your blood pressure is high today - continue your medication, and follow up with primary are doctor in 1 week.   Return to ER if worse, new symptoms, other medical emergency.

## 2017-05-03 DIAGNOSIS — K9423 Gastrostomy malfunction: Secondary | ICD-10-CM | POA: Diagnosis not present

## 2017-05-07 DIAGNOSIS — C154 Malignant neoplasm of middle third of esophagus: Secondary | ICD-10-CM | POA: Diagnosis not present

## 2017-05-07 DIAGNOSIS — C153 Malignant neoplasm of upper third of esophagus: Secondary | ICD-10-CM | POA: Diagnosis not present

## 2017-05-07 DIAGNOSIS — M8660 Other chronic osteomyelitis, unspecified site: Secondary | ICD-10-CM | POA: Diagnosis not present

## 2017-05-07 DIAGNOSIS — H9319 Tinnitus, unspecified ear: Secondary | ICD-10-CM | POA: Diagnosis not present

## 2017-05-07 DIAGNOSIS — Z930 Tracheostomy status: Secondary | ICD-10-CM | POA: Diagnosis not present

## 2017-05-07 DIAGNOSIS — R609 Edema, unspecified: Secondary | ICD-10-CM | POA: Diagnosis not present

## 2017-05-10 DIAGNOSIS — C153 Malignant neoplasm of upper third of esophagus: Secondary | ICD-10-CM | POA: Diagnosis not present

## 2017-05-16 ENCOUNTER — Ambulatory Visit (HOSPITAL_COMMUNITY)
Admit: 2017-05-16 | Discharge: 2017-05-16 | Disposition: A | Discharge status: Home / Self Care | Payer: Medicare Other | Attending: Acute Care | Admitting: Acute Care

## 2017-05-16 DIAGNOSIS — Z4682 Encounter for fitting and adjustment of non-vascular catheter: Secondary | ICD-10-CM | POA: Insufficient documentation

## 2017-05-16 DIAGNOSIS — Z930 Tracheostomy status: Secondary | ICD-10-CM | POA: Insufficient documentation

## 2017-05-16 DIAGNOSIS — Z923 Personal history of irradiation: Secondary | ICD-10-CM | POA: Insufficient documentation

## 2017-05-16 DIAGNOSIS — I890 Lymphedema, not elsewhere classified: Secondary | ICD-10-CM | POA: Diagnosis not present

## 2017-05-16 NOTE — Progress Notes (Signed)
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  He has associated severe lymphedema, neck contraction, and swelling due to this.  His tracheostomy is always difficult to change.  He has occasional emergency room visits due to tracheostomy becoming dislodged.  He presents today for routine tracheostomy change.  ROS Essentially negative.  He has run out of sterile water making tracheostomy changes more challenging Vital signs: Hr 82 bp 129/59 rr 24 bp 129/59 sats 95%   Exam: General: This is an ambulatory 67 year old male patient who communicates via written word he is nonverbal and has been this way for the better part of one year due to severe swelling and lymphedema of his face and mandibular area HEENT: His neck is contracted, he has marked lymphedema involving his entire mandibular area, bilateral periorbital swelling, this is essentially his baseline for the last 4-6 months.  The tracheostomy site is difficult to reach, requires bronchoscopy in order to Place tracheostomy Pulmonary: Clear to auscultation Cardiac: Regular rate and rhythm Extremities: No edema Neuro without focal deficits Trach change/procedure #6 XLT proximal trach. The current tracheostomy was removed, as usual this was with some difficulty given his mandibular swelling.  At that point a new #6 XLT was placed over bronchoscopy with placement verified bronchoscopically.  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 Over the last few months we have overcome  this using the bronchoscope to slide the trach over (can't be done over standard obturator. This has become harder w/ significant trauma given his lymphedema. Looking at his neck size I actually think he would be better off w/ a standard size trach but I don't think we can get that in him due to his deformity. Another option would be a softer silicone portex trach BUT these require frequent changes, have no inner cannula and in my experience tend to not be tolerated well due to how frequent they need to be changed. It's been a year since he's been followed at [facility]. His initial trach care was there and he really needs to be followed there more frequently. During his last hospital admit the concern for new possible neoplasm was raised (Quan elected to not pursue biopsy).  I think he needs to go back; not sure that they can offer anything else but we are quickly coming to a point where I may not be able to meet his needs.   Plan Cont current trach care Keep # 6 prox XLT for now.  I've provided him with the appointment # at [facility] and instructed him that when he can get with the person who is his transportation that he make an appointment. He as seen Dr Haskel Khan in the past. If nothing can be done about the lymphedema and we are left with what we have then we can consider portex trach w/ q 4-5 week follow up. This may be a little easier to maintain and replace. I'll have my staff order a size 7 for this.     Visit time: 50 minutes.   [name] ACNP-BC [place]

## 2017-05-16 NOTE — Progress Notes (Signed)
Tracheostomy Procedure Note  LONZA SHIMABUKURO [id number] 04/02/1950  Pre Procedure Tracheostomy Information  Trach Brand: Shiley Size: 6.0 Style: Proximal and Uncuffed Secured by: Velcro   Procedure: trach change    Post Procedure Tracheostomy Information  Trach Brand: Shiley Size: 6.0 Style: Proximal and Uncuffed Secured by: Velcro   Post Procedure Evaluation:  ETCO2 positive color change from yellow to purple : Yes.   Vital signs:blood pressure 129/59, pulse 82, respirations 24 and pulse oximetry 95 % Patients current condition: stable Complications: No apparent complications Trach site exam: clean, dry Wound care done: dry, clean and 4 x 4 gauze Patient did tolerate procedure well.   Education: none  Prescription needs: none    Additional needs: Changed trach with disposable bronchoscope to aid in proper placement.  Patient tolerated well.

## 2017-05-21 ENCOUNTER — Encounter (HOSPITAL_COMMUNITY): Payer: Self-pay | Admitting: Family Medicine

## 2017-05-21 ENCOUNTER — Ambulatory Visit (HOSPITAL_COMMUNITY)
Admission: EM | Admit: 2017-05-21 | Discharge: 2017-05-21 | Disposition: A | Discharge status: Home / Self Care | Payer: Medicare Other | Attending: Family Medicine | Admitting: Family Medicine

## 2017-05-21 DIAGNOSIS — J4540 Moderate persistent asthma, uncomplicated: Secondary | ICD-10-CM

## 2017-05-21 MED ORDER — ALBUTEROL SULFATE HFA 108 (90 BASE) MCG/ACT IN AERS
2.0000 | INHALATION_SPRAY | Freq: Once | RESPIRATORY_TRACT | Status: AC
  Administered 2017-05-21: 2 via RESPIRATORY_TRACT

## 2017-05-21 MED ORDER — ALBUTEROL SULFATE HFA 108 (90 BASE) MCG/ACT IN AERS
INHALATION_SPRAY | RESPIRATORY_TRACT | Status: AC
  Filled 2017-05-21: qty 6.7

## 2017-05-21 MED ORDER — IPRATROPIUM-ALBUTEROL 0.5-2.5 (3) MG/3ML IN SOLN
RESPIRATORY_TRACT | Status: AC
  Filled 2017-05-21: qty 3

## 2017-05-21 MED ORDER — AEROCHAMBER PLUS FLO-VU LARGE MISC
Status: AC
  Filled 2017-05-21: qty 1

## 2017-05-21 NOTE — ED Triage Notes (Signed)
Pt here for breathing treatment. Pt has trach and is SOB. Pt unable to talk and writing everything. He reports needs an inhaler also.

## 2017-05-21 NOTE — Discharge Instructions (Addendum)
We have contacted [employer] about replacing your nebulizer machine and have faxed the papers to them.

## 2017-05-21 NOTE — ED Provider Notes (Signed)
[place]   [id number] 05/21/17 Arrival Time: 5852  ASSESSMENT & PLAN:  1. Moderate persistent asthma, unspecified whether complicated    Nebulizer treatment needed: yes. Improvement: yes; reports feeling better.  New nebulizer given to Mr. Gonyea today to replace his broken one.  Asthma precautions given. OTC symptom care as needed. May f/u with PCP or here as needed.  Reviewed expectations re: course of current medical issues. Questions answered. Outlined signs and symptoms indicating need for more acute intervention. Patient verbalized understanding. After Visit Summary given.  SUBJECTIVE: History from: patient. Communicates via pen and paper. Non-verbal.  Nicholas Cobb is a 67 y.o. male who presents with complaint of intermittent wheezing. Reports that his nebulizer machine broke this week. Usually uses nebs 3-4 times a day for comfort. Complicated PMH; see chart. Some wheezing now. Here to see if we can give him a neb and help him acquire a new machine.   Social History   Tobacco Use  Smoking Status Former Smoker  . Packs/day: 1.00  . Years: 23.00  . Pack years: 23.00  . Last attempt to quit: 11/06/1996  . Years since quitting: 20.5  Smokeless Tobacco Never Used    ROS: As per HPI.   OBJECTIVE:  Vitals:   05/21/17 1246  BP: (!) 135/111  Pulse: 84  Resp: (!) 24  Temp: 98.4 F (36.9 C)  SpO2: 95%   General appearance: alert HEENT: secretions from mouth; tracheostomy appears clear Neck: supple without LAD Lungs: unlabored respirations, moderate bilateral wheezing; cough: absent; no significant respiratory distress Skin: warm and dry Psychological: alert and cooperative; normal mood and affect  Imaging: No results found.  Allergies  Allergen Reactions  . Oxybutynin Chloride Nausea And Vomiting  . Codeine Nausea And Vomiting and Rash    Past Medical History:  Diagnosis Date  . Allergy   . Cancer ([place]) 1999   throat cancer  .  Cervical spondylolysis 04/30/2011   severe  . Cervical spondylosis 03/25/2011  . Change in voice   . Chronic pain 03/25/2011  . Chronic sinusitis 04/27/2011  . Depression   . Difficulty urinating   . Emphysema 03/25/2011  . Essential hypertension, benign 12/03/2012  . GERD (gastroesophageal reflux disease) 03/25/2011  . Hearing loss   . Impaired glucose tolerance 04/27/2011  . 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 Rash   . Trouble swallowing   . Type II or unspecified type diabetes mellitus without mention of complication, uncontrolled 04/27/2011  . Ulcer   . UTI (lower urinary tract infection)   . Xerostomia 03/25/2011   Family History  Problem Relation Age of Onset  . Diabetes Mother   . Heart disease Father   . Stroke Other   . Diabetes Other   . Cancer Other        lung cancer   Social History   Socioeconomic History  . Marital status: Single    Spouse name: Not on file  . Number of children: Not on file  . Years of education: 39  . Highest education level: Not on file  Occupational History  . Occupation: retired    Fish farm manager: DISABLED  Social Needs  . Financial resource strain: Not on file  . Food insecurity:    Worry: Not on file    Inability: Not on file  . Transportation needs:    Medical: Not on file    Non-medical: Not on file  Tobacco Use  . Smoking status: Former Smoker    Packs/day: 1.00  Years: 23.00    Pack years: 23.00    Last attempt to quit: 11/06/1996    Years since quitting: 20.5  . Smokeless tobacco: Never Used  Substance and Sexual Activity  . Alcohol use: No    Alcohol/week: 0.0 oz  . Drug use: No  . Sexual activity: Not Currently  Lifestyle  . Physical activity:    Days per week: Not on file    Minutes per session: Not on file  . Stress: Not on file  Relationships  . Social connections:    Talks on phone: Not on file    Gets together: Not on file    Attends religious service: Not on file    Active member of club or organization: Not on  file    Attends meetings of clubs or organizations: Not on file    Relationship status: Not on file  . Intimate partner violence:    Fear of current or ex partner: Not on file    Emotionally abused: Not on file    Physically abused: Not on file    Forced sexual activity: Not on file  Other Topics Concern  . Not on file  Social History Narrative   Pt. Lives in a apartment, 1 story, 2 people live in the home, No pet's. Pt. Does exercise (walk) and yes pt. Has a living will.        Vanessa Kick, MD 05/23/17 [phone]

## 2017-05-21 NOTE — ED Notes (Signed)
Staff are supplying nebulizer machine for patient for home use. Patient with limited income. Unable to find resources through social work.case management.

## 2017-06-06 DIAGNOSIS — M8708 Idiopathic aseptic necrosis of bone, other site: Secondary | ICD-10-CM | POA: Diagnosis not present

## 2017-06-06 DIAGNOSIS — R0609 Other forms of dyspnea: Secondary | ICD-10-CM | POA: Diagnosis not present

## 2017-06-06 DIAGNOSIS — J4520 Mild intermittent asthma, uncomplicated: Secondary | ICD-10-CM | POA: Diagnosis not present

## 2017-06-06 DIAGNOSIS — I10 Essential (primary) hypertension: Secondary | ICD-10-CM | POA: Diagnosis not present

## 2017-06-07 DIAGNOSIS — C153 Malignant neoplasm of upper third of esophagus: Secondary | ICD-10-CM | POA: Diagnosis not present

## 2017-06-07 DIAGNOSIS — M8660 Other chronic osteomyelitis, unspecified site: Secondary | ICD-10-CM | POA: Diagnosis not present

## 2017-06-07 DIAGNOSIS — H9319 Tinnitus, unspecified ear: Secondary | ICD-10-CM | POA: Diagnosis not present

## 2017-06-07 DIAGNOSIS — C154 Malignant neoplasm of middle third of esophagus: Secondary | ICD-10-CM | POA: Diagnosis not present

## 2017-06-07 DIAGNOSIS — R609 Edema, unspecified: Secondary | ICD-10-CM | POA: Diagnosis not present

## 2017-06-07 DIAGNOSIS — Z930 Tracheostomy status: Secondary | ICD-10-CM | POA: Diagnosis not present

## 2017-06-10 DIAGNOSIS — C153 Malignant neoplasm of upper third of esophagus: Secondary | ICD-10-CM | POA: Diagnosis not present

## 2017-06-11 DIAGNOSIS — J4531 Mild persistent asthma with (acute) exacerbation: Secondary | ICD-10-CM | POA: Diagnosis not present

## 2017-06-12 ENCOUNTER — Encounter (HOSPITAL_COMMUNITY): Payer: Self-pay | Admitting: Emergency Medicine

## 2017-06-12 ENCOUNTER — Ambulatory Visit (HOSPITAL_COMMUNITY)
Admission: EM | Admit: 2017-06-12 | Discharge: 2017-06-12 | Disposition: A | Discharge status: Home / Self Care | Payer: Medicare Other | Attending: Emergency Medicine | Admitting: Emergency Medicine

## 2017-06-12 DIAGNOSIS — Z760 Encounter for issue of repeat prescription: Secondary | ICD-10-CM | POA: Diagnosis not present

## 2017-06-12 DIAGNOSIS — J439 Emphysema, unspecified: Secondary | ICD-10-CM | POA: Diagnosis not present

## 2017-06-12 MED ORDER — IPRATROPIUM-ALBUTEROL 0.5-2.5 (3) MG/3ML IN SOLN
RESPIRATORY_TRACT | Status: AC
  Filled 2017-06-12: qty 3

## 2017-06-12 MED ORDER — IPRATROPIUM-ALBUTEROL 0.5-2.5 (3) MG/3ML IN SOLN
3.0000 mL | Freq: Once | RESPIRATORY_TRACT | Status: AC
  Administered 2017-06-12: 3 mL via RESPIRATORY_TRACT

## 2017-06-12 NOTE — ED Triage Notes (Signed)
Pt here 2 weeks ago and we helped him get neb machine; pt sts his [facility] is no longer sending medication for machine and he is out; pt needs RX for nebulizer; pt with trach from angio edema

## 2017-06-12 NOTE — ED Provider Notes (Signed)
HPI  SUBJECTIVE:  Nicholas Cobb is a 67 y.o. male who presents for medication refill.  States that he was here several weeks ago needing a new nebulizer machine, which he got, but he states that he did not get a refill of his DuoNeb medication and is requesting more of this.  He is [facility] that [facility] [place]. [address].  States that he gets all of his medications from this pharmacy, and not from [facility].  States that he is no longer able to get his medication because he did not get his nebulizer machine from [facility].  Patient communicates via pen and paper.  Patient has a complicated medical history including emphysema.  Takes DuoNeb 3-4 times a day as needed. PMD: Rogers Blocker, MD   Past Medical History:  Diagnosis Date  . Allergy   . Cancer ([place]) 1999   throat cancer  . Cervical spondylolysis 04/30/2011   severe  . Cervical spondylosis 03/25/2011  . Change in voice   . Chronic pain 03/25/2011  . Chronic sinusitis 04/27/2011  . Depression   . Difficulty urinating   . Emphysema 03/25/2011  . Essential hypertension, benign 12/03/2012  . GERD (gastroesophageal reflux disease) 03/25/2011  . Hearing loss   . Impaired glucose tolerance 04/27/2011  . 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 Rash   . Trouble swallowing   . Type II or unspecified type diabetes mellitus without mention of complication, uncontrolled 04/27/2011  . Ulcer   . UTI (lower urinary tract infection)   . Xerostomia 03/25/2011    Past Surgical History:  Procedure Laterality Date  . GASTROSTOMY TUBE PLACEMENT    . IR GENERIC HISTORICAL  04/03/2016   IR CM INJ ANY COLONIC TUBE W/FLUORO 04/03/2016 [name], [name]  . PEG TUBE PLACEMENT  11/1999  . TRACHEOSTOMY  11/1999  . TRACHEOSTOMY TUBE PLACEMENT  11/24/2016   Procedure: EMERGENT TRACHEOSTOMY CHANGE;  Surgeon: Jerrell Belfast, MD;  Location: [facility];  Service: ENT;;  . TRACHEOSTOMY TUBE PLACEMENT N/A 12/29/2016   Procedure:  TRACHEOSTOMY;  Surgeon: Melissa Montane, MD;  [facility];  Service: ENT;  Laterality: N/A;    Family History  Problem Relation Age of Onset  . Diabetes Mother   . Heart disease Father   . Stroke Other   . Diabetes Other   . Cancer Other        lung cancer    Social History   Tobacco Use  . Smoking status: Former Smoker    Packs/day: 1.00    Years: 23.00    Pack years: 23.00    Last attempt to quit: 11/06/1996    Years since quitting: 20.6  . Smokeless tobacco: Never Used  Substance Use Topics  . Alcohol use: No    Alcohol/week: 0.0 oz  . Drug use: No    No current facility-administered medications for this encounter.   Current Outpatient Medications:  .  albuterol (PROVENTIL HFA;VENTOLIN HFA) 108 (90 Base) MCG/ACT inhaler, Inhale 2 puffs into the lungs every 4 (four) hours as needed for wheezing or shortness of breath., Disp: 1 Inhaler, Rfl: 0 .  AMBULATORY NON FORMULARY MEDICATION, Trach(Shiley 6.0 XLT UP uncuffed), Trach Care Kit, Trach holders, Disposable inner cannulas, Disp: 1 each, Rfl: 0 .  betamethasone dipropionate 0.05 % lotion, Apply topically 2 (two) times daily. To rash on leg (Patient not taking: Reported on 03/05/2017), Disp: 60 mL, Rfl: 2 .  IBU 400 MG tablet, take 1  tablet by mouth three times a day if needed for pain (Patient taking differently: Take 400 mg by mouth three times a day as needed for pain), Disp: 90 tablet, Rfl: 3 .  ipratropium-albuterol (DUONEB) 0.5-2.5 (3) MG/3ML SOLN, Take 3 mLs by nebulization every 6 (six) hours as needed. (Patient taking differently: Take 3 mLs by nebulization every 6 (six) hours as needed (for wheezing or shortness of breath). ), Disp: 360 mL, Rfl: 1 .  losartan (COZAAR) 100 MG tablet, Place 1 tablet (100 mg total) into feeding tube daily., Disp: , Rfl:  .  naphazoline-pheniramine (NAPHCON-A) 0.025-0.3 % ophthalmic solution, Place 2 drops into both eyes 4 (four) times daily as needed for eye irritation or allergies. , Disp:  , Rfl:  .  Nutritional Supplements (FEEDING SUPPLEMENT, JEVITY 1.2 CAL,) LIQD, Place 474 mLs into feeding tube every 6 (six) hours., Disp: , Rfl:  .  oxymetazoline (12 HOUR NASAL SPRAY) 0.05 % nasal spray, Place 2 sprays into the nose 2 (two) times daily as needed for congestion. , Disp: , Rfl:  .  ranitidine (ZANTAC) 150 MG tablet, Take 150 mg per tube two times a day, Disp: 60 tablet, Rfl: 0 .  triamcinolone cream (KENALOG) 0.1 %, Apply 1 application topically 2 (two) times daily. APPLY TO AFFECTED AREAS, Disp: , Rfl:   Allergies  Allergen Reactions  . Oxybutynin Chloride Nausea And Vomiting  . Codeine Nausea And Vomiting and Rash     ROS  As noted in HPI.   Physical Exam  BP (!) 172/103 (BP Location: Right Arm)   Pulse (!) 108   Temp 100 F (37.8 C) (Oral)   Resp (!) 30   SpO2 95%   Constitutional: Well developed, well nourished, no acute distress Eyes:  EOMI, conjunctiva normal bilaterally HENT: Normocephalic, atraumatic,mucus membranes moist Respiratory: Normal inspiratory effort Cardiovascular: Mild tachycardia GI: nondistended skin: No rash, skin intact Musculoskeletal: no deformities Neurologic: Alert & oriented x 3, no focal neuro deficits Psychiatric: Speech and behavior appropriate   ED Course   Medications  ipratropium-albuterol (DUONEB) 0.5-2.5 (3) MG/3ML nebulizer solution 3 mL (3 mLs Nebulization Given 06/12/17 1143)    No orders of the defined types were placed in this encounter.   No results found for this or any previous visit (from the past 24 hour(s)). No results found.  ED Clinical Impression  Medication refill  Pulmonary emphysema, unspecified emphysema type [facility])   ED Assessment/Plan  Previous records reviewed.  Additional history and medication names obtained.  Vitals reviewed, his baseline oxygen saturation is around 95%.  Patient received a DuoNeb while here, he declined another.  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  Apparently the previous prescription was only a week's worth.  He picked it up on 4/24.  I called in a 2 week prescription of ipratropium/albuterol every 4 hours with 1 refill.   Spent over 25 minutes with the patient talking with him, and on the phone with pharmacy.    Meds ordered this encounter  Medications  . ipratropium-albuterol (DUONEB) 0.5-2.5 (3) MG/3ML nebulizer solution 3 mL    *This clinic note was created using [employer]. Therefore, there may be occasional mistakes despite careful proofreading.   ?   Melynda Ripple, MD 06/12/17 1343

## 2017-06-12 NOTE — Discharge Instructions (Addendum)
I called the [facility] at [place]. [employer]. and have confirmed that you fill your medications there.  I have called in 2 weeks worth of the medicine that you need with 1 refill.  It should be ready by the time you get there.

## 2017-06-30 ENCOUNTER — Encounter (HOSPITAL_COMMUNITY): Payer: Self-pay | Admitting: *Deleted

## 2017-06-30 ENCOUNTER — Emergency Department (HOSPITAL_COMMUNITY): Payer: Medicare Other

## 2017-06-30 ENCOUNTER — Encounter (HOSPITAL_COMMUNITY): Payer: Self-pay | Admitting: Emergency Medicine

## 2017-06-30 ENCOUNTER — Other Ambulatory Visit: Payer: Self-pay

## 2017-06-30 ENCOUNTER — Emergency Department (HOSPITAL_COMMUNITY)
Admission: EM | Admit: 2017-06-30 | Discharge: 2017-06-30 | Disposition: A | Discharge status: Home / Self Care | Payer: Medicare Other | Attending: Emergency Medicine | Admitting: Emergency Medicine

## 2017-06-30 ENCOUNTER — Ambulatory Visit (HOSPITAL_COMMUNITY)
Admission: EM | Admit: 2017-06-30 | Discharge: 2017-06-30 | Disposition: A | Discharge status: Home / Self Care | Payer: Medicare Other | Source: Home / Self Care | Attending: Internal Medicine | Admitting: Internal Medicine

## 2017-06-30 DIAGNOSIS — Z79899 Other long term (current) drug therapy: Secondary | ICD-10-CM | POA: Insufficient documentation

## 2017-06-30 DIAGNOSIS — I10 Essential (primary) hypertension: Secondary | ICD-10-CM | POA: Insufficient documentation

## 2017-06-30 DIAGNOSIS — R05 Cough: Secondary | ICD-10-CM | POA: Diagnosis not present

## 2017-06-30 DIAGNOSIS — R040 Epistaxis: Secondary | ICD-10-CM

## 2017-06-30 DIAGNOSIS — R0602 Shortness of breath: Secondary | ICD-10-CM | POA: Diagnosis not present

## 2017-06-30 DIAGNOSIS — R279 Unspecified lack of coordination: Secondary | ICD-10-CM | POA: Diagnosis not present

## 2017-06-30 DIAGNOSIS — K117 Disturbances of salivary secretion: Secondary | ICD-10-CM | POA: Insufficient documentation

## 2017-06-30 DIAGNOSIS — Z87891 Personal history of nicotine dependence: Secondary | ICD-10-CM | POA: Insufficient documentation

## 2017-06-30 DIAGNOSIS — Z743 Need for continuous supervision: Secondary | ICD-10-CM | POA: Diagnosis not present

## 2017-06-30 DIAGNOSIS — E119 Type 2 diabetes mellitus without complications: Secondary | ICD-10-CM | POA: Diagnosis not present

## 2017-06-30 DIAGNOSIS — J398 Other specified diseases of upper respiratory tract: Secondary | ICD-10-CM

## 2017-06-30 DIAGNOSIS — J9509 Other tracheostomy complication: Secondary | ICD-10-CM | POA: Diagnosis not present

## 2017-06-30 LAB — CBC WITH DIFFERENTIAL/PLATELET
Abs Immature Granulocytes: 0 10*3/uL (ref 0.0–0.1)
BASOS ABS: 0 10*3/uL (ref 0.0–0.1)
BASOS PCT: 1 %
EOS ABS: 0.1 10*3/uL (ref 0.0–0.7)
EOS PCT: 1 %
HCT: 43.3 % (ref 39.0–52.0)
Hemoglobin: 14 g/dL (ref 13.0–17.0)
Immature Granulocytes: 0 %
LYMPHS PCT: 13 %
Lymphs Abs: 0.7 10*3/uL (ref 0.7–4.0)
MCH: 28.4 pg (ref 26.0–34.0)
MCHC: 32.3 g/dL (ref 30.0–36.0)
MCV: 87.8 fL (ref 78.0–100.0)
Monocytes Absolute: 0.8 10*3/uL (ref 0.1–1.0)
Monocytes Relative: 15 %
Neutro Abs: 4 10*3/uL (ref 1.7–7.7)
Neutrophils Relative %: 70 %
PLATELETS: 304 10*3/uL (ref 150–400)
RBC: 4.93 MIL/uL (ref 4.22–5.81)
RDW: 13.2 % (ref 11.5–15.5)
WBC: 5.7 10*3/uL (ref 4.0–10.5)

## 2017-06-30 LAB — BASIC METABOLIC PANEL
Anion gap: 9 (ref 5–15)
BUN: 13 mg/dL (ref 6–20)
CO2: 29 mmol/L (ref 22–32)
CREATININE: 0.92 mg/dL (ref 0.61–1.24)
Calcium: 9.6 mg/dL (ref 8.9–10.3)
Chloride: 93 mmol/L — ABNORMAL LOW (ref 101–111)
GFR calc Af Amer: >60 mL/min (ref >60)
Glucose, Bld: 111 mg/dL — ABNORMAL HIGH (ref 65–99)
POTASSIUM: 4.6 mmol/L (ref 3.5–5.1)
SODIUM: 131 mmol/L — AB (ref 135–145)

## 2017-06-30 MED ORDER — IPRATROPIUM-ALBUTEROL 0.5-2.5 (3) MG/3ML IN SOLN
3.0000 mL | Freq: Once | RESPIRATORY_TRACT | Status: AC
  Administered 2017-06-30: 3 mL via RESPIRATORY_TRACT
  Filled 2017-06-30: qty 3

## 2017-06-30 MED ORDER — OXYMETAZOLINE HCL 0.05 % NA SOLN
1.0000 | Freq: Once | NASAL | Status: DC
  Filled 2017-06-30: qty 15

## 2017-06-30 MED ORDER — OXYMETAZOLINE HCL 0.05 % NA SOLN
NASAL | Status: AC
  Filled 2017-06-30: qty 15

## 2017-06-30 NOTE — ED Provider Notes (Signed)
[facility] EMERGENCY DEPARTMENT Provider Note   CSN: [id number] Arrival date & time: 06/30/17  1729     History   Chief Complaint Chief Complaint  Patient presents with  . Trach problems    HPI Nicholas Cobb is a 67 y.o. male.  Neck cancer and is status post trach.  He had radiation and a lot of complications from it including facial edema and limited movement of his head.  He is complaining of increased shortness of breath, nosebleed from his left nares, asthma, secretions in his mouth and trachea.  It sounds like the nosebleed is been an ongoing issue and he is required ENT in the past.  He has been seen recently for asthma.  He was just at urgent care today for the nosebleed and they were concerned about his respiratory work of breathing and transferred here for evaluation.  Patient is limited in his inability to communicate as he cannot speak and is writing things down.  The history is provided by the patient. The history is limited by a language barrier.  Shortness of Breath  This is a recurrent problem. The problem occurs frequently.Associated symptoms include cough and wheezing. Pertinent negatives include no fever, no headaches, no sore throat, no chest pain, no abdominal pain and no rash. It is unknown what precipitated the problem. He has tried beta-agonist inhalers for the symptoms. The treatment provided mild relief. He has had prior hospitalizations. He has had prior ED visits. Associated medical issues include asthma.    Past Medical History:  Diagnosis Date  . Allergy   . Cancer ([place]) 1999   throat cancer  . Cervical spondylolysis 04/30/2011   severe  . Cervical spondylosis 03/25/2011  . Change in voice   . Chronic pain 03/25/2011  . Chronic sinusitis 04/27/2011  . Depression   . Difficulty urinating   . Emphysema 03/25/2011  . Essential hypertension, benign 12/03/2012  . GERD (gastroesophageal reflux disease) 03/25/2011  . Hearing loss   .  Impaired glucose tolerance 04/27/2011  . 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 Rash   . Trouble swallowing   . Type II or unspecified type diabetes mellitus without mention of complication, uncontrolled 04/27/2011  . Ulcer   . UTI (lower urinary tract infection)   . Xerostomia 03/25/2011    Patient Active Problem List   Diagnosis Date Noted  . Hypoxia   . Tracheostomy malfunction ([place]) 03/01/2017  . Trachea displaced 12/29/2016  . Other tracheostomy complication ([place]) 62/83/1517  . Radionecrosis   . Tracheostomy care ([place])   . Hypoxemia   . Hemoptysis 01/26/2016  . Throat cancer ([place])   . Controlled diabetes mellitus type 2 with complications ([place])   . Acute on chronic respiratory failure with hypoxia ([place]) 06/14/2015  . Respiratory failure ([place]) 06/13/2015  . Tracheostomy dependence ([place])   . Cellulitis and abscess of head   . Cellulitis of face   . Tracheostomy status ([place])   . Acute sinus infection 10/29/2013  . Rash 10/29/2013  . Hyponatremia 12/03/2012  . Essential hypertension 12/03/2012  . Cervical spondylolysis 04/30/2011  . Chronic sinusitis 04/27/2011  . Diabetes 04/27/2011  . Erectile dysfunction 04/19/2011  . Preventative health care 03/25/2011  . Cervical spondylosis 03/25/2011  . Pulmonary emphysema ([place]) 03/25/2011  . GERD (gastroesophageal reflux disease) 03/25/2011  . Chronic pain 03/25/2011  . 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 Xerostomia 03/25/2011  . Tracheostomy in place [facility]) 02/04/2011  . Laryngeal cancer ([place]) 02/04/2011  . Encounter  for PEG (percutaneous endoscopic gastrostomy) ([place]) 02/04/2011  . Attention to G-tube [facility]) 11/07/2010    Past Surgical History:  Procedure Laterality Date  . GASTROSTOMY TUBE PLACEMENT    . IR GENERIC HISTORICAL  04/03/2016   IR CM INJ ANY COLONIC TUBE W/FLUORO 04/03/2016 [name], [name]  . PEG TUBE PLACEMENT  11/1999  . TRACHEOSTOMY  11/1999  . TRACHEOSTOMY TUBE PLACEMENT  11/24/2016    Procedure: EMERGENT TRACHEOSTOMY CHANGE;  Surgeon: Jerrell Belfast, MD;  Location: [facility];  Service: ENT;;  . TRACHEOSTOMY TUBE PLACEMENT N/A 12/29/2016   Procedure: TRACHEOSTOMY;  Surgeon: Melissa Montane, MD;  Location: [facility];  Service: ENT;  Laterality: N/A;        Home Medications    Prior to Admission medications   Medication Sig Start Date End Date Taking? Authorizing Provider  albuterol (PROVENTIL HFA;VENTOLIN HFA) 108 (90 Base) MCG/ACT inhaler Inhale 2 puffs into the lungs every 4 (four) hours as needed for wheezing or shortness of breath. 03/09/16   [name], NP  AMBULATORY NON FORMULARY MEDICATION Trach(Shiley 6.0 XLT UP uncuffed), Trach Care Kit, Trach holders, Disposable inner cannulas 07/08/15   Gildardo Cranker, DO  betamethasone dipropionate 0.05 % lotion Apply topically 2 (two) times daily. To rash on leg Patient not taking: Reported on 03/05/2017 10/20/15   Gildardo Cranker, DO  IBU 400 MG tablet take 1 tablet by mouth three times a day if needed for pain Patient taking differently: Take 400 mg by mouth three times a day as needed for pain 01/17/17   [name], NP  ipratropium-albuterol (DUONEB) 0.5-2.5 (3) MG/3ML SOLN Take 3 mLs by nebulization every 6 (six) hours as needed. Patient taking differently: Take 3 mLs by nebulization every 6 (six) hours as needed (for wheezing or shortness of breath).  08/01/16   Robyn Haber, MD  losartan (COZAAR) 100 MG tablet Place 1 tablet (100 mg total) into feeding tube daily. 11/27/16   Cherene Altes, MD  naphazoline-pheniramine (NAPHCON-A) 0.025-0.3 % ophthalmic solution Place 2 drops into both eyes 4 (four) times daily as needed for eye irritation or allergies.     [provider]  Nutritional Supplements (FEEDING SUPPLEMENT, JEVITY 1.2 CAL,) LIQD Place 474 mLs into feeding tube every 6 (six) hours. 11/27/16   Cherene Altes, MD  oxymetazoline (12 HOUR NASAL SPRAY) 0.05 % nasal spray Place 2 sprays into the  nose 2 (two) times daily as needed for congestion.     [provider]  ranitidine (ZANTAC) 150 MG tablet Take 150 mg per tube two times a day 11/27/16   Cherene Altes, MD  triamcinolone cream (KENALOG) 0.1 % Apply 1 application topically 2 (two) times daily. APPLY TO AFFECTED AREAS 11/27/16   Cherene Altes, MD    Family History Family History  Problem Relation Age of Onset  . Diabetes Mother   . Heart disease Father   . Stroke Other   . Diabetes Other   . Cancer Other        lung cancer    Social History Social History   Tobacco Use  . Smoking status: Former Smoker    Packs/day: 1.00    Years: 23.00    Pack years: 23.00    Last attempt to quit: 11/06/1996    Years since quitting: 20.6  . Smokeless tobacco: Never Used  Substance Use Topics  . Alcohol use: No    Alcohol/week: 0.0 oz  . Drug use: No  Allergies   Oxybutynin chloride and Codeine   Review of Systems Review of Systems  Constitutional: Negative for fever.  HENT: Positive for nosebleeds. Negative for sore throat.   Eyes: Negative for visual disturbance.  Respiratory: Positive for cough, shortness of breath and wheezing.   Cardiovascular: Negative for chest pain.  Gastrointestinal: Negative for abdominal pain.  Genitourinary: Negative for dysuria.  Musculoskeletal: Negative for back pain.  Skin: Negative for rash.  Neurological: Negative for headaches.     Physical Exam Updated Vital Signs BP (!) 169/93 (BP Location: Left Arm)   Pulse 90   Temp 99.2 F (37.3 C) (Axillary)   Resp (!) 38   Ht '5\' 10"'$  (1.778 m)   Wt 72.1 kg (159 lb)   SpO2 96%   BMI 22.81 kg/m   Physical Exam  Constitutional: He appears cachectic. No distress.  HENT:  Head: Atraumatic.  Right Ear: External ear normal.  Left Ear: External ear normal.  Swollen face with edematous eyelids limited range of motion.  He is got a small trickle of bleeding from his left nares.  He is got swollen lips and some  drooling secretions.  His neck is limited in range of motion.  He is got a tracheostomy with an inner cannula in place with some noisy respirations.  Neck:  Limited mobility likely secondary to fibrosis from xrt. Trach in place.   Cardiovascular: Normal rate, regular rhythm, normal heart sounds and intact distal pulses.  Pulmonary/Chest: Stridor (at trach opening) present. Tachypnea noted. He has no wheezes.  Abdominal: Soft. He exhibits no mass. There is no tenderness. There is no guarding.  Musculoskeletal: He exhibits no edema, tenderness or deformity.  Neurological: He is alert. He has normal strength. GCS eye subscore is 4. GCS verbal subscore is 5. GCS motor subscore is 6.  Skin: Skin is warm and dry. Capillary refill takes less than 2 seconds.  Psychiatric: He has a normal mood and affect.     ED Treatments / Results  Labs (all labs ordered are listed, but only abnormal results are displayed) Labs Reviewed  BASIC METABOLIC PANEL - Abnormal; Notable for the following components:      Result Value   Sodium 131 (*)    Chloride 93 (*)    Glucose, Bld 111 (*)    All other components within normal limits  CBC WITH DIFFERENTIAL/PLATELET    EKG None  Radiology Dg Chest Port 1 View  Result Date: 06/30/2017 CLINICAL DATA:  Shortness of Breath EXAM: PORTABLE CHEST 1 VIEW COMPARISON:  03/04/2017 FINDINGS: Bibasilar atelectasis. Heart is normal size. No effusions or acute bony abnormality. IMPRESSION: Bibasilar atelectasis. Electronically Signed   By: Rolm Baptise M.D.   On: 06/30/2017 18:39    Procedures Procedures (including critical care time)  Medications Ordered in ED Medications  ipratropium-albuterol (DUONEB) 0.5-2.5 (3) MG/3ML nebulizer solution 3 mL (has no administration in time range)     Initial Impression / Assessment and Plan / ED Course  I have reviewed the triage vital signs and the nursing notes.  Pertinent labs & imaging results that were available during my  care of the patient were reviewed by me and considered in my medical decision making (see chart for details).  Clinical Course as of Jul 02 1057  Sat Jun 30, 2017  [place] with Dr. Janace Hoard ENT who recommends continuing to try to humidify the oxygen and clean out the cannula.  From the nosebleed standpoint he says we can put up packing  in place if needed.  Ultimately he feels the [facility] if it is deemed that his tracheostomy is the main issue.   [MB]  1918 Respiratory is been working on cleaning out the inner cannula and suctioning the patient both mouth and trachea.  Also using breathing treatments and humidified oxygen.   [MB]  1956 Respiratory as suctioned the patient cleaned out his trach and his respiratory rate is now looking more comfortable in the mid 20s.  His nosebleed is also stopped.  I offered to put a packing into the nose but he declines as the nosebleed is stopped.  He is now asking for blankets in the bed adjusted because of the bothering his back.  Asked him if he felt like he was going to be well enough to be discharged home.   [MB]  2049 Patient continues to keep putting the [place] in his nose and I keep informing him that this probably can aggravate any bleeding that he is having.  I do not feel he needs acute medical admission as he has been doing fine on humidified air with a good sat and less noise out of his trach.   [MB]    Clinical Course User Index [MB] Hayden Rasmussen, MD     Final Clinical Impressions(s) / ED Diagnoses   Final diagnoses:  Increased tracheal secretions  Epistaxis, recurrent    ED Discharge Orders    None       Hayden Rasmussen, MD 07/02/17 1102

## 2017-06-30 NOTE — ED Notes (Signed)
Pt was discharged and [facility] came to help patient get home

## 2017-06-30 NOTE — ED Provider Notes (Signed)
[place]    CSN: [id number] Arrival date & time: 06/30/17  1548     History   Chief Complaint Chief Complaint  Patient presents with  . Epistaxis    HPI Patient communicating mainly via writing-history difficult to obtain PRIEST LOCKRIDGE is a 67 y.o. 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  Patient has had nosebleeds for the past 2 to 3 days, states that has improved approximately 90%, but still has had persistent bleeding, mainly from left nostril and into his mouth.  He states that he has dealt with this off-and-on for a long time as he has had radiation damage.  During visit patient also starts to complain of worsening breathing, states that suction will help.  HPI  Past Medical History:  Diagnosis Date  . Allergy   . Cancer ([place]) 1999   throat cancer  . Cervical spondylolysis 04/30/2011   severe  . Cervical spondylosis 03/25/2011  . Change in voice   . Chronic pain 03/25/2011  . Chronic sinusitis 04/27/2011  . Depression   . Difficulty urinating   . Emphysema 03/25/2011  . Essential hypertension, benign 12/03/2012  . GERD (gastroesophageal reflux disease) 03/25/2011  . Hearing loss   . Impaired glucose tolerance 04/27/2011  . 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 Rash   . Trouble swallowing   . Type II or unspecified type diabetes mellitus without mention of complication, uncontrolled 04/27/2011  . Ulcer   . UTI (lower urinary tract infection)   . Xerostomia 03/25/2011    Patient Active Problem List   Diagnosis Date Noted  . Hypoxia   . Tracheostomy malfunction ([place]) 03/01/2017  . Trachea displaced 12/29/2016  . Other tracheostomy complication ([place]) 54/98/2641  . Radionecrosis   . Tracheostomy care ([place])   . Hypoxemia   . Hemoptysis 01/26/2016  . Throat cancer ([place])   . Controlled diabetes mellitus type 2 with complications ([place])   . Acute on  chronic respiratory failure with hypoxia ([place]) 06/14/2015  . Respiratory failure ([place]) 06/13/2015  . Tracheostomy dependence ([place])   . Cellulitis and abscess of head   . Cellulitis of face   . Tracheostomy status ([place])   . Acute sinus infection 10/29/2013  . Rash 10/29/2013  . Hyponatremia 12/03/2012  . Essential hypertension 12/03/2012  . Cervical spondylolysis 04/30/2011  . Chronic sinusitis 04/27/2011  . Diabetes 04/27/2011  . Erectile dysfunction 04/19/2011  . Preventative health care 03/25/2011  . Cervical spondylosis 03/25/2011  . Pulmonary emphysema ([place]) 03/25/2011  . GERD (gastroesophageal reflux disease) 03/25/2011  . Chronic pain 03/25/2011  . 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 Xerostomia 03/25/2011  . Tracheostomy in place [facility]) 02/04/2011  . Laryngeal cancer ([place]) 02/04/2011  . Encounter for PEG (percutaneous endoscopic gastrostomy) ([place]) 02/04/2011  . Attention to G-tube [facility]) 11/07/2010    Past Surgical History:  Procedure Laterality Date  . GASTROSTOMY TUBE PLACEMENT    . IR GENERIC HISTORICAL  04/03/2016   IR CM INJ ANY COLONIC TUBE W/FLUORO 04/03/2016 [name], [name]  . PEG TUBE PLACEMENT  11/1999  . TRACHEOSTOMY  11/1999  . TRACHEOSTOMY TUBE PLACEMENT  11/24/2016   Procedure: EMERGENT TRACHEOSTOMY CHANGE;  Surgeon: Jerrell Belfast, MD;  Location: [facility];  Service: ENT;;  . TRACHEOSTOMY TUBE PLACEMENT N/A 12/29/2016   Procedure: TRACHEOSTOMY;  Surgeon: Melissa Montane, MD;  Location: [facility];  Service: ENT;  Laterality: N/A;  Home Medications    Prior to Admission medications   Medication Sig Start Date End Date Taking? Authorizing Provider  albuterol (PROVENTIL HFA;VENTOLIN HFA) 108 (90 Base) MCG/ACT inhaler Inhale 2 puffs into the lungs every 4 (four) hours as needed for wheezing or shortness of breath. 03/09/16   [name], NP  AMBULATORY NON FORMULARY MEDICATION Trach(Shiley 6.0 XLT UP uncuffed), Trach Care Kit, Trach  holders, Disposable inner cannulas 07/08/15   Gildardo Cranker, DO  betamethasone dipropionate 0.05 % lotion Apply topically 2 (two) times daily. To rash on leg Patient not taking: Reported on 03/05/2017 10/20/15   Gildardo Cranker, DO  IBU 400 MG tablet take 1 tablet by mouth three times a day if needed for pain Patient taking differently: Take 400 mg by mouth three times a day as needed for pain 01/17/17   [name], NP  ipratropium-albuterol (DUONEB) 0.5-2.5 (3) MG/3ML SOLN Take 3 mLs by nebulization every 6 (six) hours as needed. Patient taking differently: Take 3 mLs by nebulization every 6 (six) hours as needed (for wheezing or shortness of breath).  08/01/16   Robyn Haber, MD  losartan (COZAAR) 100 MG tablet Place 1 tablet (100 mg total) into feeding tube daily. 11/27/16   Cherene Altes, MD  naphazoline-pheniramine (NAPHCON-A) 0.025-0.3 % ophthalmic solution Place 2 drops into both eyes 4 (four) times daily as needed for eye irritation or allergies.     [provider]  Nutritional Supplements (FEEDING SUPPLEMENT, JEVITY 1.2 CAL,) LIQD Place 474 mLs into feeding tube every 6 (six) hours. 11/27/16   Cherene Altes, MD  oxymetazoline (12 HOUR NASAL SPRAY) 0.05 % nasal spray Place 2 sprays into the nose 2 (two) times daily as needed for congestion.     [provider]  ranitidine (ZANTAC) 150 MG tablet Take 150 mg per tube two times a day 11/27/16   Cherene Altes, MD  triamcinolone cream (KENALOG) 0.1 % Apply 1 application topically 2 (two) times daily. APPLY TO AFFECTED AREAS 11/27/16   Cherene Altes, MD    Family History Family History  Problem Relation Age of Onset  . Diabetes Mother   . Heart disease Father   . Stroke Other   . Diabetes Other   . Cancer Other        lung cancer    Social History Social History   Tobacco Use  . Smoking status: Former Smoker    Packs/day: 1.00    Years: 23.00    Pack years: 23.00    Last attempt to  quit: 11/06/1996    Years since quitting: 20.6  . Smokeless tobacco: Never Used  Substance Use Topics  . Alcohol use: No    Alcohol/week: 0.0 oz  . Drug use: No     Allergies   Oxybutynin chloride and Codeine   Review of Systems Review of Systems  Constitutional: Negative for fever.  HENT: Positive for nosebleeds.   Respiratory: Positive for shortness of breath. Negative for cough.   Gastrointestinal: Negative for vomiting.     Physical Exam Triage Vital Signs ED Triage Vitals [06/30/17 1554]  Enc Vitals Group     BP 138/69     Pulse Rate 87     Resp (!) 40     Temp 99.7 F (37.6 C)     Temp Source Oral     SpO2 100 %     Weight      Height      Head Circumference  Peak Flow      Pain Score      Pain Loc      Pain Edu?      Excl. in [place]?    No data found.  Updated Vital Signs BP 138/69 (BP Location: Left Arm)   Pulse 87   Temp 99.7 F (37.6 C) (Oral)   Resp (!) 40   SpO2 100%   Visual Acuity Right Eye Distance:   Left Eye Distance:   Bilateral Distance:    Right Eye Near:   Left Eye Near:    Bilateral Near:     Physical Exam  Constitutional: He appears well-developed and well-nourished.  HENT:  Head: Normocephalic and atraumatic.  Significant periorbital swelling, right eyelid with thick discharge  Active bleeding coming from mouth and left nares, no source visualized  Eyes: Conjunctivae are normal.  Neck: Neck supple.  Cardiovascular: Normal rate and regular rhythm.  No murmur heard. Pulmonary/Chest:  Patient with audible respirations through trach, does become more noisy through visit  Abdominal: Soft. There is no tenderness.  Musculoskeletal: He exhibits no edema.  Neurological: He is alert.  Skin: Skin is warm and dry.  Psychiatric: He has a normal mood and affect.  Nursing note and vitals reviewed.    UC Treatments / Results  Labs (all labs ordered are listed, but only abnormal results are displayed) Labs Reviewed - No data  to display  EKG None  Radiology No results found.  Procedures Procedures (including critical care time)  Medications Ordered in UC Medications - No data to display  Initial Impression / Assessment and Plan / UC Course  I have reviewed the triage vital signs and the nursing notes.  Pertinent labs & imaging results that were available during my care of the patient were reviewed by me and considered in my medical decision making (see chart for details).     Patient with complicated past medical history, difficult communication barriers.  2 squirts of Afrin applied to bilateral nares, without improvement of bleeding.  Patient beginning to complain about breathing after use.  Oxygen saturation fluctuating between 89%-97%.  Patient also with significant facial swelling, although this is appeared to be a chronic issue from his medical chart.  Given length of bleeding and increased difficulty breathing during visit, will have patient further evaluated in emergency room.   Final Clinical Impressions(s) / UC Diagnoses   Final diagnoses:  None   Discharge Instructions   None    ED Prescriptions    None     Controlled Substance Prescriptions [place] Controlled Substance Registry consulted? Not Applicable   Janith Lima, [place] 06/30/17 1736

## 2017-06-30 NOTE — ED Triage Notes (Signed)
Pt to ED with rr of 38 to ED.  Unable to speak (per norm d/t hx of trach) but writes suction mouth and throat.

## 2017-06-30 NOTE — Progress Notes (Signed)
RT called to sx pt. Pt suctioned x 3 per request for moderate amount thick tan secretions. Placed pt on 60% ATC. SPO2 99% RR 22. Pt tolerating well at this time

## 2017-06-30 NOTE — ED Triage Notes (Signed)
Patient has concerns for nose bleed for a few days.  Denies having any trach concerns.

## 2017-06-30 NOTE — Discharge Instructions (Addendum)
Your evaluated in the emergency department for a recurrent nosebleed and some increased secretions through your trach.  Your trach was cleaned out and you were deep suctioned with improvement in your breathing.  You should continue to use humidified air to moisten the secretions and stay well-hydrated.  You will need to contact your ear nose throat doctor at either [facility] or [facility] for reevaluation.

## 2017-07-03 ENCOUNTER — Other Ambulatory Visit: Payer: Self-pay | Admitting: Nurse Practitioner

## 2017-07-04 DIAGNOSIS — R040 Epistaxis: Secondary | ICD-10-CM | POA: Diagnosis not present

## 2017-07-04 DIAGNOSIS — Z930 Tracheostomy status: Secondary | ICD-10-CM | POA: Diagnosis not present

## 2017-07-04 DIAGNOSIS — J4531 Mild persistent asthma with (acute) exacerbation: Secondary | ICD-10-CM | POA: Diagnosis not present

## 2017-07-12 DIAGNOSIS — C153 Malignant neoplasm of upper third of esophagus: Secondary | ICD-10-CM | POA: Diagnosis not present

## 2017-07-13 DIAGNOSIS — H9319 Tinnitus, unspecified ear: Secondary | ICD-10-CM | POA: Diagnosis not present

## 2017-07-13 DIAGNOSIS — C153 Malignant neoplasm of upper third of esophagus: Secondary | ICD-10-CM | POA: Diagnosis not present

## 2017-07-13 DIAGNOSIS — M8660 Other chronic osteomyelitis, unspecified site: Secondary | ICD-10-CM | POA: Diagnosis not present

## 2017-07-13 DIAGNOSIS — C154 Malignant neoplasm of middle third of esophagus: Secondary | ICD-10-CM | POA: Diagnosis not present

## 2017-07-13 DIAGNOSIS — R609 Edema, unspecified: Secondary | ICD-10-CM | POA: Diagnosis not present

## 2017-07-13 DIAGNOSIS — Z930 Tracheostomy status: Secondary | ICD-10-CM | POA: Diagnosis not present

## 2017-07-13 IMAGING — DX DG CHEST 1V PORT
1 series · 1 of 1 positions shown · non-contrast
Comparison: 03/28/2014

CLINICAL DATA: Shortness of breath. Tracheostomy clogged by mucous
plug. History of emphysema, throat cancer, type 2 diabetes. Former
smoker.

EXAM:
PORTABLE CHEST 1 VIEW

[chest ap]
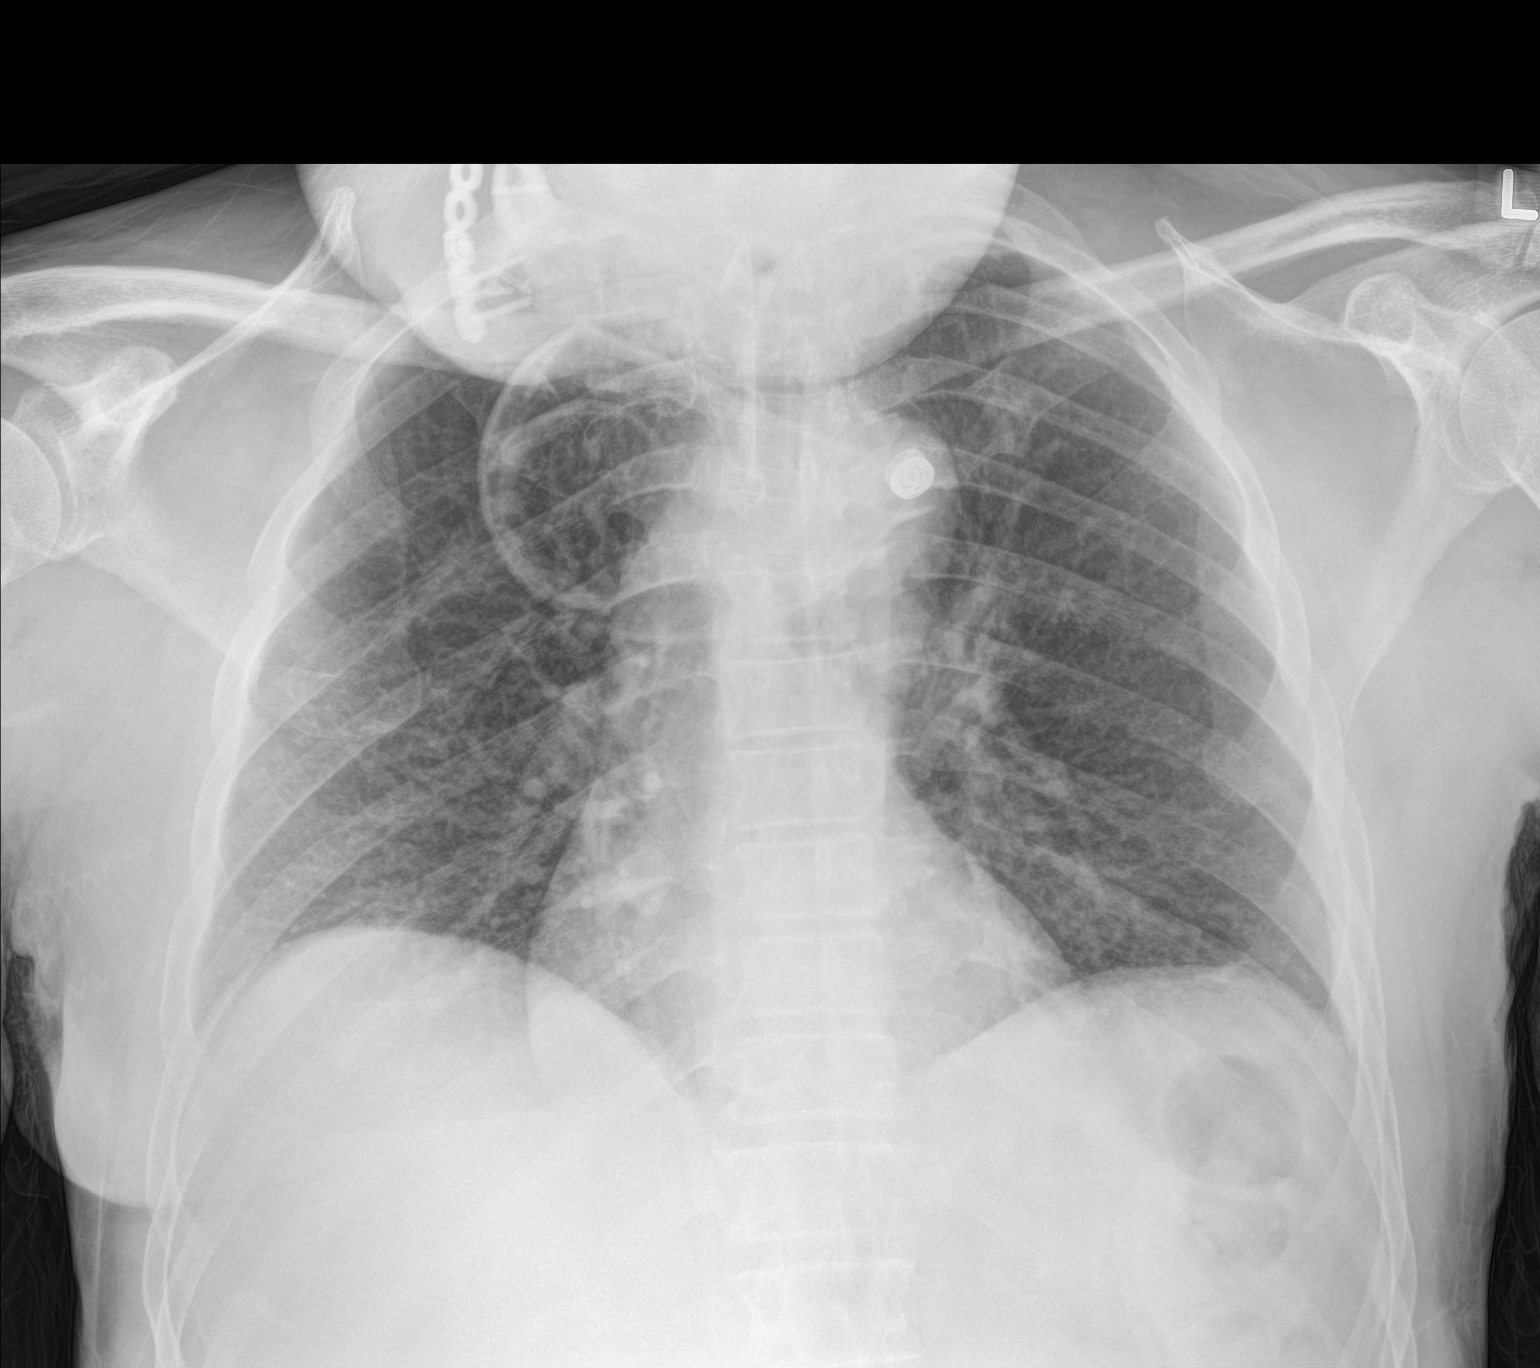

[1 of 1 positions shown; findings below may reference images not displayed]

FINDINGS: Shallow inspiration with linear atelectasis in the lung bases.
Normal heart size. Normal pulmonary vascularity. No focal airspace
disease or consolidation in the lungs. No blunting of costophrenic
angles. No pneumothorax. Tracheostomy tube is present with tip
measuring 3.2 cm above the carina. Incidental note of postoperative
changes with plate and screw fixation of the right mandible.
IMPRESSION: No evidence of active pulmonary disease. Shallow inspiration with
atelectasis in the lung bases.

## 2017-07-26 ENCOUNTER — Encounter (HOSPITAL_COMMUNITY): Payer: Self-pay

## 2017-07-26 ENCOUNTER — Emergency Department (HOSPITAL_COMMUNITY): Payer: Medicare Other

## 2017-07-26 ENCOUNTER — Emergency Department (HOSPITAL_COMMUNITY)
Admission: EM | Admit: 2017-07-26 | Discharge: 2017-07-26 | Disposition: A | Discharge status: Home / Self Care | Payer: Medicare Other | Source: Home / Self Care | Attending: Emergency Medicine | Admitting: Emergency Medicine

## 2017-07-26 ENCOUNTER — Other Ambulatory Visit: Payer: Self-pay

## 2017-07-26 ENCOUNTER — Inpatient Hospital Stay (HOSPITAL_COMMUNITY)
Admission: EM | Admit: 2017-07-26 | Discharge: 2017-07-29 | DRG: 189 | Disposition: A | Discharge status: Home / Self Care | Payer: Medicare Other | Attending: Family Medicine | Admitting: Family Medicine

## 2017-07-26 DIAGNOSIS — Z431 Encounter for attention to gastrostomy: Secondary | ICD-10-CM

## 2017-07-26 DIAGNOSIS — J9621 Acute and chronic respiratory failure with hypoxia: Secondary | ICD-10-CM | POA: Diagnosis not present

## 2017-07-26 DIAGNOSIS — R778 Other specified abnormalities of plasma proteins: Secondary | ICD-10-CM | POA: Diagnosis present

## 2017-07-26 DIAGNOSIS — R7989 Other specified abnormal findings of blood chemistry: Secondary | ICD-10-CM | POA: Diagnosis present

## 2017-07-26 DIAGNOSIS — J189 Pneumonia, unspecified organism: Secondary | ICD-10-CM | POA: Diagnosis not present

## 2017-07-26 DIAGNOSIS — Z931 Gastrostomy status: Secondary | ICD-10-CM

## 2017-07-26 DIAGNOSIS — Z79899 Other long term (current) drug therapy: Secondary | ICD-10-CM

## 2017-07-26 DIAGNOSIS — J9601 Acute respiratory failure with hypoxia: Secondary | ICD-10-CM

## 2017-07-26 DIAGNOSIS — E871 Hypo-osmolality and hyponatremia: Secondary | ICD-10-CM | POA: Diagnosis not present

## 2017-07-26 DIAGNOSIS — K219 Gastro-esophageal reflux disease without esophagitis: Secondary | ICD-10-CM | POA: Diagnosis present

## 2017-07-26 DIAGNOSIS — R0902 Hypoxemia: Secondary | ICD-10-CM | POA: Diagnosis not present

## 2017-07-26 DIAGNOSIS — E119 Type 2 diabetes mellitus without complications: Secondary | ICD-10-CM

## 2017-07-26 DIAGNOSIS — Z87891 Personal history of nicotine dependence: Secondary | ICD-10-CM | POA: Insufficient documentation

## 2017-07-26 DIAGNOSIS — R609 Edema, unspecified: Secondary | ICD-10-CM | POA: Diagnosis not present

## 2017-07-26 DIAGNOSIS — J9500 Unspecified tracheostomy complication: Secondary | ICD-10-CM

## 2017-07-26 DIAGNOSIS — C329 Malignant neoplasm of larynx, unspecified: Secondary | ICD-10-CM | POA: Diagnosis present

## 2017-07-26 DIAGNOSIS — I248 Other forms of acute ischemic heart disease: Secondary | ICD-10-CM | POA: Diagnosis present

## 2017-07-26 DIAGNOSIS — Z930 Tracheostomy status: Secondary | ICD-10-CM | POA: Diagnosis not present

## 2017-07-26 DIAGNOSIS — R748 Abnormal levels of other serum enzymes: Secondary | ICD-10-CM | POA: Diagnosis not present

## 2017-07-26 DIAGNOSIS — Z7951 Long term (current) use of inhaled steroids: Secondary | ICD-10-CM | POA: Diagnosis not present

## 2017-07-26 DIAGNOSIS — R0603 Acute respiratory distress: Secondary | ICD-10-CM | POA: Diagnosis not present

## 2017-07-26 DIAGNOSIS — I10 Essential (primary) hypertension: Secondary | ICD-10-CM | POA: Diagnosis not present

## 2017-07-26 DIAGNOSIS — Z85818 Personal history of malignant neoplasm of other sites of lip, oral cavity, and pharynx: Secondary | ICD-10-CM | POA: Insufficient documentation

## 2017-07-26 DIAGNOSIS — J9509 Other tracheostomy complication: Secondary | ICD-10-CM | POA: Insufficient documentation

## 2017-07-26 DIAGNOSIS — J439 Emphysema, unspecified: Secondary | ICD-10-CM | POA: Diagnosis not present

## 2017-07-26 DIAGNOSIS — E875 Hyperkalemia: Secondary | ICD-10-CM | POA: Diagnosis not present

## 2017-07-26 DIAGNOSIS — R0602 Shortness of breath: Secondary | ICD-10-CM | POA: Diagnosis not present

## 2017-07-26 DIAGNOSIS — Z923 Personal history of irradiation: Secondary | ICD-10-CM | POA: Diagnosis not present

## 2017-07-26 DIAGNOSIS — I5030 Unspecified diastolic (congestive) heart failure: Secondary | ICD-10-CM | POA: Diagnosis not present

## 2017-07-26 DIAGNOSIS — I160 Hypertensive urgency: Secondary | ICD-10-CM | POA: Diagnosis not present

## 2017-07-26 DIAGNOSIS — R0689 Other abnormalities of breathing: Secondary | ICD-10-CM | POA: Diagnosis not present

## 2017-07-26 LAB — I-STAT ARTERIAL BLOOD GAS, ED
ACID-BASE EXCESS: 2 mmol/L (ref 0.0–2.0)
BICARBONATE: 25 mmol/L (ref 20.0–28.0)
O2 SAT: 81 %
PCO2 ART: 35.7 mmHg (ref 32.0–48.0)
PH ART: 7.456 — AB (ref 7.350–7.450)
PO2 ART: 44 mmHg — AB (ref 83.0–108.0)
Patient temperature: 99.5
TCO2: 26 mmol/L (ref 22–32)

## 2017-07-26 LAB — BASIC METABOLIC PANEL
ANION GAP: 11 (ref 5–15)
BUN: 16 mg/dL (ref 6–20)
CHLORIDE: 93 mmol/L — AB (ref 101–111)
CO2: 25 mmol/L (ref 22–32)
Calcium: 9.3 mg/dL (ref 8.9–10.3)
Creatinine, Ser: 0.86 mg/dL (ref 0.61–1.24)
GFR calc Af Amer: >60 mL/min (ref >60)
Glucose, Bld: 175 mg/dL — ABNORMAL HIGH (ref 65–99)
POTASSIUM: 5.4 mmol/L — AB (ref 3.5–5.1)
Sodium: 129 mmol/L — ABNORMAL LOW (ref 135–145)

## 2017-07-26 LAB — CBC WITH DIFFERENTIAL/PLATELET
ABS IMMATURE GRANULOCYTES: 0 10*3/uL (ref 0.0–0.1)
BASOS PCT: 0 %
Basophils Absolute: 0 10*3/uL (ref 0.0–0.1)
Eosinophils Absolute: 0 10*3/uL (ref 0.0–0.7)
Eosinophils Relative: 0 %
HEMATOCRIT: 43.5 % (ref 39.0–52.0)
HEMOGLOBIN: 13.9 g/dL (ref 13.0–17.0)
IMMATURE GRANULOCYTES: 0 %
LYMPHS ABS: 0.8 10*3/uL (ref 0.7–4.0)
LYMPHS PCT: 10 %
MCH: 28.1 pg (ref 26.0–34.0)
MCHC: 32 g/dL (ref 30.0–36.0)
MCV: 88.1 fL (ref 78.0–100.0)
MONO ABS: 1.2 10*3/uL — AB (ref 0.1–1.0)
MONOS PCT: 16 %
NEUTROS PCT: 74 %
Neutro Abs: 5.8 10*3/uL (ref 1.7–7.7)
Platelets: 352 10*3/uL (ref 150–400)
RBC: 4.94 MIL/uL (ref 4.22–5.81)
RDW: 12.9 % (ref 11.5–15.5)
WBC: 7.9 10*3/uL (ref 4.0–10.5)

## 2017-07-26 LAB — SODIUM, URINE, RANDOM: Sodium, Ur: 73 mmol/L

## 2017-07-26 LAB — OSMOLALITY: Osmolality: 286 mOsm/kg (ref 275–295)

## 2017-07-26 LAB — TROPONIN I: Troponin I: 0.08 ng/mL (ref <0.03)

## 2017-07-26 LAB — OSMOLALITY, URINE: Osmolality, Ur: 458 mOsm/kg (ref 300–900)

## 2017-07-26 LAB — BRAIN NATRIURETIC PEPTIDE: B Natriuretic Peptide: 11.8 pg/mL (ref 0.0–100.0)

## 2017-07-26 MED ORDER — SODIUM CHLORIDE 0.9 % IV SOLN
1.0000 g | Freq: Once | INTRAVENOUS | Status: AC
  Administered 2017-07-26: 1 g via INTRAVENOUS
  Filled 2017-07-26: qty 10

## 2017-07-26 MED ORDER — SODIUM CHLORIDE 0.9 % IV SOLN: 1.0000 g | INTRAVENOUS | Status: DC

## 2017-07-26 MED ORDER — SODIUM CHLORIDE 0.9 % IV SOLN
500.0000 mg | Freq: Once | INTRAVENOUS | Status: AC
  Administered 2017-07-26: 500 mg via INTRAVENOUS
  Filled 2017-07-26: qty 500

## 2017-07-26 MED ORDER — AMLODIPINE BESYLATE 10 MG PO TABS
10.0000 mg | ORAL_TABLET | Freq: Every day | ORAL | Status: DC
  Administered 2017-07-27 – 2017-07-29 (×3): 10 mg
  Filled 2017-07-26 (×4): qty 1

## 2017-07-26 MED ORDER — SODIUM CHLORIDE 0.9 % IV SOLN
INTRAVENOUS | Status: DC
  Administered 2017-07-26: 20:00:00 via INTRAVENOUS

## 2017-07-26 MED ORDER — LORAZEPAM 2 MG/ML IJ SOLN
INTRAMUSCULAR | Status: AC
  Filled 2017-07-26: qty 1

## 2017-07-26 MED ORDER — LORAZEPAM 2 MG/ML IJ SOLN
0.5000 mg | Freq: Once | INTRAMUSCULAR | Status: AC
  Administered 2017-07-26: 0.5 mg via INTRAVENOUS

## 2017-07-26 MED ORDER — IPRATROPIUM-ALBUTEROL 0.5-2.5 (3) MG/3ML IN SOLN
3.0000 mL | RESPIRATORY_TRACT | Status: AC
  Administered 2017-07-26 – 2017-07-27 (×3): 3 mL via RESPIRATORY_TRACT
  Filled 2017-07-26: qty 3

## 2017-07-26 MED ORDER — FAMOTIDINE 20 MG PO TABS
20.0000 mg | ORAL_TABLET | Freq: Two times a day (BID) | ORAL | Status: DC
  Administered 2017-07-27 – 2017-07-29 (×6): 20 mg
  Filled 2017-07-26 (×6): qty 1

## 2017-07-26 MED ORDER — GUAIFENESIN 100 MG/5ML PO SOLN
200.0000 mg | ORAL | Status: DC | PRN
  Filled 2017-07-26: qty 10

## 2017-07-26 MED ORDER — IBUPROFEN 200 MG PO TABS: 400.0000 mg | ORAL_TABLET | Freq: Three times a day (TID) | ORAL | Status: DC | PRN

## 2017-07-26 MED ORDER — SODIUM CHLORIDE 0.9 % IV SOLN: 500.0000 mg | INTRAVENOUS | Status: DC

## 2017-07-26 MED ORDER — METHYLPREDNISOLONE SODIUM SUCC 125 MG IJ SOLR
60.0000 mg | Freq: Three times a day (TID) | INTRAMUSCULAR | Status: DC
  Administered 2017-07-27 – 2017-07-28 (×4): 60 mg via INTRAVENOUS
  Filled 2017-07-26 (×4): qty 2

## 2017-07-26 MED ORDER — TRIAMCINOLONE ACETONIDE 0.1 % EX CREA
1.0000 "application " | TOPICAL_CREAM | Freq: Two times a day (BID) | CUTANEOUS | Status: DC
  Administered 2017-07-28 – 2017-07-29 (×4): 1 via TOPICAL
  Filled 2017-07-26 (×2): qty 15

## 2017-07-26 MED ORDER — METHYLPREDNISOLONE SODIUM SUCC 125 MG IJ SOLR
125.0000 mg | Freq: Once | INTRAMUSCULAR | Status: AC
  Administered 2017-07-26: 125 mg via INTRAVENOUS
  Filled 2017-07-26: qty 2

## 2017-07-26 MED ORDER — NAPHAZOLINE-PHENIRAMINE 0.025-0.3 % OP SOLN: 2.0000 [drp] | Freq: Four times a day (QID) | OPHTHALMIC | Status: DC | PRN

## 2017-07-26 MED ORDER — ALBUTEROL (5 MG/ML) CONTINUOUS INHALATION SOLN: 10.0000 mg/h | INHALATION_SOLUTION | RESPIRATORY_TRACT | Status: DC

## 2017-07-26 MED ORDER — ENOXAPARIN SODIUM 40 MG/0.4ML ~~LOC~~ SOLN
40.0000 mg | SUBCUTANEOUS | Status: DC
  Administered 2017-07-27 – 2017-07-29 (×3): 40 mg via SUBCUTANEOUS
  Filled 2017-07-26 (×3): qty 0.4

## 2017-07-26 MED ORDER — OXYMETAZOLINE HCL 0.05 % NA SOLN: 2.0000 | Freq: Two times a day (BID) | NASAL | Status: DC | PRN

## 2017-07-26 MED ORDER — ALBUTEROL (5 MG/ML) CONTINUOUS INHALATION SOLN
INHALATION_SOLUTION | RESPIRATORY_TRACT | Status: AC
  Filled 2017-07-26: qty 20

## 2017-07-26 MED ORDER — SODIUM CHLORIDE 0.9 % IV SOLN
500.0000 mg | INTRAVENOUS | Status: DC
  Administered 2017-07-27: 500 mg via INTRAVENOUS
  Filled 2017-07-26: qty 500

## 2017-07-26 MED ORDER — NITROGLYCERIN IN D5W 200-5 MCG/ML-% IV SOLN
0.0000 ug/min | Freq: Once | INTRAVENOUS | Status: AC
  Administered 2017-07-26: 50 ug/min via INTRAVENOUS
  Filled 2017-07-26: qty 250

## 2017-07-26 MED ORDER — JEVITY 1.2 CAL PO LIQD
474.0000 mL | Freq: Four times a day (QID) | ORAL | Status: DC
  Administered 2017-07-27: 474 mL
  Filled 2017-07-26: qty 474

## 2017-07-26 MED ORDER — ALBUTEROL SULFATE (2.5 MG/3ML) 0.083% IN NEBU: 2.5000 mg | INHALATION_SOLUTION | RESPIRATORY_TRACT | Status: DC | PRN

## 2017-07-26 MED ORDER — IPRATROPIUM-ALBUTEROL 0.5-2.5 (3) MG/3ML IN SOLN
3.0000 mL | RESPIRATORY_TRACT | Status: AC
  Administered 2017-07-26 (×3): 3 mL via RESPIRATORY_TRACT
  Filled 2017-07-26: qty 3
  Filled 2017-07-26: qty 6
  Filled 2017-07-26: qty 3

## 2017-07-26 MED ORDER — SODIUM POLYSTYRENE SULFONATE 15 GM/60ML PO SUSP
15.0000 g | Freq: Once | ORAL | Status: AC
  Administered 2017-07-27: 15 g
  Filled 2017-07-26: qty 60

## 2017-07-26 MED ORDER — SODIUM CHLORIDE 0.9 % IV SOLN
1.0000 g | INTRAVENOUS | Status: DC
  Administered 2017-07-27: 1 g via INTRAVENOUS
  Filled 2017-07-26: qty 10

## 2017-07-26 NOTE — Progress Notes (Signed)
Pt with increased exp wheeze again, RT requesting 10mg  CAT. Okay per MD.

## 2017-07-26 NOTE — Progress Notes (Addendum)
RT started 10mg  CAT due to diffuse exp wheeze, diminished bbs. Order discontinued by admitting MD. New orders for Duo Q4, tx started by RT. No change noted post tx, pt still with diffuse exp wheeze . RT will continue to monitor.

## 2017-07-26 NOTE — ED Provider Notes (Addendum)
I saw and evaluated the patient, reviewed the resident's note and I agree with the findings and plan.  EKG: None 67 year old male presents here in respiratory distress.  Seen here today earlier and had a tracheostomy tube changed.  Came back tonight because of increasing shortness of breath.  History is limited due to his current state.  On exam he is tachycardic with some wheezing.  Does appear to have increased sections of his ostomy site.  Will suction in order chest x-ray and labs.  Pt given breathing treatments and improved. Chest xray without acute findings Troponin elevated, like;y demand ischemia ecg w/o evidence of acute infarction Will be admitted  CRITICAL CARE Performed by: Leota Jacobsen Total critical care time: 60 minutes Critical care time was exclusive of separately billable procedures and treating other patients. Critical care was necessary to treat or prevent imminent or life-threatening deterioration. Critical care was time spent personally by me on the following activities: development of treatment plan with patient and/or surrogate as well as nursing, discussions with consultants, evaluation of patient's response to treatment, examination of patient, obtaining history from patient or surrogate, ordering and performing treatments and interventions, ordering and review of laboratory studies, ordering and review of radiographic studies, pulse oximetry and re-evaluation of patient's condition.   Lacretia Leigh, MD 07/26/17 Pauline Aus    Lacretia Leigh, MD 08/08/17 [phone]

## 2017-07-26 NOTE — Progress Notes (Signed)
Sputum culture collected, sent to lab.  

## 2017-07-26 NOTE — Progress Notes (Signed)
ABG collected, sample was mixed venous. Results given to MD.

## 2017-07-26 NOTE — ED Triage Notes (Signed)
Patient here with trach collar coming loose and patient complains of feeling SOB. Alert and oriented, NAD-Trach in place

## 2017-07-26 NOTE — ED Provider Notes (Signed)
[facility] EMERGENCY DEPARTMENT Provider Note   CSN: [id number] Arrival date & time: 07/26/17  1146     History   Chief Complaint Chief Complaint  Patient presents with  . trach issue/SOB    HPI Nicholas Cobb is a 67 y.o. male.  HPI   67 year old male with history of neck cancer status post tracheostomy, chronic pain, cervical spondylosis, diabetes, dysphagia presenting for request to change his tracheostomy collar.  Patient communicates through writing on his notebook.  Patient expressed he would like to have his collar replaced only.  He denies having any other symptoms.  He denies having increased shortness of breath.  He mention having history of asthma but he has his medication at home.  Denies pain.  History is limited due to his inability to talk, but able to communicate through writing.  Patient expressed he does not want any other treatment aside from changing his collar.  Past Medical History:  Diagnosis Date  . Allergy   . Cancer ([place]) 1999   throat cancer  . Cervical spondylolysis 04/30/2011   severe  . Cervical spondylosis 03/25/2011  . Change in voice   . Chronic pain 03/25/2011  . Chronic sinusitis 04/27/2011  . Depression   . Difficulty urinating   . Emphysema 03/25/2011  . Essential hypertension, benign 12/03/2012  . GERD (gastroesophageal reflux disease) 03/25/2011  . Hearing loss   . Impaired glucose tolerance 04/27/2011  . 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 Rash   . Trouble swallowing   . Type II or unspecified type diabetes mellitus without mention of complication, uncontrolled 04/27/2011  . Ulcer   . UTI (lower urinary tract infection)   . Xerostomia 03/25/2011    Patient Active Problem List   Diagnosis Date Noted  . Hypoxia   . Tracheostomy malfunction ([place]) 03/01/2017  . Trachea displaced 12/29/2016  . Other tracheostomy complication ([place]) 47/42/5956  . Radionecrosis   . Tracheostomy care ([place])   . Hypoxemia   . Hemoptysis  01/26/2016  . Throat cancer ([place])   . Controlled diabetes mellitus type 2 with complications ([place])   . Acute on chronic respiratory failure with hypoxia ([place]) 06/14/2015  . Respiratory failure ([place]) 06/13/2015  . Tracheostomy dependence ([place])   . Cellulitis and abscess of head   . Cellulitis of face   . Tracheostomy status ([place])   . Acute sinus infection 10/29/2013  . Rash 10/29/2013  . Hyponatremia 12/03/2012  . Essential hypertension 12/03/2012  . Cervical spondylolysis 04/30/2011  . Chronic sinusitis 04/27/2011  . Diabetes 04/27/2011  . Erectile dysfunction 04/19/2011  . Preventative health care 03/25/2011  . Cervical spondylosis 03/25/2011  . Pulmonary emphysema ([place]) 03/25/2011  . GERD (gastroesophageal reflux disease) 03/25/2011  . Chronic pain 03/25/2011  . 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 Xerostomia 03/25/2011  . Tracheostomy in place [facility]) 02/04/2011  . Laryngeal cancer ([place]) 02/04/2011  . Encounter for PEG (percutaneous endoscopic gastrostomy) ([place]) 02/04/2011  . Attention to G-tube [facility]) 11/07/2010    Past Surgical History:  Procedure Laterality Date  . GASTROSTOMY TUBE PLACEMENT    . IR GENERIC HISTORICAL  04/03/2016   IR CM INJ ANY COLONIC TUBE W/FLUORO 04/03/2016 [name], [name]  . PEG TUBE PLACEMENT  11/1999  . TRACHEOSTOMY  11/1999  . TRACHEOSTOMY TUBE PLACEMENT  11/24/2016   Procedure: EMERGENT TRACHEOSTOMY CHANGE;  Surgeon: Jerrell Belfast, MD;  Location: [facility];  Service: ENT;;  . TRACHEOSTOMY TUBE PLACEMENT N/A 12/29/2016   Procedure: TRACHEOSTOMY;  Surgeon: Melissa Montane, MD;  Location: [facility];  Service: ENT;  Laterality: N/A;        Home Medications    Prior to Admission medications   Medication Sig Start Date End Date Taking? Authorizing Provider  albuterol (PROVENTIL HFA;VENTOLIN HFA) 108 (90 Base) MCG/ACT inhaler Inhale 2 puffs into the lungs every 4 (four) hours as needed for wheezing or shortness of breath. 03/09/16   [name], NP  AMBULATORY NON FORMULARY MEDICATION Trach(Shiley 6.0 XLT UP uncuffed), Trach Care Kit, Trach holders, Disposable inner cannulas 07/08/15   Gildardo Cranker, DO  betamethasone dipropionate 0.05 % lotion Apply topically 2 (two) times daily. To rash on leg Patient not taking: Reported on 03/05/2017 10/20/15   Gildardo Cranker, DO  IBU 400 MG tablet take 1 tablet by mouth three times a day if needed for pain Patient taking differently: Take 400 mg by mouth three times a day as needed for pain 01/17/17   [name], NP  ipratropium-albuterol (DUONEB) 0.5-2.5 (3) MG/3ML SOLN Take 3 mLs by nebulization every 6 (six) hours as needed. Patient taking differently: Take 3 mLs by nebulization every 6 (six) hours as needed (for wheezing or shortness of breath).  08/01/16   Robyn Haber, MD  losartan (COZAAR) 100 MG tablet Place 1 tablet (100 mg total) into feeding tube daily. 11/27/16   Cherene Altes, MD  naphazoline-pheniramine (NAPHCON-A) 0.025-0.3 % ophthalmic solution Place 2 drops into both eyes 4 (four) times daily as needed for eye irritation or allergies.     [provider]  Nutritional Supplements (FEEDING SUPPLEMENT, JEVITY 1.2 CAL,) LIQD Place 474 mLs into feeding tube every 6 (six) hours. 11/27/16   Cherene Altes, MD  oxymetazoline (12 HOUR NASAL SPRAY) 0.05 % nasal spray Place 2 sprays into the nose 2 (two) times daily as needed for congestion.     [provider]  ranitidine (ZANTAC) 150 MG tablet Take 150 mg per tube two times a day 11/27/16   Cherene Altes, MD  triamcinolone cream (KENALOG) 0.1 % Apply 1 application topically 2 (two) times daily. APPLY TO AFFECTED AREAS 11/27/16   Cherene Altes, MD    Family History Family History  Problem Relation Age of Onset  . Diabetes Mother   . Heart disease Father   . Stroke Other   . Diabetes Other   . Cancer Other        lung cancer    Social History Social History   Tobacco Use  .  Smoking status: Former Smoker    Packs/day: 1.00    Years: 23.00    Pack years: 23.00    Last attempt to quit: 11/06/1996    Years since quitting: 20.7  . Smokeless tobacco: Never Used  Substance Use Topics  . Alcohol use: No    Alcohol/week: 0.0 oz  . Drug use: No     Allergies   Oxybutynin chloride and Codeine   Review of Systems Review of Systems  Constitutional: Negative for fever.  Respiratory: Negative for shortness of breath.   Cardiovascular: Negative for chest pain.  All other systems reviewed and are negative.    Physical Exam Updated Vital Signs BP (!) 159/93 (BP Location: Left Arm)   Pulse 78   Temp 99.4 F (37.4 C) (Oral)   Resp 18   SpO2 99%   Physical Exam  Constitutional: No distress.  Chronically ill-appearing male laying in bed  HENT:  Significant facial edema that is chronic  in appearance, and involve cheeks, and lips and eyelids  Eyes: Conjunctivae are normal.  Neck: Neck supple.  Tracheostomy tube in place.  Breathing somewhat heavily.  Cardiovascular: Normal rate and regular rhythm.  Pulmonary/Chest:  Faint wheezes heard.  Abdominal: Soft.  Neurological: He is alert.  Skin: No rash noted.  Psychiatric: He has a normal mood and affect.  Nursing note and vitals reviewed.    ED Treatments / Results  Labs (all labs ordered are listed, but only abnormal results are displayed) Labs Reviewed - No data to display  EKG None  Radiology No results found.  Procedures Procedures (including critical care time)  Medications Ordered in ED Medications - No data to display   Initial Impression / Assessment and Plan / ED Course  I have reviewed the triage vital signs and the nursing notes.  Pertinent labs & imaging results that were available during my care of the patient were reviewed by me and considered in my medical decision making (see chart for details).     BP (!) 159/93 (BP Location: Left Arm)   Pulse 78   Temp 99.4 F (37.4  C) (Oral)   Resp 18   SpO2 99%    Final Clinical Impressions(s) / ED Diagnoses   Final diagnoses:  Tracheostomy complication, unspecified complication type [facility])    ED Discharge Orders    None     1:20 PM Patient with chronic tracheostomy tube in place.  His primary request is to have his collar replaced as it is soiled.  He does not request for any additional management and he made that clear.  He is at his baseline.  The collar have been replaced by out RT staff.  Care discussed with Dr. Johnney Killian.  Will discharge patient.   Domenic Moras, PA-C 07/26/17 [place], MD 07/27/17 [phone]

## 2017-07-26 NOTE — ED Provider Notes (Signed)
[facility] EMERGENCY DEPARTMENT Provider Note   CSN: [id number] Arrival date & time: 07/26/17  1857  History   Chief Complaint Chief Complaint  Patient presents with  . Respiratory Distress    HPI Nicholas Cobb is a 67 y.o. male.  The history is provided by the patient. The history is limited by the condition of the patient (only communicates via writting, respiratory distress).   67 yo M with PMHx throat CA s/p tracheostomy, chronic pain who presents for respiratory distress. Was seen in ED earlier today with request for trach collar change and discharged. Unclear sequence of events that led to EMS being called, but was noted to be in respiratory distress. Attempted albuterol treatment en route, but patient refused. satting in 35s. On arrival to ED, continued respiratory distress. Shakes his head no in response to if he is experiencing chest pain. HPI limited secondary to condition of patient.   Past Medical History:  Diagnosis Date  . Allergy   . Cancer ([place]) 1999   throat cancer  . Cervical spondylolysis 04/30/2011   severe  . Cervical spondylosis 03/25/2011  . Change in voice   . Chronic pain 03/25/2011  . Chronic sinusitis 04/27/2011  . Depression   . Difficulty urinating   . Emphysema 03/25/2011  . Essential hypertension, benign 12/03/2012  . GERD (gastroesophageal reflux disease) 03/25/2011  . Hearing loss   . Impaired glucose tolerance 04/27/2011  . 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 Rash   . Trouble swallowing   . Type II or unspecified type diabetes mellitus without mention of complication, uncontrolled 04/27/2011  . Ulcer   . UTI (lower urinary tract infection)   . Xerostomia 03/25/2011    Patient Active Problem List   Diagnosis Date Noted  . Hypertensive urgency 07/26/2017  . Hypoxia   . Tracheostomy malfunction ([place]) 03/01/2017  . Trachea displaced 12/29/2016  . Other tracheostomy complication ([place]) 09/32/6712  . Radionecrosis   .  Tracheostomy care ([place])   . Hypoxemia   . Hemoptysis 01/26/2016  . Throat cancer ([place])   . Controlled diabetes mellitus type 2 with complications ([place])   . Acute on chronic respiratory failure with hypoxia ([place]) 06/14/2015  . Respiratory failure ([place]) 06/13/2015  . Tracheostomy dependence ([place])   . Cellulitis and abscess of head   . Cellulitis of face   . Tracheostomy status ([place])   . Acute sinus infection 10/29/2013  . Rash 10/29/2013  . Hyponatremia 12/03/2012  . Essential hypertension 12/03/2012  . Cervical spondylolysis 04/30/2011  . Chronic sinusitis 04/27/2011  . Diabetes mellitus without complication ([place]) 45/80/9983  . Erectile dysfunction 04/19/2011  . Preventative health care 03/25/2011  . Cervical spondylosis 03/25/2011  . Pulmonary emphysema ([place]) 03/25/2011  . GERD (gastroesophageal reflux disease) 03/25/2011  . Chronic pain 03/25/2011  . 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 Xerostomia 03/25/2011  . Tracheostomy in place [facility]) 02/04/2011  . Laryngeal cancer ([place]) 02/04/2011  . Encounter for PEG (percutaneous endoscopic gastrostomy) ([place]) 02/04/2011  . Attention to G-tube [facility]) 11/07/2010    Past Surgical History:  Procedure Laterality Date  . GASTROSTOMY TUBE PLACEMENT    . IR GENERIC HISTORICAL  04/03/2016   IR CM INJ ANY COLONIC TUBE W/FLUORO 04/03/2016 [name], [name]  . PEG TUBE PLACEMENT  11/1999  . TRACHEOSTOMY  11/1999  . TRACHEOSTOMY TUBE PLACEMENT  11/24/2016   Procedure: EMERGENT TRACHEOSTOMY CHANGE;  Surgeon: Jerrell Belfast, MD;  Location: [facility];  Service: ENT;;  . Georgette Dover  TUBE PLACEMENT N/A 12/29/2016   Procedure: TRACHEOSTOMY;  Surgeon: Melissa Montane, MD;  Location: [facility];  Service: ENT;  Laterality: N/A;        Home Medications    Prior to Admission medications   Medication Sig Start Date End Date Taking? Authorizing Provider  albuterol (PROVENTIL HFA;VENTOLIN HFA) 108 (90 Base) MCG/ACT inhaler Inhale 2 puffs into the  lungs every 4 (four) hours as needed for wheezing or shortness of breath. 03/09/16   [name], NP  AMBULATORY NON FORMULARY MEDICATION Trach(Shiley 6.0 XLT UP uncuffed), Trach Care Kit, Trach holders, Disposable inner cannulas 07/08/15   Gildardo Cranker, DO  betamethasone dipropionate 0.05 % lotion Apply topically 2 (two) times daily. To rash on leg Patient not taking: Reported on 03/05/2017 10/20/15   Gildardo Cranker, DO  IBU 400 MG tablet take 1 tablet by mouth three times a day if needed for pain Patient taking differently: Take 400 mg by mouth three times a day as needed for pain 01/17/17   [name], NP  ipratropium-albuterol (DUONEB) 0.5-2.5 (3) MG/3ML SOLN Take 3 mLs by nebulization every 6 (six) hours as needed. Patient taking differently: Take 3 mLs by nebulization every 6 (six) hours as needed (for wheezing or shortness of breath).  08/01/16   Robyn Haber, MD  losartan (COZAAR) 100 MG tablet Place 1 tablet (100 mg total) into feeding tube daily. 11/27/16   Cherene Altes, MD  naphazoline-pheniramine (NAPHCON-A) 0.025-0.3 % ophthalmic solution Place 2 drops into both eyes 4 (four) times daily as needed for eye irritation or allergies.     [provider]  Nutritional Supplements (FEEDING SUPPLEMENT, JEVITY 1.2 CAL,) LIQD Place 474 mLs into feeding tube every 6 (six) hours. 11/27/16   Cherene Altes, MD  oxymetazoline (12 HOUR NASAL SPRAY) 0.05 % nasal spray Place 2 sprays into the nose 2 (two) times daily as needed for congestion.     [provider]  ranitidine (ZANTAC) 150 MG tablet Take 150 mg per tube two times a day 11/27/16   Cherene Altes, MD  triamcinolone cream (KENALOG) 0.1 % Apply 1 application topically 2 (two) times daily. APPLY TO AFFECTED AREAS 11/27/16   Cherene Altes, MD    Family History Family History  Problem Relation Age of Onset  . Diabetes Mother   . Heart disease Father   . Stroke Other   . Diabetes Other     . Cancer Other        lung cancer    Social History Social History   Tobacco Use  . Smoking status: Former Smoker    Packs/day: 1.00    Years: 23.00    Pack years: 23.00    Last attempt to quit: 11/06/1996    Years since quitting: 20.7  . Smokeless tobacco: Never Used  Substance Use Topics  . Alcohol use: No    Alcohol/week: 0.0 oz  . Drug use: No     Allergies   Oxybutynin chloride and Codeine   Review of Systems Review of Systems  Unable to perform ROS: Severe respiratory distress     Physical Exam Updated Vital Signs BP 110/71   Pulse 79   Resp 14   Ht _0  (1.778 m)   Wt 72.1 kg (159 lb)   SpO2 100%   BMI 22.81 kg/m   Physical Exam  Constitutional: He appears well-developed and well-nourished. He appears distressed.  HENT:  Edema to face, periorbital, lips that is reported to  be chronic  Eyes: Conjunctivae are normal.  Neck: Neck supple.  Trach collar in place  Cardiovascular: Regular rhythm, normal heart sounds and intact distal pulses. Tachycardia present.  No murmur heard. Pulmonary/Chest: Accessory muscle usage present. Tachypnea noted. No respiratory distress. He has wheezes. He has rhonchi (throughout).  Abdominal: Soft. He exhibits no distension. There is no tenderness. There is no guarding.  Musculoskeletal: He exhibits no edema.  Neurological: He is alert.  Skin: Skin is warm and dry.  Nursing note and vitals reviewed.    ED Treatments / Results  Labs (all labs ordered are listed, but only abnormal results are displayed) Labs Reviewed  CBC WITH DIFFERENTIAL/PLATELET - Abnormal; Notable for the following components:      Result Value   Monocytes Absolute 1.2 (*)    All other components within normal limits  BASIC METABOLIC PANEL - Abnormal; Notable for the following components:   Sodium 129 (*)    Potassium 5.4 (*)    Chloride 93 (*)    Glucose, Bld 175 (*)    All other components within normal limits  TROPONIN I - Abnormal;  Notable for the following components:   Troponin I 0.08 (*)    All other components within normal limits  I-STAT ARTERIAL BLOOD GAS, ED - Abnormal; Notable for the following components:   pH, Arterial 7.456 (*)    pO2, Arterial 44.0 (*)    All other components within normal limits  CULTURE, BLOOD (ROUTINE X 2)  CULTURE, BLOOD (ROUTINE X 2)  RESPIRATORY PANEL BY PCR  GRAM STAIN  CULTURE, RESPIRATORY (NON-EXPECTORATED)  OSMOLALITY, URINE  SODIUM, URINE, RANDOM  BRAIN NATRIURETIC PEPTIDE  STREP PNEUMONIAE URINARY ANTIGEN  OSMOLALITY  TSH  HIV ANTIBODY (ROUTINE TESTING)  TROPONIN I  TROPONIN I  LEGIONELLA PNEUMOPHILA SEROGP 1 UR AG  BASIC METABOLIC PANEL    EKG EKG Interpretation  Date/Time:  Thursday July 26 2017 19:07:04 EDT Ventricular Rate:  124 PR Interval:    QRS Duration: 120 QT Interval:  301 QTC Calculation: 433 R Axis:   98 Text Interpretation:  Sinus tachycardia Consider right atrial enlargement Nonspecific intraventricular conduction delay Borderline ST elevation, anterior leads Artifact in lead(s) I II III aVR aVL aVF V1 V3 V4 V5 V6 When compared with ECG of 03/01/2017, Premature atrial complexes are now present Confirmed by Delora Fuel ([id number]) on 07/26/2017 11:03:36 PM   Radiology Dg Chest Port 1 View  Result Date: 07/26/2017 CLINICAL DATA:  Shortness of breath with wheezing. EXAM: PORTABLE CHEST 1 VIEW COMPARISON:  06/30/2017 and 03/04/2017 FINDINGS: Lungs are adequately inflated demonstrate bibasilar mixed interstitial airspace density which may be due to early infection versus atelectasis. No effusion. Cardiomediastinal silhouette and remainder of the exam is unchanged. IMPRESSION: Mild bibasilar opacification which may be due to infection versus atelectasis. Recommend follow-up to resolution. Electronically Signed   By: Marin Olp M.D.   On: 07/26/2017 19:31    Procedures Procedures (including critical care time)  Medications Ordered in ED Medications    0.9 %  sodium chloride infusion ( Intravenous New Bag/Given 07/26/17 1948)  ipratropium-albuterol (DUONEB) 0.5-2.5 (3) MG/3ML nebulizer solution 3 mL (3 mLs Nebulization Given 07/26/17 2058)  methylPREDNISolone sodium succinate (SOLU-MEDROL) 125 mg/2 mL injection 60 mg (has no administration in time range)  ibuprofen (ADVIL,MOTRIN) tablet 400 mg (has no administration in time range)  naphazoline-pheniramine (NAPHCON-A) 0.025-0.3 % ophthalmic solution 2 drop (has no administration in time range)  feeding supplement (JEVITY 1.2 CAL) liquid 474 mL (has no administration  in time range)  oxymetazoline (AFRIN) 0.05 % nasal spray 2 spray (has no administration in time range)  famotidine (PEPCID) tablet 20 mg (has no administration in time range)  triamcinolone cream (KENALOG) 0.1 % 1 application (has no administration in time range)  albuterol (PROVENTIL) (2.5 MG/3ML) 0.083% nebulizer solution 2.5 mg (has no administration in time range)  guaiFENesin (ROBITUSSIN) 100 MG/5ML solution 200 mg (has no administration in time range)  sodium polystyrene (KAYEXALATE) 15 GM/60ML suspension 15 g (has no administration in time range)  amLODipine (NORVASC) tablet 10 mg (10 mg Per Tube Not Given 07/26/17 2102)  enoxaparin (LOVENOX) injection 40 mg (has no administration in time range)  cefTRIAXone (ROCEPHIN) 1 g in sodium chloride 0.9 % 100 mL IVPB (has no administration in time range)  azithromycin (ZITHROMAX) 500 mg in sodium chloride 0.9 % 250 mL IVPB (has no administration in time range)  ipratropium-albuterol (DUONEB) 0.5-2.5 (3) MG/3ML nebulizer solution 3 mL (3 mLs Nebulization Given 07/26/17 2003)  methylPREDNISolone sodium succinate (SOLU-MEDROL) 125 mg/2 mL injection 125 mg (125 mg Intravenous Given 07/26/17 1946)  nitroGLYCERIN 50 mg in dextrose 5 % 250 mL (0.2 mg/mL) infusion (0 mcg/min Intravenous Stopped 07/26/17 2001)  LORazepam (ATIVAN) injection 0.5 mg (0.5 mg Intravenous Given 07/26/17 1948)   cefTRIAXone (ROCEPHIN) 1 g in sodium chloride 0.9 % 100 mL IVPB (0 g Intravenous Stopped 07/26/17 2056)  azithromycin (ZITHROMAX) 500 mg in sodium chloride 0.9 % 250 mL IVPB (0 mg Intravenous Stopped 07/26/17 2126)     Initial Impression / Assessment and Plan / ED Course  I have reviewed the triage vital signs and the nursing notes.  Pertinent labs & imaging results that were available during my care of the patient were reviewed by me and considered in my medical decision making (see chart for details).     Nicholas Cobb is a 67 y.o. male with PMHx of throat CA s/p tracheostomy who p/w respiratory distress. Reviewed and confirmed nursing documentation for past medical history, family history, social history. VS RR 20s, BP 212/144. Exam remarkable for tachypnea, accessory muscule use, wheezes and rhonchi bilaterally. Concern for COPD exacerbation, PNA, increased secretions in tube, possible pulmonary edema given HTN.   Attempted NT suctioning with only transient improvement in sats. Placed on vent with pressure support with improvement in work of breathing. Nitroglycerin infusion initiated, though BP quickly improved with minimal positive pressure, and this was discontinued. Given IV solumedtrol, duonebs.   On repeat exam, pt resting comfortably, not tachypneic, satting 100% on RA. CXR with mild bibasilar opacifications that could be secondary to infection. IV ceftraixone, azithromycin given. Blood cultures pending. Labs with chronic hyponatremia 129, mildly increased K at 5.4, otherwise unremarkable. CBC wnl. Trop mildly elevated at 0.08, though c/w prior. EKG with no acute findings.   Old records reviewed. Labs reviewed by me and used in the medical decision making.  Imaging viewed and interpreted by me and used in the medical decision making (formal interpretation from radiologist). EKG reviewed by me and used in the medical decision making. Admitted to hospitalist.    Final Clinical  Impressions(s) / ED Diagnoses   Final diagnoses:  Acute respiratory failure with hypoxia [facility])  Respiratory distress  Community acquired pneumonia, unspecified laterality    ED Discharge Orders    None       Norm Salt, MD 07/27/17 [place]    Lacretia Leigh, MD 07/27/17 [phone]

## 2017-07-26 NOTE — ED Notes (Addendum)
Per pt request, called pts brother Seaton Hofmann [phone]) who is in [place]. Brother aware of pt location here in the ED. Other phone numbers for sisters called, but no answer. Marcello Moores states that he will call his sisters and let them know his location.  Truett Mainland [phone] [place] [phone]

## 2017-07-26 NOTE — H&P (Signed)
History and Physical    Nicholas Cobb TWS:[id number] DOB: 13-Jun-1950 DOA: 07/26/2017  Referring MD/NP/PA:   PCP: Rogers Blocker, MD   Patient coming from:  The patient is coming from home.  At baseline, pt is dependent for most of ADL.      Chief Complaint: SOB  HPI: Nicholas Cobb is a 67 y.o. male with medical history significant of laryngeal cancer status post radiation, now dependent on trach and PEG, hypertension, diet-controlled diabetes, GERD, pulmonary emphysema, former smoker, who presents with shortness of breath.  Pt is non-verbal, but can communicate with writing. History is limited secondary to condition of patient. Per report, pt was seen in ED earlier today with request for trach collar change and discharged. Pt developedacute respiratory distress and SOB. He was found to have oxygen desaturation to 75% and tachycardia with heart rate up to 120.  She was found to have elevated blood pressure 238/145, which improved quickly with nitroglycerin drip in ED. He is off nitro gtt now, bp is 119/89. He shakes his head no in response to if he is experiencing chest pain.  No active nausea, vomiting or diarrhea noted.  She moves all extremities.  ED Course: pt was found to have troponin 0 0.08, WBC 7.9, potassium 5.4, sodium 129, creatinine normal, temperature 99.4, tachycardia, tachypnea, chest x-ray showed bibasilar opacities.  Admitted to stepdown as inpatient.Pt is   Review of Systems: Could not be reviewed accurately due to nonverbal condition.  Allergy:  Allergies  Allergen Reactions  . Oxybutynin Chloride Nausea And Vomiting  . Codeine Nausea And Vomiting and Rash    Past Medical History:  Diagnosis Date  . Allergy   . Cancer ([place]) 1999   throat cancer  . Cervical spondylolysis 04/30/2011   severe  . Cervical spondylosis 03/25/2011  . Change in voice   . Chronic pain 03/25/2011  . Chronic sinusitis 04/27/2011  . Depression   . Difficulty urinating   . Emphysema  03/25/2011  . Essential hypertension, benign 12/03/2012  . GERD (gastroesophageal reflux disease) 03/25/2011  . Hearing loss   . Impaired glucose tolerance 04/27/2011  . 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 Rash   . Trouble swallowing   . Type II or unspecified type diabetes mellitus without mention of complication, uncontrolled 04/27/2011  . Ulcer   . UTI (lower urinary tract infection)   . Xerostomia 03/25/2011    Past Surgical History:  Procedure Laterality Date  . GASTROSTOMY TUBE PLACEMENT    . IR GENERIC HISTORICAL  04/03/2016   IR CM INJ ANY COLONIC TUBE W/FLUORO 04/03/2016 [name], [name]  . PEG TUBE PLACEMENT  11/1999  . TRACHEOSTOMY  11/1999  . TRACHEOSTOMY TUBE PLACEMENT  11/24/2016   Procedure: EMERGENT TRACHEOSTOMY CHANGE;  Surgeon: Jerrell Belfast, MD;  Location: [facility];  Service: ENT;;  . TRACHEOSTOMY TUBE PLACEMENT N/A 12/29/2016   Procedure: TRACHEOSTOMY;  Surgeon: Melissa Montane, MD;  Location: [facility];  Service: ENT;  Laterality: N/A;    Social History:  reports that he quit smoking about 20 years ago. He has a 23.00 pack-year smoking history. He has never used smokeless tobacco. He reports that he does not drink alcohol or use drugs.  Family History:  Family History  Problem Relation Age of Onset  . Diabetes Mother   . Heart disease Father   . Stroke Other   . Diabetes Other   . Cancer Other        lung cancer  Prior to Admission medications   Medication Sig Start Date End Date Taking? Authorizing Provider  albuterol (PROVENTIL HFA;VENTOLIN HFA) 108 (90 Base) MCG/ACT inhaler Inhale 2 puffs into the lungs every 4 (four) hours as needed for wheezing or shortness of breath. 03/09/16   [name], NP  AMBULATORY NON FORMULARY MEDICATION Trach(Shiley 6.0 XLT UP uncuffed), Trach Care Kit, Trach holders, Disposable inner cannulas 07/08/15   Gildardo Cranker, DO  betamethasone dipropionate 0.05 % lotion Apply topically 2 (two) times daily. To rash on  leg Patient not taking: Reported on 03/05/2017 10/20/15   Gildardo Cranker, DO  IBU 400 MG tablet take 1 tablet by mouth three times a day if needed for pain Patient taking differently: Take 400 mg by mouth three times a day as needed for pain 01/17/17   [name], NP  ipratropium-albuterol (DUONEB) 0.5-2.5 (3) MG/3ML SOLN Take 3 mLs by nebulization every 6 (six) hours as needed. Patient taking differently: Take 3 mLs by nebulization every 6 (six) hours as needed (for wheezing or shortness of breath).  08/01/16   Robyn Haber, MD  losartan (COZAAR) 100 MG tablet Place 1 tablet (100 mg total) into feeding tube daily. 11/27/16   Cherene Altes, MD  naphazoline-pheniramine (NAPHCON-A) 0.025-0.3 % ophthalmic solution Place 2 drops into both eyes 4 (four) times daily as needed for eye irritation or allergies.     [provider]  Nutritional Supplements (FEEDING SUPPLEMENT, JEVITY 1.2 CAL,) LIQD Place 474 mLs into feeding tube every 6 (six) hours. 11/27/16   Cherene Altes, MD  oxymetazoline (12 HOUR NASAL SPRAY) 0.05 % nasal spray Place 2 sprays into the nose 2 (two) times daily as needed for congestion.     [provider]  ranitidine (ZANTAC) 150 MG tablet Take 150 mg per tube two times a day 11/27/16   Cherene Altes, MD  triamcinolone cream (KENALOG) 0.1 % Apply 1 application topically 2 (two) times daily. APPLY TO AFFECTED AREAS 11/27/16   Cherene Altes, MD    Physical Exam: Vitals:   07/27/17 0300 07/27/17 0333 07/27/17 0345 07/27/17 0415  BP: 116/70  112/80 138/84  Pulse: 87  85 87  Resp: (!) 21  (!) 24 18  SpO2: 98% 94% 100% 100%  Weight:      Height:       General: Not in acute distress HEENT: pt has puffy face and swelling lips and eyelids       Eyes: no scleral icterus.       ENT: has trach in place.       Neck: No JVD, no bruit, no mass felt. Heme: No neck lymph node enlargement. Cardiac: S1/S2, RRR, No murmurs, No gallops or  rubs. Respiratory: Has diffuse rhonchi and wheezing bilaterally. GI: Soft, nondistended, nontender, no rebound pain, no organomegaly, BS present. Has PEG tube with clean surroundings. GU: No hematuria Ext: No pitting leg edema bilaterally. 2+DP/PT pulse bilaterally. Musculoskeletal: No joint deformities, No joint redness or warmth, no limitation of ROM in spin. Skin: No rashes.  Neuro: nonverbal, communicated with writing. Moves all extremities. Psych: Patient is not psychotic,.  Labs on Admission: I have personally reviewed following labs and imaging studies  CBC: Recent Labs  Lab 07/26/17 1915  WBC 7.9  NEUTROABS 5.8  HGB 13.9  HCT 43.5  MCV 88.1  PLT 893   Basic Metabolic Panel: Recent Labs  Lab 07/26/17 1915 07/26/17 2335  NA 129* 132*  K 5.4* 5.3*  CL 93*  97*  CO2 25 28  GLUCOSE 175* 120*  BUN 16 15  CREATININE 0.86 0.93  CALCIUM 9.3 8.6*   GFR: Estimated Creatinine Clearance: 76.5 mL/min (by C-G formula based on SCr of 0.93 mg/dL). Liver Function Tests: No results for input(s): AST, ALT, ALKPHOS, BILITOT, PROT, ALBUMIN in the last 168 hours. No results for input(s): LIPASE, AMYLASE in the last 168 hours. No results for input(s): AMMONIA in the last 168 hours. Coagulation Profile: No results for input(s): INR, PROTIME in the last 168 hours. Cardiac Enzymes: Recent Labs  Lab 07/26/17 2121 07/27/17 0336  TROPONINI 0.08* 0.07*   BNP (last 3 results) No results for input(s): PROBNP in the last 8760 hours. HbA1C: Recent Labs    07/27/17 0336  HGBA1C 6.7*   CBG: No results for input(s): GLUCAP in the last 168 hours. Lipid Profile: Recent Labs    07/27/17 0336  CHOL 125  HDL 52  LDLCALC 68  TRIG 25  CHOLHDL 2.4   Thyroid Function Tests: Recent Labs    07/27/17 0336  TSH 1.188   Anemia Panel: No results for input(s): VITAMINB12, FOLATE, FERRITIN, TIBC, IRON, RETICCTPCT in the last 72 hours. Urine analysis:    Component Value Date/Time    COLORURINE YELLOW 09/14/2015 0910   APPEARANCEUR CLEAR 09/14/2015 0910   LABSPEC 1.013 09/14/2015 0910   PHURINE 7.5 09/14/2015 0910   GLUCOSEU NEGATIVE 09/14/2015 0910   GLUCOSEU NEGATIVE 10/29/2013 1118   HGBUR NEGATIVE 09/14/2015 0910   BILIRUBINUR NEGATIVE 09/14/2015 0910   BILIRUBINUR Negative 10/08/2014 0930   KETONESUR NEGATIVE 09/14/2015 0910   PROTEINUR NEGATIVE 09/14/2015 0910   UROBILINOGEN 0.2 10/08/2014 0930   UROBILINOGEN 0.2 10/29/2013 1118   NITRITE NEGATIVE 09/14/2015 0910   LEUKOCYTESUR NEGATIVE 09/14/2015 0910   Sepsis Labs: '@LABRCNTIP'$ (procalcitonin:4,lacticidven:4) ) Recent Results (from the past 240 hour(s))  Respiratory Panel by PCR     Status: None   Collection Time: 07/26/17  8:55 PM  Result Value Ref Range Status   Adenovirus NOT DETECTED NOT DETECTED Final   Coronavirus 229E NOT DETECTED NOT DETECTED Final   Coronavirus HKU1 NOT DETECTED NOT DETECTED Final   Coronavirus NL63 NOT DETECTED NOT DETECTED Final   Coronavirus OC43 NOT DETECTED NOT DETECTED Final   Metapneumovirus NOT DETECTED NOT DETECTED Final   Rhinovirus / Enterovirus NOT DETECTED NOT DETECTED Final   Influenza A NOT DETECTED NOT DETECTED Final   Influenza B NOT DETECTED NOT DETECTED Final   Parainfluenza Virus 1 NOT DETECTED NOT DETECTED Final   Parainfluenza Virus 2 NOT DETECTED NOT DETECTED Final   Parainfluenza Virus 3 NOT DETECTED NOT DETECTED Final   Parainfluenza Virus 4 NOT DETECTED NOT DETECTED Final   Respiratory Syncytial Virus NOT DETECTED NOT DETECTED Final   Bordetella pertussis NOT DETECTED NOT DETECTED Final   Chlamydophila pneumoniae NOT DETECTED NOT DETECTED Final   Mycoplasma pneumoniae NOT DETECTED NOT DETECTED Final    Comment: Performed at [facility], [place]. [address], [place], [place] [address]  Culture, respiratory (NON-Expectorated)     Status: None (Preliminary result)   Collection Time: 07/26/17 10:39 PM  Result Value Ref Range Status   Specimen  Description TRACHEAL ASPIRATE  Final   Special Requests NONE  Final   Gram Stain   Final    FEW WBC PRESENT, PREDOMINANTLY PMN RARE SQUAMOUS EPITHELIAL CELLS PRESENT MODERATE GRAM POSITIVE COCCI MODERATE GRAM POSITIVE RODS RARE GRAM NEGATIVE RODS Performed at [facility], [place] [address], [place], [place] [address]  Culture PENDING  Incomplete   Report Status PENDING  Incomplete     Radiological Exams on Admission: Dg Chest Port 1 View  Result Date: 07/26/2017 CLINICAL DATA:  Shortness of breath with wheezing. EXAM: PORTABLE CHEST 1 VIEW COMPARISON:  06/30/2017 and 03/04/2017 FINDINGS: Lungs are adequately inflated demonstrate bibasilar mixed interstitial airspace density which may be due to early infection versus atelectasis. No effusion. Cardiomediastinal silhouette and remainder of the exam is unchanged. IMPRESSION: Mild bibasilar opacification which may be due to infection versus atelectasis. Recommend follow-up to resolution. Electronically Signed   By: Marin Olp M.D.   On: 07/26/2017 19:31     EKG: Independently reviewed.  Sinus rhythm, tachycardia, nonspecific T wave change.  Assessment/Plan Principal Problem:   Acute on chronic respiratory failure with hypoxia (HCC) Active Problems:   Tracheostomy in place [facility])   Laryngeal cancer ([place])   Encounter for PEG (percutaneous endoscopic gastrostomy) ([place])   Pulmonary emphysema (HCC)   GERD (gastroesophageal reflux disease)   Diabetes mellitus without complication (HCC)   Hyponatremia   Essential hypertension   Hypertensive urgency   Elevated troponin   Hyperkalemia   Acute on chronic respiratory failure with hypoxia: Likely due to COPD exacerbation.  Patient carries diagnosis of pulmonary emphysema.  She is a former smoker, indicating possible COPD.  Chest x-ray showed bilateral basilar opacity, cannot completely rule out lobar pneumonia.  wiill start antibiotics to cover both.  -will admit patient to SDU bed    -Nebulizers: scheduled Duoneb and prn albuterol -Solu-Medrol 60 mg IV tid -Rocephin plus azithromycin -Mucinex for cough  -Urine S. pneumococcal antigen -Follow up blood culture x2, sputum culture, respiratory virus panel. -Nasal cannula oxygen as needed to maintain O2 saturation 92% or greater  Tracheostomy in place [facility]): -RT consult -deep suction  Laryngeal cancer [facility]): He is status-post remote treatment with radiation. Follows with ENT at [facility]. s/p of PEG (percutaneous endoscopic gastrostomy) -Nutrition JEVITY per tube  Hyponatremia : Na 129-->132. Lost likely poor intake and dehydration - Will check urine sodium, urine osmolality, serum osmolality. - check TSH - IVF:  normal saline at 125 mL/h - hold cozar   Hypertension and hypertensive urgency: Responded to IV nitroglycerin drip, quickly decreased to 81/58, and then 119/89. This is too fast correction -hold Cozaar due to hyponatremia. -start amlodipine -IV hydralazine.tart   GERD: -Pepcid   Diet controlled diabetes mellitus without complication ([place]): Last A1c 6.8 on 11/30/16, well controled. Patient is not taking meds at home. Blood sugar is 175.  -check CBG qAM  Elevated troponin: troponin 0.08, trending down to 0.07. No chest pain, most likely due to demand ischemia secondary to hypertensiv urgency.   - cycle CE q6 x3 and repeat EKG in the am  - prn aspirin - Risk factor stratification: will check FLP and A1C  - 2d echo  Hyperkalemia: Potassium 4.4 -Mild Kayexalate 15 g    DVT ppx: SQ Lovenox Code Status: Full code Family Communication: None at bed side.   Disposition Plan:  Anticipate discharge back to previous home environment Consults called:  none Admission status:   SDU/inpation       Date of Service 07/27/2017    Ivor Costa [facility] Pager [phone]  If 7PM-7AM, please contact night-coverage [email or web address] Password [facility] 07/27/2017, 5:12 AM

## 2017-07-26 NOTE — ED Notes (Signed)
Nurse collected labs. 

## 2017-07-26 NOTE — ED Triage Notes (Signed)
Per [facility] patient coming from home complaining of SOB. Patient initial oxygen sat 75%. Albuterol nebulizer given in route. Sats up to 85%. Patient not cooperative with ems. Hr 120s.. Audible wheezing on arrival via tracheostomy.

## 2017-07-27 ENCOUNTER — Inpatient Hospital Stay (HOSPITAL_COMMUNITY): Payer: Medicare Other

## 2017-07-27 DIAGNOSIS — R778 Other specified abnormalities of plasma proteins: Secondary | ICD-10-CM | POA: Diagnosis present

## 2017-07-27 DIAGNOSIS — R748 Abnormal levels of other serum enzymes: Secondary | ICD-10-CM

## 2017-07-27 DIAGNOSIS — E875 Hyperkalemia: Secondary | ICD-10-CM

## 2017-07-27 DIAGNOSIS — I5030 Unspecified diastolic (congestive) heart failure: Secondary | ICD-10-CM

## 2017-07-27 DIAGNOSIS — R7989 Other specified abnormal findings of blood chemistry: Secondary | ICD-10-CM

## 2017-07-27 LAB — MRSA PCR SCREENING: MRSA by PCR: NEGATIVE

## 2017-07-27 LAB — RESPIRATORY PANEL BY PCR
ADENOVIRUS-RVPPCR: NOT DETECTED
Bordetella pertussis: NOT DETECTED
CORONAVIRUS 229E-RVPPCR: NOT DETECTED
CORONAVIRUS HKU1-RVPPCR: NOT DETECTED
CORONAVIRUS NL63-RVPPCR: NOT DETECTED
CORONAVIRUS OC43-RVPPCR: NOT DETECTED
Chlamydophila pneumoniae: NOT DETECTED
Influenza A: NOT DETECTED
Influenza B: NOT DETECTED
Metapneumovirus: NOT DETECTED
Mycoplasma pneumoniae: NOT DETECTED
PARAINFLUENZA VIRUS 1-RVPPCR: NOT DETECTED
Parainfluenza Virus 2: NOT DETECTED
Parainfluenza Virus 3: NOT DETECTED
Parainfluenza Virus 4: NOT DETECTED
Respiratory Syncytial Virus: NOT DETECTED
Rhinovirus / Enterovirus: NOT DETECTED

## 2017-07-27 LAB — ECHOCARDIOGRAM COMPLETE
HEIGHTINCHES: 70 in
Weight: 2544 oz

## 2017-07-27 LAB — BASIC METABOLIC PANEL
Anion gap: 7 (ref 5–15)
Anion gap: 8 (ref 5–15)
BUN: 15 mg/dL (ref 6–20)
BUN: 15 mg/dL (ref 6–20)
CALCIUM: 8.6 mg/dL — AB (ref 8.9–10.3)
CHLORIDE: 96 mmol/L — AB (ref 101–111)
CO2: 28 mmol/L (ref 22–32)
CO2: 29 mmol/L (ref 22–32)
CREATININE: 0.81 mg/dL (ref 0.61–1.24)
CREATININE: 0.93 mg/dL (ref 0.61–1.24)
Calcium: 9.1 mg/dL (ref 8.9–10.3)
Chloride: 97 mmol/L — ABNORMAL LOW (ref 101–111)
GFR calc Af Amer: >60 mL/min (ref >60)
GFR calc Af Amer: >60 mL/min (ref >60)
GFR calc non Af Amer: >60 mL/min (ref >60)
GLUCOSE: 120 mg/dL — AB (ref 65–99)
GLUCOSE: 138 mg/dL — AB (ref 65–99)
Potassium: 5.3 mmol/L — ABNORMAL HIGH (ref 3.5–5.1)
Potassium: 4.2 mmol/L (ref 3.5–5.1)
Sodium: 132 mmol/L — ABNORMAL LOW (ref 135–145)
Sodium: 133 mmol/L — ABNORMAL LOW (ref 135–145)

## 2017-07-27 LAB — HEMOGLOBIN A1C
HEMOGLOBIN A1C: 6.7 % — AB (ref 4.8–5.6)
Mean Plasma Glucose: 145.59 mg/dL

## 2017-07-27 LAB — TSH: TSH: 1.188 u[IU]/mL (ref 0.350–4.500)

## 2017-07-27 LAB — CBG MONITORING, ED: GLUCOSE-CAPILLARY: 167 mg/dL — AB (ref 65–99)

## 2017-07-27 LAB — LIPID PANEL
CHOLESTEROL: 125 mg/dL (ref 0–200)
HDL: 52 mg/dL (ref >40)
LDL Cholesterol: 68 mg/dL (ref 0–99)
TRIGLYCERIDES: 25 mg/dL (ref <150)
Total CHOL/HDL Ratio: 2.4 RATIO
VLDL: 5 mg/dL (ref 0–40)

## 2017-07-27 LAB — TROPONIN I
Troponin I: 0.07 ng/mL (ref <0.03)
Troponin I: 0.05 ng/mL (ref <0.03)

## 2017-07-27 LAB — STREP PNEUMONIAE URINARY ANTIGEN: STREP PNEUMO URINARY ANTIGEN: NEGATIVE

## 2017-07-27 LAB — HIV ANTIBODY (ROUTINE TESTING W REFLEX): HIV Screen 4th Generation wRfx: NONREACTIVE

## 2017-07-27 LAB — GLUCOSE, CAPILLARY: Glucose-Capillary: 147 mg/dL — ABNORMAL HIGH (ref 65–99)

## 2017-07-27 MED ORDER — JEVITY 1.2 CAL PO LIQD
474.0000 mL | Freq: Four times a day (QID) | ORAL | Status: DC
  Administered 2017-07-27 – 2017-07-29 (×8): 474 mL
  Filled 2017-07-27 (×17): qty 474

## 2017-07-27 MED ORDER — ASPIRIN 81 MG PO CHEW
324.0000 mg | CHEWABLE_TABLET | Freq: Every day | ORAL | Status: DC
  Administered 2017-07-27 – 2017-07-29 (×3): 324 mg via ORAL
  Filled 2017-07-27 (×2): qty 4

## 2017-07-27 MED ORDER — SODIUM POLYSTYRENE SULFONATE 15 GM/60ML PO SUSP: 30.0000 g | Freq: Once | ORAL | Status: DC

## 2017-07-27 MED ORDER — IPRATROPIUM-ALBUTEROL 0.5-2.5 (3) MG/3ML IN SOLN
3.0000 mL | Freq: Three times a day (TID) | RESPIRATORY_TRACT | Status: AC
  Administered 2017-07-27 – 2017-07-28 (×2): 3 mL via RESPIRATORY_TRACT
  Filled 2017-07-27 (×3): qty 3

## 2017-07-27 MED ORDER — ASPIRIN 300 MG RE SUPP: 300.0000 mg | Freq: Every day | RECTAL | Status: DC

## 2017-07-27 MED ORDER — SODIUM CHLORIDE 0.9 % IV SOLN
INTRAVENOUS | Status: DC
  Administered 2017-07-27 – 2017-07-28 (×2): via INTRAVENOUS

## 2017-07-27 NOTE — Progress Notes (Signed)
Initial Nutrition Assessment  DOCUMENTATION CODES:   Not applicable  INTERVENTION:   -Continue bolus feedings of 474 ml Jevity 1.2 QID via PEG  Tube feeding regimen provides 2275 kcal (100% of needs), 105 grams of protein, and 1530 ml of H2O.   NUTRITION DIAGNOSIS:   Inadequate oral intake related to dysphagia, inability to eat as evidenced by NPO status.  GOAL:   Patient will meet greater than or equal to 90% of their needs  MONITOR:   Labs, Weight trends, TF tolerance, Skin, I & O's  REASON FOR ASSESSMENT:   New TF    ASSESSMENT:   Nicholas Cobb is a 67 y.o. male with medical history significant of laryngeal cancer status post radiation, now dependent on trach and PEG, hypertension, diet-controlled diabetes, GERD, pulmonary emphysema, former smoker, who presents with shortness of breath.  Pt admitted with acute respiratory failure with hypoxia.   Pt with pre-existing PEG and trach. Currently on trach collar.   Pt unable to provide hx. Reviewed wt hx; wt has been stable for the past 8 months.   Current TF regimen 474 ml Jevity 1.2 QID, which provides 2275 kcals, 105 grams protein, and 15 30 ml free water.   Medications reviewed and include solu-medrol.  Labs reviewed: Na: 132 (on IV supplementation), K: 5.3.   NUTRITION - FOCUSED PHYSICAL EXAM:    Most Recent Value  Orbital Region  No depletion  Upper Arm Region  No depletion  Thoracic and Lumbar Region  No depletion  Buccal Region  No depletion  Temple Region  No depletion  Clavicle Bone Region  No depletion  Clavicle and Acromion Bone Region  No depletion  Scapular Bone Region  No depletion  Dorsal Hand  No depletion  Patellar Region  No depletion  Anterior Thigh Region  No depletion  Posterior Calf Region  No depletion  Edema (RD Assessment)  None  Hair  Reviewed  Eyes  Reviewed  Mouth  Reviewed  Skin  Reviewed  Nails  Reviewed       Diet Order:   Diet Order           Diet NPO time  specified Except for: Other (See Comments)  Diet effective now          EDUCATION NEEDS:   No education needs have been identified at this time  Skin:  Skin Assessment: Reviewed RN Assessment  Last BM:  PTA  Height:   Ht Readings from Last 1 Encounters:  07/26/17 5\' 10"  (1.778 m)    Weight:   Wt Readings from Last 1 Encounters:  07/26/17 159 lb (72.1 kg)    Ideal Body Weight:  75.5 kg  BMI:  Body mass index is 22.81 kg/m.  Estimated Nutritional Needs:   Kcal:  2150-2350  Protein:  100-115 grams  Fluid:  > 2.1 L    Nicholas Cobb A. Jimmye Norman, RD, LDN, CDE Pager: [phone] After hours Pager: [phone]

## 2017-07-27 NOTE — ED Notes (Signed)
Dr. [name] paged to [place], RN-paged by Levada Dy

## 2017-07-27 NOTE — ED Notes (Signed)
Pt requested that we call [facility] and let them know that he would not be able to make his appointment this afternoon. Called and left message on answering machine as office is closed. Pt udpated.

## 2017-07-27 NOTE — Progress Notes (Signed)
Patient refused treatment at this time, and refused to let me suction him. Patient seems a little agitated at this time, but no distress noted.

## 2017-07-27 NOTE — Progress Notes (Signed)
Echocardiogram 2D Echocardiogram has been performed.  Nicholas Cobb 07/27/2017, 2:16 PM

## 2017-07-27 NOTE — ED Notes (Signed)
Daughter contacted at number provided by pt, message left. Pt updated

## 2017-07-27 NOTE — Progress Notes (Signed)
Trach care performed. Pt taken off vent, placed on 28% ATC. Pt then transported from [place] to B15 without incidence.

## 2017-07-27 NOTE — ED Notes (Signed)
Spoke with Dr [name] in regards to trying to downgrade pt to telemetry; Per Dr. [name] pt will stay stepdown and per Dr. [name] hold Kayexalate

## 2017-07-27 NOTE — ED Notes (Signed)
Attempted to give report to floor nurse; nurse not available at this time ; will call back in 5 min 

## 2017-07-27 NOTE — Progress Notes (Signed)
PROGRESS NOTE  Nicholas Cobb  VHQ:[id number] DOB: 1950/09/17 DOA: 07/26/2017 PCP: Rogers Blocker, MD   Brief Narrative: Nicholas Cobb is a 67 y.o. male with a history of tobacco use, emphysema, laryngeal CA s/p radiation therapy with chronic facial edema, s/p tracheostomy and PEG placement, HTN, and diet-controlled T2DM who presented to the ED with respiratory distress. He had requested trach collar change earlier that day in ED but returned 6/13 evening by EMS due to worsening shortness of breath. On arrival he was hypoxic into 70%'s with labored tachypnea, wheezing and rhonchi. He was placed on pressure support vent. He was hypertensive to 212/144 and rapidly returned to borderline hypotension with initiation of NTG gtt which was stopped. CXR demonstrated bibasilar opacifications concerning for pneumonia so cultures sent, antibiotics started. Troponin was elevated at 0.08 in the absence of chest pain or ischemic ECG changes. IV solumedrol and nebulized therapies were administered for presumed COPD exacerbation.   Assessment & Plan: Principal Problem:   Acute on chronic respiratory failure with hypoxia (HCC) Active Problems:   Tracheostomy in place [facility])   Laryngeal cancer ([place])   Encounter for PEG (percutaneous endoscopic gastrostomy) ([place])   Pulmonary emphysema (HCC)   GERD (gastroesophageal reflux disease)   Diabetes mellitus without complication (HCC)   Hyponatremia   Essential hypertension   Hypertensive urgency   Elevated troponin   Hyperkalemia  Acute on chronic hypoxic respiratory failure: Suspected due to COPD exacerbation and bibasilar pneumonia. BNP 11.8. RVP negative. HIV NR.  - Continue supplemental oxygen and/or vent support if needed - Continue solumedrol, taper as able based on exam. Wheezing improved. - Continue scheduled and prn breathing treatments - Ceftriaxone/azithromycin 6/13 >>  - Monitor blood and sputum cultures - Appreciate respiratory therapy  assistance, suction prn  Hyperkalemia: Mild. 5.4 > 5.3 prior to time kayexalate would have worked.  - Recheck this PM and repeat kayexalate if still elevated.   HTN with hypertensive urgency:  - DC'ed NTG gtt with overly rapid correction.  - Started norvasc. If rises would restart ARB as well.  - prn hydralazine  Diet-controlled T2DM: HbA1c 6.7% - May benefit from Tx  Elevated troponin: Downward trend. No chest pain, so suspect demand ischemia due to HTN urgency. Previous baseline appears to be very mildly elevated. - Echocardiogram ordered and pending.  Hyponatremia: Possibly related to ARB. TSH wnl.  - Monitor  Laryngeal CA: s/p remote XRT followed by ENT at [facility]. s/p trach and PEG.  - Continue trach care per respiratory and jevity tube feeding as usual   DVT prophylaxis: Lovenox Code Status: Full Family Communication: None at bedside, declined offer to call.  Disposition Plan: Uncertain  Consultants:   None  Procedures:   None  Antimicrobials:  Ceftriaxone 6/13 >>   Azithromycin 6/13 >>    Subjective: Patient communicates by writing in notebook. Denies chest pain or current dyspnea (recently removed from vent). Reports increased trach secretions for which he initially presented to ED for trach collar change-out.   Objective: Vitals:   07/27/17 1300 07/27/17 1315 07/27/17 1330 07/27/17 1345  BP: 118/67 127/78 121/71 116/77  Pulse: 88 90 84 85  Resp: (!) 21 (!) 29 (!) 24 (!) 23  SpO2: 97% 97% 96% 97%  Weight:      Height:        Intake/Output Summary (Last 24 hours) at 07/27/2017 1534 Last data filed at 07/27/2017 0649 Gross per 24 hour  Intake 934.17 ml  Output -  Net 934.17 ml  Filed Weights   07/26/17 1939  Weight: 72.1 kg (159 lb)    Gen: 67 y.o. male in no distress HEENT: Facial edema diffusely severe on bilateral lips with dried blood. No epistaxis noted.  Pulm: Non-labored breathing through trach with no significant secretions. Mild  end-expiratory wheezing bilaterally, decreased diffusely worst at bases. CV: Regular rate and rhythm. No murmur, rub, or gallop. No JVD, no pedal edema. GI: Abdomen soft, non-tender, non-distended, with normoactive bowel sounds. No organomegaly or masses felt. Ext: Warm, no deformities Skin: PEG site c/d/i without significant surrounding erythema or tenderness. Neuro: Alert and oriented. No vocalization but moves all extremities.  Psych: Judgement and insight appear normal. Mood & affect appropriate.   Data Reviewed: I have personally reviewed following labs and imaging studies  CBC: Recent Labs  Lab 07/26/17 1915  WBC 7.9  NEUTROABS 5.8  HGB 13.9  HCT 43.5  MCV 88.1  PLT 993   Basic Metabolic Panel: Recent Labs  Lab 07/26/17 1915 07/26/17 2335  NA 129* 132*  K 5.4* 5.3*  CL 93* 97*  CO2 25 28  GLUCOSE 175* 120*  BUN 16 15  CREATININE 0.86 0.93  CALCIUM 9.3 8.6*   GFR: Estimated Creatinine Clearance: 76.5 mL/min (by C-G formula based on SCr of 0.93 mg/dL). Liver Function Tests: No results for input(s): AST, ALT, ALKPHOS, BILITOT, PROT, ALBUMIN in the last 168 hours. No results for input(s): LIPASE, AMYLASE in the last 168 hours. No results for input(s): AMMONIA in the last 168 hours. Coagulation Profile: No results for input(s): INR, PROTIME in the last 168 hours. Cardiac Enzymes: Recent Labs  Lab 07/26/17 2121 07/27/17 0336 07/27/17 0901  TROPONINI 0.08* 0.07* 0.05*   BNP (last 3 results) No results for input(s): PROBNP in the last 8760 hours. HbA1C: Recent Labs    07/27/17 0336  HGBA1C 6.7*   CBG: Recent Labs  Lab 07/27/17 0833  GLUCAP 167*   Lipid Profile: Recent Labs    07/27/17 0336  CHOL 125  HDL 52  LDLCALC 68  TRIG 25  CHOLHDL 2.4   Thyroid Function Tests: Recent Labs    07/27/17 0336  TSH 1.188   Anemia Panel: No results for input(s): VITAMINB12, FOLATE, FERRITIN, TIBC, IRON, RETICCTPCT in the last 72 hours. Urine analysis:     Component Value Date/Time   COLORURINE YELLOW 09/14/2015 0910   APPEARANCEUR CLEAR 09/14/2015 0910   LABSPEC 1.013 09/14/2015 0910   PHURINE 7.5 09/14/2015 0910   GLUCOSEU NEGATIVE 09/14/2015 0910   GLUCOSEU NEGATIVE 10/29/2013 1118   HGBUR NEGATIVE 09/14/2015 0910   BILIRUBINUR NEGATIVE 09/14/2015 0910   BILIRUBINUR Negative 10/08/2014 0930   KETONESUR NEGATIVE 09/14/2015 0910   PROTEINUR NEGATIVE 09/14/2015 0910   UROBILINOGEN 0.2 10/08/2014 0930   UROBILINOGEN 0.2 10/29/2013 1118   NITRITE NEGATIVE 09/14/2015 0910   LEUKOCYTESUR NEGATIVE 09/14/2015 0910   Recent Results (from the past 240 hour(s))  Blood culture (routine x 2)     Status: None (Preliminary result)   Collection Time: 07/26/17  8:18 PM  Result Value Ref Range Status   Specimen Description BLOOD BLOOD LEFT FOREARM  Final   Special Requests   Final    BOTTLES DRAWN AEROBIC AND ANAEROBIC Blood Culture results may not be optimal due to an inadequate volume of blood received in culture bottles   Culture   Final    NO GROWTH < 24 HOURS Performed at [facility], [place] [address] [place], [place] [address]    Report Status  PENDING  Incomplete  Blood culture (routine x 2)     Status: None (Preliminary result)   Collection Time: 07/26/17  8:25 PM  Result Value Ref Range Status   Specimen Description BLOOD LEFT ANTECUBITAL  Final   Special Requests   Final    BOTTLES DRAWN AEROBIC AND ANAEROBIC Blood Culture results may not be optimal due to an excessive volume of blood received in culture bottles   Culture   Final    NO GROWTH < 24 HOURS Performed at [place] [address] [place], [place] [address]    Report Status PENDING  Incomplete  Respiratory Panel by PCR     Status: None   Collection Time: 07/26/17  8:55 PM  Result Value Ref Range Status   Adenovirus NOT DETECTED NOT DETECTED Final   Coronavirus 229E NOT DETECTED NOT DETECTED Final   Coronavirus HKU1 NOT DETECTED NOT DETECTED Final    Coronavirus NL63 NOT DETECTED NOT DETECTED Final   Coronavirus OC43 NOT DETECTED NOT DETECTED Final   Metapneumovirus NOT DETECTED NOT DETECTED Final   Rhinovirus / Enterovirus NOT DETECTED NOT DETECTED Final   Influenza A NOT DETECTED NOT DETECTED Final   Influenza B NOT DETECTED NOT DETECTED Final   Parainfluenza Virus 1 NOT DETECTED NOT DETECTED Final   Parainfluenza Virus 2 NOT DETECTED NOT DETECTED Final   Parainfluenza Virus 3 NOT DETECTED NOT DETECTED Final   Parainfluenza Virus 4 NOT DETECTED NOT DETECTED Final   Respiratory Syncytial Virus NOT DETECTED NOT DETECTED Final   Bordetella pertussis NOT DETECTED NOT DETECTED Final   Chlamydophila pneumoniae NOT DETECTED NOT DETECTED Final   Mycoplasma pneumoniae NOT DETECTED NOT DETECTED Final    Comment: Performed at [facility], [place] [address] [place], [place] [address]  Culture, respiratory (NON-Expectorated)     Status: None (Preliminary result)   Collection Time: 07/26/17 10:39 PM  Result Value Ref Range Status   Specimen Description TRACHEAL ASPIRATE  Final   Special Requests NONE  Final   Gram Stain   Final    FEW WBC PRESENT, PREDOMINANTLY PMN RARE SQUAMOUS EPITHELIAL CELLS PRESENT MODERATE GRAM POSITIVE COCCI MODERATE GRAM POSITIVE RODS RARE GRAM NEGATIVE RODS Performed at [facility], [place] [address] [place],  [address]    Culture PENDING  Incomplete   Report Status PENDING  Incomplete      Radiology Studies: Dg Chest Port 1 View  Result Date: 07/26/2017 CLINICAL DATA:  Shortness of breath with wheezing. EXAM: PORTABLE CHEST 1 VIEW COMPARISON:  06/30/2017 and 03/04/2017 FINDINGS: Lungs are adequately inflated demonstrate bibasilar mixed interstitial airspace density which may be due to early infection versus atelectasis. No effusion. Cardiomediastinal silhouette and remainder of the exam is unchanged. IMPRESSION: Mild bibasilar opacification which may be due to infection versus atelectasis.  Recommend follow-up to resolution. Electronically Signed   By: Marin Olp M.D.   On: 07/26/2017 19:31    Scheduled Meds: . amLODipine  10 mg Per Tube Daily  . aspirin  324 mg Oral Daily  . enoxaparin (LOVENOX) injection  40 mg Subcutaneous Q24H  . famotidine  20 mg Per Tube BID  . feeding supplement (JEVITY 1.2 CAL)  474 mL Per Tube Q6H  . methylPREDNISolone (SOLU-MEDROL) injection  60 mg Intravenous TID  . sodium polystyrene  30 g Per Tube Once  . triamcinolone cream  1 application Topical BID   Continuous Infusions: . sodium chloride Stopped (07/27/17 0649)  . sodium chloride 75  mL/hr at 07/27/17 0544  . azithromycin    . cefTRIAXone (ROCEPHIN)  IV       LOS: 1 day   Time spent: 25 minutes.  Patrecia Pour, MD [facility] [email or web address] Password [facility] 07/27/2017, 3:34 PM

## 2017-07-28 LAB — BASIC METABOLIC PANEL
ANION GAP: 8 (ref 5–15)
BUN: 16 mg/dL (ref 6–20)
CHLORIDE: 95 mmol/L — AB (ref 101–111)
CO2: 27 mmol/L (ref 22–32)
Calcium: 8.8 mg/dL — ABNORMAL LOW (ref 8.9–10.3)
Creatinine, Ser: 0.78 mg/dL (ref 0.61–1.24)
GFR calc non Af Amer: >60 mL/min (ref >60)
GLUCOSE: 241 mg/dL — AB (ref 65–99)
POTASSIUM: 4.2 mmol/L (ref 3.5–5.1)
Sodium: 130 mmol/L — ABNORMAL LOW (ref 135–145)

## 2017-07-28 LAB — BLOOD CULTURE ID PANEL (REFLEXED)
ACINETOBACTER BAUMANNII: NOT DETECTED
CANDIDA ALBICANS: NOT DETECTED
CANDIDA GLABRATA: NOT DETECTED
CANDIDA PARAPSILOSIS: NOT DETECTED
Candida krusei: NOT DETECTED
Candida tropicalis: NOT DETECTED
ENTEROBACTER CLOACAE COMPLEX: NOT DETECTED
ENTEROBACTERIACEAE SPECIES: NOT DETECTED
ENTEROCOCCUS SPECIES: NOT DETECTED
ESCHERICHIA COLI: NOT DETECTED
Haemophilus influenzae: NOT DETECTED
KLEBSIELLA OXYTOCA: NOT DETECTED
Klebsiella pneumoniae: NOT DETECTED
LISTERIA MONOCYTOGENES: NOT DETECTED
Neisseria meningitidis: NOT DETECTED
Proteus species: NOT DETECTED
Pseudomonas aeruginosa: NOT DETECTED
SERRATIA MARCESCENS: NOT DETECTED
STAPHYLOCOCCUS AUREUS BCID: NOT DETECTED
STREPTOCOCCUS PNEUMONIAE: NOT DETECTED
STREPTOCOCCUS PYOGENES: NOT DETECTED
Staphylococcus species: NOT DETECTED
Streptococcus agalactiae: NOT DETECTED
Streptococcus species: NOT DETECTED

## 2017-07-28 LAB — LEGIONELLA PNEUMOPHILA SEROGP 1 UR AG: L. pneumophila Serogp 1 Ur Ag: NEGATIVE

## 2017-07-28 LAB — GLUCOSE, CAPILLARY
GLUCOSE-CAPILLARY: 114 mg/dL — AB (ref 65–99)
Glucose-Capillary: 135 mg/dL — ABNORMAL HIGH (ref 65–99)

## 2017-07-28 MED ORDER — LIP MEDEX EX OINT
1.0000 "application " | TOPICAL_OINTMENT | CUTANEOUS | Status: DC | PRN
  Administered 2017-07-28: 1 via TOPICAL
  Filled 2017-07-28: qty 7

## 2017-07-28 MED ORDER — LOSARTAN POTASSIUM 50 MG PO TABS
50.0000 mg | ORAL_TABLET | Freq: Every day | ORAL | Status: DC
  Administered 2017-07-28 – 2017-07-29 (×2): 50 mg
  Filled 2017-07-28: qty 1

## 2017-07-28 MED ORDER — PREDNISONE 20 MG PO TABS
40.0000 mg | ORAL_TABLET | Freq: Every day | ORAL | Status: DC
  Administered 2017-07-29: 40 mg
  Filled 2017-07-28 (×2): qty 2

## 2017-07-28 MED ORDER — IPRATROPIUM-ALBUTEROL 0.5-2.5 (3) MG/3ML IN SOLN
RESPIRATORY_TRACT | Status: AC
  Filled 2017-07-28: qty 3

## 2017-07-28 MED ORDER — IPRATROPIUM-ALBUTEROL 0.5-2.5 (3) MG/3ML IN SOLN: 3.0000 mL | Freq: Three times a day (TID) | RESPIRATORY_TRACT | Status: DC

## 2017-07-28 MED ORDER — METHYLPREDNISOLONE SODIUM SUCC 40 MG IJ SOLR
40.0000 mg | Freq: Two times a day (BID) | INTRAMUSCULAR | Status: DC
  Administered 2017-07-28: 40 mg via INTRAVENOUS
  Filled 2017-07-28: qty 1

## 2017-07-28 MED ORDER — ORAL CARE MOUTH RINSE
15.0000 mL | Freq: Two times a day (BID) | OROMUCOSAL | Status: DC
  Administered 2017-07-28: 15 mL via OROMUCOSAL

## 2017-07-28 MED ORDER — LEVOFLOXACIN 25 MG/ML PO SOLN
750.0000 mg | Freq: Every day | ORAL | Status: DC
  Administered 2017-07-28: 750 mg
  Filled 2017-07-28: qty 30

## 2017-07-28 MED ORDER — CHLORHEXIDINE GLUCONATE 0.12 % MT SOLN
15.0000 mL | Freq: Two times a day (BID) | OROMUCOSAL | Status: DC
  Administered 2017-07-28 – 2017-07-29 (×2): 15 mL via OROMUCOSAL
  Filled 2017-07-28 (×2): qty 15

## 2017-07-28 MED ORDER — ALUM & MAG HYDROXIDE-SIMETH 200-200-20 MG/5ML PO SUSP
30.0000 mL | ORAL | Status: DC | PRN
  Administered 2017-07-28: 30 mL via ORAL
  Filled 2017-07-28: qty 30

## 2017-07-28 MED ORDER — IPRATROPIUM-ALBUTEROL 0.5-2.5 (3) MG/3ML IN SOLN
3.0000 mL | Freq: Three times a day (TID) | RESPIRATORY_TRACT | Status: DC
  Administered 2017-07-28 – 2017-07-29 (×2): 3 mL via RESPIRATORY_TRACT
  Filled 2017-07-28 (×3): qty 3

## 2017-07-28 NOTE — Progress Notes (Addendum)
PROGRESS NOTE  Nicholas Cobb  WVP:[id number] DOB: Aug 09, 1950 DOA: 07/26/2017 PCP: Rogers Blocker, MD   Brief Narrative: Nicholas Cobb is a 67 y.o. male with a history of tobacco use, emphysema, laryngeal CA s/p radiation therapy with chronic facial edema, s/p tracheostomy and PEG placement, HTN, and diet-controlled T2DM who presented to the ED with respiratory distress. He had requested trach collar change earlier that day in ED but returned 6/13 evening by EMS due to worsening shortness of breath. On arrival he was hypoxic into 70%'s with labored tachypnea, wheezing and rhonchi. He was placed on pressure support vent. He was hypertensive to 212/144 and rapidly returned to borderline hypotension with initiation of NTG gtt which was stopped. CXR demonstrated bibasilar opacifications concerning for pneumonia so cultures sent, antibiotics started. Troponin was elevated at 0.08 in the absence of chest pain or ischemic ECG changes. IV solumedrol and nebulized therapies were administered for presumed COPD exacerbation.   Assessment & Plan: Principal Problem:   Acute on chronic respiratory failure with hypoxia (HCC) Active Problems:   Tracheostomy in place [facility])   Laryngeal cancer ([place])   Encounter for PEG (percutaneous endoscopic gastrostomy) ([place])   Pulmonary emphysema (HCC)   GERD (gastroesophageal reflux disease)   Diabetes mellitus without complication (HCC)   Hyponatremia   Essential hypertension   Hypertensive urgency   Elevated troponin   Hyperkalemia  Acute on chronic hypoxic respiratory failure: Suspected due to COPD exacerbation and bibasilar pneumonia. BNP 11.8. RVP negative. HIV NR.  - Continue supplemental oxygen and/or vent support if needed - Taper IV steroids to prednisone in AM as long as wheezing continues to improve.  - Continue scheduled and prn breathing treatments - Ceftriaxone/azithromycin 6/13 > levaquin 6/15 with planned 7 day course.  - Monitor sputum cultures  (mod GPR, GPC, reincubated). Neg MRSA PCR. - Appreciate respiratory therapy assistance, suction prn  Gram positive rods in 1 of 4 blood cultures: No recent dental procedures or evidence of infection from B. cereus, corynebacterium, clostridium or listeria. No leukocytosis or fever, so suspect this to be contaminant.  - Follow up final culture results and other cultures (no growth x2 days).   Hyperkalemia: Mild, resolved with kayexalate. - Would monitor BMP closely, ?if ARB caused this.   HTN with hypertensive urgency: Urgency rapidly resolved. - Started norvasc. Still elevated, will restart ARB at half dose (on high dose 100mg ).  - prn hydralazine  Diet-controlled T2DM: HbA1c 6.7% - May benefit from Tx  Elevated troponin: Downward trend. No chest pain, so suspect demand ischemia due to HTN urgency. Previous baseline appears to be very mildly elevated. - Echocardiogram without wall motion abnormalities. No further intervention planned.  Hyponatremia: Possibly related to ARB. TSH wnl.  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 s/p trach and PEG.  - Continue trach care per respiratory and jevity tube feeding as usual  DVT prophylaxis: Lovenox Code Status: Full Family Communication: None at bedside, declined offer to call.  Disposition Plan: Home if continues rate of improvement 6/16 on levaquin and prednisone. Pt's pharmacy is open 9a-7p on Sundays.   Consultants:   None  Procedures:   Echocardiogram 07/27/2017: - Left ventricle: The cavity size was normal. Wall thickness was   normal. Systolic function was normal. The estimated ejection   fraction was in the range of 60% to 65%. Wall motion was normal;   there were no regional wall motion abnormalities. Doppler  parameters are consistent with abnormal left ventricular   relaxation (grade 1 diastolic dysfunction).  Antimicrobials:  Ceftriaxone 6/13 >>    Azithromycin 6/13 >>    Subjective: Communication through back and forth writing on notebook paper. Still coughing more secretions than usual. Wheezing is improved. No fevers, breathing effort is improving but not at baseline.  Objective: Vitals:   07/28/17 0929 07/28/17 0944 07/28/17 1136 07/28/17 1159  BP:    (!) 161/90  Pulse:    87  Resp:    (!) 21  Temp:    97.8 F (36.6 C)  TempSrc:    Oral  SpO2: 100% 100% 96% 98%  Weight:      Height:        Intake/Output Summary (Last 24 hours) at 07/28/2017 1446 Last data filed at 07/28/2017 1043 Gross per 24 hour  Intake 2939.45 ml  Output 1160 ml  Net 1779.45 ml   Filed Weights   07/26/17 1939  Weight: 72.1 kg (159 lb)   Gen: 67 y.o. male in no distress HEENT: Extensive edema involving mandible extending up the face including periorbitally. Lip edema stable no bleeding from mucous membranes noted. Neck: Trach in normal position with minimal secretions.  Pulm: Nonlabored breathing through trach humidified O2. diminished and wheezing improved, scattered at end expiration. CV: Regular rate and rhythm. No murmur, rub, or gallop. No JVD, no dependent edema. GI: Abdomen soft, non-tender, non-distended, with normoactive bowel sounds.  Ext: Warm, no deformities Skin: PEG site wnl. Neuro: Alert and oriented. No vocalization but handwriting is legible and thought process is linear, no focal neurological deficits. Psych: Judgement and insight appear fair. Mood euthymic & affect congruent. Behavior is appropriate.    CBC: Recent Labs  Lab 07/26/17 1915  WBC 7.9  NEUTROABS 5.8  HGB 13.9  HCT 43.5  MCV 88.1  PLT 629   Basic Metabolic Panel: Recent Labs  Lab 07/26/17 1915 07/26/17 2335 07/27/17 1704 07/28/17 0356  NA 129* 132* 133* 130*  K 5.4* 5.3* 4.2 4.2  CL 93* 97* 96* 95*  CO2 25 28 29 27   GLUCOSE 175* 120* 138* 241*  BUN 16 15 15 16   CREATININE 0.86 0.93 0.81 0.78  CALCIUM 9.3 8.6* 9.1 8.8*   GFR: Estimated  Creatinine Clearance: 88.9 mL/min (by C-G formula based on SCr of 0.78 mg/dL).  Cardiac Enzymes: Recent Labs  Lab 07/26/17 2121 07/27/17 0336 07/27/17 0901  TROPONINI 0.08* 0.07* 0.05*   HbA1C: Recent Labs    07/27/17 0336  HGBA1C 6.7*   CBG: Recent Labs  Lab 07/27/17 0833 07/27/17 1647 07/28/17 0647 07/28/17 0754  GLUCAP 167* 147* 135* 114*   Lipid Profile: Recent Labs    07/27/17 0336  CHOL 125  HDL 52  LDLCALC 68  TRIG 25  CHOLHDL 2.4   Thyroid Function Tests: Recent Labs    07/27/17 0336  TSH 1.188   Anemia Panel: No results for input(s): VITAMINB12, FOLATE, FERRITIN, TIBC, IRON, RETICCTPCT in the last 72 hours. Urine analysis:    Component Value Date/Time   COLORURINE YELLOW 09/14/2015 0910   APPEARANCEUR CLEAR 09/14/2015 0910   LABSPEC 1.013 09/14/2015 0910   PHURINE 7.5 09/14/2015 0910   GLUCOSEU NEGATIVE 09/14/2015 0910   GLUCOSEU NEGATIVE 10/29/2013 1118   HGBUR NEGATIVE 09/14/2015 0910   BILIRUBINUR NEGATIVE 09/14/2015 0910   BILIRUBINUR Negative 10/08/2014 0930   KETONESUR NEGATIVE 09/14/2015 0910   PROTEINUR NEGATIVE 09/14/2015 0910   UROBILINOGEN 0.2 10/08/2014 0930   UROBILINOGEN 0.2 10/29/2013 1118  NITRITE NEGATIVE 09/14/2015 0910   LEUKOCYTESUR NEGATIVE 09/14/2015 0910   Recent Results (from the past 240 hour(s))  Blood culture (routine x 2)     Status: None (Preliminary result)   Collection Time: 07/26/17  8:18 PM  Result Value Ref Range Status   Specimen Description BLOOD BLOOD LEFT FOREARM  Final   Special Requests   Final    BOTTLES DRAWN AEROBIC AND ANAEROBIC Blood Culture results may not be optimal due to an inadequate volume of blood received in culture bottles   Culture   Final    NO GROWTH 2 DAYS Performed at [place] [address], [place], [place] [address]    Report Status PENDING  Incomplete  Blood culture (routine x 2)     Status: None (Preliminary result)   Collection Time: 07/26/17  8:25 PM    Result Value Ref Range Status   Specimen Description BLOOD LEFT ANTECUBITAL  Final   Special Requests   Final    BOTTLES DRAWN AEROBIC AND ANAEROBIC Blood Culture results may not be optimal due to an excessive volume of blood received in culture bottles   Culture  Setup Time   Final    GRAM POSITIVE RODS ANAEROBIC BOTTLE ONLY CRITICAL RESULT CALLED TO, READ BACK BY AND VERIFIED WITH: E MARTIN,PHARMD AT 1401 07/28/17 BY L BENFIELD Performed at [facility], [place] [address], [place], [place] [address]    Culture GRAM POSITIVE RODS  Final   Report Status PENDING  Incomplete  Blood Culture ID Panel (Reflexed)     Status: None   Collection Time: 07/26/17  8:25 PM  Result Value Ref Range Status   Enterococcus species NOT DETECTED NOT DETECTED Final   Listeria monocytogenes NOT DETECTED NOT DETECTED Final   Staphylococcus species NOT DETECTED NOT DETECTED Final   Staphylococcus aureus NOT DETECTED NOT DETECTED Final   Streptococcus species NOT DETECTED NOT DETECTED Final   Streptococcus agalactiae NOT DETECTED NOT DETECTED Final   Streptococcus pneumoniae NOT DETECTED NOT DETECTED Final   Streptococcus pyogenes NOT DETECTED NOT DETECTED Final   Acinetobacter baumannii NOT DETECTED NOT DETECTED Final   Enterobacteriaceae species NOT DETECTED NOT DETECTED Final   Enterobacter cloacae complex NOT DETECTED NOT DETECTED Final   Escherichia coli NOT DETECTED NOT DETECTED Final   Klebsiella oxytoca NOT DETECTED NOT DETECTED Final   Klebsiella pneumoniae NOT DETECTED NOT DETECTED Final   Proteus species NOT DETECTED NOT DETECTED Final   Serratia marcescens NOT DETECTED NOT DETECTED Final   Haemophilus influenzae NOT DETECTED NOT DETECTED Final   Neisseria meningitidis NOT DETECTED NOT DETECTED Final   Pseudomonas aeruginosa NOT DETECTED NOT DETECTED Final   Candida albicans NOT DETECTED NOT DETECTED Final   Candida glabrata NOT DETECTED NOT DETECTED Final   Candida krusei NOT DETECTED NOT  DETECTED Final   Candida parapsilosis NOT DETECTED NOT DETECTED Final   Candida tropicalis NOT DETECTED NOT DETECTED Final    Comment: Performed at [facility], [place]. [address] [place],  [address]  Respiratory Panel by PCR     Status: None   Collection Time: 07/26/17  8:55 PM  Result Value Ref Range Status   Adenovirus NOT DETECTED NOT DETECTED Final   Coronavirus 229E NOT DETECTED NOT DETECTED Final   Coronavirus HKU1 NOT DETECTED NOT DETECTED Final   Coronavirus NL63 NOT DETECTED NOT DETECTED Final   Coronavirus OC43 NOT DETECTED NOT DETECTED Final   Metapneumovirus NOT DETECTED NOT DETECTED Final  Rhinovirus / Enterovirus NOT DETECTED NOT DETECTED Final   Influenza A NOT DETECTED NOT DETECTED Final   Influenza B NOT DETECTED NOT DETECTED Final   Parainfluenza Virus 1 NOT DETECTED NOT DETECTED Final   Parainfluenza Virus 2 NOT DETECTED NOT DETECTED Final   Parainfluenza Virus 3 NOT DETECTED NOT DETECTED Final   Parainfluenza Virus 4 NOT DETECTED NOT DETECTED Final   Respiratory Syncytial Virus NOT DETECTED NOT DETECTED Final   Bordetella pertussis NOT DETECTED NOT DETECTED Final   Chlamydophila pneumoniae NOT DETECTED NOT DETECTED Final   Mycoplasma pneumoniae NOT DETECTED NOT DETECTED Final    Comment: Performed at [facility], [place] [address], [place], [place] [address]  Culture, respiratory (NON-Expectorated)     Status: None (Preliminary result)   Collection Time: 07/26/17 10:39 PM  Result Value Ref Range Status   Specimen Description TRACHEAL ASPIRATE  Final   Special Requests NONE  Final   Gram Stain   Final    FEW WBC PRESENT, PREDOMINANTLY PMN RARE SQUAMOUS EPITHELIAL CELLS PRESENT MODERATE GRAM POSITIVE COCCI MODERATE GRAM POSITIVE RODS RARE GRAM NEGATIVE RODS    Culture   Final    CULTURE REINCUBATED FOR BETTER GROWTH Performed at [facility], [place] [address], [place], [place] [address]    Report Status PENDING  Incomplete  MRSA PCR  Screening     Status: None   Collection Time: 07/27/17  2:46 PM  Result Value Ref Range Status   MRSA by PCR NEGATIVE NEGATIVE Final    Comment:        The GeneXpert MRSA Assay (FDA approved for NASAL specimens only), is one component of a comprehensive MRSA colonization surveillance program. It is not intended to diagnose MRSA infection nor to guide or monitor treatment for MRSA infections. Performed at [facility], [place] [address], [place], [place] [address]       Radiology Studies: Dg Chest Port 1 View  Result Date: 07/26/2017 CLINICAL DATA:  Shortness of breath with wheezing. EXAM: PORTABLE CHEST 1 VIEW COMPARISON:  06/30/2017 and 03/04/2017 FINDINGS: Lungs are adequately inflated demonstrate bibasilar mixed interstitial airspace density which may be due to early infection versus atelectasis. No effusion. Cardiomediastinal silhouette and remainder of the exam is unchanged. IMPRESSION: Mild bibasilar opacification which may be due to infection versus atelectasis. Recommend follow-up to resolution. Electronically Signed   By: Marin Olp M.D.   On: 07/26/2017 19:31    Scheduled Meds: . amLODipine  10 mg Per Tube Daily  . aspirin  324 mg Oral Daily  . chlorhexidine  15 mL Mouth Rinse BID  . enoxaparin (LOVENOX) injection  40 mg Subcutaneous Q24H  . famotidine  20 mg Per Tube BID  . feeding supplement (JEVITY 1.2 CAL)  474 mL Per Tube Q6H  . levofloxacin  750 mg Per Tube Daily  . mouth rinse  15 mL Mouth Rinse q12n4p  . methylPREDNISolone (SOLU-MEDROL) injection  40 mg Intravenous Q12H  . triamcinolone cream  1 application Topical BID   Continuous Infusions: . sodium chloride Stopped (07/27/17 0649)     LOS: 2 days   Time spent: 35 minutes.  Patrecia Pour, MD [facility] [email or web address] Password [facility] 07/28/2017, 2:46 PM

## 2017-07-28 NOTE — Progress Notes (Signed)
PHARMACY - PHYSICIAN COMMUNICATION CRITICAL VALUE ALERT - BLOOD CULTURE IDENTIFICATION (BCID)  Nicholas Cobb is an 67 y.o. male who presented to [facility] on 07/26/2017 with a chief complaint of SOB  Assessment: 78 YOM currently on Levaquin due to concern for PNA. Now with 1 of 2 BCx growing GPR with BCID showing undetected. This is likely representative of a contaminant such as bacillus.  Name of physician (or Provider) Contacted: [name]  Current antibiotics: Levaquin 750 mg PT daily  Changes to prescribed antibiotics recommended:  No additional antibiotics recommended at this time  Results for orders placed or performed during the hospital encounter of 07/26/17  Blood Culture ID Panel (Reflexed) (Collected: 07/26/2017  8:25 PM)  Result Value Ref Range   Enterococcus species NOT DETECTED NOT DETECTED   Listeria monocytogenes NOT DETECTED NOT DETECTED   Staphylococcus species NOT DETECTED NOT DETECTED   Staphylococcus aureus NOT DETECTED NOT DETECTED   Streptococcus species NOT DETECTED NOT DETECTED   Streptococcus agalactiae NOT DETECTED NOT DETECTED   Streptococcus pneumoniae NOT DETECTED NOT DETECTED   Streptococcus pyogenes NOT DETECTED NOT DETECTED   Acinetobacter baumannii NOT DETECTED NOT DETECTED   Enterobacteriaceae species NOT DETECTED NOT DETECTED   Enterobacter cloacae complex NOT DETECTED NOT DETECTED   Escherichia coli NOT DETECTED NOT DETECTED   Klebsiella oxytoca NOT DETECTED NOT DETECTED   Klebsiella pneumoniae NOT DETECTED NOT DETECTED   Proteus species NOT DETECTED NOT DETECTED   Serratia marcescens NOT DETECTED NOT DETECTED   Haemophilus influenzae NOT DETECTED NOT DETECTED   Neisseria meningitidis NOT DETECTED NOT DETECTED   Pseudomonas aeruginosa NOT DETECTED NOT DETECTED   Candida albicans NOT DETECTED NOT DETECTED   Candida glabrata NOT DETECTED NOT DETECTED   Candida krusei NOT DETECTED NOT DETECTED   Candida parapsilosis NOT DETECTED NOT DETECTED   Candida tropicalis NOT DETECTED NOT DETECTED    Lawson Radar 07/28/2017  2:02 PM

## 2017-07-29 LAB — GLUCOSE, CAPILLARY: Glucose-Capillary: 117 mg/dL — ABNORMAL HIGH (ref 65–99)

## 2017-07-29 LAB — CULTURE, RESPIRATORY: CULTURE: NORMAL

## 2017-07-29 LAB — BASIC METABOLIC PANEL
Anion gap: 6 (ref 5–15)
BUN: 18 mg/dL (ref 6–20)
CALCIUM: 8.9 mg/dL (ref 8.9–10.3)
CHLORIDE: 98 mmol/L — AB (ref 101–111)
CO2: 28 mmol/L (ref 22–32)
CREATININE: 0.81 mg/dL (ref 0.61–1.24)
GFR calc Af Amer: >60 mL/min (ref >60)
GFR calc non Af Amer: >60 mL/min (ref >60)
GLUCOSE: 158 mg/dL — AB (ref 65–99)
Potassium: 4.3 mmol/L (ref 3.5–5.1)
Sodium: 132 mmol/L — ABNORMAL LOW (ref 135–145)

## 2017-07-29 LAB — CULTURE, RESPIRATORY W GRAM STAIN

## 2017-07-29 MED ORDER — LEVOFLOXACIN 25 MG/ML PO SOLN: 750.0000 mg | Freq: Every day | ORAL | 0 refills | Status: AC

## 2017-07-29 MED ORDER — PREDNISONE 20 MG PO TABS: 40.0000 mg | ORAL_TABLET | Freq: Every day | ORAL | 0 refills | Status: DC

## 2017-07-29 NOTE — Discharge Summary (Signed)
Physician Discharge Summary  [name] HDQ:[id number] DOB: 1950-10-28 DOA: 07/26/2017  PCP: Rogers Blocker, MD  Admit date: 07/26/2017 Discharge date: 07/29/2017  Admitted From: Home Disposition: Home   Recommendations for Outpatient Follow-up:  1. Follow up with PCP in 1-2 weeks 2. Follow up in trach clinic at [facility]. 3. Follow up BMP, had chronic hyponatremia and idiopathic hyperkalemia transiently, consider change of ARB if returns. Cr wnl.  4. Norvasc was given for elevated blood pressure with improved control while admitted. Recheck at follow up.   Home Health: None new Equipment/Devices: None new Discharge Condition: Stable CODE STATUS: Full Diet recommendation: Jevity per tube  Brief/Interim Summary: Nicholas Cobb is a 67 y.o. male with a history of tobacco use, emphysema, laryngeal CA s/p radiation therapy with chronic facial edema, s/p tracheostomy and PEG placement, HTN, and diet-controlled T2DM who presented to the ED with respiratory distress. He had requested trach collar change earlier that day in ED but returned 6/13 evening by EMS due to worsening shortness of breath. On arrival he was hypoxic into 70%'s with labored tachypnea, wheezing and rhonchi. He was placed on pressure support vent. He was hypertensive to 212/144 and rapidly returned to borderline hypotension with initiation of NTG gtt which was stopped. CXR demonstrated bibasilar opacifications concerning for pneumonia so cultures sent, antibiotics started. Troponin was elevated at 0.08 in the absence of chest pain, ischemic ECG changes, or wall motion abnormalities on echo. IV solumedrol and nebulized therapies were administered for presumed COPD exacerbation with steady improvement. Antibiotics converted to per tube formulation and the patient is stable for discharge.   Discharge Diagnoses:  Principal Problem:   Acute on chronic respiratory failure with hypoxia (HCC) Active Problems:   Tracheostomy in  place [facility])   Laryngeal cancer ([place])   Encounter for PEG (percutaneous endoscopic gastrostomy) ([place])   Pulmonary emphysema (HCC)   GERD (gastroesophageal reflux disease)   Diabetes mellitus without complication (HCC)   Hyponatremia   Essential hypertension   Hypertensive urgency   Elevated troponin   Hyperkalemia  Acute on chronic hypoxic respiratory failure: Suspected due to COPD exacerbation and bibasilar pneumonia. BNP 11.8. RVP negative. HIV NR. Sputum Cx normal flora. Neg MRSA PCR. - Continue trach care at home, follow up with Marni Griffon in trach clinic. - Wheezing has resolved, will complete prednisone steroid burst - Continue scheduled and prn breathing treatments at home - Ceftriaxone/azithromycin 6/13 > levaquin 6/15 with planned 7 day course.   Gram positive rods in 1 of 4 blood cultures: No recent dental procedures or evidence of infection from B. cereus, corynebacterium, clostridium or listeria. No leukocytosis or fever, so suspect this to be contaminant. No rapid BCID detected. - Follow up final culture results and other cultures (no growth x3 days).   Hyperkalemia: Mild, resolved with kayexalate. - Would monitor BMP closely, ?if ARB caused this.   HTN with hypertensive urgency: Urgency rapidly resolved. - Started norvasc. Still elevated, will restart ARB at half dose (on high dose '100mg'$ ).  - prn hydralazine  Diet-controlled T2DM: HbA1c 6.7% - May benefit from Tx  Elevated troponin: Downward trend. No chest pain, so suspect demand ischemia due to HTN urgency. Previous baseline appears to be very mildly elevated. - Echocardiogram without wall motion abnormalities. No further intervention planned.  Hyponatremia: Possibly related to ARB. TSH wnl.  - Monitor  Laryngeal CA: s/p remote XRT complicated by osteoradionecrosis of the mandible, followed by ENT at [facility]. s/p trach and PEG.  - Continue  trach care (has set up at home) and jevity tube feeding as  usual  Discharge Instructions Discharge Instructions    Discharge instructions   Complete by:  As directed    You were admitted with pneumonia and wheezing that have improved. You are stable for discharge with the following recommendations:  - Continue taking medications as you were and also take levaquin and prednisone for the next 4 days. These were sent to your pharmacy. Each is once daily.  - Follow up with your PCP in the next 1-2 weeks. Your blood pressure was a bit high and you may need additional treatment if this continues.  - If your symptoms return, seek medical attention right away.     Allergies as of 07/29/2017      Reactions   Oxybutynin Chloride Nausea And Vomiting   Codeine Nausea And Vomiting, Rash      Medication List    TAKE these medications   12 HOUR NASAL SPRAY 0.05 % nasal spray Generic drug:  oxymetazoline Place 2 sprays into the nose 2 (two) times daily as needed for congestion.   albuterol 108 (90 Base) MCG/ACT inhaler Commonly known as:  PROVENTIL HFA;VENTOLIN HFA Inhale 2 puffs into the lungs every 4 (four) hours as needed for wheezing or shortness of breath.   AMBULATORY NON FORMULARY MEDICATION Trach([place] 6.0 XLT UP uncuffed), Trach Care Kit, Trach holders, Disposable inner cannulas   betamethasone dipropionate 0.05 % lotion Apply topically 2 (two) times daily. To rash on leg   feeding supplement (JEVITY 1.2 CAL) Liqd Place 474 mLs into feeding tube every 6 (six) hours.   IBU 400 MG tablet Generic drug:  ibuprofen take 1 tablet by mouth three times a day if needed for pain What changed:  See the new instructions.   ipratropium-albuterol 0.5-2.5 (3) MG/3ML Soln Commonly known as:  DUONEB Take 3 mLs by nebulization every 6 (six) hours as needed. What changed:  reasons to take this   levofloxacin 25 MG/ML solution Commonly known as:  LEVAQUIN Place 30 mLs (750 mg total) into feeding tube daily for 4 days.   losartan 100 MG  tablet Commonly known as:  COZAAR Place 1 tablet (100 mg total) into feeding tube daily.   naphazoline-pheniramine 0.025-0.3 % ophthalmic solution Commonly known as:  NAPHCON-A Place 2 drops into both eyes 4 (four) times daily as needed for eye irritation or allergies.   predniSONE 20 MG tablet Commonly known as:  DELTASONE Place 2 tablets (40 mg total) into feeding tube daily with breakfast. Start taking on:  07/30/2017   ranitidine 150 MG tablet Commonly known as:  ZANTAC Take 150 mg per tube two times a day   triamcinolone cream 0.1 % Commonly known as:  KENALOG Apply 1 application topically 2 (two) times daily. APPLY TO AFFECTED AREAS      Follow-up Information    Rogers Blocker, MD. Schedule an appointment as soon as possible for a visit in 1 week(s).   Specialty:  Internal Medicine Contact information: [place] [address] [phone]          Allergies  Allergen Reactions  . Oxybutynin Chloride Nausea And Vomiting  . Codeine Nausea And Vomiting and Rash    Consultations:  None  Procedures/Studies: Dg Chest Port 1 View  Result Date: 07/26/2017 CLINICAL DATA:  Shortness of breath with wheezing. EXAM: PORTABLE CHEST 1 VIEW COMPARISON:  06/30/2017 and 03/04/2017 FINDINGS: Lungs are adequately inflated demonstrate bibasilar mixed interstitial airspace  density which may be due to early infection versus atelectasis. No effusion. Cardiomediastinal silhouette and remainder of the exam is unchanged. IMPRESSION: Mild bibasilar opacification which may be due to infection versus atelectasis. Recommend follow-up to resolution. Electronically Signed   By: Marin Olp M.D.   On: 07/26/2017 19:31   Dg Chest Port 1 View  Result Date: 06/30/2017 CLINICAL DATA:  Shortness of Breath EXAM: PORTABLE CHEST 1 VIEW COMPARISON:  03/04/2017 FINDINGS: Bibasilar atelectasis. Heart is normal size. No effusions or acute bony abnormality. IMPRESSION: Bibasilar  atelectasis. Electronically Signed   By: Rolm Baptise M.D.   On: 06/30/2017 18:39    Echocardiogram 07/27/2017: - Left ventricle: The cavity size was normal. Wall thickness was normal. Systolic function was normal. The estimated ejection fraction was in the range of 60% to 65%. Wall motion was normal; there were no regional wall motion abnormalities. Doppler parameters are consistent with abnormal left ventricular relaxation (G1DD)  Subjective: Breathing back at baseline, ready to go home. No wheezing and secretions have decreased.  Discharge Exam: Vitals:   07/29/17 0919 07/29/17 1218  BP:  (!) 155/93  Pulse: 74 80  Resp: 20 (!) 22  Temp:  98.6 F (37 C)  SpO2: 100% 100%   General: Pleasant male in no distress HEENT: Extensive edema involving mandible extending up the face including periorbitally. Lip edema stable no bleeding from mucous membranes noted. Neck: Trach in normal position with minimal secretions.  Cardiovascular: RRR, S1/S2 +, no rubs, no gallops Respiratory: Clear, mildly diminished without crackles or wheezes.   Labs: BNP (last 3 results) Recent Labs    07/26/17 2107  BNP 57.8   Basic Metabolic Panel: Recent Labs  Lab 07/26/17 1915 07/26/17 2335 07/27/17 1704 07/28/17 0356 07/29/17 0413  NA 129* 132* 133* 130* 132*  K 5.4* 5.3* 4.2 4.2 4.3  CL 93* 97* 96* 95* 98*  CO2 '25 28 29 27 28  '$ GLUCOSE 175* 120* 138* 241* 158*  BUN '16 15 15 16 18  '$ CREATININE 0.86 0.93 0.81 0.78 0.81  CALCIUM 9.3 8.6* 9.1 8.8* 8.9   Liver Function Tests: No results for input(s): AST, ALT, ALKPHOS, BILITOT, PROT, ALBUMIN in the last 168 hours. No results for input(s): LIPASE, AMYLASE in the last 168 hours. No results for input(s): AMMONIA in the last 168 hours. CBC: Recent Labs  Lab 07/26/17 1915  WBC 7.9  NEUTROABS 5.8  HGB 13.9  HCT 43.5  MCV 88.1  PLT 352   Cardiac Enzymes: Recent Labs  Lab 07/26/17 2121 07/27/17 0336 07/27/17 0901  TROPONINI  0.08* 0.07* 0.05*   BNP: Invalid input(s): POCBNP CBG: Recent Labs  Lab 07/27/17 0833 07/27/17 1647 07/28/17 0647 07/28/17 0754 07/29/17 0749  GLUCAP 167* 147* 135* 114* 117*   D-Dimer No results for input(s): DDIMER in the last 72 hours. Hgb A1c Recent Labs    07/27/17 0336  HGBA1C 6.7*   Lipid Profile Recent Labs    07/27/17 0336  CHOL 125  HDL 52  LDLCALC 68  TRIG 25  CHOLHDL 2.4   Thyroid function studies Recent Labs    07/27/17 0336  TSH 1.188   Anemia work up No results for input(s): VITAMINB12, FOLATE, FERRITIN, TIBC, IRON, RETICCTPCT in the last 72 hours. Urinalysis    Component Value Date/Time   COLORURINE YELLOW 09/14/2015 0910   APPEARANCEUR CLEAR 09/14/2015 0910   LABSPEC 1.013 09/14/2015 0910   PHURINE 7.5 09/14/2015 0910   GLUCOSEU NEGATIVE 09/14/2015 0910   GLUCOSEU NEGATIVE 10/29/2013 1118  HGBUR NEGATIVE 09/14/2015 0910   BILIRUBINUR NEGATIVE 09/14/2015 0910   BILIRUBINUR Negative 10/08/2014 0930   KETONESUR NEGATIVE 09/14/2015 0910   PROTEINUR NEGATIVE 09/14/2015 0910   UROBILINOGEN 0.2 10/08/2014 0930   UROBILINOGEN 0.2 10/29/2013 1118   NITRITE NEGATIVE 09/14/2015 0910   LEUKOCYTESUR NEGATIVE 09/14/2015 0910    Microbiology Recent Results (from the past 240 hour(s))  Blood culture (routine x 2)     Status: None (Preliminary result)   Collection Time: 07/26/17  8:18 PM  Result Value Ref Range Status   Specimen Description BLOOD BLOOD LEFT FOREARM  Final   Special Requests   Final    BOTTLES DRAWN AEROBIC AND ANAEROBIC Blood Culture results may not be optimal due to an inadequate volume of blood received in culture bottles   Culture   Final    NO GROWTH 3 DAYS Performed at [facility], [place] [address] [place], [place] [address]    Report Status PENDING  Incomplete  Blood culture (routine x 2)     Status: None (Preliminary result)   Collection Time: 07/26/17  8:25 PM  Result Value Ref Range Status   Specimen  Description BLOOD LEFT ANTECUBITAL  Final   Special Requests   Final    BOTTLES DRAWN AEROBIC AND ANAEROBIC Blood Culture results may not be optimal due to an excessive volume of blood received in culture bottles   Culture  Setup Time   Final    GRAM POSITIVE RODS ANAEROBIC BOTTLE ONLY CRITICAL RESULT CALLED TO, READ BACK BY AND VERIFIED WITH: E MARTIN,PHARMD AT 1401 07/28/17 BY L BENFIELD Performed at [facility], [place] [address], [place], [place] [address]    Culture GRAM POSITIVE RODS  Final   Report Status PENDING  Incomplete  Blood Culture ID Panel (Reflexed)     Status: None   Collection Time: 07/26/17  8:25 PM  Result Value Ref Range Status   Enterococcus species NOT DETECTED NOT DETECTED Final   Listeria monocytogenes NOT DETECTED NOT DETECTED Final   Staphylococcus species NOT DETECTED NOT DETECTED Final   Staphylococcus aureus NOT DETECTED NOT DETECTED Final   Streptococcus species NOT DETECTED NOT DETECTED Final   Streptococcus agalactiae NOT DETECTED NOT DETECTED Final   Streptococcus pneumoniae NOT DETECTED NOT DETECTED Final   Streptococcus pyogenes NOT DETECTED NOT DETECTED Final   Acinetobacter baumannii NOT DETECTED NOT DETECTED Final   Enterobacteriaceae species NOT DETECTED NOT DETECTED Final   Enterobacter cloacae complex NOT DETECTED NOT DETECTED Final   Escherichia coli NOT DETECTED NOT DETECTED Final   Klebsiella oxytoca NOT DETECTED NOT DETECTED Final   Klebsiella pneumoniae NOT DETECTED NOT DETECTED Final   Proteus species NOT DETECTED NOT DETECTED Final   Serratia marcescens NOT DETECTED NOT DETECTED Final   Haemophilus influenzae NOT DETECTED NOT DETECTED Final   Neisseria meningitidis NOT DETECTED NOT DETECTED Final   Pseudomonas aeruginosa NOT DETECTED NOT DETECTED Final   Candida albicans NOT DETECTED NOT DETECTED Final   Candida glabrata NOT DETECTED NOT DETECTED Final   Candida krusei NOT DETECTED NOT DETECTED Final   Candida parapsilosis NOT  DETECTED NOT DETECTED Final   Candida tropicalis NOT DETECTED NOT DETECTED Final    Comment: Performed at [facility], [place]. [address], [place], [place] [address]  Respiratory Panel by PCR     Status: None   Collection Time: 07/26/17  8:55 PM  Result Value Ref Range Status   Adenovirus NOT DETECTED NOT DETECTED Final  Coronavirus 229E NOT DETECTED NOT DETECTED Final   Coronavirus HKU1 NOT DETECTED NOT DETECTED Final   Coronavirus NL63 NOT DETECTED NOT DETECTED Final   Coronavirus OC43 NOT DETECTED NOT DETECTED Final   Metapneumovirus NOT DETECTED NOT DETECTED Final   Rhinovirus / Enterovirus NOT DETECTED NOT DETECTED Final   Influenza A NOT DETECTED NOT DETECTED Final   Influenza B NOT DETECTED NOT DETECTED Final   Parainfluenza Virus 1 NOT DETECTED NOT DETECTED Final   Parainfluenza Virus 2 NOT DETECTED NOT DETECTED Final   Parainfluenza Virus 3 NOT DETECTED NOT DETECTED Final   Parainfluenza Virus 4 NOT DETECTED NOT DETECTED Final   Respiratory Syncytial Virus NOT DETECTED NOT DETECTED Final   Bordetella pertussis NOT DETECTED NOT DETECTED Final   Chlamydophila pneumoniae NOT DETECTED NOT DETECTED Final   Mycoplasma pneumoniae NOT DETECTED NOT DETECTED Final    Comment: Performed at [facility], [place] [address] [place], [place] [address]  Culture, respiratory (NON-Expectorated)     Status: None   Collection Time: 07/26/17 10:39 PM  Result Value Ref Range Status   Specimen Description TRACHEAL ASPIRATE  Final   Special Requests NONE  Final   Gram Stain   Final    FEW WBC PRESENT, PREDOMINANTLY PMN RARE SQUAMOUS EPITHELIAL CELLS PRESENT MODERATE GRAM POSITIVE COCCI MODERATE GRAM POSITIVE RODS RARE GRAM NEGATIVE RODS    Culture   Final    Consistent with normal respiratory flora. Performed at [facility], [place] [address], [place], [place] [address]    Report Status 07/29/2017 FINAL  Final  MRSA PCR Screening     Status: None   Collection Time: 07/27/17   2:46 PM  Result Value Ref Range Status   MRSA by PCR NEGATIVE NEGATIVE Final    Comment:        The GeneXpert MRSA Assay (FDA approved for NASAL specimens only), is one component of a comprehensive MRSA colonization surveillance program. It is not intended to diagnose MRSA infection nor to guide or monitor treatment for MRSA infections. Performed at [facility], [place] [address] [place], [place] [address]     Time coordinating discharge: Approximately 40 minutes  Patrecia Pour, MD  [facility] 07/29/2017, 12:24 PM Pager [phone]

## 2017-07-29 NOTE — Progress Notes (Signed)
Patient discharged home. Patient's daughter made aware. Patient left via [employer]. Patient's belongings sent with patient.

## 2017-07-30 DIAGNOSIS — J4531 Mild persistent asthma with (acute) exacerbation: Secondary | ICD-10-CM | POA: Diagnosis not present

## 2017-07-31 LAB — CULTURE, BLOOD (ROUTINE X 2): Culture: NO GROWTH

## 2017-08-08 ENCOUNTER — Inpatient Hospital Stay (HOSPITAL_COMMUNITY): Admission: RE | Admit: 2017-08-08 | Payer: Medicare Other | Source: Ambulatory Visit

## 2017-08-24 ENCOUNTER — Emergency Department (HOSPITAL_COMMUNITY): Payer: Medicare Other

## 2017-08-24 ENCOUNTER — Emergency Department (HOSPITAL_COMMUNITY)
Admission: EM | Admit: 2017-08-24 | Discharge: 2017-08-24 | Disposition: A | Discharge status: Home / Self Care | Payer: Medicare Other | Attending: Emergency Medicine | Admitting: Emergency Medicine

## 2017-08-24 ENCOUNTER — Encounter (HOSPITAL_COMMUNITY): Payer: Self-pay | Admitting: Emergency Medicine

## 2017-08-24 ENCOUNTER — Other Ambulatory Visit: Payer: Self-pay

## 2017-08-24 DIAGNOSIS — Z7982 Long term (current) use of aspirin: Secondary | ICD-10-CM | POA: Insufficient documentation

## 2017-08-24 DIAGNOSIS — R0789 Other chest pain: Secondary | ICD-10-CM | POA: Insufficient documentation

## 2017-08-24 DIAGNOSIS — Z85850 Personal history of malignant neoplasm of thyroid: Secondary | ICD-10-CM | POA: Insufficient documentation

## 2017-08-24 DIAGNOSIS — Z7401 Bed confinement status: Secondary | ICD-10-CM | POA: Diagnosis not present

## 2017-08-24 DIAGNOSIS — E039 Hypothyroidism, unspecified: Secondary | ICD-10-CM | POA: Diagnosis not present

## 2017-08-24 DIAGNOSIS — R000 Tachycardia, unspecified: Secondary | ICD-10-CM | POA: Diagnosis not present

## 2017-08-24 DIAGNOSIS — Z79899 Other long term (current) drug therapy: Secondary | ICD-10-CM | POA: Diagnosis not present

## 2017-08-24 DIAGNOSIS — Z96641 Presence of right artificial hip joint: Secondary | ICD-10-CM | POA: Insufficient documentation

## 2017-08-24 DIAGNOSIS — I10 Essential (primary) hypertension: Secondary | ICD-10-CM | POA: Insufficient documentation

## 2017-08-24 DIAGNOSIS — R0602 Shortness of breath: Secondary | ICD-10-CM

## 2017-08-24 DIAGNOSIS — R0689 Other abnormalities of breathing: Secondary | ICD-10-CM | POA: Diagnosis not present

## 2017-08-24 DIAGNOSIS — M2550 Pain in unspecified joint: Secondary | ICD-10-CM | POA: Diagnosis not present

## 2017-08-24 MED ORDER — ALBUTEROL SULFATE (2.5 MG/3ML) 0.083% IN NEBU
INHALATION_SOLUTION | RESPIRATORY_TRACT | Status: AC
  Filled 2017-08-24: qty 6

## 2017-08-24 MED ORDER — ALBUTEROL SULFATE (2.5 MG/3ML) 0.083% IN NEBU
5.0000 mg | INHALATION_SOLUTION | Freq: Once | RESPIRATORY_TRACT | Status: AC
Start: 2017-08-24 — End: 2017-08-24
  Administered 2017-08-24: 5 mg via RESPIRATORY_TRACT

## 2017-08-24 NOTE — ED Triage Notes (Signed)
Pt BIB [facility], trach pt, c/o shortness of breath, initial SpO2 78% for EMS, suctioned x 3, SpO2 increased to 98% on NRB. Alert to baseline.

## 2017-08-24 NOTE — ED Provider Notes (Signed)
[facility] EMERGENCY DEPARTMENT Provider Note   CSN: [id number] Arrival date & time: 08/24/17  1652     History   Chief Complaint Chief Complaint  Patient presents with  . Shortness of Breath    HPI Nicholas Cobb is a 67 y.o. male.  HPI  Patient presents from nursing facility with concern of dyspnea, increased work of breathing, and possible trach malfunction. Patient has multiple medical issues, is nonverbal, communicating with paper inconsistently, level 5 caveat secondary to his inability to speak and acuity of condition. Patient has multiple medical issues, including throat cancer, now status post surgical repair, chemotherapy, with known facial and laryngal disfigurement, chronic trach status. Per EMS the patient was hypoxic, with increased work of breathing at the facility, sent here for evaluation. Patient improved with suctioning, but has increased work of breathing on arrival, is unable to provide details beyond nodding, writing some words on paper.   Past Medical History:  Diagnosis Date  . Allergy   . Cancer ([place]) 1999   throat cancer  . Cervical spondylolysis 04/30/2011   severe  . Cervical spondylosis 03/25/2011  . Change in voice   . Chronic pain 03/25/2011  . Chronic sinusitis 04/27/2011  . Depression   . Difficulty urinating   . Emphysema 03/25/2011  . Essential hypertension, benign 12/03/2012  . GERD (gastroesophageal reflux disease) 03/25/2011  . Hearing loss   . Impaired glucose tolerance 04/27/2011  . 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 Rash   . Trouble swallowing   . Type II or unspecified type diabetes mellitus without mention of complication, uncontrolled 04/27/2011  . Ulcer   . UTI (lower urinary tract infection)   . Xerostomia 03/25/2011    Patient Active Problem List   Diagnosis Date Noted  . Elevated troponin 07/27/2017  . Hyperkalemia 07/27/2017  . Hypertensive urgency 07/26/2017  . Hypoxia   . Tracheostomy  malfunction ([place]) 03/01/2017  . Trachea displaced 12/29/2016  . Other tracheostomy complication ([place]) 93/71/6967  . Radionecrosis   . Tracheostomy care ([place])   . Hypoxemia   . Hemoptysis 01/26/2016  . Throat cancer ([place])   . Controlled diabetes mellitus type 2 with complications ([place])   . Acute on chronic respiratory failure with hypoxia ([place]) 06/14/2015  . Respiratory failure ([place]) 06/13/2015  . Tracheostomy dependence ([place])   . Cellulitis and abscess of head   . Cellulitis of face   . Tracheostomy status ([place])   . Acute sinus infection 10/29/2013  . Rash 10/29/2013  . Hyponatremia 12/03/2012  . Essential hypertension 12/03/2012  . Cervical spondylolysis 04/30/2011  . Chronic sinusitis 04/27/2011  . Diabetes mellitus without complication ([place]) 89/38/1017  . Erectile dysfunction 04/19/2011  . Preventative health care 03/25/2011  . Cervical spondylosis 03/25/2011  . Pulmonary emphysema ([place]) 03/25/2011  . GERD (gastroesophageal reflux disease) 03/25/2011  . Chronic pain 03/25/2011  . 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 Xerostomia 03/25/2011  . Tracheostomy in place [facility]) 02/04/2011  . Laryngeal cancer ([place]) 02/04/2011  . Encounter for PEG (percutaneous endoscopic gastrostomy) ([place]) 02/04/2011  . Attention to G-tube [facility]) 11/07/2010    Past Surgical History:  Procedure Laterality Date  . GASTROSTOMY TUBE PLACEMENT    . IR GENERIC HISTORICAL  04/03/2016   IR CM INJ ANY COLONIC TUBE W/FLUORO 04/03/2016 [name], [name]  . PEG TUBE PLACEMENT  11/1999  . TRACHEOSTOMY  11/1999  . TRACHEOSTOMY TUBE PLACEMENT  11/24/2016   Procedure: EMERGENT TRACHEOSTOMY CHANGE;  Surgeon: Jerrell Belfast, MD;  Location: [facility] OR;  Service: ENT;;  . TRACHEOSTOMY TUBE PLACEMENT N/A 12/29/2016   Procedure: TRACHEOSTOMY;  Surgeon: Melissa Montane, MD;  Location: [facility];  Service: ENT;  Laterality: N/A;        Home Medications    Prior to Admission medications   Medication Sig Start  Date End Date Taking? Authorizing Provider  albuterol (PROVENTIL HFA;VENTOLIN HFA) 108 (90 Base) MCG/ACT inhaler Inhale 2 puffs into the lungs every 4 (four) hours as needed for wheezing or shortness of breath. 03/09/16   [name], NP  AMBULATORY NON FORMULARY MEDICATION Trach(Shiley 6.0 XLT UP uncuffed), Trach Care Kit, Trach holders, Disposable inner cannulas 07/08/15   Gildardo Cranker, DO  betamethasone dipropionate 0.05 % lotion Apply topically 2 (two) times daily. To rash on leg Patient not taking: Reported on 03/05/2017 10/20/15   Gildardo Cranker, DO  IBU 400 MG tablet take 1 tablet by mouth three times a day if needed for pain Patient taking differently: Take 400 mg by mouth three times a day as needed for pain 01/17/17   [name], NP  ipratropium-albuterol (DUONEB) 0.5-2.5 (3) MG/3ML SOLN Take 3 mLs by nebulization every 6 (six) hours as needed. Patient taking differently: Take 3 mLs by nebulization every 6 (six) hours as needed (for wheezing or shortness of breath).  08/01/16   Robyn Haber, MD  losartan (COZAAR) 100 MG tablet Place 1 tablet (100 mg total) into feeding tube daily. 11/27/16   Cherene Altes, MD  naphazoline-pheniramine (NAPHCON-A) 0.025-0.3 % ophthalmic solution Place 2 drops into both eyes 4 (four) times daily as needed for eye irritation or allergies.     [provider]  Nutritional Supplements (FEEDING SUPPLEMENT, JEVITY 1.2 CAL,) LIQD Place 474 mLs into feeding tube every 6 (six) hours. 11/27/16   Cherene Altes, MD  oxymetazoline (12 HOUR NASAL SPRAY) 0.05 % nasal spray Place 2 sprays into the nose 2 (two) times daily as needed for congestion.     [provider]  predniSONE (DELTASONE) 20 MG tablet Place 2 tablets (40 mg total) into feeding tube daily with breakfast. 07/30/17   Patrecia Pour, MD  ranitidine (ZANTAC) 150 MG tablet Take 150 mg per tube two times a day 11/27/16   Cherene Altes, MD  triamcinolone cream  (KENALOG) 0.1 % Apply 1 application topically 2 (two) times daily. APPLY TO AFFECTED AREAS 11/27/16   Cherene Altes, MD    Family History Family History  Problem Relation Age of Onset  . Diabetes Mother   . Heart disease Father   . Stroke Other   . Diabetes Other   . Cancer Other        lung cancer    Social History Social History   Tobacco Use  . Smoking status: Former Smoker    Packs/day: 1.00    Years: 23.00    Pack years: 23.00    Last attempt to quit: 11/06/1996    Years since quitting: 20.8  . Smokeless tobacco: Never Used  Substance Use Topics  . Alcohol use: No    Alcohol/week: 0.0 oz  . Drug use: No     Allergies   Oxybutynin chloride and Codeine   Review of Systems Review of Systems  Unable to perform ROS: Patient nonverbal     Physical Exam Updated Vital Signs BP (!) 188/118   Pulse (!) 106   Temp 98.2 F (36.8 C) (Temporal)   Resp (!) 26   SpO2 100%  Physical Exam  Constitutional: No distress.  Chronically ill-appearing male laying in bed  HENT:  Significant facial edema that is chronic in appearance, and involve cheeks, and lips and eyelids  Eyes: Conjunctivae are normal.  Neck: Neck supple.  Tracheostomy tube hard external catheter in place, internal device not fixed  Cardiovascular: Normal rate and regular rhythm.  Pulmonary/Chest: Accessory muscle usage present. Tachypnea noted. He has decreased breath sounds. He has wheezes.  Faint wheezes heard.  Abdominal: Soft.  Neurological: He is alert. He displays atrophy. A cranial nerve deficit is present. He exhibits abnormal muscle tone. He displays no seizure activity.  Skin: Skin is warm and dry.  Psychiatric: His mood appears anxious. Cognition and memory are impaired.  Nursing note and vitals reviewed.    ED Treatments / Results   EKG EKG Interpretation  Date/Time:  Friday August 24 2017 17:01:59 EDT Ventricular Rate:  108 PR Interval:    QRS Duration: 76 QT  Interval:  321 QTC Calculation: 431 R Axis:   92 Text Interpretation:  Sinus tachycardia Right atrial enlargement Right axis deviation Baseline wander in lead(s) II III aVF Abnormal ekg Confirmed by Carmin Muskrat [phone]) on 08/24/2017 5:37:54 PM   Radiology Dg Chest Portable 1 View  Result Date: 08/24/2017 CLINICAL DATA:  67 y/o M; tracheostomy patient. Shortness of breath. EXAM: PORTABLE CHEST 1 VIEW COMPARISON:  07/26/2017 chest radiograph. FINDINGS: Stable normal cardiac silhouette given projection and technique. Aortic atherosclerosis with calcification. Mild reticular opacities at the lung bases. No consolidation. No pleural effusion or pneumothorax. No acute osseous abnormality is evident. Stable postsurgical changes at the right lower neck. Tracheostomy tube in situ. IMPRESSION: Mild basilar reticular opacities may represent vascular congestion or bronchitic changes. No consolidation. Electronically Signed   By: Kristine Garbe M.D.   On: 08/24/2017 17:09    Procedures Procedures (including critical care time)  Medications Ordered in ED Medications  albuterol (PROVENTIL) (2.5 MG/3ML) 0.083% nebulizer solution (  Not Given 08/24/17 1832)  albuterol (PROVENTIL) (2.5 MG/3ML) 0.083% nebulizer solution 5 mg (5 mg Nebulization Given 08/24/17 1752)   Chart review conducted after initial evaluation notable for 9 prior ED visits in 6 months, including one 3 weeks ago for similar circumstances. Given increased work of breathing, hypoxia, wheezing, the patient received breathing treatments after the initial evaluation.   Initial Impression / Assessment and Plan / ED Course  I have reviewed the triage vital signs and the nursing notes.  Pertinent labs & imaging results that were available during my care of the patient were reviewed by me and considered in my medical decision making (see chart for details).     6:57 PM Patient has had successful fixation of his tracheostomy tube,  has received breathing treatments, with improvement in his condition. Without notable findings on x-ray, without fever, and with improved oxygenation, without other complaints, the patient is appropriate for discharge to his nursing facility, to which he is amenable.  Final Clinical Impressions(s) / ED Diagnoses   Final diagnoses:  SOB (shortness of breath)     Carmin Muskrat, MD 08/24/17 1857

## 2017-08-24 NOTE — Discharge Instructions (Signed)
As discussed, your evaluation today has been largely reassuring.  But, it is important that you monitor your condition carefully, and do not hesitate to return to the ED if you develop new, or concerning changes in your condition. ? ?Otherwise, please follow-up with your physician for appropriate ongoing care. ? ?

## 2017-08-27 ENCOUNTER — Emergency Department (HOSPITAL_COMMUNITY): Payer: Medicare Other

## 2017-08-27 ENCOUNTER — Emergency Department (HOSPITAL_COMMUNITY)
Admission: EM | Admit: 2017-08-27 | Discharge: 2017-08-27 | Disposition: A | Discharge status: Home / Self Care | Payer: Medicare Other | Attending: Emergency Medicine | Admitting: Emergency Medicine

## 2017-08-27 ENCOUNTER — Encounter (HOSPITAL_COMMUNITY): Payer: Self-pay

## 2017-08-27 DIAGNOSIS — Y9389 Activity, other specified: Secondary | ICD-10-CM | POA: Diagnosis not present

## 2017-08-27 DIAGNOSIS — Y999 Unspecified external cause status: Secondary | ICD-10-CM | POA: Insufficient documentation

## 2017-08-27 DIAGNOSIS — X58XXXA Exposure to other specified factors, initial encounter: Secondary | ICD-10-CM | POA: Diagnosis not present

## 2017-08-27 DIAGNOSIS — R062 Wheezing: Secondary | ICD-10-CM | POA: Diagnosis not present

## 2017-08-27 DIAGNOSIS — E119 Type 2 diabetes mellitus without complications: Secondary | ICD-10-CM | POA: Diagnosis not present

## 2017-08-27 DIAGNOSIS — Z930 Tracheostomy status: Secondary | ICD-10-CM | POA: Insufficient documentation

## 2017-08-27 DIAGNOSIS — Z87891 Personal history of nicotine dependence: Secondary | ICD-10-CM | POA: Diagnosis not present

## 2017-08-27 DIAGNOSIS — R000 Tachycardia, unspecified: Secondary | ICD-10-CM | POA: Diagnosis not present

## 2017-08-27 DIAGNOSIS — Z79899 Other long term (current) drug therapy: Secondary | ICD-10-CM | POA: Insufficient documentation

## 2017-08-27 DIAGNOSIS — T17500A Unspecified foreign body in bronchus causing asphyxiation, initial encounter: Secondary | ICD-10-CM

## 2017-08-27 DIAGNOSIS — J9809 Other diseases of bronchus, not elsewhere classified: Secondary | ICD-10-CM

## 2017-08-27 DIAGNOSIS — T17998A Other foreign object in respiratory tract, part unspecified causing other injury, initial encounter: Secondary | ICD-10-CM | POA: Diagnosis not present

## 2017-08-27 DIAGNOSIS — J9601 Acute respiratory failure with hypoxia: Secondary | ICD-10-CM | POA: Diagnosis not present

## 2017-08-27 DIAGNOSIS — Y9289 Other specified places as the place of occurrence of the external cause: Secondary | ICD-10-CM | POA: Insufficient documentation

## 2017-08-27 DIAGNOSIS — R0602 Shortness of breath: Secondary | ICD-10-CM | POA: Diagnosis not present

## 2017-08-27 DIAGNOSIS — C329 Malignant neoplasm of larynx, unspecified: Secondary | ICD-10-CM | POA: Diagnosis not present

## 2017-08-27 DIAGNOSIS — I10 Essential (primary) hypertension: Secondary | ICD-10-CM | POA: Diagnosis not present

## 2017-08-27 DIAGNOSIS — R0600 Dyspnea, unspecified: Secondary | ICD-10-CM | POA: Diagnosis present

## 2017-08-27 DIAGNOSIS — J9811 Atelectasis: Secondary | ICD-10-CM | POA: Diagnosis not present

## 2017-08-27 DIAGNOSIS — R0689 Other abnormalities of breathing: Secondary | ICD-10-CM | POA: Diagnosis not present

## 2017-08-27 DIAGNOSIS — C153 Malignant neoplasm of upper third of esophagus: Secondary | ICD-10-CM | POA: Diagnosis not present

## 2017-08-27 LAB — CBC WITH DIFFERENTIAL/PLATELET
Abs Immature Granulocytes: 0 10*3/uL (ref 0.0–0.1)
Basophils Absolute: 0 10*3/uL (ref 0.0–0.1)
Basophils Relative: 0 %
EOS ABS: 0 10*3/uL (ref 0.0–0.7)
EOS PCT: 1 %
HEMATOCRIT: 41.1 % (ref 39.0–52.0)
Hemoglobin: 13 g/dL (ref 13.0–17.0)
IMMATURE GRANULOCYTES: 0 %
Lymphocytes Relative: 11 %
Lymphs Abs: 0.5 10*3/uL — ABNORMAL LOW (ref 0.7–4.0)
MCH: 28.6 pg (ref 26.0–34.0)
MCHC: 31.6 g/dL (ref 30.0–36.0)
MCV: 90.5 fL (ref 78.0–100.0)
MONO ABS: 1 10*3/uL (ref 0.1–1.0)
Monocytes Relative: 21 %
NEUTROS ABS: 3.2 10*3/uL (ref 1.7–7.7)
Neutrophils Relative %: 67 %
Platelets: 270 10*3/uL (ref 150–400)
RBC: 4.54 MIL/uL (ref 4.22–5.81)
RDW: 13.8 % (ref 11.5–15.5)
WBC: 4.7 10*3/uL (ref 4.0–10.5)

## 2017-08-27 LAB — BASIC METABOLIC PANEL
ANION GAP: 8 (ref 5–15)
BUN: 16 mg/dL (ref 8–23)
CALCIUM: 9 mg/dL (ref 8.9–10.3)
CO2: 29 mmol/L (ref 22–32)
CREATININE: 0.84 mg/dL (ref 0.61–1.24)
Chloride: 93 mmol/L — ABNORMAL LOW (ref 98–111)
GFR calc Af Amer: >60 mL/min (ref >60)
GLUCOSE: 125 mg/dL — AB (ref 70–99)
Potassium: 4.2 mmol/L (ref 3.5–5.1)
Sodium: 130 mmol/L — ABNORMAL LOW (ref 135–145)

## 2017-08-27 MED ORDER — ALBUTEROL SULFATE (2.5 MG/3ML) 0.083% IN NEBU
INHALATION_SOLUTION | RESPIRATORY_TRACT | Status: AC
  Administered 2017-08-27: 2.5 mg
  Filled 2017-08-27: qty 3

## 2017-08-27 NOTE — ED Notes (Signed)
Calls made to numbers found in chart. Unable to reach family.

## 2017-08-27 NOTE — ED Notes (Signed)
Pt alert and oriented in NAD. Pt requested cab home. Pt acknowledged discharge instructions via his note pad.

## 2017-08-27 NOTE — ED Triage Notes (Signed)
EMS reported pt in Resp distress fpor 2 hrs. Swelling d/t radiation overdose. HX of asthma Hr 101 180/112 98% on neb (5mg  albuterol)

## 2017-08-27 NOTE — Discharge Instructions (Addendum)
You are seen in the ER for breathing difficulty. After being suctioned, your breathing has improved. Please call the trach clinic for a prompt follow-up. Return to the ER if you start having shortness of breath again.

## 2017-08-27 NOTE — Consult Note (Addendum)
 Name: Nicholas Cobb MRN: [id number] DOB: 06/08/1950    ADMISSION DATE:  08/27/2017 CONSULTATION DATE:  08/27/2017  REFERRING MD :  Dr. 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  Of note, last trach clinic vist 4/3 with a no show for his last scheduled visit 6/26.  Per documents, he is from home.  Known history of difficulty with changing trach which usually has to be exchanged over bronchoscopy.- shiley 6 XLT proximal trach.  He presents for acute respiratory distress. Communicates by writing.  Patient was hypoxic with labored breathing and decreased air movement.  PCCM consulted for assistance.  On arrival, patient had resolution of dyspnea.  Required lavage and BV ventilation per RT with result in suctioning a mucous plug.  Since, patient has better air movement, resolution of hypoxia and writes that he feels much better.  CXR and labs pending.  Denies recent fever or issues with trach other than writing "it is old" and that he has trouble locking his inner cannula.    PAST MEDICAL HISTORY :   has a past medical history of Allergy, Cancer ([facility]) (1999), Cervical spondylolysis (04/30/2011), Cervical spondylosis (03/25/2011), Change in voice, Chronic pain (03/25/2011), Chronic sinusitis (04/27/2011), Depression, Difficulty urinating, Emphysema (03/25/2011), Essential hypertension, benign (12/03/2012), GERD (gastroesophageal reflux disease) (03/25/2011), Hearing loss, Impaired glucose tolerance (04/27/2011), Osteoradionecrosis of jaw (03/25/2011), Rash, Trouble swallowing, Type II or unspecified type diabetes mellitus without mention of complication, uncontrolled (04/27/2011), Ulcer, UTI (lower  urinary tract infection), and Xerostomia (03/25/2011).  has a past surgical history that includes PEG tube placement (11/1999); Tracheostomy (11/1999); Gastrostomy tube placement; ir generic historical (04/03/2016); Tracheostomy tube placement (11/24/2016); and Tracheostomy tube placement (N/A, 12/29/2016). Prior to Admission medications   Medication Sig Start Date End Date Taking? Authorizing Provider  albuterol (PROVENTIL HFA;VENTOLIN HFA) 108 (90 Base) MCG/ACT inhaler Inhale 2 puffs into the lungs every 4 (four) hours as needed for wheezing or shortness of breath. 03/09/16   Eubanks, [name], NP  AMBULATORY NON FORMULARY MEDICATION Trach(Shiley 6.0 XLT UP uncuffed), Trach Care Kit, Trach holders, Disposable inner cannulas 07/08/15   Carter, Monica, DO  betamethasone dipropionate 0.05 % lotion Apply topically 2 (two) times daily. To rash on leg Patient not taking: Reported on 03/05/2017 10/20/15   Carter, Monica, DO  IBU 400 MG tablet take 1 tablet by mouth three times a day if needed for pain Patient taking differently: Take 400 mg by mouth three times a day as needed for pain 01/17/17   Eubanks, [name], NP  ipratropium-albuterol (DUONEB) 0.5-2.5 (3) MG/3ML SOLN Take 3 mLs by nebulization every 6 (six) hours as needed. Patient taking differently: Take 3 mLs by nebulization every 6 (six) hours as needed (for wheezing or shortness of breath).  08/01/16   Lauenstein, Kurt, MD  losartan (COZAAR) 100 MG tablet Place 1 tablet (100 mg total) into feeding tube daily. 11/27/16   McClung, Jeffrey T, MD  naphazoline-pheniramine (NAPHCON-A) 0.025-0.3 % ophthalmic solution Place 2 drops into both eyes 4 (four) times daily as needed for eye irritation or allergies.     [provider]  Nutritional Supplements (FEEDING SUPPLEMENT, JEVITY 1.2 CAL,) LIQD Place 474 mLs into feeding tube every 6 (  six) hours. 11/27/16   McClung, Jeffrey T, MD  oxymetazoline (12 HOUR NASAL SPRAY) 0.05 % nasal spray Place 2  sprays into the nose 2 (two) times daily as needed for congestion.     [provider]  predniSONE (DELTASONE) 20 MG tablet Place 2 tablets (40 mg total) into feeding tube daily with breakfast. 07/30/17   Grunz, Ryan B, MD  ranitidine (ZANTAC) 150 MG tablet Take 150 mg per tube two times a day 11/27/16   McClung, Jeffrey T, MD  triamcinolone cream (KENALOG) 0.1 % Apply 1 application topically 2 (two) times daily. APPLY TO AFFECTED AREAS 11/27/16   McClung, Jeffrey T, MD   Allergies  Allergen Reactions  . Oxybutynin Chloride Nausea And Vomiting  . Codeine Nausea And Vomiting and Rash    FAMILY HISTORY:  family history includes Cancer in his other; Diabetes in his mother and other; Heart disease in his father; Stroke in his other. SOCIAL HISTORY:  reports that he quit smoking about 20 years ago. He has a 23.00 pack-year smoking history. He has never used smokeless tobacco. He reports that he does not drink alcohol or use drugs.  REVIEW OF SYSTEMS:   ROS negative except for those stated in HPI.  SUBJECTIVE:   VITAL SIGNS: Temp:  [98.8 F (37.1 C)] 98.8 F (37.1 C) (07/15 1827) Pulse Rate:  [91-110] 91 (07/15 1828) Resp:  [23-26] 23 (07/15 1828) BP: (164-183)/(96-100) 164/100 (07/15 1800) SpO2:  [93 %-100 %] 97 % (07/15 1828) FiO2 (%):  [98 %] 98 % (07/15 1828) Weight:  [159 lb (72.1 kg)] 159 lb (72.1 kg) (07/15 1753)  PHYSICAL EXAMINATION: General:  Chronically ill appearing male in NAD, writing on pad HEENT: MM pink/moist, contracted neck, bilateral periorbital swelling, mandibular lymphoedema, midline trach- difficult to access- inner cannula with difficulty locking Neuro: Alert, communicative with writing  CV:  rrr, no m/r/g PULM: even/non-labored, exp wheeze, good airmovement Extremities: warm/dry, no edema   No results for input(s): NA, K, CL, CO2, BUN, CREATININE, GLUCOSE in the last 168 hours. No results for input(s): HGB, HCT, WBC, PLT in the last 168 hours. No  results found.  ASSESSMENT / PLAN:  Chronic respiratory failure- tracheostomy dependent - dyspnea and hypoxia resolved after aggressive suctioning/ lavage  - CXR with RLL atelectasis  - afebrile P:  CBC and BMET pending duonebs prn  Will be monitored in ER and likely discharged home if no further issues and remains stable Patient will need follow-up in trach clinic, I have emphasized the importance of follow-up;  will arrange this tomorrow am when trach clinic opens.  Brooke Sampsel, AGACNP-BC [facility] Pgr: [phone] or if no answer [phone] 08/27/2017, 7:02 PM   

## 2017-08-27 NOTE — ED Provider Notes (Addendum)
[facility] EMERGENCY DEPARTMENT Provider Note   CSN: [id number] Arrival date & time: 08/27/17  1741     History   Chief Complaint Chief Complaint  Patient presents with  . Respiratory Distress    HPI Nicholas Cobb is a 67 y.o. male.  HPI Level 5 caveat for respiratory distress. 67 year old male comes in with chief complaint of difficulty breathing.  He has history of laryngeal cancer, status post trach in place.    Appearing the patient has been having trouble breathing just prior to ED arrival.  EMS reports that patient was hypoxic when they arrived, along with diaphoresis and tachypnea.  Patient denies any chest pain, fevers, chills.  Past Medical History:  Diagnosis Date  . Allergy   . Cancer ([place]) 1999   throat cancer  . Cervical spondylolysis 04/30/2011   severe  . Cervical spondylosis 03/25/2011  . Change in voice   . Chronic pain 03/25/2011  . Chronic sinusitis 04/27/2011  . Depression   . Difficulty urinating   . Emphysema 03/25/2011  . Essential hypertension, benign 12/03/2012  . GERD (gastroesophageal reflux disease) 03/25/2011  . Hearing loss   . Impaired glucose tolerance 04/27/2011  . 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 Rash   . Trouble swallowing   . Type II or unspecified type diabetes mellitus without mention of complication, uncontrolled 04/27/2011  . Ulcer   . UTI (lower urinary tract infection)   . Xerostomia 03/25/2011    Patient Active Problem List   Diagnosis Date Noted  . Elevated troponin 07/27/2017  . Hyperkalemia 07/27/2017  . Hypertensive urgency 07/26/2017  . Hypoxia   . Tracheostomy malfunction ([place]) 03/01/2017  . Trachea displaced 12/29/2016  . Other tracheostomy complication ([place]) 40/98/1191  . Radionecrosis   . Tracheostomy care ([place])   . Hypoxemia   . Hemoptysis 01/26/2016  . Throat cancer ([place])   . Controlled diabetes mellitus type 2 with complications ([place])   . Acute on chronic respiratory failure with  hypoxia ([place]) 06/14/2015  . Respiratory failure ([place]) 06/13/2015  . Tracheostomy dependence ([place])   . Cellulitis and abscess of head   . Cellulitis of face   . Tracheostomy status ([place])   . Acute sinus infection 10/29/2013  . Rash 10/29/2013  . Hyponatremia 12/03/2012  . Essential hypertension 12/03/2012  . Cervical spondylolysis 04/30/2011  . Chronic sinusitis 04/27/2011  . Diabetes mellitus without complication ([place]) 47/82/9562  . Erectile dysfunction 04/19/2011  . Preventative health care 03/25/2011  . Cervical spondylosis 03/25/2011  . Pulmonary emphysema ([place]) 03/25/2011  . GERD (gastroesophageal reflux disease) 03/25/2011  . Chronic pain 03/25/2011  . 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 Xerostomia 03/25/2011  . Tracheostomy in place [facility]) 02/04/2011  . Laryngeal cancer ([place]) 02/04/2011  . Encounter for PEG (percutaneous endoscopic gastrostomy) ([place]) 02/04/2011  . Attention to G-tube [facility]) 11/07/2010    Past Surgical History:  Procedure Laterality Date  . GASTROSTOMY TUBE PLACEMENT    . IR GENERIC HISTORICAL  04/03/2016   IR CM INJ ANY COLONIC TUBE W/FLUORO 04/03/2016 [name], [name]  . PEG TUBE PLACEMENT  11/1999  . TRACHEOSTOMY  11/1999  . TRACHEOSTOMY TUBE PLACEMENT  11/24/2016   Procedure: EMERGENT TRACHEOSTOMY CHANGE;  Surgeon: Jerrell Belfast, MD;  Location: [facility];  Service: ENT;;  . TRACHEOSTOMY TUBE PLACEMENT N/A 12/29/2016   Procedure: TRACHEOSTOMY;  Surgeon: Melissa Montane, MD;  Location: [facility];  Service: ENT;  Laterality: N/A;        Home Medications  Prior to Admission medications   Medication Sig Start Date End Date Taking? Authorizing Provider  ipratropium-albuterol (DUONEB) 0.5-2.5 (3) MG/3ML SOLN Take 3 mLs by nebulization every 6 (six) hours as needed. Patient taking differently: Take 3 mLs by nebulization every 6 (six) hours as needed (for wheezing or shortness of breath).  08/01/16  Yes Robyn Haber, MD  losartan (COZAAR) 100  MG tablet Place 1 tablet (100 mg total) into feeding tube daily. 11/27/16  Yes Cherene Altes, MD  ranitidine (ZANTAC) 150 MG tablet Take 150 mg per tube two times a day 11/27/16  Yes Cherene Altes, MD  albuterol (PROVENTIL HFA;VENTOLIN HFA) 108 (90 Base) MCG/ACT inhaler Inhale 2 puffs into the lungs every 4 (four) hours as needed for wheezing or shortness of breath. 03/09/16   [name], NP  AMBULATORY NON FORMULARY MEDICATION Trach(Shiley 6.0 XLT UP uncuffed), [place], [place] holders, Disposable inner cannulas 07/08/15   Gildardo Cranker, DO  betamethasone dipropionate 0.05 % lotion Apply topically 2 (two) times daily. To rash on leg Patient not taking: Reported on 03/05/2017 10/20/15   Gildardo Cranker, DO  IBU 400 MG tablet take 1 tablet by mouth three times a day if needed for pain Patient taking differently: Take 400 mg by mouth three times a day as needed for pain 01/17/17   [name], NP  naphazoline-pheniramine (NAPHCON-A) 0.025-0.3 % ophthalmic solution Place 2 drops into both eyes 4 (four) times daily as needed for eye irritation or allergies.     [provider]  Nutritional Supplements (FEEDING SUPPLEMENT, JEVITY 1.2 CAL,) LIQD Place 474 mLs into feeding tube every 6 (six) hours. 11/27/16   Cherene Altes, MD  oxymetazoline (12 HOUR NASAL SPRAY) 0.05 % nasal spray Place 2 sprays into the nose 2 (two) times daily as needed for congestion.     [provider]  triamcinolone cream (KENALOG) 0.1 % Apply 1 application topically 2 (two) times daily. APPLY TO AFFECTED AREAS 11/27/16   Cherene Altes, MD    Family History Family History  Problem Relation Age of Onset  . Diabetes Mother   . Heart disease Father   . Stroke Other   . Diabetes Other   . Cancer Other        lung cancer    Social History Social History   Tobacco Use  . Smoking status: Former Smoker    Packs/day: 1.00    Years: 23.00    Pack years: 23.00    Last  attempt to quit: 11/06/1996    Years since quitting: 20.8  . Smokeless tobacco: Never Used  Substance Use Topics  . Alcohol use: No    Alcohol/week: 0.0 oz  . Drug use: No     Allergies   Oxybutynin chloride and Codeine   Review of Systems Review of Systems  Unable to perform ROS: Severe respiratory distress     Physical Exam Updated Vital Signs BP (!) 179/85   Pulse 77   Temp 98.8 F (37.1 C) (Axillary)   Resp 19   Wt 72.1 kg (159 lb)   SpO2 96%   BMI 22.81 kg/m   Physical Exam  Constitutional: He is oriented to person, place, and time. He appears well-developed.  HENT:  Head: Atraumatic.  Neck: Neck supple.  Cardiovascular:  Tachycardia  Pulmonary/Chest: No stridor. He is in respiratory distress. He has no wheezes. He has no rales.  Poor air movement  Neurological: He is alert and oriented to person,  place, and time.  Skin: Skin is warm.  Nursing note and vitals reviewed.    ED Treatments / Results  Labs (all labs ordered are listed, but only abnormal results are displayed) Labs Reviewed  BASIC METABOLIC PANEL - Abnormal; Notable for the following components:      Result Value   Sodium 130 (*)    Chloride 93 (*)    Glucose, Bld 125 (*)    All other components within normal limits  CBC WITH DIFFERENTIAL/PLATELET - Abnormal; Notable for the following components:   Lymphs Abs 0.5 (*)    All other components within normal limits    EKG None  Radiology Dg Chest Portable 1 View  Result Date: 08/27/2017 CLINICAL DATA:  Respiratory distress. Mucus plugging trachea. Esophageal cancer. EXAM: PORTABLE CHEST 1 VIEW COMPARISON:  08/24/2017 FINDINGS: The tracheostomy tube tip is 3.5 cm above the carina. Plate and screw fixators along the right lower neck. Atherosclerotic calcification of the aortic arch. Suspected subsegmental atelectasis along the right hemidiaphragm. Low lung volumes. Heart size within normal limits for projection. Faint accentuation of the  interstitium in the left chest is probably due to leftward rotation. IMPRESSION: 1. Mild subsegmental atelectasis at the right lung base. 2.  Atherosclerotic calcification of the aortic arch. 3. Tracheostomy tube appears satisfactorily positioned. Electronically Signed   By: Van Clines M.D.   On: 08/27/2017 18:48    Procedures Procedures (including critical care time)  CRITICAL CARE Performed by: Carrol Bondar   Total critical care time: 38 minutes  Critical care time was exclusive of separately billable procedures and treating other patients.  Critical care was necessary to treat or prevent imminent or life-threatening deterioration.  Critical care was time spent personally by me on the following activities: development of treatment plan with patient and/or surrogate as well as nursing, discussions with consultants, evaluation of patient's response to treatment, examination of patient, obtaining history from patient or surrogate, ordering and performing treatments and interventions, ordering and review of laboratory studies, ordering and review of radiographic studies, pulse oximetry and re-evaluation of patient's condition.   Medications Ordered in ED Medications  albuterol (PROVENTIL) (2.5 MG/3ML) 0.083% nebulizer solution (2.5 mg  Given 08/27/17 1827)     Initial Impression / Assessment and Plan / ED Course  I have reviewed the triage vital signs and the nursing notes.  Pertinent labs & imaging results that were available during my care of the patient were reviewed by me and considered in my medical decision making (see chart for details).     67 year old male comes in with chief complaint of respiratory distress.  Patient's O2 sats were noted to be at 70s on room air.  On exam, there is poor aeration.  Exam is concerning for mucous plug.  Respiratory team called immediately, and they responded immediately.  After multiple attempts to suction, they were able to get  mucous plug out, thereafter patient;s breathing improved dramatically.  Patient has been seen by critical care team thereafter, and they have cleared him from their perspective if his breathing continues to be stable.  Patient has been reassessed every hour for 3 times now, and suctioned 2 more times.  He continues to have normal respiratory effort, no hypoxia.  Chest x-ray is clear.  We will discharge patient with PTR to home.  Strict ER return precautions have been discussed.  I also spoke with patient's trach team, and it appears that patient has missed a follow-up appointment, and they will be happy to  see him in the clinic as soon as possible.  Final Clinical Impressions(s) / ED Diagnoses   Final diagnoses:  Acute respiratory failure with hypoxia ([place])  Mucus plugging of bronchi    ED Discharge Orders    None       Varney Biles, MD 08/27/17 2154    Varney Biles, MD 08/28/17 0730

## 2017-08-27 NOTE — ED Notes (Signed)
Left vm msg for pt dtr [name] [phone]

## 2017-08-27 NOTE — ED Notes (Signed)
EDMD at bedside

## 2017-08-27 NOTE — ED Notes (Signed)
Respiratory Techs at bedside

## 2017-08-28 ENCOUNTER — Ambulatory Visit (HOSPITAL_COMMUNITY): Payer: Medicare Other

## 2017-08-28 NOTE — Progress Notes (Signed)
Follow-up appointment made for trach clinic on Wednesday, July 24 at 3pm.  Attempted to leave message at patient's home/cell number [phone]) with no success.  Left message his daughter, [name] at [phone] to return call to trach clinic.     Kennieth Rad, AGACNP-BC [facility] Pgr: [phone] or if no answer [phone] 08/28/2017, 1:58 PM

## 2017-08-31 ENCOUNTER — Telehealth (HOSPITAL_COMMUNITY): Payer: Self-pay

## 2017-08-31 ENCOUNTER — Ambulatory Visit (HOSPITAL_COMMUNITY)
Admission: EM | Admit: 2017-08-31 | Discharge: 2017-08-31 | Disposition: A | Discharge status: Home / Self Care | Payer: Medicare Other | Attending: Internal Medicine | Admitting: Internal Medicine

## 2017-08-31 ENCOUNTER — Encounter (HOSPITAL_COMMUNITY): Payer: Self-pay | Admitting: Emergency Medicine

## 2017-08-31 DIAGNOSIS — R0602 Shortness of breath: Secondary | ICD-10-CM

## 2017-08-31 DIAGNOSIS — R509 Fever, unspecified: Secondary | ICD-10-CM

## 2017-08-31 MED ORDER — IPRATROPIUM-ALBUTEROL 0.5-2.5 (3) MG/3ML IN SOLN: 3.0000 mL | Freq: Four times a day (QID) | RESPIRATORY_TRACT | 1 refills | Status: AC | PRN

## 2017-08-31 MED ORDER — SULFAMETHOXAZOLE-TRIMETHOPRIM 200-40 MG/5ML PO SUSP: 20.0000 mL | Freq: Two times a day (BID) | ORAL | 0 refills | Status: AC

## 2017-08-31 NOTE — ED Provider Notes (Signed)
[facility]    CSN: [id number] Arrival date & time: 08/31/17  1252     History   Chief Complaint Chief Complaint  Patient presents with  . Medication Refill    HPI Nicholas Cobb is a 67 y.o. male.   He is a trach patient, and has had a couple of recent ED visits for increased dyspnea.  Symptoms are stable today, but has a low-grade temp at 100.3.  Patient requests refill of his DuoNeb medication for nebulizer machine.    HPI  Past Medical History:  Diagnosis Date  . Allergy   . Cancer ([place]) 1999   throat cancer  . Cervical spondylolysis 04/30/2011   severe  . Cervical spondylosis 03/25/2011  . Change in voice   . Chronic pain 03/25/2011  . Chronic sinusitis 04/27/2011  . Depression   . Difficulty urinating   . Emphysema 03/25/2011  . Essential hypertension, benign 12/03/2012  . GERD (gastroesophageal reflux disease) 03/25/2011  . Hearing loss   . Impaired glucose tolerance 04/27/2011  . 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 Rash   . Trouble swallowing   . Type II or unspecified type diabetes mellitus without mention of complication, uncontrolled 04/27/2011  . Ulcer   . UTI (lower urinary tract infection)   . Xerostomia 03/25/2011    Patient Active Problem List   Diagnosis Date Noted  . Elevated troponin 07/27/2017  . Hyperkalemia 07/27/2017  . Hypertensive urgency 07/26/2017  . Hypoxia   . Tracheostomy malfunction ([place]) 03/01/2017  . Trachea displaced 12/29/2016  . Other tracheostomy complication ([place]) 70/26/3785  . Radionecrosis   . Tracheostomy care ([place])   . Hypoxemia   . Hemoptysis 01/26/2016  . Throat cancer ([place])   . Controlled diabetes mellitus type 2 with complications ([place])   . Acute on chronic respiratory failure with hypoxia ([place]) 06/14/2015  . Respiratory failure ([place]) 06/13/2015  . Tracheostomy dependence ([place])   . Cellulitis and abscess of head   . Cellulitis of face   . Tracheostomy status ([place])   . Acute sinus infection  10/29/2013  . Rash 10/29/2013  . Hyponatremia 12/03/2012  . Essential hypertension 12/03/2012  . Cervical spondylolysis 04/30/2011  . Chronic sinusitis 04/27/2011  . Diabetes mellitus without complication ([place]) 88/50/2774  . Erectile dysfunction 04/19/2011  . Preventative health care 03/25/2011  . Cervical spondylosis 03/25/2011  . Pulmonary emphysema ([place]) 03/25/2011  . GERD (gastroesophageal reflux disease) 03/25/2011  . Chronic pain 03/25/2011  . 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 Xerostomia 03/25/2011  . Tracheostomy in place [facility]) 02/04/2011  . Laryngeal cancer ([place]) 02/04/2011  . Encounter for PEG (percutaneous endoscopic gastrostomy) ([place]) 02/04/2011  . Attention to G-tube [facility]) 11/07/2010    Past Surgical History:  Procedure Laterality Date  . GASTROSTOMY TUBE PLACEMENT    . IR GENERIC HISTORICAL  04/03/2016   IR CM INJ ANY COLONIC TUBE W/FLUORO 04/03/2016 [name], [name]  . PEG TUBE PLACEMENT  11/1999  . TRACHEOSTOMY  11/1999  . TRACHEOSTOMY TUBE PLACEMENT  11/24/2016   Procedure: EMERGENT TRACHEOSTOMY CHANGE;  Surgeon: Jerrell Belfast, MD;  Location: [facility];  Service: ENT;;  . TRACHEOSTOMY TUBE PLACEMENT N/A 12/29/2016   Procedure: TRACHEOSTOMY;  Surgeon: Melissa Montane, MD;  Location: [facility];  Service: ENT;  Laterality: N/A;       Home Medications    Prior to Admission medications   Medication Sig Start Date End Date Taking? Authorizing Provider  albuterol (PROVENTIL HFA;VENTOLIN HFA) 108 (90 Base) MCG/ACT inhaler  Inhale 2 puffs into the lungs every 4 (four) hours as needed for wheezing or shortness of breath. 03/09/16   [name], NP  AMBULATORY NON FORMULARY MEDICATION Trach(Shiley 6.0 XLT UP uncuffed), Trach Care Kit, Trach holders, Disposable inner cannulas 07/08/15   Gildardo Cranker, DO  betamethasone dipropionate 0.05 % lotion Apply topically 2 (two) times daily. To rash on leg Patient not taking: Reported on 03/05/2017 10/20/15   Gildardo Cranker, DO  IBU 400 MG tablet take 1 tablet by mouth three times a day if needed for pain Patient taking differently: Take 400 mg by mouth three times a day as needed for pain 01/17/17   [name], NP  ipratropium-albuterol (DUONEB) 0.5-2.5 (3) MG/3ML SOLN Take 3 mLs by nebulization every 6 (six) hours as needed (for wheezing or shortness of breath). 08/31/17   Wynona Luna, MD  losartan (COZAAR) 100 MG tablet Place 1 tablet (100 mg total) into feeding tube daily. 11/27/16   Cherene Altes, MD  naphazoline-pheniramine (NAPHCON-A) 0.025-0.3 % ophthalmic solution Place 2 drops into both eyes 4 (four) times daily as needed for eye irritation or allergies.     [provider]  Nutritional Supplements (FEEDING SUPPLEMENT, JEVITY 1.2 CAL,) LIQD Place 474 mLs into feeding tube every 6 (six) hours. 11/27/16   Cherene Altes, MD  oxymetazoline (12 HOUR NASAL SPRAY) 0.05 % nasal spray Place 2 sprays into the nose 2 (two) times daily as needed for congestion.     [provider]  ranitidine (ZANTAC) 150 MG tablet Take 150 mg per tube two times a day 11/27/16   Cherene Altes, MD  sulfamethoxazole-trimethoprim (BACTRIM,SEPTRA) 200-40 MG/5ML suspension Take 20 mLs by mouth 2 (two) times daily for 5 days. 08/31/17 09/05/17  Wynona Luna, MD  triamcinolone cream (KENALOG) 0.1 % Apply 1 application topically 2 (two) times daily. APPLY TO AFFECTED AREAS 11/27/16   Cherene Altes, MD    Family History Family History  Problem Relation Age of Onset  . Diabetes Mother   . Heart disease Father   . Stroke Other   . Diabetes Other   . Cancer Other        lung cancer    Social History Social History   Tobacco Use  . Smoking status: Former Smoker    Packs/day: 1.00    Years: 23.00    Pack years: 23.00    Last attempt to quit: 11/06/1996    Years since quitting: 20.8  . Smokeless tobacco: Never Used  Substance Use Topics  . Alcohol use: No     Alcohol/week: 0.0 oz  . Drug use: No     Allergies   Oxybutynin chloride and Codeine   Review of Systems Review of Systems  All other systems reviewed and are negative.    Physical Exam Triage Vital Signs ED Triage Vitals [08/31/17 1350]  Enc Vitals Group     BP (!) 157/85     Pulse Rate 89     Resp (!) 33     Temp 100.3 F (37.9 C)     Temp Source Oral     SpO2 98 %     Weight      Height      Pain Score    Updated Vital Signs BP (!) 157/85 (BP Location: Left Arm)   Pulse 89   Temp 100.3 F (37.9 C) (Oral)   Resp (!) 33   SpO2 98%  Physical Exam  Constitutional:  He is oriented to person, place, and time. No distress.  Alert, nicely groomed  HENT:  Trach patient  Eyes:  Conjugate gaze, no eye redness/drainage  Neck: Neck supple.  Cardiovascular: Normal rate and regular rhythm.  Pulmonary/Chest: No respiratory distress.  Symmetric breath sounds, somewhat diminished posteriorly, with upper airway type rhonchi.  Abdominal: He exhibits no distension.  Musculoskeletal: Normal range of motion.  Trace pitting edema bilaterally  Neurological: He is alert and oriented to person, place, and time.  Skin: Skin is warm and dry.  No cyanosis  Nursing note and vitals reviewed.     Final Clinical Impressions(s) / UC Diagnoses   Final diagnoses:  Shortness of breath  Fever, unspecified     Discharge Instructions     Refill for ipratropium/albuterol nebulizer solution given.  Prescription for trimethoprim/sulfa, for fever and increased feeling of breathlessness also sent to the pharmacy.  Recheck or followup with your primary care provider for further evaluation if not improving as expected in a few days.    ED Prescriptions    Medication Sig Dispense Auth. Provider   ipratropium-albuterol (DUONEB) 0.5-2.5 (3) MG/3ML SOLN Take 3 mLs by nebulization every 6 (six) hours as needed (for wheezing or shortness of breath). 360 mL Wynona Luna, MD    sulfamethoxazole-trimethoprim (BACTRIM,SEPTRA) 200-40 MG/5ML suspension Take 20 mLs by mouth 2 (two) times daily for 5 days. 100 mL Wynona Luna, MD        Wynona Luna, MD 09/03/17 [phone]

## 2017-08-31 NOTE — Discharge Instructions (Signed)
Refill for ipratropium/albuterol nebulizer solution given.  Prescription for trimethoprim/sulfa, for fever and increased feeling of breathlessness also sent to the pharmacy.  Recheck or followup with your primary care provider for further evaluation if not improving as expected in a few days.

## 2017-08-31 NOTE — ED Triage Notes (Signed)
Pt with trach and hx of multiple visits in past; pt sts needs medication for neb machine

## 2017-09-05 ENCOUNTER — Inpatient Hospital Stay (HOSPITAL_COMMUNITY): Admission: RE | Admit: 2017-09-05 | Payer: Medicare Other | Source: Ambulatory Visit

## 2017-09-26 ENCOUNTER — Encounter (HOSPITAL_COMMUNITY): Payer: Self-pay | Admitting: Emergency Medicine

## 2017-09-26 ENCOUNTER — Emergency Department (HOSPITAL_COMMUNITY): Payer: Medicare Other

## 2017-09-26 ENCOUNTER — Encounter (HOSPITAL_COMMUNITY): Payer: Self-pay | Admitting: Pharmacy Technician

## 2017-09-26 ENCOUNTER — Emergency Department (HOSPITAL_COMMUNITY)
Admission: EM | Admit: 2017-09-26 | Discharge: 2017-09-26 | Disposition: A | Discharge status: Home / Self Care | Payer: Medicare Other | Attending: Emergency Medicine | Admitting: Emergency Medicine

## 2017-09-26 ENCOUNTER — Other Ambulatory Visit: Payer: Self-pay

## 2017-09-26 ENCOUNTER — Emergency Department (HOSPITAL_COMMUNITY)
Admission: EM | Admit: 2017-09-26 | Discharge: 2017-09-27 | Disposition: A | Discharge status: Home / Self Care | Payer: Medicare Other | Source: Home / Self Care | Attending: Emergency Medicine | Admitting: Emergency Medicine

## 2017-09-26 DIAGNOSIS — I959 Hypotension, unspecified: Secondary | ICD-10-CM | POA: Insufficient documentation

## 2017-09-26 DIAGNOSIS — E119 Type 2 diabetes mellitus without complications: Secondary | ICD-10-CM | POA: Insufficient documentation

## 2017-09-26 DIAGNOSIS — Z85818 Personal history of malignant neoplasm of other sites of lip, oral cavity, and pharynx: Secondary | ICD-10-CM | POA: Insufficient documentation

## 2017-09-26 DIAGNOSIS — R0602 Shortness of breath: Secondary | ICD-10-CM | POA: Insufficient documentation

## 2017-09-26 DIAGNOSIS — R0682 Tachypnea, not elsewhere classified: Secondary | ICD-10-CM | POA: Insufficient documentation

## 2017-09-26 DIAGNOSIS — I10 Essential (primary) hypertension: Secondary | ICD-10-CM

## 2017-09-26 DIAGNOSIS — R4182 Altered mental status, unspecified: Secondary | ICD-10-CM | POA: Diagnosis present

## 2017-09-26 DIAGNOSIS — R404 Transient alteration of awareness: Secondary | ICD-10-CM | POA: Diagnosis not present

## 2017-09-26 DIAGNOSIS — Z87891 Personal history of nicotine dependence: Secondary | ICD-10-CM | POA: Insufficient documentation

## 2017-09-26 DIAGNOSIS — R000 Tachycardia, unspecified: Secondary | ICD-10-CM | POA: Diagnosis not present

## 2017-09-26 DIAGNOSIS — R0902 Hypoxemia: Secondary | ICD-10-CM | POA: Diagnosis not present

## 2017-09-26 DIAGNOSIS — R410 Disorientation, unspecified: Secondary | ICD-10-CM

## 2017-09-26 DIAGNOSIS — R0689 Other abnormalities of breathing: Secondary | ICD-10-CM | POA: Diagnosis not present

## 2017-09-26 LAB — URINALYSIS, ROUTINE W REFLEX MICROSCOPIC
BILIRUBIN URINE: NEGATIVE
GLUCOSE, UA: NEGATIVE mg/dL
Ketones, ur: NEGATIVE mg/dL
LEUKOCYTES UA: NEGATIVE
NITRITE: NEGATIVE
PROTEIN: NEGATIVE mg/dL
Specific Gravity, Urine: 1.017 (ref 1.005–1.030)
pH: 6 (ref 5.0–8.0)

## 2017-09-26 LAB — COMPREHENSIVE METABOLIC PANEL
ALT: 29 U/L (ref 0–44)
AST: 25 U/L (ref 15–41)
Albumin: 2.5 g/dL — ABNORMAL LOW (ref 3.5–5.0)
Alkaline Phosphatase: 149 U/L — ABNORMAL HIGH (ref 38–126)
Anion gap: 9 (ref 5–15)
BUN: 15 mg/dL (ref 8–23)
CO2: 27 mmol/L (ref 22–32)
CREATININE: 0.97 mg/dL (ref 0.61–1.24)
Calcium: 9.1 mg/dL (ref 8.9–10.3)
Chloride: 100 mmol/L (ref 98–111)
GFR calc Af Amer: >60 mL/min (ref >60)
GFR calc non Af Amer: >60 mL/min (ref >60)
GLUCOSE: 123 mg/dL — AB (ref 70–99)
Potassium: 4.4 mmol/L (ref 3.5–5.1)
SODIUM: 136 mmol/L (ref 135–145)
Total Bilirubin: 0.4 mg/dL (ref 0.3–1.2)
Total Protein: 7.2 g/dL (ref 6.5–8.1)

## 2017-09-26 LAB — I-STAT VENOUS BLOOD GAS, ED
Acid-Base Excess: 4 mmol/L — ABNORMAL HIGH (ref 0.0–2.0)
Bicarbonate: 30.1 mmol/L — ABNORMAL HIGH (ref 20.0–28.0)
O2 SAT: 26 %
PH VEN: 7.37 (ref 7.250–7.430)
PO2 VEN: 19 mmHg — AB (ref 32.0–45.0)
TCO2: 32 mmol/L (ref 22–32)
pCO2, Ven: 52.1 mmHg (ref 44.0–60.0)

## 2017-09-26 LAB — TROPONIN I

## 2017-09-26 LAB — CBC WITH DIFFERENTIAL/PLATELET
Basophils Absolute: 0 10*3/uL (ref 0.0–0.1)
Basophils Relative: 0 %
EOS ABS: 0.2 10*3/uL (ref 0.0–0.7)
Eosinophils Relative: 2 %
HCT: 36.7 % — ABNORMAL LOW (ref 39.0–52.0)
HEMOGLOBIN: 11.5 g/dL — AB (ref 13.0–17.0)
LYMPHS ABS: 0.5 10*3/uL — AB (ref 0.7–4.0)
Lymphocytes Relative: 6 %
MCH: 28.2 pg (ref 26.0–34.0)
MCHC: 31.3 g/dL (ref 30.0–36.0)
MCV: 90 fL (ref 78.0–100.0)
Monocytes Absolute: 1 10*3/uL (ref 0.1–1.0)
Monocytes Relative: 10 %
Neutro Abs: 7.7 10*3/uL (ref 1.7–7.7)
Neutrophils Relative %: 82 %
Platelets: 343 10*3/uL (ref 150–400)
RBC: 4.08 MIL/uL — ABNORMAL LOW (ref 4.22–5.81)
RDW: 13.8 % (ref 11.5–15.5)
WBC: 9.4 10*3/uL (ref 4.0–10.5)

## 2017-09-26 LAB — I-STAT CG4 LACTIC ACID, ED: LACTIC ACID, VENOUS: 0.99 mmol/L (ref 0.5–1.9)

## 2017-09-26 NOTE — ED Provider Notes (Signed)
Emergency Department Provider Note   I have reviewed the triage vital signs and the nursing notes.   HISTORY  Chief Complaint Altered Mental Status and Shortness of Breath   HPI Nicholas Cobb is a 67 y.o. male with PMH of tracheostomy dependence with throat cancer history, HTN, Emphysema, and DM presents to the emergency department for evaluation of confusion.  Patient arrived by EMS.  He was found confused at his apartment complex.  EMS states that he was convinced he lived in a particular building and when bystanders tried to correct him and help him find his way home he refused.  Ultimately paramedics were called.  They found the patient to be hypotensive on scene with rapid breathing and tachycardia.  During transport he received 400 mL's of IV fluids and had a nebulizer treatment.  Vital signs have improved.   Level 5 caveat: Trach dependent. Writing all information at bedside.   Past Medical History:  Diagnosis Date  . Allergy   . Cancer ([place]) 1999   throat cancer  . Cervical spondylolysis 04/30/2011   severe  . Cervical spondylosis 03/25/2011  . Change in voice   . Chronic pain 03/25/2011  . Chronic sinusitis 04/27/2011  . Depression   . Difficulty urinating   . Emphysema 03/25/2011  . Essential hypertension, benign 12/03/2012  . GERD (gastroesophageal reflux disease) 03/25/2011  . Hearing loss   . Impaired glucose tolerance 04/27/2011  . 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 Rash   . Trouble swallowing   . Type II or unspecified type diabetes mellitus without mention of complication, uncontrolled 04/27/2011  . Ulcer   . UTI (lower urinary tract infection)   . Xerostomia 03/25/2011    Patient Active Problem List   Diagnosis Date Noted  . Elevated troponin 07/27/2017  . Hyperkalemia 07/27/2017  . Hypertensive urgency 07/26/2017  . Hypoxia   . Tracheostomy malfunction ([place]) 03/01/2017  . Trachea displaced 12/29/2016  . Other tracheostomy complication ([place])  83/15/1761  . Radionecrosis   . Tracheostomy care ([place])   . Hypoxemia   . Hemoptysis 01/26/2016  . Throat cancer ([place])   . Controlled diabetes mellitus type 2 with complications ([place])   . Acute on chronic respiratory failure with hypoxia ([place]) 06/14/2015  . Respiratory failure ([place]) 06/13/2015  . Tracheostomy dependence ([place])   . Cellulitis and abscess of head   . Cellulitis of face   . Tracheostomy status ([place])   . Acute sinus infection 10/29/2013  . Rash 10/29/2013  . Hyponatremia 12/03/2012  . Essential hypertension 12/03/2012  . Cervical spondylolysis 04/30/2011  . Chronic sinusitis 04/27/2011  . Diabetes mellitus without complication ([place]) 60/73/7106  . Erectile dysfunction 04/19/2011  . Preventative health care 03/25/2011  . Cervical spondylosis 03/25/2011  . Pulmonary emphysema ([place]) 03/25/2011  . GERD (gastroesophageal reflux disease) 03/25/2011  . Chronic pain 03/25/2011  . 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 Xerostomia 03/25/2011  . Tracheostomy in place [facility]) 02/04/2011  . Laryngeal cancer ([place]) 02/04/2011  . Encounter for PEG (percutaneous endoscopic gastrostomy) ([place]) 02/04/2011  . Attention to G-tube [facility]) 11/07/2010    Past Surgical History:  Procedure Laterality Date  . GASTROSTOMY TUBE PLACEMENT    . IR GENERIC HISTORICAL  04/03/2016   IR CM INJ ANY COLONIC TUBE W/FLUORO 04/03/2016 [name], [name]  . PEG TUBE PLACEMENT  11/1999  . TRACHEOSTOMY  11/1999  . TRACHEOSTOMY TUBE PLACEMENT  11/24/2016   Procedure: EMERGENT TRACHEOSTOMY CHANGE;  Surgeon: Jerrell Belfast, MD;  Location: [facility] OR;  Service: ENT;;  . TRACHEOSTOMY TUBE PLACEMENT N/A 12/29/2016   Procedure: TRACHEOSTOMY;  Surgeon: Melissa Montane, MD;  Location: [facility] OR;  Service: ENT;  Laterality: N/A;   Allergies Oxybutynin chloride and Codeine  Family History  Problem Relation Age of Onset  . Diabetes Mother   . Heart disease Father   . Stroke Other   . Diabetes Other   . Cancer  Other        lung cancer    Social History Social History   Tobacco Use  . Smoking status: Former Smoker    Packs/day: 1.00    Years: 23.00    Pack years: 23.00    Last attempt to quit: 11/06/1996    Years since quitting: 20.9  . Smokeless tobacco: Never Used  Substance Use Topics  . Alcohol use: No    Alcohol/week: 0.0 standard drinks  . Drug use: No    Review of Systems  Constitutional: No fever/chills Eyes: No visual changes. ENT: No sore throat. Cardiovascular: Denies chest pain. Respiratory: Positive shortness of breath. Gastrointestinal: No abdominal pain.  No nausea, no vomiting.  No diarrhea.  No constipation. Genitourinary: Negative for dysuria. Musculoskeletal: Negative for back pain. Skin: Negative for rash. Neurological: Negative for headaches, focal weakness or numbness.  10-point ROS otherwise negative.  ____________________________________________   PHYSICAL EXAM:  VITAL SIGNS: ED Triage Vitals [09/26/17 1315]  Enc Vitals Group     BP 111/69     Pulse Rate (!) 107     Resp (!) 27     Temp 98 F (36.7 C)     Temp Source Axillary     SpO2 100 %    Constitutional: Alert and oriented. Well appearing and in no acute distress. Eyes: Conjunctivae are normal.  Head: Atraumatic. Nose: No congestion/rhinnorhea. Mouth/Throat: Mucous membranes are moist. Baseline face/jaw swelling noted.  Neck: No stridor. Well-appearing trach open to room air.  Cardiovascular: Tachycardia. Good peripheral circulation. Grossly normal heart sounds.   Respiratory: Slight increased respiratory effort.  No retractions. Lungs CTAB. Gastrointestinal: Soft and nontender. No distention.  Musculoskeletal: No lower extremity tenderness nor edema. No gross deformities of extremities. Neurologic: No gross focal neurologic deficits are appreciated.  Skin:  Skin is warm, dry and intact. No rash noted.  ____________________________________________   LABS (all labs ordered are  listed, but only abnormal results are displayed)  Labs Reviewed  COMPREHENSIVE METABOLIC PANEL - Abnormal; Notable for the following components:      Result Value   Glucose, Bld 123 (*)    Albumin 2.5 (*)    Alkaline Phosphatase 149 (*)    All other components within normal limits  CBC WITH DIFFERENTIAL/PLATELET - Abnormal; Notable for the following components:   RBC 4.08 (*)    Hemoglobin 11.5 (*)    HCT 36.7 (*)    Lymphs Abs 0.5 (*)    All other components within normal limits  I-STAT VENOUS BLOOD GAS, ED - Abnormal; Notable for the following components:   pO2, Ven 19.0 (*)    Bicarbonate 30.1 (*)    Acid-Base Excess 4.0 (*)    All other components within normal limits  CULTURE, BLOOD (ROUTINE X 2)  CULTURE, BLOOD (ROUTINE X 2)  TROPONIN I  I-STAT CG4 LACTIC ACID, ED   ____________________________________________  EKG   EKG Interpretation  Date/Time:  Wednesday September 26 2017 13:13:41 EDT Ventricular Rate:  108 PR Interval:    QRS Duration: 69 QT Interval:  316 QTC Calculation:  424 R Axis:   100 Text Interpretation:  Sinus tachycardia Consider right atrial enlargement Right axis deviation No STEMI. Similar to prior tracing.  Confirmed by Nanda Quinton [phone]) on 09/26/2017 1:47:53 PM       ____________________________________________  RADIOLOGY  Dg Chest Port 1 View  Result Date: 09/26/2017 CLINICAL DATA:  Shortness of breath EXAM: PORTABLE CHEST 1 VIEW COMPARISON:  08/27/2017 FINDINGS: The heart size and mediastinal contours are within normal limits. Both lungs are clear. The visualized skeletal structures are unremarkable. IMPRESSION: No active disease. Electronically Signed   By: Kathreen Devoid   On: 09/26/2017 13:33    ____________________________________________   PROCEDURES  Procedure(s) performed:   Procedures  None ____________________________________________   INITIAL IMPRESSION / ASSESSMENT AND PLAN / ED COURSE  Pertinent labs & imaging  results that were available during my care of the patient were reviewed by me and considered in my medical decision making (see chart for details).  The emergency department and of confusion.  Patient seems awake and alert.  He is writing most of his responses on paper.  On scene he was found to be hypotensive and tachycardic but vitals have improved with gentle IV fluids and neb treatment.  She has chronic respiratory failure.  No indication for trach change at this time.  Does follow with the trach clinic here. CXR reviewed with no acute findings.   Portable CXR reviewed without acute findings. Labs are unremarkable. Saturations here are normal or RA. Afebrile. Well-appearing. Patient is agitated and wants to be discharged. Will not go by PTAR. Tried to reach family by phone without answer. Patient communicating on paper without difficulty. Will discharge.   At this time, I do not feel there is any life-threatening condition present. I have reviewed and discussed all results (EKG, imaging, lab, urine as appropriate), exam findings with patient. I have reviewed nursing notes and appropriate previous records.  I feel the patient is safe to be discharged home without further emergent workup. Discussed usual and customary return precautions. Patient and family (if present) verbalize understanding and are comfortable with this plan.  Patient will follow-up with their primary care provider. If they do not have a primary care provider, information for follow-up has been provided to them. All questions have been answered.  ____________________________________________  FINAL CLINICAL IMPRESSION(S) / ED DIAGNOSES  Final diagnoses:  Disorientation    Note:  This document was prepared using Dragon voice recognition software and may include unintentional dictation errors.  Nanda Quinton, MD Emergency Medicine    Long, Wonda Olds, MD 09/26/17 2150

## 2017-09-26 NOTE — ED Notes (Signed)
Respiratory called to F6 to suction patient.

## 2017-09-26 NOTE — ED Notes (Signed)
CSW at bedside.

## 2017-09-26 NOTE — ED Notes (Signed)
ED Provider at bedside. 
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
## 2017-09-26 NOTE — ED Notes (Signed)
RT at bedside to suction pt.

## 2017-09-26 NOTE — Progress Notes (Signed)
RT note: RT called by patient to suction tach. RT heard wheezing from trach. RT removed inner trach inner cannula and noticed it was partially plugged with secretions. RT cleaned inner cannula replaced and suctioned patients trach. Noises went away and patients breathing was unlabored. Vital signs stable at this time. RN and MD aware.

## 2017-09-26 NOTE — Progress Notes (Signed)
RT note: RT suctioned patient trach per his request. Small amount of thin clear/white secretions. Patient tolerated well vital signs stable. Patient refuses to wear Aero sal trach collar.

## 2017-09-26 NOTE — ED Notes (Signed)
Left VM for pts daughter [name] [phone]

## 2017-09-26 NOTE — ED Notes (Signed)
Patient verbalizes understanding of discharge instructions. Opportunity for questioning and answers were provided. 

## 2017-09-26 NOTE — ED Notes (Signed)
Family stating o2 sats are down and trach needs to be cleaned. Pt O2 sats are 94% on RA and per previous RN trach was cleaned prior to previous discharged today.
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
## 2017-09-26 NOTE — Discharge Instructions (Signed)
You were seen in the ED today with confusion. Your labs and x-rays are normal. Follow up with your PCP by phone tomorrow and call the trach clinic for follow up. Return to the ED with any new or worsening problems.

## 2017-09-26 NOTE — Care Management (Signed)
CM spoke with daughter Renard Matter concerning [place] recommendation, CM spoke with Daughter Renard Matter [phone] last night concerning [place] recommendation she agreed and no preference regarding [place] agencies. Elected on  [place] referral faxed out via [place]. CM made patient and family aware that [facility] nurse will contact them by phone 24- 48 hours post discharge home. Patient and daughter verbalized und teach back done. No further ED CM needs identified
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
## 2017-09-26 NOTE — ED Notes (Signed)
Patient transported to X-ray 
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
## 2017-09-26 NOTE — ED Notes (Signed)
Respiratory called to come suction trach.

## 2017-09-27 MED ORDER — IPRATROPIUM BROMIDE 0.02 % IN SOLN: 0.5000 mg | Freq: Once | RESPIRATORY_TRACT | Status: DC

## 2017-09-27 MED ORDER — ALBUTEROL SULFATE (2.5 MG/3ML) 0.083% IN NEBU: 5.0000 mg | INHALATION_SOLUTION | Freq: Once | RESPIRATORY_TRACT | Status: DC

## 2017-09-27 MED ORDER — IPRATROPIUM-ALBUTEROL 0.5-2.5 (3) MG/3ML IN SOLN
3.0000 mL | Freq: Once | RESPIRATORY_TRACT | Status: AC
  Administered 2017-09-27: 3 mL via RESPIRATORY_TRACT

## 2017-09-27 MED ORDER — IPRATROPIUM-ALBUTEROL 0.5-2.5 (3) MG/3ML IN SOLN
3.0000 mL | Freq: Once | RESPIRATORY_TRACT | Status: AC
  Administered 2017-09-27: 3 mL via RESPIRATORY_TRACT
  Filled 2017-09-27: qty 3

## 2017-09-27 MED ORDER — RANITIDINE HCL 150 MG/10ML PO SYRP
150.0000 mg | ORAL_SOLUTION | Freq: Once | ORAL | Status: AC
  Administered 2017-09-27: 150 mg via ORAL
  Filled 2017-09-27: qty 10

## 2017-09-27 MED ORDER — JEVITY 1.2 CAL PO LIQD
474.0000 mL | Freq: Four times a day (QID) | ORAL | Status: DC
  Administered 2017-09-27: 474 mL
  Filled 2017-09-27 (×2): qty 474

## 2017-09-27 NOTE — ED Notes (Signed)
Pt continues to call out and asking for trach to be suctioned. Respiratory at bedside stating that there is no secretions to be suctioned out and if he suctions again it may cause bleeding. Pt notified of this and notified of O2 saturation being over 94%.

## 2017-09-27 NOTE — ED Notes (Signed)
Pt requesting to be suctioned again. Pt notified that respiratory has been called and will be in room soon. Pt notified that O2 sats are greater than 94%% at this time.

## 2017-09-27 NOTE — Progress Notes (Signed)
CSW called pt's daughter, Elvin So. Pt's daughter just picked pt up. Confirmed that home health will be set up for pt. Pt's daughter stated that she will start looking for ALF for pt. CSW informed pt's daughter that a Education officer, museum will be included in home health orders to assist with Medicaid application and ALF placement.   Wendelyn Breslow, [facility]  [phone]

## 2017-09-27 NOTE — ED Notes (Addendum)
Pt needs to go to bathroom.  Pt initially wants to use bedside commode but insist he can walk.  RN attempted to see if Pt would be able to tolerate room air, but Pt started to remove his own equipment.  Pt has taken all of his VS monitoriing equipment off and wants the bed down. Pt walked to bathroom independently.

## 2017-09-27 NOTE — ED Notes (Signed)
Pt being suctioned at this time, pt has urinated on his pants, pt communicating effectively by writing, pt reports that he does not eat solid food, reordered appropriate breakfast tray, spoke with MD re: pt having facial swelling, per RT pt had facial swelling during his visit yesterday however it appears more swollen today per RT, MD to eval pt

## 2017-09-27 NOTE — ED Notes (Signed)
Respiratory at bedside.

## 2017-09-27 NOTE — ED Notes (Signed)
Pt's daughter called and she reports she will be here by 1600.

## 2017-09-27 NOTE — ED Notes (Addendum)
Pt hooked up to monitor with call bell at bedside. Pt has no complaints at this time.

## 2017-09-27 NOTE — ED Notes (Signed)
Pt verbalized understanding discharge instructions and denies any further needs or questions at this time. VS stable, ambulatory and steady gait.   

## 2017-09-27 NOTE — Progress Notes (Addendum)
   9:41am- CSW received call back from pt's daughter and was informed that she and landlord and having to get pt's locks replaced since opt lost keys. CSW was informed that this would be taking place around 2pm today. CSW advised daughter that replacing locks is okay however someone would need to pick pt up once that is finished. Daughter confirmed that she would be to pick pt up once that was complete.  CSW followed up with pt's daughter Elvin So regarding pt's apartment keys. CSW was advised that she is at the apartment office getting the keys for pt. Daughter expressed once more that lost keys and that she would have to get another key made for pt in order to get pt in the home. Daughter expressed that she would be at the ED soon with keys.    Virgie Dad Travante Knee, MSW, [facility] Clinical Social Worker [phone]

## 2017-09-27 NOTE — ED Notes (Signed)
Pt calling out saying his trach needs to be suctioned. Respiratory called to assess patient.

## 2017-09-27 NOTE — Progress Notes (Signed)
Pt. Was requesting for his trache to be cleaned. RT checked pt. Inner cannula and noticed it was clogged at the end. RT cleaned the inner cannula and placed it back in for the pt. The pt. Then complained of chest pain. RT notified the RN and the RN took over. No issues at this time. Pt. sats in the high 90"s.

## 2017-09-27 NOTE — Progress Notes (Signed)
RT note: RT placed suction setup and  Ambu bag in patient room. RT bag lavaged and suctioned patient trach small brown plugs. Replaced inner cannula. Patient tolerated well vital signs stable at this time on room air.

## 2017-09-27 NOTE — ED Provider Notes (Signed)
SW has arranged for patient to go back to his house. Home health ordered.   Nicholas Gambler, MD 09/27/17 [phone]

## 2017-09-27 NOTE — ED Notes (Signed)
Patient has to be tube Feed, No Diet was ordered.

## 2017-09-27 NOTE — ED Notes (Signed)
Pt is c/o SOB, Pt reassessed by RN. Pt still on blow by O2 with SPO2 at 100%.  Pt taken off O2 and SPO2 stayed at 98%. MD made aware and Pt given breathing Tx.  Pt requesting suctioning.  RN was told in report Pt is able to self suction like he does at home.  Pt provided Pt with suction equipment.

## 2017-09-27 NOTE — ED Notes (Signed)
Pt was off blow by O2 while in the bathroom.  RN asked Pt to wait so that portable O2 can be used.  Pt denied and closed bathroom door on RN.  Pt SPO2 after BM was 95% on RA.  Pt bed was soaked with Urine.  RN changed sheets and provided Pt with scrubs to wear.

## 2017-09-27 NOTE — ED Provider Notes (Signed)
12:54 AM Patient care assumed at change of shift from Dr. Laverta Baltimore pending chest x-ray and urinalysis; already previously evaluated in the ED yesterday.  These lab and imaging results have been reviewed and do not show acute or infectious process.  Patient unable to be discharged as he misplaced the keys to his home.  He is tracheostomy dependent and necessitates the use of respiratory equipment.  Will board in the ED overnight where intermittent suctioning of tracheostomy is available.  Will attempt to facilitate discharge in the morning.  6:51 AM Patient signed out to Margarita Mail, PA-C at change of shift who will assume care and disposition appropriately.   Results for orders placed or performed during the hospital encounter of 09/26/17  Urinalysis, Routine w reflex microscopic  Result Value Ref Range   Color, Urine YELLOW YELLOW   APPearance CLEAR CLEAR   Specific Gravity, Urine 1.017 1.005 - 1.030   pH 6.0 5.0 - 8.0   Glucose, UA NEGATIVE NEGATIVE mg/dL   Hgb urine dipstick SMALL (A) NEGATIVE   Bilirubin Urine NEGATIVE NEGATIVE   Ketones, ur NEGATIVE NEGATIVE mg/dL   Protein, ur NEGATIVE NEGATIVE mg/dL   Nitrite NEGATIVE NEGATIVE   Leukocytes, UA NEGATIVE NEGATIVE   RBC / HPF 6-10 0 - 5 RBC/hpf   WBC, UA 0-5 0 - 5 WBC/hpf   Bacteria, UA RARE (A) NONE SEEN   Mucus PRESENT    Dg Chest 2 View  Result Date: 09/26/2017 CLINICAL DATA:  Shortness of breath EXAM: CHEST - 2 VIEW COMPARISON:  09/26/2017 FINDINGS: Cardiac shadow is within normal limits. The lungs are well aerated bilaterally. Minimal basilar atelectasis is seen new from the prior study. Postsurgical changes are noted and stable. No acute bony abnormality is seen. IMPRESSION: Mild bibasilar atelectasis. Electronically Signed   By: Inez Catalina M.D.   On: 09/26/2017 22:23   Dg Chest Port 1 View  Result Date: 09/26/2017 CLINICAL DATA:  Shortness of breath EXAM: PORTABLE CHEST 1 VIEW COMPARISON:  08/27/2017 FINDINGS: The heart  size and mediastinal contours are within normal limits. Both lungs are clear. The visualized skeletal structures are unremarkable. IMPRESSION: No active disease. Electronically Signed   By: Kathreen Devoid   On: 09/26/2017 13:33      Antonietta Breach, PA-C 09/27/17 [id number]    Ripley Fraise, MD 09/27/17 [phone]

## 2017-09-27 NOTE — ED Notes (Signed)
Lita Mains, MD at bedside speaking with pt, pt agitated and writing on paper that he wants a breathing treatment and he does not eat solid food, this RN has reassured him that nutrition has been ordered through pharmacy and that RT is in route to administer his breathing treatment, will continue to monitor

## 2017-09-27 NOTE — ED Notes (Signed)
Pt calling out for runny nose. Pt given tissues.

## 2017-10-01 ENCOUNTER — Other Ambulatory Visit: Payer: Self-pay

## 2017-10-01 LAB — CULTURE, BLOOD (ROUTINE X 2)
CULTURE: NO GROWTH
SPECIAL REQUESTS: ADEQUATE

## 2017-10-01 NOTE — Patient Outreach (Signed)
[facility]) Care Management  10/01/2017  TRI CHITTICK 04-15-1950 [id number]    Telephone Screen Referral Date : 09/28/2017 Referral Source: [place] Referral Reason: Ed Utilization Insurance:[employer]  Outreach attempt # 1 To patient. No Answer.  Tried to call both the work and home number.  The home number is disconnected and the second number was not accepting calls at this time. Unable to leave a message.    Plan: RN Health Coach will send letter. RN Health Coach will make another attempt to the patient within four business days. l   Lazaro Arms RN, BSN, [place] Direct Dial:  [phone] Fax: [phone]

## 2017-10-05 ENCOUNTER — Encounter (HOSPITAL_COMMUNITY): Payer: Self-pay | Admitting: Emergency Medicine

## 2017-10-05 ENCOUNTER — Other Ambulatory Visit: Payer: Self-pay

## 2017-10-05 ENCOUNTER — Emergency Department (HOSPITAL_COMMUNITY): Payer: Medicare Other

## 2017-10-05 ENCOUNTER — Emergency Department (HOSPITAL_COMMUNITY)
Admission: EM | Admit: 2017-10-05 | Discharge: 2017-10-05 | Discharge status: Against Medical Advice | Payer: Medicare Other | Attending: Emergency Medicine | Admitting: Emergency Medicine

## 2017-10-05 DIAGNOSIS — R0689 Other abnormalities of breathing: Secondary | ICD-10-CM | POA: Diagnosis not present

## 2017-10-05 DIAGNOSIS — R0682 Tachypnea, not elsewhere classified: Secondary | ICD-10-CM | POA: Diagnosis not present

## 2017-10-05 DIAGNOSIS — R000 Tachycardia, unspecified: Secondary | ICD-10-CM | POA: Diagnosis not present

## 2017-10-05 DIAGNOSIS — J9811 Atelectasis: Secondary | ICD-10-CM | POA: Diagnosis not present

## 2017-10-05 DIAGNOSIS — K9429 Other complications of gastrostomy: Secondary | ICD-10-CM | POA: Insufficient documentation

## 2017-10-05 DIAGNOSIS — I959 Hypotension, unspecified: Secondary | ICD-10-CM | POA: Diagnosis not present

## 2017-10-05 DIAGNOSIS — Z79899 Other long term (current) drug therapy: Secondary | ICD-10-CM | POA: Diagnosis not present

## 2017-10-05 DIAGNOSIS — R0902 Hypoxemia: Secondary | ICD-10-CM | POA: Diagnosis not present

## 2017-10-05 DIAGNOSIS — I10 Essential (primary) hypertension: Secondary | ICD-10-CM | POA: Diagnosis not present

## 2017-10-05 DIAGNOSIS — Z87891 Personal history of nicotine dependence: Secondary | ICD-10-CM | POA: Insufficient documentation

## 2017-10-05 DIAGNOSIS — R0602 Shortness of breath: Secondary | ICD-10-CM | POA: Diagnosis not present

## 2017-10-05 DIAGNOSIS — T85848A Pain due to other internal prosthetic devices, implants and grafts, initial encounter: Secondary | ICD-10-CM

## 2017-10-05 DIAGNOSIS — E119 Type 2 diabetes mellitus without complications: Secondary | ICD-10-CM | POA: Diagnosis not present

## 2017-10-05 DIAGNOSIS — G8918 Other acute postprocedural pain: Secondary | ICD-10-CM | POA: Diagnosis not present

## 2017-10-05 DIAGNOSIS — Z430 Encounter for attention to tracheostomy: Secondary | ICD-10-CM | POA: Diagnosis not present

## 2017-10-05 DIAGNOSIS — K9420 Gastrostomy complication, unspecified: Secondary | ICD-10-CM | POA: Diagnosis not present

## 2017-10-05 LAB — BASIC METABOLIC PANEL
Anion gap: 10 (ref 5–15)
BUN: 19 mg/dL (ref 8–23)
CO2: 27 mmol/L (ref 22–32)
Calcium: 9.2 mg/dL (ref 8.9–10.3)
Chloride: 93 mmol/L — ABNORMAL LOW (ref 98–111)
Creatinine, Ser: 0.99 mg/dL (ref 0.61–1.24)
GFR calc Af Amer: >60 mL/min (ref >60)
GFR calc non Af Amer: >60 mL/min (ref >60)
Glucose, Bld: 98 mg/dL (ref 70–99)
Potassium: 5.6 mmol/L — ABNORMAL HIGH (ref 3.5–5.1)
Sodium: 130 mmol/L — ABNORMAL LOW (ref 135–145)

## 2017-10-05 LAB — CBC
HCT: 35.8 % — ABNORMAL LOW (ref 39.0–52.0)
Hemoglobin: 11.1 g/dL — ABNORMAL LOW (ref 13.0–17.0)
MCH: 27.5 pg (ref 26.0–34.0)
MCHC: 31 g/dL (ref 30.0–36.0)
MCV: 88.8 fL (ref 78.0–100.0)
Platelets: 404 10*3/uL — ABNORMAL HIGH (ref 150–400)
RBC: 4.03 MIL/uL — ABNORMAL LOW (ref 4.22–5.81)
RDW: 14 % (ref 11.5–15.5)
WBC: 7.9 10*3/uL (ref 4.0–10.5)

## 2017-10-05 LAB — BRAIN NATRIURETIC PEPTIDE: B Natriuretic Peptide: 17.8 pg/mL (ref 0.0–100.0)

## 2017-10-05 NOTE — ED Notes (Signed)
Pt stable and wheeled out for discharge, states understanding follow up. Cab called

## 2017-10-05 NOTE — ED Triage Notes (Signed)
Per EMS pt lives at home, called out for problems with peg tube pt wandering around confused, oxygen 93% with non-rebreather. RR 40, BP manual 82/52. Pt A+Ox4. Pt lives with trach

## 2017-10-05 NOTE — Patient Outreach (Signed)
[facility]) Care Management  10/05/2017  Nicholas Cobb 29-May-1950 [id number]    Telephone Screen Referral Date : 09/28/2017 Referral Source: [place] Referral Reason: Ed Utilization Insurance:UHC  Outreach attempt # 2 to the patient.  Daughter [name] answered the phone.  Left HIPAA compliant voicemail with contact information.  Plan: RN Health Coach will make another outreach attempt to the patient within the next four business days.  Lazaro Arms RN, BSN, [place] Direct Dial:  [phone]  Fax: [phone]

## 2017-10-05 NOTE — ED Notes (Addendum)
Pt called out requesting suction for mouth, nose, and trach. This EMT noted rales in pt's breathing. Pt also mentioning having asthma and it being in medical record, but this EMT was unable to read handwriting to understand why pt was notifying this EMT of this information. This EMT notified Nikki,RN of pt's request for suction.

## 2017-10-05 NOTE — Progress Notes (Signed)
RT called to pt room for trach suctioning. Pt has rhonchi/diminished BS bilat and non-productive cough. RT trach suctions pt with minimal/small return of thick clear/white sputum. Pt still rhonchi/diminished but will not allow RT to suction any further. RT will continue to monitor. RN made aware of situation with suctioning.

## 2017-10-08 ENCOUNTER — Emergency Department (HOSPITAL_COMMUNITY): Payer: Medicare Other

## 2017-10-08 ENCOUNTER — Encounter (HOSPITAL_COMMUNITY): Payer: Self-pay | Admitting: Emergency Medicine

## 2017-10-08 ENCOUNTER — Emergency Department (HOSPITAL_COMMUNITY)
Admission: EM | Admit: 2017-10-08 | Discharge: 2017-10-08 | Disposition: A | Discharge status: Home / Self Care | Payer: Medicare Other | Attending: Emergency Medicine | Admitting: Emergency Medicine

## 2017-10-08 ENCOUNTER — Other Ambulatory Visit: Payer: Self-pay

## 2017-10-08 DIAGNOSIS — Z8521 Personal history of malignant neoplasm of larynx: Secondary | ICD-10-CM | POA: Diagnosis not present

## 2017-10-08 DIAGNOSIS — Z87891 Personal history of nicotine dependence: Secondary | ICD-10-CM | POA: Diagnosis not present

## 2017-10-08 DIAGNOSIS — Z79899 Other long term (current) drug therapy: Secondary | ICD-10-CM | POA: Insufficient documentation

## 2017-10-08 DIAGNOSIS — R062 Wheezing: Secondary | ICD-10-CM | POA: Diagnosis not present

## 2017-10-08 DIAGNOSIS — J9500 Unspecified tracheostomy complication: Secondary | ICD-10-CM | POA: Insufficient documentation

## 2017-10-08 DIAGNOSIS — J80 Acute respiratory distress syndrome: Secondary | ICD-10-CM | POA: Diagnosis not present

## 2017-10-08 DIAGNOSIS — R000 Tachycardia, unspecified: Secondary | ICD-10-CM | POA: Diagnosis not present

## 2017-10-08 DIAGNOSIS — E119 Type 2 diabetes mellitus without complications: Secondary | ICD-10-CM | POA: Insufficient documentation

## 2017-10-08 DIAGNOSIS — R52 Pain, unspecified: Secondary | ICD-10-CM

## 2017-10-08 DIAGNOSIS — R0602 Shortness of breath: Secondary | ICD-10-CM | POA: Diagnosis not present

## 2017-10-08 DIAGNOSIS — R609 Edema, unspecified: Secondary | ICD-10-CM | POA: Diagnosis not present

## 2017-10-08 DIAGNOSIS — R0689 Other abnormalities of breathing: Secondary | ICD-10-CM | POA: Diagnosis not present

## 2017-10-08 DIAGNOSIS — R079 Chest pain, unspecified: Secondary | ICD-10-CM | POA: Diagnosis not present

## 2017-10-08 DIAGNOSIS — R0600 Dyspnea, unspecified: Secondary | ICD-10-CM | POA: Diagnosis present

## 2017-10-08 DIAGNOSIS — T17998A Other foreign object in respiratory tract, part unspecified causing other injury, initial encounter: Secondary | ICD-10-CM | POA: Diagnosis not present

## 2017-10-08 DIAGNOSIS — R061 Stridor: Secondary | ICD-10-CM | POA: Diagnosis not present

## 2017-10-08 LAB — CBC WITH DIFFERENTIAL/PLATELET
ABS IMMATURE GRANULOCYTES: 0 10*3/uL (ref 0.0–0.1)
Basophils Absolute: 0 10*3/uL (ref 0.0–0.1)
Basophils Relative: 0 %
Eosinophils Absolute: 0 10*3/uL (ref 0.0–0.7)
Eosinophils Relative: 1 %
HEMATOCRIT: 39.3 % (ref 39.0–52.0)
HEMOGLOBIN: 12.2 g/dL — AB (ref 13.0–17.0)
Immature Granulocytes: 1 %
LYMPHS ABS: 0.4 10*3/uL — AB (ref 0.7–4.0)
LYMPHS PCT: 6 %
MCH: 27.5 pg (ref 26.0–34.0)
MCHC: 31 g/dL (ref 30.0–36.0)
MCV: 88.5 fL (ref 78.0–100.0)
MONO ABS: 0.9 10*3/uL (ref 0.1–1.0)
MONOS PCT: 14 %
NEUTROS ABS: 5.3 10*3/uL (ref 1.7–7.7)
Neutrophils Relative %: 78 %
Platelets: 386 10*3/uL (ref 150–400)
RBC: 4.44 MIL/uL (ref 4.22–5.81)
RDW: 13.8 % (ref 11.5–15.5)
WBC: 6.7 10*3/uL (ref 4.0–10.5)

## 2017-10-08 LAB — BASIC METABOLIC PANEL
Anion gap: 10 (ref 5–15)
BUN: 14 mg/dL (ref 8–23)
CHLORIDE: 92 mmol/L — AB (ref 98–111)
CO2: 28 mmol/L (ref 22–32)
Calcium: 9.2 mg/dL (ref 8.9–10.3)
Creatinine, Ser: 0.94 mg/dL (ref 0.61–1.24)
GFR calc Af Amer: >60 mL/min (ref >60)
GFR calc non Af Amer: >60 mL/min (ref >60)
Glucose, Bld: 198 mg/dL — ABNORMAL HIGH (ref 70–99)
Potassium: 4.5 mmol/L (ref 3.5–5.1)
SODIUM: 130 mmol/L — AB (ref 135–145)

## 2017-10-08 LAB — I-STAT TROPONIN, ED: TROPONIN I, POC: 0.01 ng/mL (ref 0.00–0.08)

## 2017-10-08 LAB — BRAIN NATRIURETIC PEPTIDE: B Natriuretic Peptide: 10.9 pg/mL (ref 0.0–100.0)

## 2017-10-08 MED ORDER — IPRATROPIUM-ALBUTEROL 0.5-2.5 (3) MG/3ML IN SOLN
3.0000 mL | Freq: Once | RESPIRATORY_TRACT | Status: AC
  Administered 2017-10-08: 3 mL via RESPIRATORY_TRACT
  Filled 2017-10-08: qty 3

## 2017-10-08 NOTE — ED Provider Notes (Signed)
[facility] EMERGENCY DEPARTMENT Provider Note   CSN: [id number] Arrival date & time: 10/08/17  1832     History   Chief Complaint Chief Complaint  Patient presents with  . Respiratory Distress   5 caveat: Patient is unable to speak because of his tracheostomy HPI Nicholas Cobb is a 67 y.o. male.  HPI Patient presents to the emergency room for evaluation of difficulty breathing.  Patient unfortunately has a history of laryngeal cancer and has a chronic tracheostomy.  Patient has had multiple visits to the ED for issues with his breathing.  Patient was last seen in the emergency room on August 15 for similar event.  Patient was discharged home.  According to the EMS report they were called because patient was having difficulty breathing.  They noted that his oxygen saturation was low.  They suction the patient and placed him on 100% nonrebreather over his tracheostomy and his oxygen saturation has improved.  Patient is unable to answer my questions right now and is asking for something to write on.  We will go ahead and get him think so he can provide additional history Past Medical History:  Diagnosis Date  . Allergy   . Cancer ([place]) 1999   throat cancer  . Cervical spondylolysis 04/30/2011   severe  . Cervical spondylosis 03/25/2011  . Change in voice   . Chronic pain 03/25/2011  . Chronic sinusitis 04/27/2011  . Depression   . Difficulty urinating   . Emphysema 03/25/2011  . Essential hypertension, benign 12/03/2012  . GERD (gastroesophageal reflux disease) 03/25/2011  . Hearing loss   . Impaired glucose tolerance 04/27/2011  . 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 Rash   . Trouble swallowing   . Type II or unspecified type diabetes mellitus without mention of complication, uncontrolled 04/27/2011  . Ulcer   . UTI (lower urinary tract infection)   . Xerostomia 03/25/2011    Patient Active Problem List   Diagnosis Date Noted  . Elevated troponin 07/27/2017    . Hyperkalemia 07/27/2017  . Hypertensive urgency 07/26/2017  . Hypoxia   . Tracheostomy malfunction ([place]) 03/01/2017  . Trachea displaced 12/29/2016  . Other tracheostomy complication ([place]) 56/38/7564  . Radionecrosis   . Tracheostomy care ([place])   . Hypoxemia   . Hemoptysis 01/26/2016  . Throat cancer ([place])   . Controlled diabetes mellitus type 2 with complications ([place])   . Acute on chronic respiratory failure with hypoxia ([place]) 06/14/2015  . Respiratory failure ([place]) 06/13/2015  . Tracheostomy dependence ([place])   . Cellulitis and abscess of head   . Cellulitis of face   . Tracheostomy status ([place])   . Acute sinus infection 10/29/2013  . Rash 10/29/2013  . Hyponatremia 12/03/2012  . Essential hypertension 12/03/2012  . Cervical spondylolysis 04/30/2011  . Chronic sinusitis 04/27/2011  . Diabetes mellitus without complication ([place]) 33/29/5188  . Erectile dysfunction 04/19/2011  . Preventative health care 03/25/2011  . Cervical spondylosis 03/25/2011  . Pulmonary emphysema ([place]) 03/25/2011  . GERD (gastroesophageal reflux disease) 03/25/2011  . Chronic pain 03/25/2011  . 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 Xerostomia 03/25/2011  . Tracheostomy in place [facility]) 02/04/2011  . Laryngeal cancer ([place]) 02/04/2011  . Encounter for PEG (percutaneous endoscopic gastrostomy) ([place]) 02/04/2011  . Attention to G-tube [facility]) 11/07/2010    Past Surgical History:  Procedure Laterality Date  . GASTROSTOMY TUBE PLACEMENT    . IR GENERIC HISTORICAL  04/03/2016   IR CM INJ ANY COLONIC TUBE  W/FLUORO 04/03/2016 [name], [name]  . PEG TUBE PLACEMENT  11/1999  . TRACHEOSTOMY  11/1999  . TRACHEOSTOMY TUBE PLACEMENT  11/24/2016   Procedure: EMERGENT TRACHEOSTOMY CHANGE;  Surgeon: Jerrell Belfast, MD;  Location: [place] [facility];  Service: ENT;;  . TRACHEOSTOMY TUBE PLACEMENT N/A 12/29/2016   Procedure: TRACHEOSTOMY;  Surgeon: Melissa Montane, MD;  Location: [place];  Service: ENT;  Laterality:  N/A;        Home Medications    Prior to Admission medications   Medication Sig Start Date End Date Taking? Authorizing Provider  albuterol (PROVENTIL HFA;VENTOLIN HFA) 108 (90 Base) MCG/ACT inhaler Inhale 2 puffs into the lungs every 4 (four) hours as needed for wheezing or shortness of breath. 03/09/16   [name], NP  AMBULATORY NON FORMULARY MEDICATION Trach(Shiley 6.0 XLT UP uncuffed), Trach Care Kit, Trach holders, Disposable inner cannulas 07/08/15   Gildardo Cranker, DO  betamethasone dipropionate 0.05 % lotion Apply topically 2 (two) times daily. To rash on leg Patient taking differently: Apply 1 application topically 2 (two) times daily as needed (to rash on leg).  10/20/15   Gildardo Cranker, DO  doxycycline (VIBRA-TABS) 100 MG tablet Take 100 mg by mouth 2 (two) times daily. For 10 days    [provider]  IBU 400 MG tablet take 1 tablet by mouth three times a day if needed for pain Patient taking differently: Take 400 mg by mouth 3 (three) times daily as needed for moderate pain.  01/17/17   [name], NP  ipratropium-albuterol (DUONEB) 0.5-2.5 (3) MG/3ML SOLN Take 3 mLs by nebulization every 6 (six) hours as needed (for wheezing or shortness of breath). 08/31/17   Wynona Luna, MD  levofloxacin [facility]) 25 MG/ML solution Take 750 mg by mouth daily. For 4 days    [provider]  losartan (COZAAR) 100 MG tablet Place 1 tablet (100 mg total) into feeding tube daily. 11/27/16   Cherene Altes, MD  naphazoline-pheniramine (NAPHCON-A) 0.025-0.3 % ophthalmic solution Place 2 drops into both eyes 4 (four) times daily as needed for eye irritation or allergies.     [provider]  Nutritional Supplements (FEEDING SUPPLEMENT, JEVITY 1.2 CAL,) LIQD Place 474 mLs into feeding tube every 6 (six) hours. 11/27/16   Cherene Altes, MD  oxymetazoline (12 HOUR NASAL SPRAY) 0.05 % nasal spray Place 2 sprays into the nose 2 (two) times daily as  needed for congestion.     [provider]  predniSONE (DELTASONE) 20 MG tablet Take 40 mg by mouth daily with breakfast.     [provider]  ranitidine (ZANTAC) 150 MG tablet Take 150 mg per tube two times a day 11/27/16   Cherene Altes, MD  sulfamethoxazole-trimethoprim (BACTRIM,SEPTRA) 200-40 MG/5ML suspension Take 20 mLs by mouth 2 (two) times daily. For 5 days.    [provider]  triamcinolone cream (KENALOG) 0.1 % Apply 1 application topically 2 (two) times daily. APPLY TO AFFECTED AREAS Patient taking differently: Apply 1 application topically 2 (two) times daily. TO AFFECTED AREAS 11/27/16   Cherene Altes, MD    Family History Family History  Problem Relation Age of Onset  . Diabetes Mother   . Heart disease Father   . Stroke Other   . Diabetes Other   . Cancer Other        lung cancer    Social History Social History   Tobacco Use  . Smoking status: Former Smoker  Packs/day: 1.00    Years: 23.00    Pack years: 23.00    Last attempt to quit: 11/06/1996    Years since quitting: 20.9  . Smokeless tobacco: Never Used  Substance Use Topics  . Alcohol use: No    Alcohol/week: 0.0 standard drinks  . Drug use: No     Allergies   Oxybutynin chloride and Codeine   Review of Systems Review of Systems  Unable to perform ROS: Other     Physical Exam Updated Vital Signs BP 102/66   Pulse 80   Temp 98.2 F (36.8 C) (Axillary)   Resp (!) 24   Ht 1.676 m ('5\' 6"'$ )   Wt 72 kg   SpO2 98%   BMI 25.62 kg/m   Physical Exam  Constitutional: No distress.  Chronically ill-appearing  HENT:  Head: Normocephalic and atraumatic.  Right Ear: External ear normal.  Left Ear: External ear normal.  Facial edema noted,   Eyes: Conjunctivae are normal. Right eye exhibits no discharge. Left eye exhibits no discharge. No scleral icterus.  Neck: Neck supple. No tracheal deviation present.  Tracheostomy in place  Cardiovascular: Normal  rate, regular rhythm and intact distal pulses.  Pulmonary/Chest: Effort normal. No stridor. No respiratory distress. He has wheezes. He has no rales.  Abdominal: Soft. Bowel sounds are normal. He exhibits no distension. There is no tenderness. There is no rebound and no guarding.  Musculoskeletal: He exhibits no edema or tenderness.  Neurological: He is alert. He has normal strength. No cranial nerve deficit (no facial droop, extraocular movements intact, no slurred speech) or sensory deficit. He exhibits normal muscle tone. He displays no seizure activity. Coordination normal.  Skin: Skin is warm and dry. No rash noted.  Psychiatric: He has a normal mood and affect.  Nursing note and vitals reviewed.    ED Treatments / Results  Labs (all labs ordered are listed, but only abnormal results are displayed) Labs Reviewed  CBC WITH DIFFERENTIAL/PLATELET - Abnormal; Notable for the following components:      Result Value   Hemoglobin 12.2 (*)    Lymphs Abs 0.4 (*)    All other components within normal limits  BASIC METABOLIC PANEL - Abnormal; Notable for the following components:   Sodium 130 (*)    Chloride 92 (*)    Glucose, Bld 198 (*)    All other components within normal limits  BRAIN NATRIURETIC PEPTIDE  I-STAT TROPONIN, ED    EKG EKG Interpretation  Date/Time:  Monday October 08 2017 18:53:17 EDT Ventricular Rate:  103 PR Interval:    QRS Duration: 68 QT Interval:  327 QTC Calculation: 428 R Axis:   71 Text Interpretation:  Sinus tachycardia Right atrial enlargement Baseline wander in lead(s) V6 No significant change since last tracing Confirmed by Dorie Rank [phone]) on 10/08/2017 7:18:38 PM   Radiology Dg Chest Port 1 View  Result Date: 10/08/2017 CLINICAL DATA:  Chest pain EXAM: PORTABLE CHEST 1 VIEW COMPARISON:  10/05/2017 FINDINGS: Bibasilar atelectasis. Heart is normal size. No effusions. No acute bony abnormality. IMPRESSION: Bibasilar atelectasis. Electronically  Signed   By: Rolm Baptise M.D.   On: 10/08/2017 19:38    Procedures Procedures (including critical care time)  Medications Ordered in ED Medications  ipratropium-albuterol (DUONEB) 0.5-2.5 (3) MG/3ML nebulizer solution 3 mL (3 mLs Nebulization Given 10/08/17 1934)     Initial Impression / Assessment and Plan / ED Course  I have reviewed the triage vital signs and the nursing notes.  Pertinent labs & imaging results that were available during my care of the patient were reviewed by me and considered in my medical decision making (see chart for details).  Clinical Course as of Oct 09 2215  Mon Oct 08, 2017  2054 Pt is breathing easily now.  Responded to suctioning.   LAbs are reassuring. Suspect component of mucus plugging.  Difficult to say whether pt feels better, he does not answer my questions although he has been writing answers.   [JK]    Clinical Course User Index [JK] Dorie Rank, MD    Patient's ED work-up is reassuring.  No evidence of pneumonia.  Laboratory tests are unremarkable.  Patient has been breathing more easily.  His oxygenation is stable.  Patient has been having recurrent issues with coming back to the ED.  I certainly have concerns about his ability to care for himself.  Case management has been involved.  [facility] is trying to assist him as well.  Patient is not interested in going to a nursing facility.  I did discuss the findings with the patient and his daughter.  He is ready to be discharged.   Final Clinical Impressions(s) / ED Diagnoses   Final diagnoses:  Tracheostomy complication, unspecified complication type [facility])    ED Discharge Orders    None       Dorie Rank, MD 10/08/17 2220

## 2017-10-08 NOTE — ED Notes (Signed)
Family at bedside. Case management notified and is at bedside.

## 2017-10-08 NOTE — ED Notes (Signed)
plz have patient or RN to call daughter back for some questions:  [phone]

## 2017-10-08 NOTE — ED Triage Notes (Signed)
Per EMS pt coming from home with complaints of no being able to breathe through his trach. Pt was 78% O2 on scene. EMS cleared pts inner cannula with NS and removed a large mucous plug. Pt was placed on NRB and O2 sats now at 100%. Pt not complaining of pain. A&O x4.

## 2017-10-08 NOTE — ED Notes (Signed)
Respiratory at bedside.

## 2017-10-08 NOTE — Discharge Instructions (Addendum)
Follow-up with the tracheostomy clinic, continue your current medications

## 2017-10-08 NOTE — ED Notes (Signed)
This nurse updated pt's family on situation. Pt family stated that they would be here within the next hour.

## 2017-10-09 ENCOUNTER — Other Ambulatory Visit: Payer: Self-pay

## 2017-10-09 NOTE — Patient Outreach (Addendum)
[facility]) Care Management  10/09/2017  Nicholas Cobb Feb 07, 1951 [id number]    Telephone Screen Referral Date : Referral Source: Referral Reason: Insurance:  Outreach attempt 4 #  To patient. HIPAA verified by daughter Tomasa Hose ( on release of information). Spoke with the daughter because her father is aphasic. Daughter completed the screening.    Social: The patient lives alone in the home.alone.  His daughter Elvin So is supportive of her father. He uses scat to go to his appointments.    Conditions: Per chart review the patient has HTN, Trach, Pulmonary Emphysema, GERD, and feeding tube. The daughter is concerned about her father.  She feels that he is at the beginning stages of dementia.  She states that he goes without underwear and will stand in the door way of his home.  If he leaves his residence and comes home he will forget which apartment is his and go to one that has the same letter.  She states that he has begun to lose weight. Advised the daughter that I will make a referral for the patient to have a visual assessment.  Medications: Per daughter the patient is on ten medications.  She states that he is able to pay for his medications.  She was unable to tell me if the patient is taking his medications as prescribed.  Appointments: daughter unable to tell me when his next appointment is with his PCP.  Plan: RN Health Coach will make a referral to [facility] nurse for evaluation.  RN Health Coach will close the case at this time.     Lazaro Arms RN, BSN, [place] Direct Dial:  [phone] Fax: [phone]

## 2017-10-11 ENCOUNTER — Other Ambulatory Visit: Payer: Self-pay

## 2017-10-11 ENCOUNTER — Ambulatory Visit: Payer: Self-pay

## 2017-10-11 DIAGNOSIS — Z87891 Personal history of nicotine dependence: Secondary | ICD-10-CM | POA: Diagnosis not present

## 2017-10-11 DIAGNOSIS — H9319 Tinnitus, unspecified ear: Secondary | ICD-10-CM | POA: Diagnosis not present

## 2017-10-11 DIAGNOSIS — K219 Gastro-esophageal reflux disease without esophagitis: Secondary | ICD-10-CM | POA: Diagnosis not present

## 2017-10-11 DIAGNOSIS — I10 Essential (primary) hypertension: Secondary | ICD-10-CM | POA: Diagnosis not present

## 2017-10-11 DIAGNOSIS — C153 Malignant neoplasm of upper third of esophagus: Secondary | ICD-10-CM | POA: Diagnosis not present

## 2017-10-11 DIAGNOSIS — J439 Emphysema, unspecified: Secondary | ICD-10-CM | POA: Diagnosis not present

## 2017-10-11 DIAGNOSIS — Z430 Encounter for attention to tracheostomy: Secondary | ICD-10-CM | POA: Diagnosis not present

## 2017-10-11 DIAGNOSIS — C329 Malignant neoplasm of larynx, unspecified: Secondary | ICD-10-CM | POA: Diagnosis not present

## 2017-10-11 DIAGNOSIS — J9621 Acute and chronic respiratory failure with hypoxia: Secondary | ICD-10-CM | POA: Diagnosis not present

## 2017-10-11 DIAGNOSIS — E119 Type 2 diabetes mellitus without complications: Secondary | ICD-10-CM | POA: Diagnosis not present

## 2017-10-11 DIAGNOSIS — Z930 Tracheostomy status: Secondary | ICD-10-CM | POA: Diagnosis not present

## 2017-10-11 DIAGNOSIS — Z931 Gastrostomy status: Secondary | ICD-10-CM | POA: Diagnosis not present

## 2017-10-11 DIAGNOSIS — M47812 Spondylosis without myelopathy or radiculopathy, cervical region: Secondary | ICD-10-CM | POA: Diagnosis not present

## 2017-10-11 DIAGNOSIS — R609 Edema, unspecified: Secondary | ICD-10-CM | POA: Diagnosis not present

## 2017-10-11 DIAGNOSIS — M8660 Other chronic osteomyelitis, unspecified site: Secondary | ICD-10-CM | POA: Diagnosis not present

## 2017-10-11 DIAGNOSIS — C154 Malignant neoplasm of middle third of esophagus: Secondary | ICD-10-CM | POA: Diagnosis not present

## 2017-10-11 NOTE — Patient Outreach (Signed)
[place]) Care Management  10/11/2017  Nicholas Cobb 02-28-1950 [id number]    Received call from Tomasa Hose (daughter ). She verified patient  HIPAA.  I informed her that the patient's provider was no longer in network and he was not eligible for the program. I gave her the number to find another primary care physician for her father.  I also notified her that [place] case manager had contacted me to let me know that [facility] nurse would be contacting [name] on Friday.  I advised her to express her concerns with the nurse.  Lazaro Arms RN, BSN, [place] Direct Dial:  [phone]  Fax: [phone]

## 2017-10-11 NOTE — ED Provider Notes (Signed)
[facility] EMERGENCY DEPARTMENT Provider Note   CSN: [id number] Arrival date & time: 10/05/17  2113     History   Chief Complaint Chief Complaint  Patient presents with  . Hypotension    HPI Nicholas Cobb is a 67 y.o. male.  HPI   67 year old male arriving via EMS for evaluation of hypotension.  Reportedly they were called out she is probably having problems with his PEG tube.  He seemed confused on EMS arrival.  He was tachypneic.  Had coarse breath sounds.    He is trach dependent because of history of laryngeal cancer.  He medicates via writing.  He is asking [place] to place a dressing around his PEG tube.  He denies any other complaints to me.  He tells me that he wishes EMS to come to [facility] because "they know what they are doing there."  Past Medical History:  Diagnosis Date  . Allergy   . Cancer ([place]) 1999   throat cancer  . Cervical spondylolysis 04/30/2011   severe  . Cervical spondylosis 03/25/2011  . Change in voice   . Chronic pain 03/25/2011  . Chronic sinusitis 04/27/2011  . Depression   . Difficulty urinating   . Emphysema 03/25/2011  . Essential hypertension, benign 12/03/2012  . GERD (gastroesophageal reflux disease) 03/25/2011  . Hearing loss   . Impaired glucose tolerance 04/27/2011  . 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 Rash   . Trouble swallowing   . Type II or unspecified type diabetes mellitus without mention of complication, uncontrolled 04/27/2011  . Ulcer   . UTI (lower urinary tract infection)   . Xerostomia 03/25/2011    Patient Active Problem List   Diagnosis Date Noted  . Elevated troponin 07/27/2017  . Hyperkalemia 07/27/2017  . Hypertensive urgency 07/26/2017  . Hypoxia   . Tracheostomy malfunction ([place]) 03/01/2017  . Trachea displaced 12/29/2016  . Other tracheostomy complication ([place]) 12/16/1115  . Radionecrosis   . Tracheostomy care ([place])   . Hypoxemia   . Hemoptysis 01/26/2016  . Throat cancer ([place])   .  Controlled diabetes mellitus type 2 with complications ([place])   . Acute on chronic respiratory failure with hypoxia ([place]) 06/14/2015  . Respiratory failure ([place]) 06/13/2015  . Tracheostomy dependence ([place])   . Cellulitis and abscess of head   . Cellulitis of face   . Tracheostomy status ([place])   . Acute sinus infection 10/29/2013  . Rash 10/29/2013  . Hyponatremia 12/03/2012  . Essential hypertension 12/03/2012  . Cervical spondylolysis 04/30/2011  . Chronic sinusitis 04/27/2011  . Diabetes mellitus without complication ([place]) 35/67/0141  . Erectile dysfunction 04/19/2011  . Preventative health care 03/25/2011  . Cervical spondylosis 03/25/2011  . Pulmonary emphysema ([place]) 03/25/2011  . GERD (gastroesophageal reflux disease) 03/25/2011  . Chronic pain 03/25/2011  . 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 Xerostomia 03/25/2011  . Tracheostomy in place [facility]) 02/04/2011  . Laryngeal cancer ([place]) 02/04/2011  . Encounter for PEG (percutaneous endoscopic gastrostomy) ([place]) 02/04/2011  . Attention to G-tube [facility]) 11/07/2010    Past Surgical History:  Procedure Laterality Date  . GASTROSTOMY TUBE PLACEMENT    . IR GENERIC HISTORICAL  04/03/2016   IR CM INJ ANY COLONIC TUBE W/FLUORO 04/03/2016 [name], [name]  . PEG TUBE PLACEMENT  11/1999  . TRACHEOSTOMY  11/1999  . TRACHEOSTOMY TUBE PLACEMENT  11/24/2016   Procedure: EMERGENT TRACHEOSTOMY CHANGE;  Surgeon: Jerrell Belfast, MD;  Location: [place];  Service: ENT;;  .  TRACHEOSTOMY TUBE PLACEMENT N/A 12/29/2016   Procedure: TRACHEOSTOMY;  Surgeon: Melissa Montane, MD;  Location: [place];  Service: ENT;  Laterality: N/A;        Home Medications    Prior to Admission medications   Medication Sig Start Date End Date Taking? Authorizing Provider  albuterol (PROVENTIL HFA;VENTOLIN HFA) 108 (90 Base) MCG/ACT inhaler Inhale 2 puffs into the lungs every 4 (four) hours as needed for wheezing or shortness of breath. 03/09/16   [name], NP  AMBULATORY NON FORMULARY MEDICATION Trach([place] 6.0 XLT UP uncuffed), Trach Care Kit, Trach holders, Disposable inner cannulas 07/08/15   Gildardo Cranker, DO  betamethasone dipropionate 0.05 % lotion Apply topically 2 (two) times daily. To rash on leg Patient taking differently: Apply 1 application topically 2 (two) times daily as needed (to rash on leg).  10/20/15   Gildardo Cranker, DO  doxycycline (VIBRA-TABS) 100 MG tablet Take 100 mg by mouth 2 (two) times daily. For 10 days    [provider]  IBU 400 MG tablet take 1 tablet by mouth three times a day if needed for pain Patient taking differently: Take 400 mg by mouth 3 (three) times daily as needed for moderate pain.  01/17/17   [name], NP  ipratropium-albuterol (DUONEB) 0.5-2.5 (3) MG/3ML SOLN Take 3 mLs by nebulization every 6 (six) hours as needed (for wheezing or shortness of breath). 08/31/17   Wynona Luna, MD  levofloxacin [facility]) 25 MG/ML solution Take 750 mg by mouth daily. For 4 days    [provider]  losartan (COZAAR) 100 MG tablet Place 1 tablet (100 mg total) into feeding tube daily. 11/27/16   Cherene Altes, MD  naphazoline-pheniramine (NAPHCON-A) 0.025-0.3 % ophthalmic solution Place 2 drops into both eyes 4 (four) times daily as needed for eye irritation or allergies.     [provider]  Nutritional Supplements (FEEDING SUPPLEMENT, JEVITY 1.2 CAL,) LIQD Place 474 mLs into feeding tube every 6 (six) hours. 11/27/16   Cherene Altes, MD  oxymetazoline (12 HOUR NASAL SPRAY) 0.05 % nasal spray Place 2 sprays into the nose 2 (two) times daily as needed for congestion.     [provider]  predniSONE (DELTASONE) 20 MG tablet Take 40 mg by mouth daily with breakfast.     [provider]  ranitidine (ZANTAC) 150 MG tablet Take 150 mg per tube two times a day 11/27/16   Cherene Altes, MD  sulfamethoxazole-trimethoprim (BACTRIM,SEPTRA) 200-40  MG/5ML suspension Take 20 mLs by mouth 2 (two) times daily. For 5 days.    [provider]  triamcinolone cream (KENALOG) 0.1 % Apply 1 application topically 2 (two) times daily. APPLY TO AFFECTED AREAS Patient taking differently: Apply 1 application topically 2 (two) times daily. TO AFFECTED AREAS 11/27/16   Cherene Altes, MD    Family History Family History  Problem Relation Age of Onset  . Diabetes Mother   . Heart disease Father   . Stroke Other   . Diabetes Other   . Cancer Other        lung cancer    Social History Social History   Tobacco Use  . Smoking status: Former Smoker    Packs/day: 1.00    Years: 23.00    Pack years: 23.00    Last attempt to quit: 11/06/1996    Years since quitting: 20.9  . Smokeless tobacco: Never Used  Substance Use Topics  . Alcohol use: No  Alcohol/week: 0.0 standard drinks  . Drug use: No     Allergies   Oxybutynin chloride and Codeine   Review of Systems Review of Systems  All systems reviewed and negative, other than as noted in HPI.  Physical Exam Updated Vital Signs BP (!) 142/82   Pulse 96   Temp 97.8 F (36.6 C) (Axillary)   Resp (!) 31   Ht _0  (1.676 m)   Wt 72 kg   SpO2 100%   BMI 25.62 kg/m   Physical Exam  Constitutional: He appears well-developed and well-nourished. No distress.  HENT:  Head: Normocephalic and atraumatic.  Eyes: Conjunctivae are normal. Right eye exhibits no discharge. Left eye exhibits no discharge.  Neck: Neck supple.  Cardiovascular: Normal rate, regular rhythm and normal heart sounds. Exam reveals no gallop and no friction rub.  No murmur heard. Pulmonary/Chest: No respiratory distress.  Tracheostomy.  A lot of upper airway noise.  Much improved after suctioning.  Abdominal: Soft. He exhibits no distension. There is no tenderness.  PEG tube with some superficial excoriation around the insertion site.  No cellulitis or abscess.  No drainage.  Abdomen is soft.    Musculoskeletal: He exhibits no edema or tenderness.  Neurological: He is alert.  Skin: Skin is warm and dry.  Psychiatric: He has a normal mood and affect. His behavior is normal. Thought content normal.  Nursing note and vitals reviewed.    ED Treatments / Results  Labs (all labs ordered are listed, but only abnormal results are displayed) Labs Reviewed  CBC - Abnormal; Notable for the following components:      Result Value   RBC 4.03 (*)    Hemoglobin 11.1 (*)    HCT 35.8 (*)    Platelets 404 (*)    All other components within normal limits  BASIC METABOLIC PANEL - Abnormal; Notable for the following components:   Sodium 130 (*)    Potassium 5.6 (*)    Chloride 93 (*)    All other components within normal limits  BRAIN NATRIURETIC PEPTIDE    EKG None  Radiology No results found.  Procedures Procedures (including critical care time)  Medications Ordered in ED Medications - No data to display   Initial Impression / Assessment and Plan / ED Course  I have reviewed the triage vital signs and the nursing notes.  Pertinent labs & imaging results that were available during my care of the patient were reviewed by me and considered in my medical decision making (see chart for details).  I have reviewed the triage vital signs and the nursing notes. Prior records were reviewed for additional information.    Pertinent labs & imaging results that were available during my care of the patient were reviewed by me and considered in my medical decision making (see chart for details).   67 year old male brought by EMS after he was requiring requesting evaluation of his gastric tube site.  There is some excoriation/maceration around the insertion site.  Seems consistent with chronically being wet.  A clean dressing was applied.  No overt signs of infection.  His abdominal exam is otherwise reassuring.  Reportedly hypotensive by EMS.  Normotensive in the emergency room without any  significant preceding intervention.  His tracheostomy was suctioned while he was in the ED.  Shortly after this requested to leave prior to his work-up being completed.  He was in no acute distress.  We arrange transportation home form via taxi.  Final Clinical Impressions(s) /  ED Diagnoses   Final diagnoses:  Pain around percutaneous endoscopic gastrostomy (PEG) tube site, initial encounter  Tracheostomy care [facility])    ED Discharge Orders    None       Virgel Manifold, MD 10/11/17 1338

## 2017-10-12 ENCOUNTER — Emergency Department (HOSPITAL_COMMUNITY)
Admission: EM | Admit: 2017-10-12 | Discharge: 2017-10-12 | Disposition: A | Discharge status: Home / Self Care | Payer: Medicare Other | Attending: Emergency Medicine | Admitting: Emergency Medicine

## 2017-10-12 ENCOUNTER — Encounter (HOSPITAL_COMMUNITY): Payer: Self-pay

## 2017-10-12 DIAGNOSIS — Z5321 Procedure and treatment not carried out due to patient leaving prior to being seen by health care provider: Secondary | ICD-10-CM | POA: Insufficient documentation

## 2017-10-12 DIAGNOSIS — R600 Localized edema: Secondary | ICD-10-CM | POA: Insufficient documentation

## 2017-10-12 LAB — CBC WITH DIFFERENTIAL/PLATELET
ABS IMMATURE GRANULOCYTES: 0 10*3/uL (ref 0.0–0.1)
BASOS ABS: 0 10*3/uL (ref 0.0–0.1)
Basophils Relative: 0 %
EOS PCT: 1 %
Eosinophils Absolute: 0.1 10*3/uL (ref 0.0–0.7)
HEMATOCRIT: 35.5 % — AB (ref 39.0–52.0)
HEMOGLOBIN: 11 g/dL — AB (ref 13.0–17.0)
Immature Granulocytes: 0 %
LYMPHS ABS: 1.1 10*3/uL (ref 0.7–4.0)
LYMPHS PCT: 17 %
MCH: 27.6 pg (ref 26.0–34.0)
MCHC: 31 g/dL (ref 30.0–36.0)
MCV: 89 fL (ref 78.0–100.0)
MONO ABS: 1.2 10*3/uL — AB (ref 0.1–1.0)
Monocytes Relative: 18 %
NEUTROS ABS: 4.1 10*3/uL (ref 1.7–7.7)
Neutrophils Relative %: 64 %
Platelets: 363 10*3/uL (ref 150–400)
RBC: 3.99 MIL/uL — ABNORMAL LOW (ref 4.22–5.81)
RDW: 13.8 % (ref 11.5–15.5)
WBC: 6.6 10*3/uL (ref 4.0–10.5)

## 2017-10-12 LAB — BASIC METABOLIC PANEL
Anion gap: 9 (ref 5–15)
BUN: 14 mg/dL (ref 8–23)
CHLORIDE: 94 mmol/L — AB (ref 98–111)
CO2: 28 mmol/L (ref 22–32)
CREATININE: 0.9 mg/dL (ref 0.61–1.24)
Calcium: 9.1 mg/dL (ref 8.9–10.3)
GFR calc Af Amer: >60 mL/min (ref >60)
GFR calc non Af Amer: >60 mL/min (ref >60)
GLUCOSE: 101 mg/dL — AB (ref 70–99)
POTASSIUM: 4.5 mmol/L (ref 3.5–5.1)
SODIUM: 131 mmol/L — AB (ref 135–145)

## 2017-10-12 NOTE — ED Notes (Signed)
Pt seen ambulating to the exit and getting into Taxi cab.

## 2017-10-12 NOTE — ED Triage Notes (Signed)
Pt presents with continued facial edema, reports he was here 2 days ago for same thing, unable to determine if condition is worse, pt does not talk.

## 2017-10-15 DIAGNOSIS — I499 Cardiac arrhythmia, unspecified: Secondary | ICD-10-CM | POA: Diagnosis not present

## 2017-10-15 DIAGNOSIS — R404 Transient alteration of awareness: Secondary | ICD-10-CM | POA: Diagnosis not present

## 2017-10-15 DIAGNOSIS — R4020 Unspecified coma: Secondary | ICD-10-CM | POA: Diagnosis not present

## 2017-11-13 DIAGNOSIS — 419620001 Death: Secondary | SNOMED CT | POA: Diagnosis not present

## 2017-11-13 DEATH — deceased

## 2018-06-13 IMAGING — DX DG CHEST 1V PORT
1 series · 1 of 1 positions shown · non-contrast
Comparison: Chest radiograph 01/30/2016

CLINICAL DATA: Tracheostomy replacement

EXAM:
PORTABLE CHEST 1 VIEW

[chest ap]
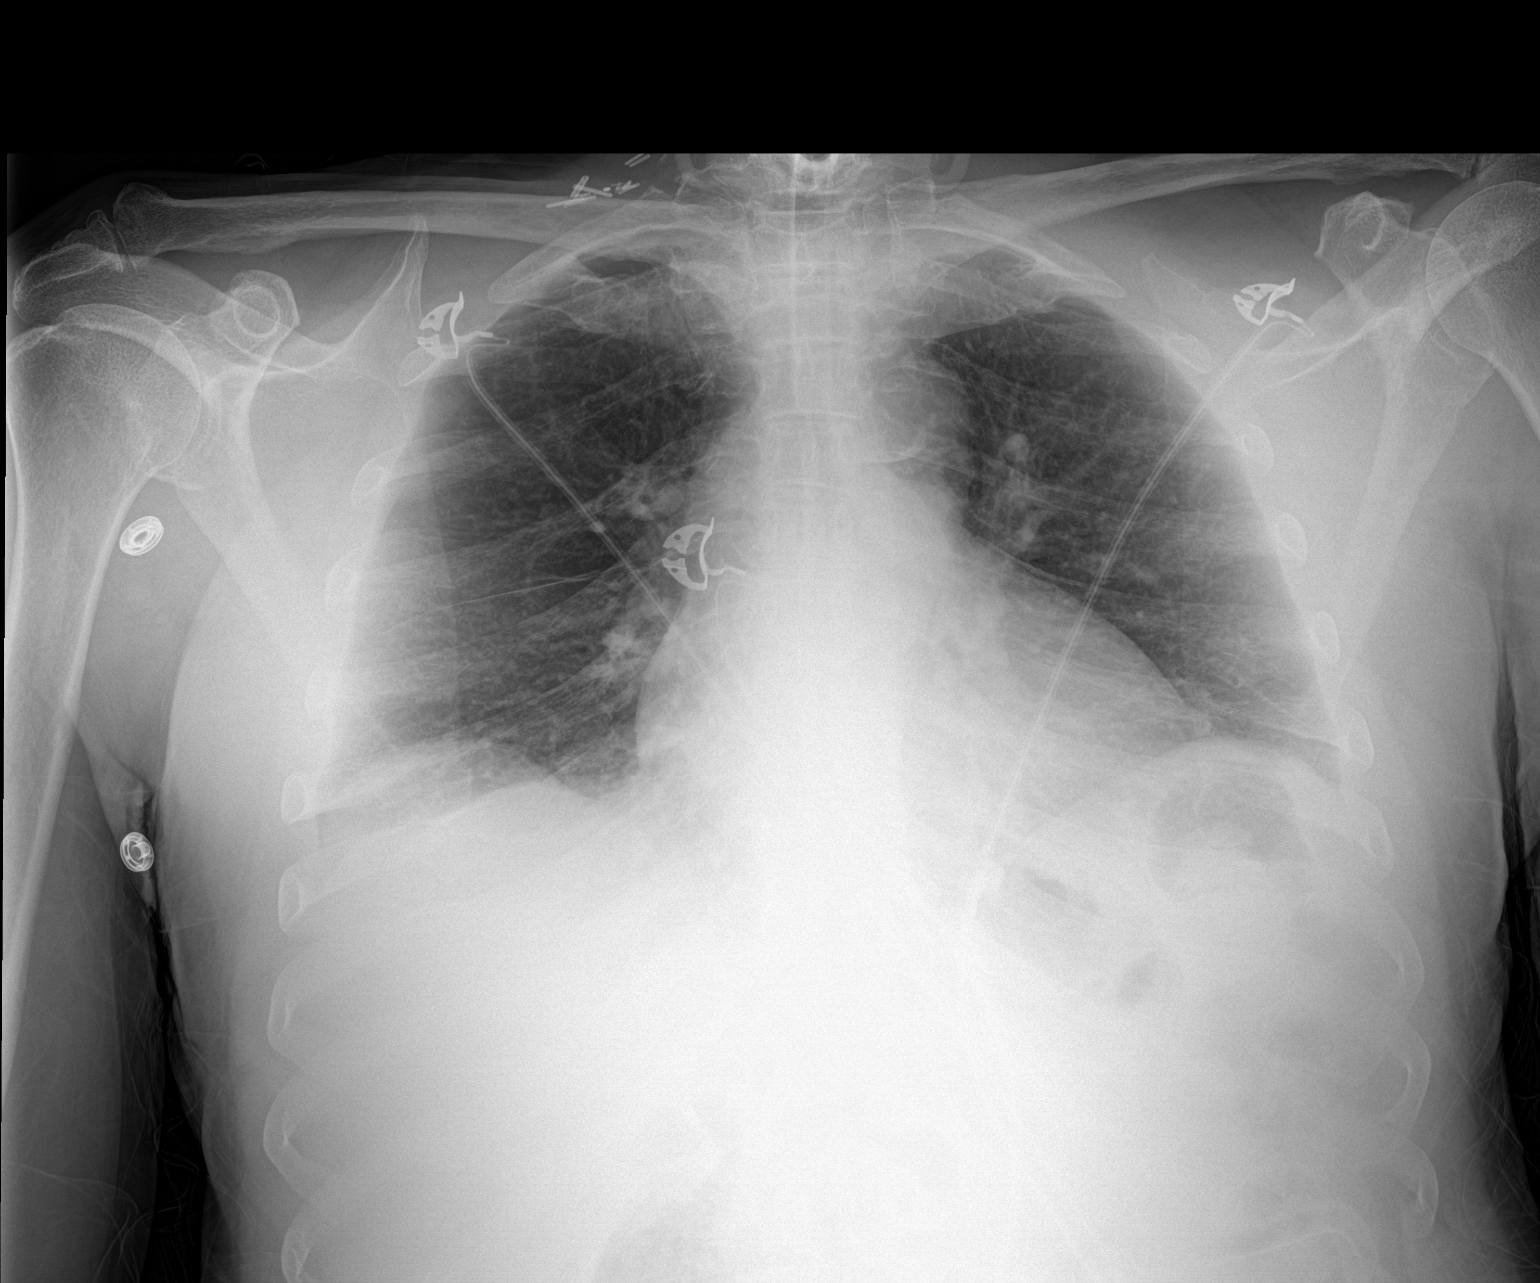

[1 of 1 positions shown; findings below may reference images not displayed]

FINDINGS: The tip of the tracheostomy tube is approximately 3.5 cm above the
inferior margin of the carina. There is shallow lung inflation with
bibasilar atelectasis. No focal airspace consolidation or pulmonary
edema.
IMPRESSION: Tracheostomy tube tip 3.5 cm above the inferior margin of the
carina.

## 2018-08-04 IMAGING — CR DG CHEST 1V PORT
1 series · 1 of 1 positions shown · non-contrast
Comparison: 02/25/2016

CLINICAL DATA: Tracheostomy exchange.  Shortness of breath.

EXAM:
PORTABLE CHEST 1 VIEW

[AP]
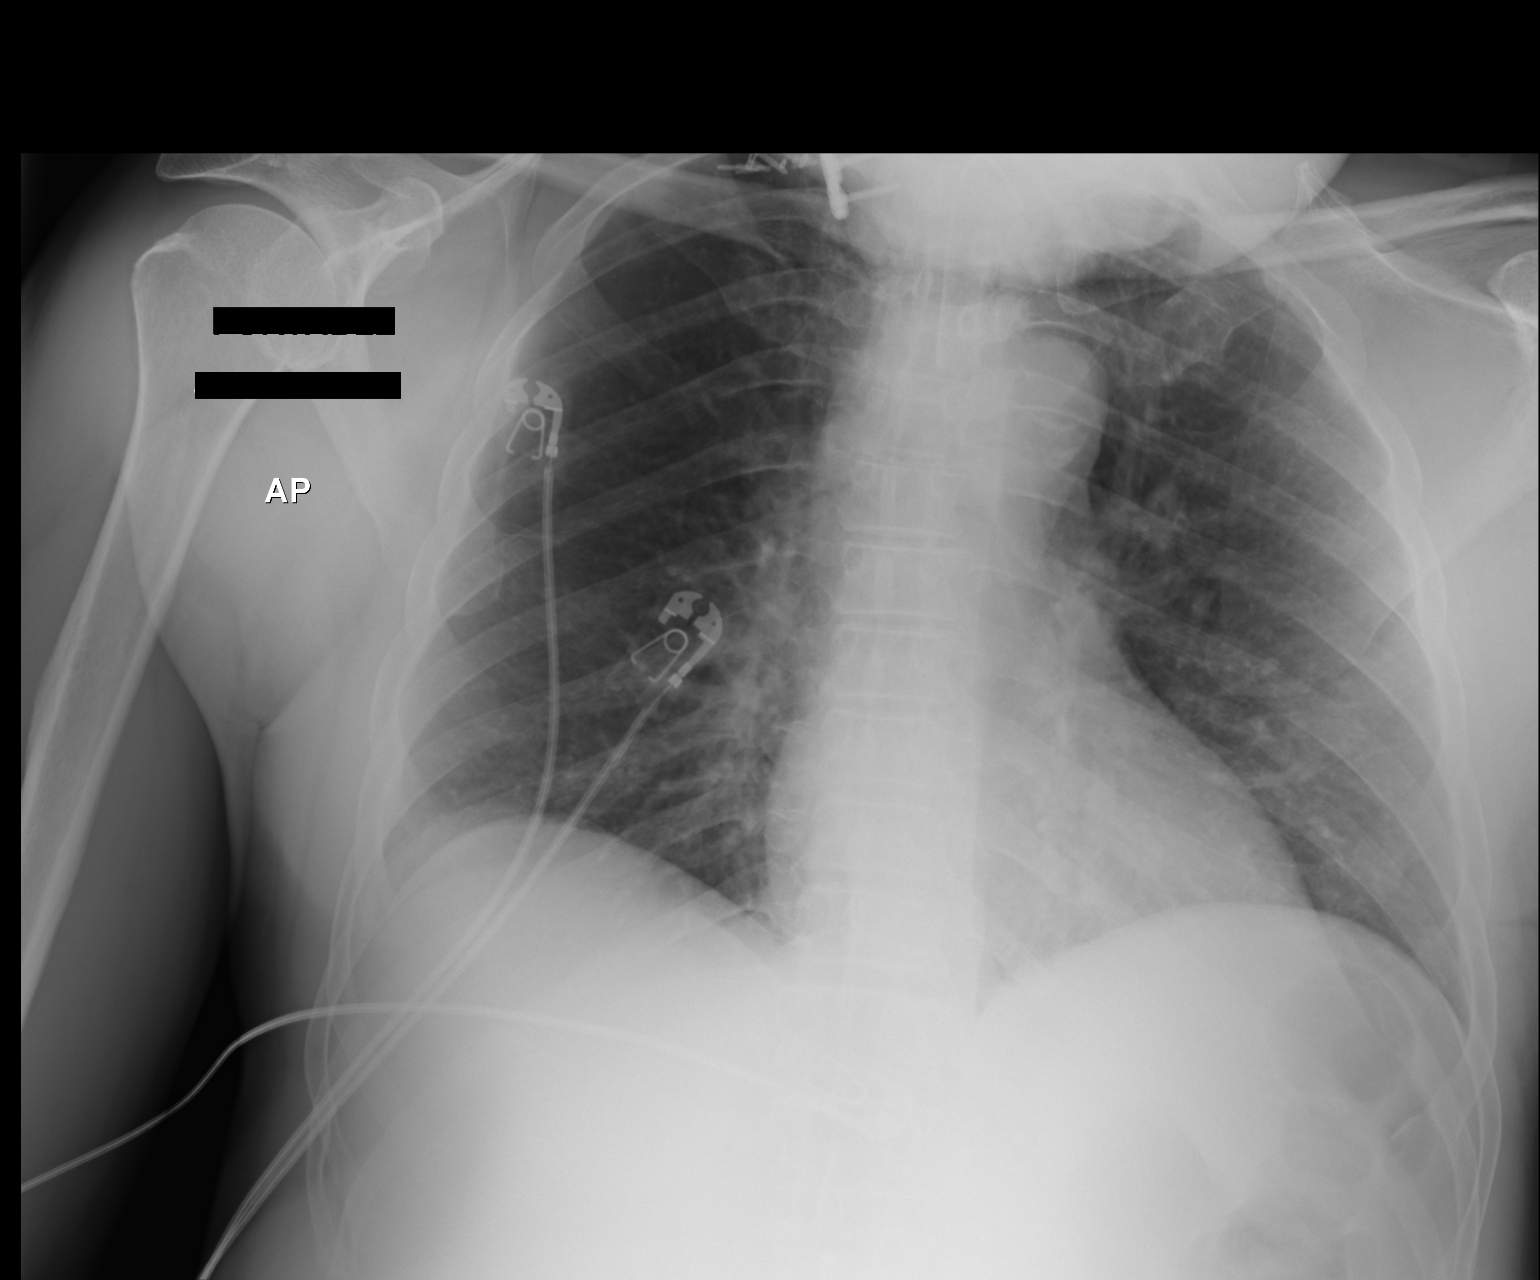

[1 of 1 positions shown; findings below may reference images not displayed]

FINDINGS: Tracheostomy with tip measuring 4.6 cm above the carina. Shallow
inspiration. Heart size and pulmonary vascularity are normal. No
focal airspace disease or consolidation. No blunting of costophrenic
angles. Calcification of the aorta. Postoperative changes in the
right mandible.
IMPRESSION: Tracheostomy with tip measuring 4.6 cm above the carina. Shallow
inspiration. No evidence of active pulmonary disease.

## 2019-07-08 IMAGING — DX DG CHEST 1V PORT
1 series · 1 of 1 positions shown · non-contrast
Comparison: 12/29/2016 chest radiograph.

CLINICAL DATA: Status post complicated tracheostomy change

EXAM:
PORTABLE CHEST 1 VIEW

[chest ap]
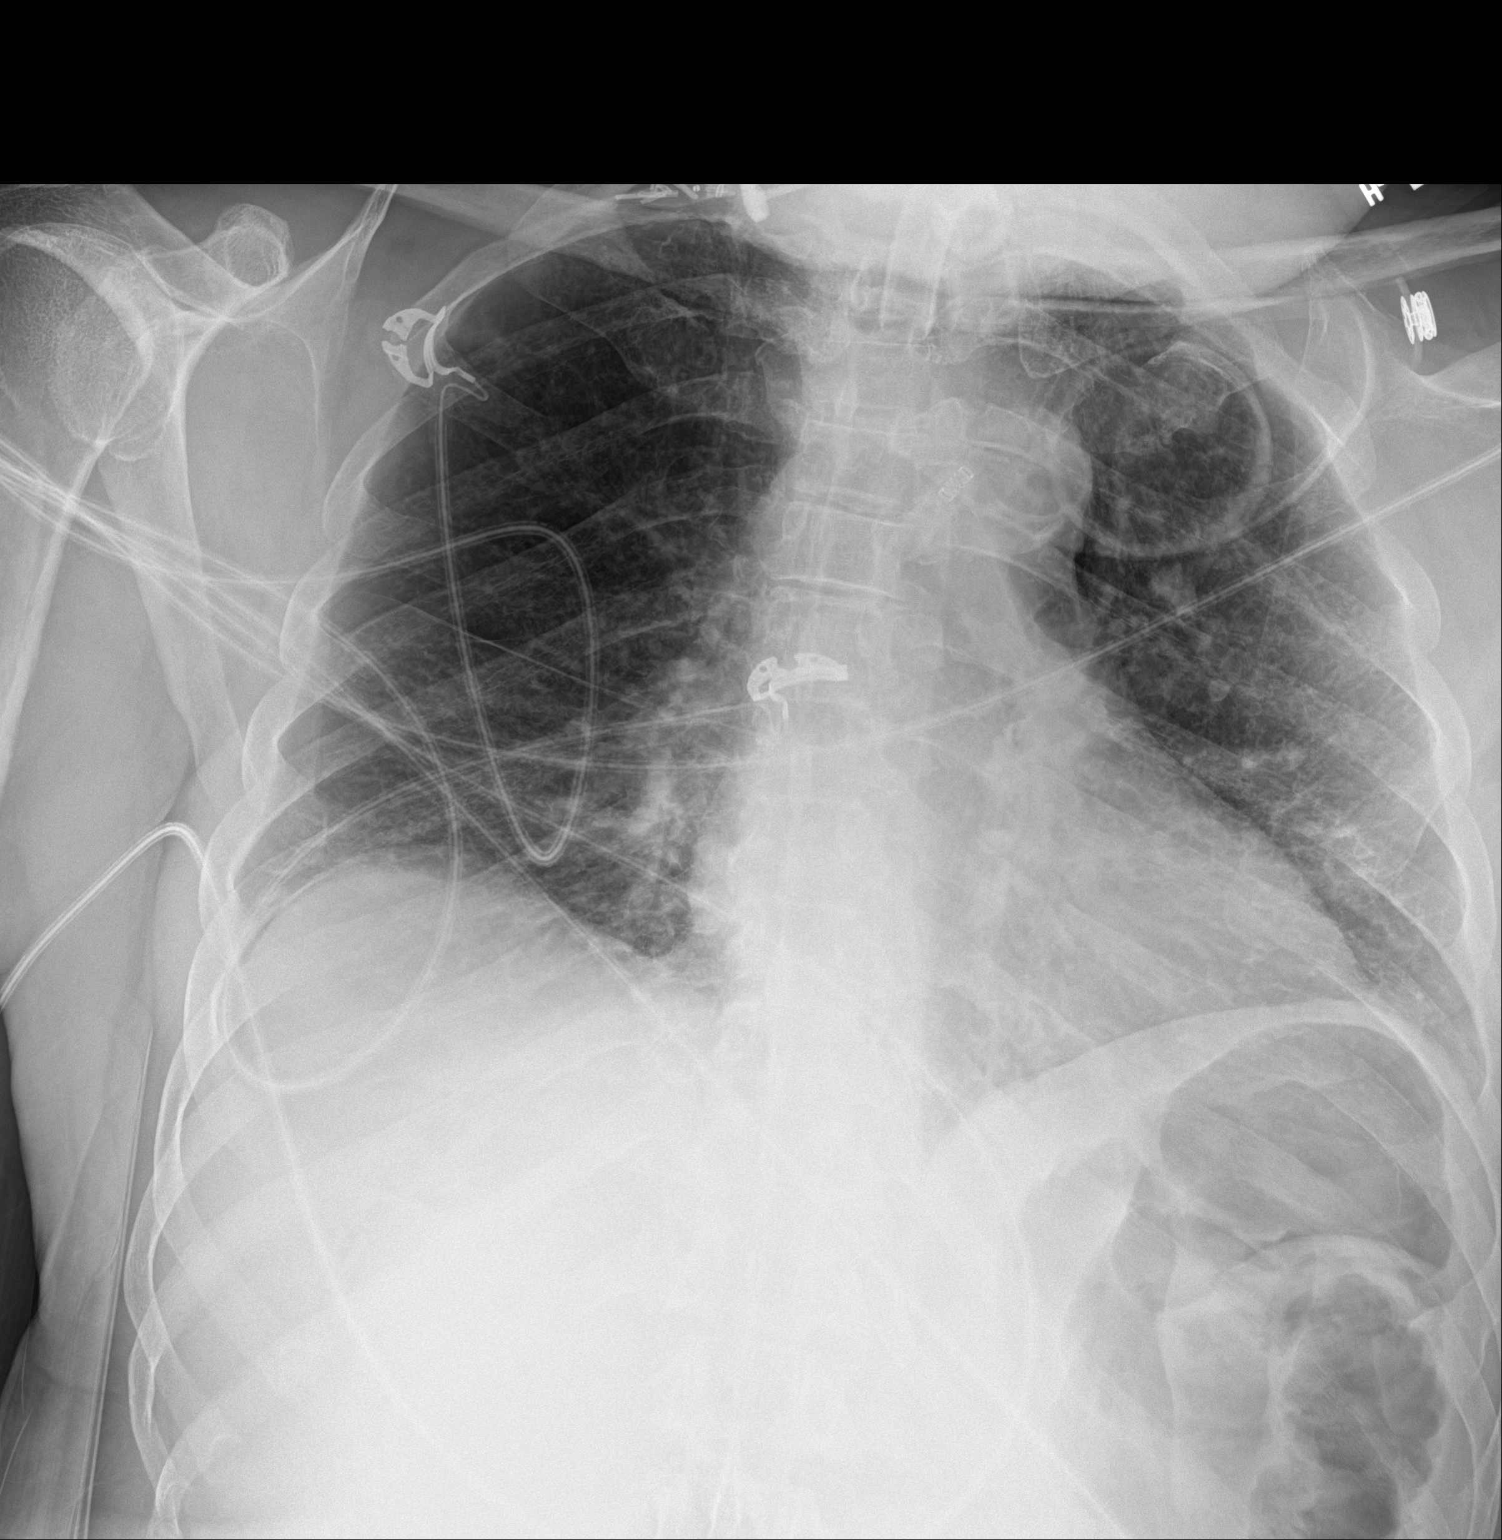

[1 of 1 positions shown; findings below may reference images not displayed]

FINDINGS: Tracheostomy tube tip overlies the tracheal air column at the
thoracic inlet. Partially visualized surgical plate, screws and
clips in the right lower neck. Stable cardiomediastinal silhouette
with normal heart size and aortic atherosclerosis. No pneumothorax.
No pleural effusion. No pulmonary edema. Mild bibasilar scarring
versus atelectasis. No acute consolidative airspace disease.
IMPRESSION: 1. Tracheostomy tube appears well-positioned.
2. Mild bibasilar scarring versus atelectasis.

## 2019-11-06 IMAGING — CR DG CHEST 1V PORT
1 series · 1 of 1 positions shown · non-contrast
Comparison: 03/04/2017

CLINICAL DATA: Shortness of Breath

EXAM:
PORTABLE CHEST 1 VIEW

[AP]
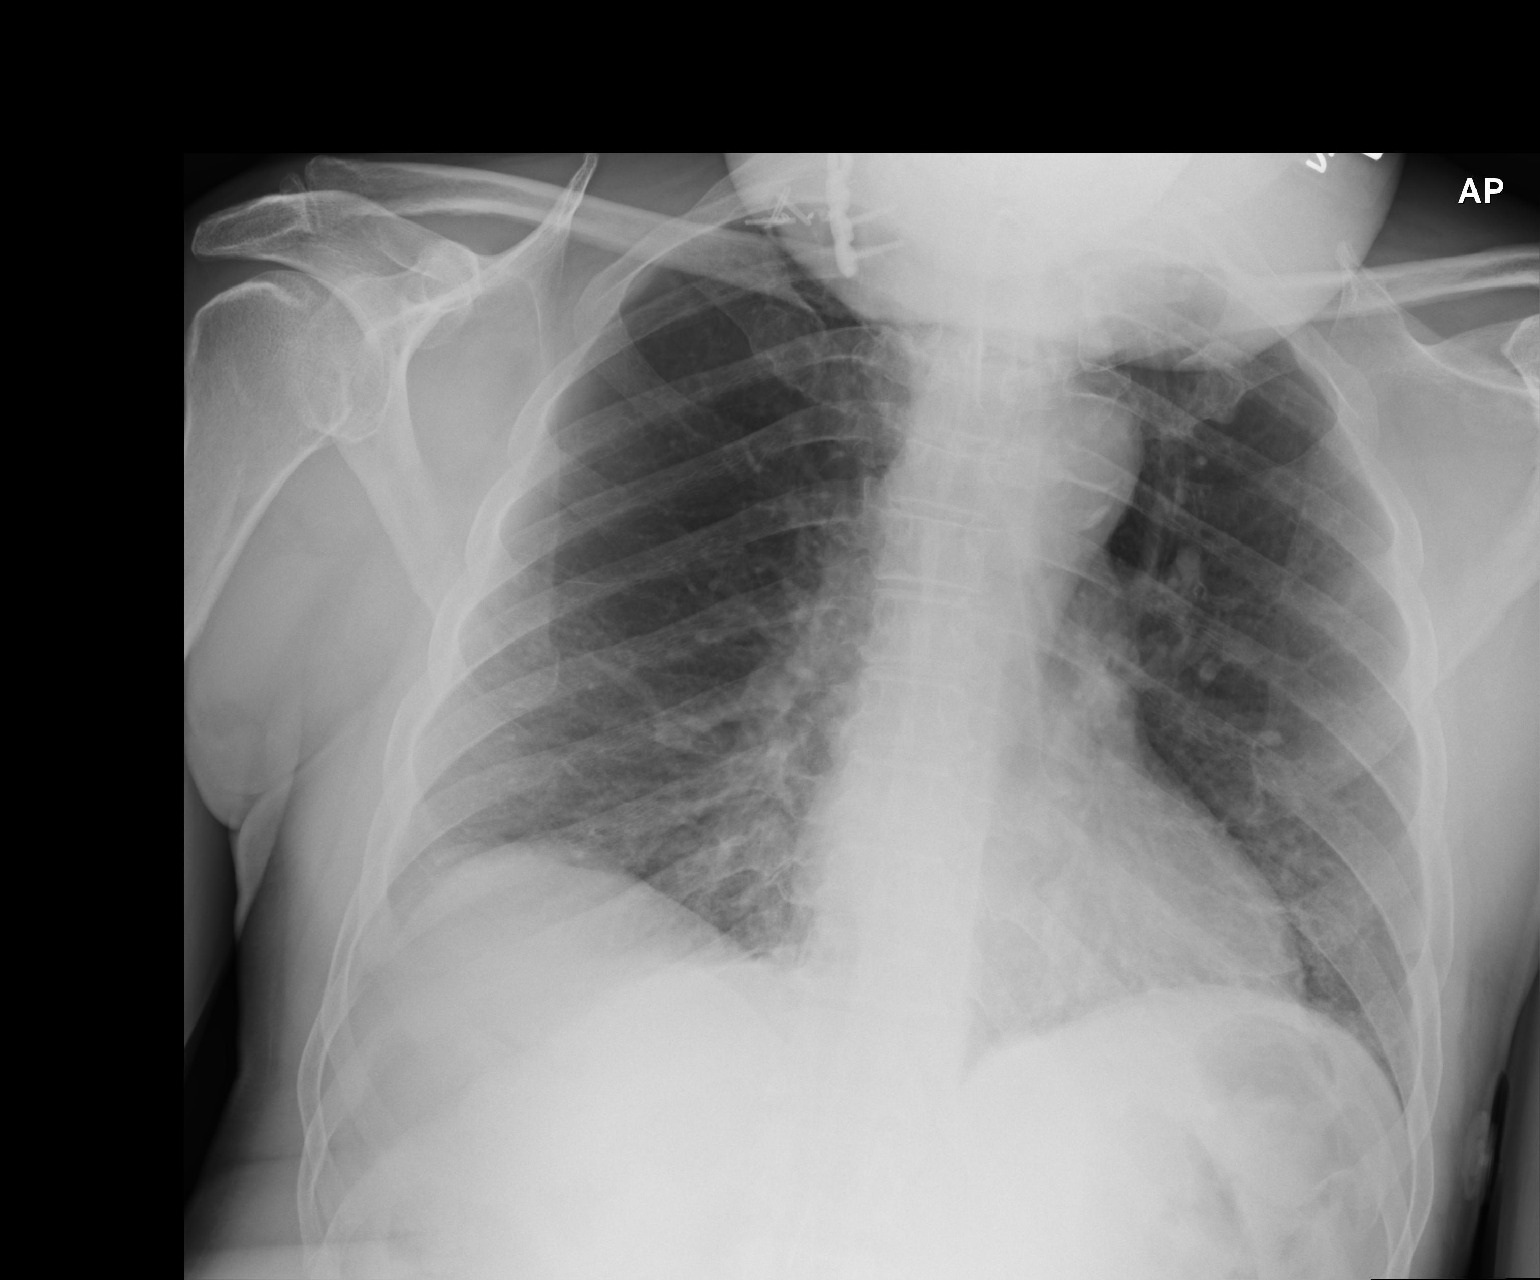

[1 of 1 positions shown; findings below may reference images not displayed]

FINDINGS: Bibasilar atelectasis. Heart is normal size. No effusions or acute
bony abnormality.
IMPRESSION: Bibasilar atelectasis.
